# Patient Record
Sex: Female | Born: 1945 | Race: White | Hispanic: No | Smoking: Former smoker
Health system: Southern US, Community
[De-identification: ages and names within clinical notes are randomized; demographics above are authoritative.]

## PROBLEM LIST (undated history)

## (undated) DIAGNOSIS — G3184 Mild cognitive impairment, so stated: Secondary | ICD-10-CM

## (undated) DIAGNOSIS — C801 Malignant (primary) neoplasm, unspecified: Secondary | ICD-10-CM

## (undated) DIAGNOSIS — E785 Hyperlipidemia, unspecified: Secondary | ICD-10-CM

## (undated) DIAGNOSIS — I10 Essential (primary) hypertension: Secondary | ICD-10-CM

## (undated) DIAGNOSIS — F209 Schizophrenia, unspecified: Secondary | ICD-10-CM

## (undated) HISTORY — DX: Essential (primary) hypertension: I10

## (undated) HISTORY — DX: Hyperlipidemia, unspecified: E78.5

## (undated) HISTORY — DX: Mild cognitive impairment of uncertain or unknown etiology: G31.84

## (undated) HISTORY — PX: MASTECTOMY: SHX3

## (undated) HISTORY — DX: Malignant (primary) neoplasm, unspecified: C80.1

## (undated) HISTORY — DX: Schizophrenia, unspecified: F20.9

## (undated) HISTORY — PX: TOOTH EXTRACTION: SUR596

---

## 2009-04-20 HISTORY — PX: BREAST SURGERY: SHX581

## 2009-06-01 ENCOUNTER — Inpatient Hospital Stay (HOSPITAL_COMMUNITY): Admission: EM | Admit: 2009-06-01 | Discharge: 2009-06-11 | Payer: Self-pay | Admitting: Emergency Medicine

## 2009-06-01 ENCOUNTER — Encounter: Payer: Self-pay | Admitting: Cardiology

## 2009-06-01 ENCOUNTER — Ambulatory Visit: Payer: Self-pay | Admitting: Internal Medicine

## 2009-06-01 ENCOUNTER — Ambulatory Visit: Payer: Self-pay | Admitting: Emergency Medicine

## 2009-06-03 ENCOUNTER — Encounter: Payer: Self-pay | Admitting: Emergency Medicine

## 2009-06-18 ENCOUNTER — Ambulatory Visit: Payer: Self-pay | Admitting: Pulmonary Disease

## 2009-06-18 DIAGNOSIS — J9600 Acute respiratory failure, unspecified whether with hypoxia or hypercapnia: Secondary | ICD-10-CM | POA: Insufficient documentation

## 2009-06-18 DIAGNOSIS — F209 Schizophrenia, unspecified: Secondary | ICD-10-CM | POA: Insufficient documentation

## 2009-06-18 DIAGNOSIS — E119 Type 2 diabetes mellitus without complications: Secondary | ICD-10-CM | POA: Insufficient documentation

## 2009-06-18 DIAGNOSIS — R0989 Other specified symptoms and signs involving the circulatory and respiratory systems: Secondary | ICD-10-CM | POA: Insufficient documentation

## 2009-06-18 DIAGNOSIS — R0609 Other forms of dyspnea: Secondary | ICD-10-CM

## 2009-06-18 DIAGNOSIS — I219 Acute myocardial infarction, unspecified: Secondary | ICD-10-CM | POA: Insufficient documentation

## 2009-06-18 DIAGNOSIS — I10 Essential (primary) hypertension: Secondary | ICD-10-CM | POA: Insufficient documentation

## 2009-06-18 DIAGNOSIS — E785 Hyperlipidemia, unspecified: Secondary | ICD-10-CM | POA: Insufficient documentation

## 2009-06-20 ENCOUNTER — Telehealth (INDEPENDENT_AMBULATORY_CARE_PROVIDER_SITE_OTHER): Payer: Self-pay | Admitting: *Deleted

## 2009-06-28 ENCOUNTER — Encounter: Payer: Self-pay | Admitting: Cardiology

## 2009-07-01 ENCOUNTER — Encounter: Payer: Self-pay | Admitting: Emergency Medicine

## 2009-07-10 DIAGNOSIS — I5032 Chronic diastolic (congestive) heart failure: Secondary | ICD-10-CM | POA: Insufficient documentation

## 2009-07-11 ENCOUNTER — Ambulatory Visit: Payer: Self-pay | Admitting: Pulmonary Disease

## 2009-07-11 ENCOUNTER — Ambulatory Visit: Payer: Self-pay | Admitting: Cardiology

## 2009-08-26 ENCOUNTER — Telehealth (INDEPENDENT_AMBULATORY_CARE_PROVIDER_SITE_OTHER): Payer: Self-pay | Admitting: Radiology

## 2009-08-27 ENCOUNTER — Encounter (HOSPITAL_COMMUNITY): Admission: RE | Admit: 2009-08-27 | Discharge: 2009-10-18 | Payer: Self-pay | Admitting: Cardiology

## 2009-08-27 ENCOUNTER — Ambulatory Visit: Payer: Self-pay

## 2009-08-27 ENCOUNTER — Ambulatory Visit: Payer: Self-pay | Admitting: Cardiology

## 2009-09-09 ENCOUNTER — Encounter: Payer: Self-pay | Admitting: Emergency Medicine

## 2009-11-11 ENCOUNTER — Ambulatory Visit: Payer: Self-pay | Admitting: Oncology

## 2009-11-14 ENCOUNTER — Ambulatory Visit (HOSPITAL_COMMUNITY): Admission: RE | Admit: 2009-11-14 | Discharge: 2009-11-14 | Payer: Self-pay | Admitting: Radiology

## 2009-11-20 LAB — COMPREHENSIVE METABOLIC PANEL
ALT: 25 U/L (ref 0–35)
AST: 25 U/L (ref 0–37)
Albumin: 4.2 g/dL (ref 3.5–5.2)
Alkaline Phosphatase: 51 U/L (ref 39–117)
BUN: 17 mg/dL (ref 6–23)
CO2: 26 mEq/L (ref 19–32)
Calcium: 9.4 mg/dL (ref 8.4–10.5)
Chloride: 102 mEq/L (ref 96–112)
Creatinine, Ser: 0.76 mg/dL (ref 0.40–1.20)
Glucose, Bld: 100 mg/dL — ABNORMAL HIGH (ref 70–99)
Potassium: 5 mEq/L (ref 3.5–5.3)
Sodium: 139 mEq/L (ref 135–145)
Total Bilirubin: 0.3 mg/dL (ref 0.3–1.2)
Total Protein: 6.1 g/dL (ref 6.0–8.3)

## 2009-11-20 LAB — CBC WITH DIFFERENTIAL/PLATELET
BASO%: 0.3 % (ref 0.0–2.0)
Basophils Absolute: 0 10*3/uL (ref 0.0–0.1)
EOS%: 0.8 % (ref 0.0–7.0)
Eosinophils Absolute: 0 10*3/uL (ref 0.0–0.5)
HCT: 36.1 % (ref 34.8–46.6)
HGB: 12.3 g/dL (ref 11.6–15.9)
LYMPH%: 34.7 % (ref 14.0–49.7)
MCH: 30.6 pg (ref 25.1–34.0)
MCHC: 34.2 g/dL (ref 31.5–36.0)
MCV: 89.5 fL (ref 79.5–101.0)
MONO#: 0.3 10*3/uL (ref 0.1–0.9)
MONO%: 6.4 % (ref 0.0–14.0)
NEUT#: 2.8 10*3/uL (ref 1.5–6.5)
NEUT%: 57.8 % (ref 38.4–76.8)
Platelets: 190 10*3/uL (ref 145–400)
RBC: 4.03 10*6/uL (ref 3.70–5.45)
RDW: 13.2 % (ref 11.2–14.5)
WBC: 4.8 10*3/uL (ref 3.9–10.3)
lymph#: 1.7 10*3/uL (ref 0.9–3.3)

## 2009-11-20 LAB — CANCER ANTIGEN 27.29: CA 27.29: 32 U/mL (ref 0–39)

## 2009-12-06 ENCOUNTER — Telehealth (INDEPENDENT_AMBULATORY_CARE_PROVIDER_SITE_OTHER): Payer: Self-pay | Admitting: *Deleted

## 2009-12-09 ENCOUNTER — Encounter (INDEPENDENT_AMBULATORY_CARE_PROVIDER_SITE_OTHER): Payer: Self-pay | Admitting: General Surgery

## 2009-12-09 ENCOUNTER — Ambulatory Visit (HOSPITAL_COMMUNITY): Admission: RE | Admit: 2009-12-09 | Discharge: 2009-12-10 | Payer: Self-pay | Admitting: General Surgery

## 2009-12-19 ENCOUNTER — Ambulatory Visit: Payer: Self-pay | Admitting: Oncology

## 2010-03-14 ENCOUNTER — Ambulatory Visit: Payer: Self-pay | Admitting: Oncology

## 2010-03-17 LAB — CBC WITH DIFFERENTIAL/PLATELET
BASO%: 0.4 % (ref 0.0–2.0)
Basophils Absolute: 0 10*3/uL (ref 0.0–0.1)
EOS%: 1.5 % (ref 0.0–7.0)
Eosinophils Absolute: 0.1 10*3/uL (ref 0.0–0.5)
HCT: 38 % (ref 34.8–46.6)
HGB: 12.8 g/dL (ref 11.6–15.9)
LYMPH%: 43.9 % (ref 14.0–49.7)
MCH: 29.3 pg (ref 25.1–34.0)
MCHC: 33.6 g/dL (ref 31.5–36.0)
MCV: 87.1 fL (ref 79.5–101.0)
MONO#: 0.4 10*3/uL (ref 0.1–0.9)
MONO%: 6.4 % (ref 0.0–14.0)
NEUT#: 2.7 10*3/uL (ref 1.5–6.5)
NEUT%: 47.8 % (ref 38.4–76.8)
Platelets: 171 10*3/uL (ref 145–400)
RBC: 4.37 10*6/uL (ref 3.70–5.45)
RDW: 13.5 % (ref 11.2–14.5)
WBC: 5.6 10*3/uL (ref 3.9–10.3)
lymph#: 2.5 10*3/uL (ref 0.9–3.3)

## 2010-03-17 LAB — COMPREHENSIVE METABOLIC PANEL
ALT: 12 U/L (ref 0–35)
AST: 16 U/L (ref 0–37)
Albumin: 3.9 g/dL (ref 3.5–5.2)
Alkaline Phosphatase: 51 U/L (ref 39–117)
BUN: 23 mg/dL (ref 6–23)
CO2: 29 mEq/L (ref 19–32)
Calcium: 9.2 mg/dL (ref 8.4–10.5)
Chloride: 106 mEq/L (ref 96–112)
Creatinine, Ser: 0.81 mg/dL (ref 0.40–1.20)
Glucose, Bld: 113 mg/dL — ABNORMAL HIGH (ref 70–99)
Potassium: 4.6 mEq/L (ref 3.5–5.3)
Sodium: 143 mEq/L (ref 135–145)
Total Bilirubin: 0.3 mg/dL (ref 0.3–1.2)
Total Protein: 6.4 g/dL (ref 6.0–8.3)

## 2010-03-17 LAB — CANCER ANTIGEN 27.29: CA 27.29: 32 U/mL (ref 0–39)

## 2010-03-17 LAB — LACTATE DEHYDROGENASE: LDH: 156 U/L (ref 94–250)

## 2010-05-20 NOTE — Assessment & Plan Note (Signed)
Summary: per ed note from 06/17/09 EA:VWUJ HF/lg   Visit Type:  Initial Consult Referring Provider:  Richarda Overlie, MD (hospitalist) Primary Provider:  Fraser Din [facility])  CC:  NQWMI.  History of Present Illness: The patient is referred after a hospitalization for respiratory failure. She required intubation. I reviewed these hospital records. She did have an elevated BNP. An echocardiogram suggested an EF of 65% with mild LVH. Her BNP was elevated. Her troponin was elevated the CK-MB was not. She is referred now for any cardiac involvement that may have contributed to her respiratory failure.  She has schizophrenia and lives in a group home. She has not had any prior cardiac history. She is somewhat difficult to interview but reports that she does get some chest discomfort relieved by burping. She walks upstairs. She'll get slightly winded with this. She does not describe chest discomfort with this activity. She does not describe resting shortness of breath, PND or orthopnea. She has some mild dependent edema. She has a long smoking history but recently was able to quit!  Current Medications (verified): 1)  Actos 30 Mg Tabs (Pioglitazone Hcl) .... Once Daily 2)  Atenolol 50 Mg Tabs (Atenolol) .... Once Daily 3)  Glyburide 5 Mg Tabs (Glyburide) .... 2 Tablets Two Times A Day 4)  Losartan Potassium 100 Mg Tabs (Losartan Potassium) .... Once Daily 5)  Zyprexa 5 Mg Tabs (Olanzapine) .Marland Kitchen.. 1 Tablet in Morning, 3 Tablets At Bedtime 6)  Simvastatin 80 Mg Tabs (Simvastatin) .... At Bedtime 7)  Depakote 500 Mg Tbec (Divalproex Sodium) .... 3 Tablets At Bedtime 8)  Pantoprazole Sodium 40 Mg Tbec (Pantoprazole Sodium) .... Once Daily 9)  Furosemide 40 Mg Tabs (Furosemide) .... Once Daily 10)  Fexofenadine Hcl 180 Mg Tabs (Fexofenadine Hcl) .Marland Kitchen.. 1 Table Once Daily As Needed Allergies 11)  Proair Hfa 108 (90 Base) Mcg/act  Aers (Albuterol Sulfate) .... 2 Puffs Every 4-6 Hours As  Needed  Allergies (verified): No Known Drug Allergies  Past History:  Past Medical History: Reviewed history from 07/10/2009 and no changes required. 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  1ppd x 45yrs.  Quit Feb 2011 Lives in [facility]-residential care group home  Review of Systems       As stated in the HPI and negative for all other systems.   Vital Signs:  Patient profile:   65 year old female Height:      62 inches Weight:      158 pounds BMI:     29.00 Pulse rate:   70 / minute Resp:     16 per minute BP sitting:   148 / 58  (right arm)  Vitals Entered By: Marrion Coy, CNA (July 11, 2009 4:22 PM)  Physical Exam  General:  Well developed, well nourished, in no acute distress. Head:  normocephalic and atraumatic Eyes:  PERRLA/EOM intact; conjunctiva and lids normal. Mouth:  Dentures. Oral mucosa normal. Neck:  Neck supple, no JVD. No masses, thyromegaly or abnormal cervical nodes. Chest Wall:  no deformities or breast masses noted Lungs:  diffuse expiratory wheezing with decreased breath sounds bilaterally Abdomen:  Bowel sounds positive; abdomen soft and non-tender without  masses, organomegaly, or hernias noted. No hepatosplenomegaly. Msk:  Back normal, normal gait. Muscle strength and tone normal. Pulses:  pulses normal in all 4 extremities Extremities:  No clubbing or cyanosis. Neurologic:  Alert and oriented x 3. Skin:  Intact without lesions or rashes. Cervical Nodes:  no significant  adenopathy Axillary Nodes:  no significant adenopathy Inguinal Nodes:  no significant adenopathy Psych:  Normal affect.   Detailed Cardiovascular Exam  Neck    Carotids: Carotids full and equal bilaterally without bruits.      Neck Veins: Normal, no JVD.    Heart    Inspection: no deformities or lifts noted.      Palpation: normal PMI with no thrills palpable.      Auscultation: regular rate and rhythm, S1, S2 without murmurs, rubs, gallops, or clicks.    Vascular    Abdominal Aorta: no palpable masses, pulsations, or audible bruits.      Femoral Pulses: normal femoral pulses bilaterally.      Pedal Pulses: normal pedal pulses bilaterally.      Radial Pulses: normal radial pulses bilaterally.      Peripheral Circulation: no clubbing, cyanosis, or edema noted with normal capillary refill.     EKG  Procedure date:  06/01/2009  Findings:      sinus rhythm, rate 86, axis within normal limits, intervals within normal limits, poor anterior R-wave progression, no acute ST-T wave changes.  Impression & Recommendations:  Problem # 1:  DIASTOLIC HEART FAILURE, CHRONIC (ICD-428.32) I spent a long time with the patient, her sister and caregiver describing the physiology of diastolic dysfunction. I prescribed a 4 g sodium diet. No further testing is suggested.  Problem # 2:  MI (ICD-410.90) I suspect the patient's non-Q-wave myocardial infarction was related to the stress of her acute pulmonary disease. However, she has significant cardiovascular risk factors. I would like to try to get a pharmacologic perfusion study if she will allow. Orders: Nuclear Stress Test (Nuc Stress Test)  Problem # 3:  HYPERTENSION (ICD-401.9) Her blood pressure is slightly elevated. However, when checked recently outpatient it was normal. I have given instructions on how to keep a blood pressure diary. She needs good blood pressure control for management of her diastolic heart failure.  Patient  Instructions: 1)  Your physician recommends that you schedule a follow-up appointment as needed if stress test is normal. 2)  Your physician recommends that you continue on your current medications as directed. Please refer to the Current Medication list given to you today. 3)  Your physician has requested that you have an adenosine myoview.  For further information please visit [email or web address]  Please follow instruction sheet, as given.

## 2010-05-20 NOTE — Miscellaneous (Signed)
Summary: Plan of Care & Treatment/[facility]  Plan of Care & Treatment/[facility]   Imported By: Sherian Rein 09/12/2009 14:28:02  _____________________________________________________________________  External Attachment:    Type:   Image     Comment:   External Document

## 2010-05-20 NOTE — Letter (Signed)
Summary: CMN for [facility]  CMN for [facility]   Imported By: Sherian Rein 07/03/2009 13:05:57  _____________________________________________________________________  External Attachment:    Type:   Image     Comment:   External Document

## 2010-05-20 NOTE — Progress Notes (Signed)
Summary: PHYSICAL THERAPY- ORDER REQUEST  Phone Note From Other Clinic   Caller: suni- physical therapist w/ [facility] Call For: clance Summary of Call: requests verbal order for physical therapy: 1 x 1 week/ 2 x a week for 3 wks. call [phone] NOTE: i feel certain that caller said this was a pt of byrum's- but i only show kc.  Initial call taken by: Tivis Ringer, CNA,  June 20, 2009 3:54 PM  Follow-up for Phone Call        advised [facility] to contact pt's primary md for physical therapy orders Follow-up by: Philipp Deputy CMA,  June 20, 2009 4:12 PM

## 2010-05-20 NOTE — Assessment & Plan Note (Signed)
Summary: Cardiology Nuclear Study  Nuclear Med Background Indications for Stress Test: Evaluation for Ischemia, Post Hospital  Indications Comments: 2/12/11Admitted to [facility]- Resp. failure/ NSTEMI in setting of resp. failure.  History: Echo, Myocardial Infarction  History Comments: 06/01/09 NSTEMI in setting of respiratory failure; 06/03/09 Echo:EF=65%, mild LVH; h/o chronic DHF  Symptoms: Chest Tightness, DOE  Symptoms Comments: Chest tightness>(R) arm. Last episode of AO:ZHYQM in waiting room.   Nuclear Pre-Procedure Cardiac Risk Factors: Family History - CAD, History of Smoking, Hypertension, Lipids, NIDDM Caffeine/Decaff Intake: None NPO After: 8:00 PM Lungs: Decreased BS (B), no wheezes.  O2 Sat 98% on RA IV 0.9% NS with Angio Cath: 20g     IV Site: (R) FA IV Started by: Stanton Kidney EMT-P Chest Size (in) 36     Cup Size B     Height (in): 62 Weight (lb): 154 BMI: 28.27 Tech Comments: Atenolol & Diabetic Rx's held this am.  Nuclear Med Study 1 or 2 day study:  1 day     Stress Test Type:  Eugenie Birks Reading MD:  Olga Millers, MD     Referring MD:  Rollene Rotunda, MD Resting Radionuclide:  Technetium 25m Tetrofosmin     Resting Radionuclide Dose:  11.0 mCi  Stress Radionuclide:  Technetium 2m Tetrofosmin     Stress Radionuclide Dose:  33.0 mCi   Stress Protocol   Lexiscan: 0.4 mg   Stress Test Technologist:  Rea College CMA-N     Nuclear Technologist:  Domenic Polite CNMT  Rest Procedure  Myocardial perfusion imaging was performed at rest 45 minutes following the intravenous administration of Myoview Technetium 53m Tetrofosmin.  Stress Procedure  The patient received IV Lexiscan 0.4 mg over 15-seconds.  Myoview injected at 30-seconds.  There were no significant changes with lexiscan, rare PVC.  Quantitative spect images were obtained after a 45 minute delay.  QPS Raw Data Images:  Mild breast attenuation.  Normal left ventricular size. Stress Images:  There is  decreased uptake in the distal anterior wall. Rest Images:  There is decreased uptake in the distal anterior wall. Subtraction (SDS):  There is a fixed anterior defect that is most consistent with breast attenuation. Transient Ischemic Dilatation:  1.01  (Normal <1.22)  Lung/Heart Ratio:  .36  (Normal <0.45)  Quantitative Gated Spect Images QGS EDV:  59 ml QGS ESV:  11 ml QGS EF:  82 % QGS cine images:  Normal wall motion.   Overall Impression  Exercise Capacity: Lexiscan study with no exercise. BP Response: Hypertensive blood pressure response. Clinical Symptoms: No chest pain ECG Impression: No significant ST segment change suggestive of ischemia. Overall Impression: Normal lexiscan nuclear study with breast attenuation but no ischemia.  Appended Document: Cardiology Nuclear Study No evidence of ishcemia.  EF NL.  No futher cardiac work up.    Appended Document: Cardiology Nuclear Study pt aware of results

## 2010-05-20 NOTE — Progress Notes (Signed)
Summary: call Anesthesiology  Phone Note Other Incoming Call back at [phone] or [phone]   Caller: Revonda Standard - PA - Anesthesia Summary of Call: Schedulled for surgery Monday, want you to look at CXR before surgery, trying to decide if ptneeds ct before surgery.  Want to get your input. Initial call taken by: Eugene Gavia,  December 06, 2009 11:11 AM  Follow-up for Phone Call        let them know that I have looked at recent cxr, and it is suggestive of edema to me...she has had an issue with this in the recent past requiring hospitalization.  I would not do a ct chest unless you feel it is needed for preop clearance. Follow-up by: Barbaraann Share MD,  December 06, 2009 3:53 PM  Additional Follow-up for Phone Call Additional follow up Details #1::        Spoke with Mitchel Honour is aware that no CT chest is needed unless they feel the need for preop clearance.Revonda Standard verbalized she understood this.Reynaldo Minium CMA  December 06, 2009 4:02 PM

## 2010-05-20 NOTE — Progress Notes (Signed)
Summary: Nuc Pre-Procedure  Phone Note Outgoing Call Call back at [facility] Phone [phone]   Call placed by: Leonia Corona, RT-N,  Aug 26, 2009 4:12 PM Call placed to: Patient Reason for Call: Confirm/change Appt Summary of Call: Reviewed information on Myoview Information Sheet (see scanned document for further details).  Spoke with the Director, [name], of the Group Home where she lives.     Nuclear Med Background Indications for Stress Test: Evaluation for Ischemia, Post Hospital  Indications Comments: 06/01/09- Admitted to [facility]- Resp. failure/ NSTEMI in setting of resp. failure.  History: Echo, Myocardial Infarction  History Comments: Chronic DHF  Symptoms: Chest Pain, DOE    Nuclear Pre-Procedure Cardiac Risk Factors: Family History - CAD, History of Smoking, Hypertension, Lipids, NIDDM Height (in): 62

## 2010-05-20 NOTE — Cardiovascular Report (Signed)
Summary: Outpatient Coinsurance Notice   Outpatient Coinsurance Notice   Imported By: Roderic Ovens 09/02/2009 12:25:03  _____________________________________________________________________  External Attachment:    Type:   Image     Comment:   External Document

## 2010-05-20 NOTE — Assessment & Plan Note (Signed)
Summary: rov for dyspnea, followup pfts.   Copy to:  Richarda Overlie, MD (hospitalist) Primary Provider/Referring Provider:  Fraser Din [facility])  CC:  Followup with PFT's today.  Pt feels that her breathing has improved some since last seen.  Pt states that her cough has almost completely resolved.  Still has occ dry cough..  History of Present Illness: the pt comes in today for f/u after her pfts to evaluate whether she may have obstructive lung disease.  Unfortunately, she was not able to cooperate enough with technique to get any results.    Current Medications (verified): 1)  Actos 30 Mg Tabs (Pioglitazone Hcl) .... Once Daily 2)  Atenolol 50 Mg Tabs (Atenolol) .... Once Daily 3)  Glyburide 5 Mg Tabs (Glyburide) .... 2 Tablets Two Times A Day 4)  Losartan Potassium 100 Mg Tabs (Losartan Potassium) .... Once Daily 5)  Zyprexa 5 Mg Tabs (Olanzapine) .Marland Kitchen.. 1 Tablet in Morning, 3 Tablets At Bedtime 6)  Simvastatin 80 Mg Tabs (Simvastatin) .... At Bedtime 7)  Depakote 500 Mg Tbec (Divalproex Sodium) .... 3 Tablets At Bedtime 8)  Pantoprazole Sodium 40 Mg Tbec (Pantoprazole Sodium) .... Once Daily 9)  Furosemide 40 Mg Tabs (Furosemide) .... Once Daily 10)  Fexofenadine Hcl 180 Mg Tabs (Fexofenadine Hcl) .Marland Kitchen.. 1 Table Once Daily As Needed Allergies  Allergies (verified): No Known Drug Allergies  Vital Signs:  Patient profile:   65 year old female Weight:      158 pounds O2 Sat:      91 % on Room air Temp:     97.5 degrees F oral Pulse rate:   75 / minute BP sitting:   130 / 8  (left arm)  Vitals Entered By: Vernie Murders (July 11, 2009 1:51 PM)  O2 Flow:  Room air  Physical Exam  General:  ow female in nad Lungs:  clear to auscultation loud upper airway noise with transmission to lower lung fields. Heart:  rrr Extremities:  minimal edema Neurologic:  alert, moves all 4.   Impression & Recommendations:  Problem # 1:  DYSPNEA ON EXERTION (ICD-786.09) It  is unclear whether the pt has obstructive lung disease, but my suspicion is she does.  She was not able to do pfts.  I also think a lot of her sob is due to her weight, conditioning, and diastolic dysfunction.  At this point, I have stressed the need to stay away from cigarettes, and will try her on a short acting beta agonist to see how things go.  If she sees improvement, and is using consistently, would consider putting her on a long acting medication.  Again, I think the most important thing is smoking cessation.  I would be happy to see her back if having worsening pulmonary symptoms, or is requiring chronic use of bronchodilators.  Time spent with family and pt today discussing above was .  Medications Added to Medication List This Visit: 1)  Proair Hfa 108 (90 Base) Mcg/act Aers (Albuterol sulfate) .... 2 puffs every 4-6 hours as needed  Other Orders: Est. Patient Level III (62130)  Patient Instructions: 1)  use proair 2 puffs up to every 6hrs if needed for shortness of breath.  If requiring more than 2 times a day consistently, would consider putting on a long acting medication for maintenance. 2)  stay away from smoking 3)  work on conditioning.   Prescriptions: PROAIR HFA 108 (90 BASE) MCG/ACT  AERS (ALBUTEROL SULFATE) 2 puffs every 4-6 hours  as needed  #1 x 6   Entered and Authorized by:   Barbaraann Share MD   Signed by:   Barbaraann Share MD on 07/11/2009   Method used:   Print then Give to Patient   RxID:   [id number]

## 2010-05-20 NOTE — Assessment & Plan Note (Signed)
Summary: consult for possible copd   Copy to:  Richarda Overlie, MD (hospitalist) Primary Provider/Referring Provider:  Fraser Din [facility])  CC:  Pulmonary Consult for copd.Marland Kitchen  History of Present Illness: The pt is a 65y/o female who I have been asked to see for possible emphysema.  She was recently in the hospital where she had acute vent dependent respiratory failure with elevated BNP, elevated troponins, and a cxr most c/w pulmonary edema.  She was diuresed aggressively and also started on emperic abx, and had good improvement.  Echo showed normal lv function, but + diastolic dysfunction.  She did not have a w/u for coronary disease while in the hospital.  The pt still has doe with moderate or greater activity, and this was an issue even before the hospitalization.  She has a chronic cough with foamy white mucus, but smoked a ppd all of her life until her recent hospitalization.  She denies any anginal or pleuritic chest pain.    Preventive Screening-Counseling & Management  Alcohol-Tobacco     Smoking Status: quit  Current Medications (verified): 1)  Actos 30 Mg Tabs (Pioglitazone Hcl) .... Once Daily 2)  Atenolol 50 Mg Tabs (Atenolol) .... Once Daily 3)  Glyburide 5 Mg Tabs (Glyburide) .... 2 Tablets Two Times A Day 4)  Losartan Potassium 100 Mg Tabs (Losartan Potassium) .... Once Daily 5)  Zyprexa 5 Mg Tabs (Olanzapine) .Marland Kitchen.. 1 Tablet in Morning, 3 Tablets At Bedtime 6)  Simvastatin 80 Mg Tabs (Simvastatin) .... At Bedtime 7)  Depakote 500 Mg Tbec (Divalproex Sodium) .... 3 Tablets At Bedtime 8)  Pantoprazole Sodium 40 Mg Tbec (Pantoprazole Sodium) .... Once Daily 9)  Furosemide 40 Mg Tabs (Furosemide) .... Once Daily 10)  Fexofenadine Hcl 180 Mg Tabs (Fexofenadine Hcl) .Marland Kitchen.. 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 emphysema-mother heart disease-father  Social History: Reviewed history and no changes required. widowed no children Patient states former smoker.  1ppd x 63yrs.  Quit Feb 2011 Lives in [facility]-residential care group home  Smoking Status:  quit  Review of Systems       The patient complains of shortness of breath with activity and productive cough.  The patient denies shortness of breath at rest, non-productive cough, coughing up blood, chest pain, irregular heartbeats, acid heartburn, indigestion, loss of appetite, weight change, abdominal pain, difficulty swallowing, sore throat, tooth/dental problems, headaches, nasal congestion/difficulty breathing through nose, sneezing, itching, ear ache, anxiety, depression, hand/feet swelling, joint stiffness or pain, rash, change in color of mucus, and fever.    Vital Signs:  Patient profile:   65 year old female Height:      62 inches Weight:      161.38 pounds BMI:     29.62 O2 Sat:      92 % on Room air Temp:     98.0 degrees F oral Pulse rate:   74 / minute BP sitting:   108 / 64  (left arm) Cuff size:   regular  Vitals Entered By: Gweneth Dimitri RN (June 18, 2009 10:44 AM)  O2 Flow:  Room air CC: Pulmonary Consult for copd. Comments Medications reviewed with patient Daytime contact number verified with patient. Gweneth Dimitri RN  June 18, 2009 10:44 AM    Physical Exam  General:  obese female in nad Eyes:  PERRLA and EOMI.   Nose:  patent without discharge Mouth:  clear Neck:  no jvd, tmg, LN Lungs:  clear bs except upper airway noise with transmission to bases. no true wheezing Heart:  rrr, no mrg Abdomen:  soft and nontender, bs+ Extremities:  no significant edema, no cyanosis pulses intact distally Neurologic:   alert and oriented, moves all 4.   Impression & Recommendations:  Problem # 1:  DYSPNEA ON EXERTION (ICD-786.09) the pt is much improved from her recent hospitalization for acute respiratory failure related to pulmonary edema.  Her cxr on presentation clearly showed edema, and her troponins were elevated.  Her echo did not show wall motion abnl, but did show diastolic dysfunction.  She obviously needs cardiac w/u as outpt with her primary md.  The pt also clearly has issues with obesity and deconditioning which can also contribute to her dyspnea.  Finally, the question has been raised whether she may have underlying obstructive lung disease with her h/o smoking, hence the reason for her pulmonary consult today.  She has been off cigarettes since her hospitalization, and is improving.  Will need to do full pfts for assessment, and to make a decision whether she needs some type of maintenance bronchodilator therapy.  Medications Added to Medication List This Visit: 1)  Actos 30 Mg Tabs (Pioglitazone hcl) .... Once daily 2)  Atenolol 50 Mg Tabs (Atenolol) .... Once daily 3)  Glyburide 5 Mg Tabs (Glyburide) .... 2 tablets two times a day 4)  Losartan Potassium 100 Mg Tabs (Losartan potassium) .... Once daily 5)  Zyprexa 5 Mg Tabs (Olanzapine) .Marland Kitchen.. 1 tablet in morning, 3 tablets at bedtime 6)  Simvastatin 80 Mg Tabs (Simvastatin) .... At bedtime 7)  Depakote 500 Mg Tbec (Divalproex sodium) .... 3 tablets at bedtime 8)  Pantoprazole Sodium 40 Mg Tbec (Pantoprazole sodium) .... Once daily 9)  Furosemide 40 Mg Tabs (Furosemide) .... Once daily 10)  Fexofenadine Hcl 180 Mg Tabs (Fexofenadine hcl) .Marland Kitchen.. 1 table once daily as needed allergies  Other Orders: Consultation Level IV (81191) Pulmonary Referral (Pulmonary)  Patient Instructions: 1)  stay away from cigarettes 2)  will schedule for breathing tests in 3-4 weeks and see you back on same day 3)  I would recommend a cardiac workup, and will send  a note to your primary md. 4)  work on your weight loss and conditioning.   Immunization History:  Influenza Immunization History:    Influenza:  historical (12/19/2008)  Pneumovax Immunization History:    Pneumovax:  historical (01/18/2006)

## 2010-07-04 LAB — GLUCOSE, CAPILLARY
Glucose-Capillary: 165 mg/dL — ABNORMAL HIGH (ref 70–99)
Glucose-Capillary: 165 mg/dL — ABNORMAL HIGH (ref 70–99)
Glucose-Capillary: 165 mg/dL — ABNORMAL HIGH (ref 70–99)
Glucose-Capillary: 165 mg/dL — ABNORMAL HIGH (ref 70–99)
Glucose-Capillary: 165 mg/dL — ABNORMAL HIGH (ref 70–99)
Glucose-Capillary: 165 mg/dL — ABNORMAL HIGH (ref 70–99)
Glucose-Capillary: 165 mg/dL — ABNORMAL HIGH (ref 70–99)
Glucose-Capillary: 165 mg/dL — ABNORMAL HIGH (ref 70–99)
Glucose-Capillary: 165 mg/dL — ABNORMAL HIGH (ref 70–99)
Glucose-Capillary: 165 mg/dL — ABNORMAL HIGH (ref 70–99)
Glucose-Capillary: 165 mg/dL — ABNORMAL HIGH (ref 70–99)
Glucose-Capillary: 165 mg/dL — ABNORMAL HIGH (ref 70–99)
Glucose-Capillary: 165 mg/dL — ABNORMAL HIGH (ref 70–99)
Glucose-Capillary: 165 mg/dL — ABNORMAL HIGH (ref 70–99)
Glucose-Capillary: 165 mg/dL — ABNORMAL HIGH (ref 70–99)
Glucose-Capillary: 165 mg/dL — ABNORMAL HIGH (ref 70–99)
Glucose-Capillary: 165 mg/dL — ABNORMAL HIGH (ref 70–99)
Glucose-Capillary: 165 mg/dL — ABNORMAL HIGH (ref 70–99)
Glucose-Capillary: 165 mg/dL — ABNORMAL HIGH (ref 70–99)
Glucose-Capillary: 166 mg/dL — ABNORMAL HIGH (ref 70–99)
Glucose-Capillary: 170 mg/dL — ABNORMAL HIGH (ref 70–99)
Glucose-Capillary: 178 mg/dL — ABNORMAL HIGH (ref 70–99)
Glucose-Capillary: 252 mg/dL — ABNORMAL HIGH (ref 70–99)
Glucose-Capillary: 82 mg/dL (ref 70–99)

## 2010-07-04 LAB — CBC
HCT: 37.8 % (ref 36.0–46.0)
HCT: 39.3 % (ref 36.0–46.0)
Hemoglobin: 12.9 g/dL (ref 12.0–15.0)
Hemoglobin: 12.9 g/dL (ref 12.0–15.0)
MCH: 29.7 pg (ref 26.0–34.0)
MCH: 30.4 pg (ref 26.0–34.0)
MCHC: 32.8 g/dL (ref 30.0–36.0)
MCHC: 34.1 g/dL (ref 30.0–36.0)
MCV: 89.2 fL (ref 78.0–100.0)
MCV: 90.6 fL (ref 78.0–100.0)
Platelets: 159 10*3/uL (ref 150–400)
Platelets: 192 10*3/uL (ref 150–400)
RBC: 4.24 MIL/uL (ref 3.87–5.11)
RBC: 4.34 MIL/uL (ref 3.87–5.11)
RDW: 12.8 % (ref 11.5–15.5)
RDW: 12.9 % (ref 11.5–15.5)
WBC: 5.2 10*3/uL (ref 4.0–10.5)
WBC: 6.3 10*3/uL (ref 4.0–10.5)

## 2010-07-04 LAB — SURGICAL PCR SCREEN
MRSA, PCR: NEGATIVE
Staphylococcus aureus: NEGATIVE

## 2010-07-04 LAB — DIFFERENTIAL
Basophils Absolute: 0 10*3/uL (ref 0.0–0.1)
Basophils Relative: 0 % (ref 0–1)
Eosinophils Absolute: 0 10*3/uL (ref 0.0–0.7)
Eosinophils Relative: 0 % (ref 0–5)
Lymphocytes Relative: 28 % (ref 12–46)
Lymphs Abs: 1.8 10*3/uL (ref 0.7–4.0)
Monocytes Absolute: 0.5 10*3/uL (ref 0.1–1.0)
Monocytes Relative: 8 % (ref 3–12)
Neutro Abs: 4 10*3/uL (ref 1.7–7.7)
Neutrophils Relative %: 63 % (ref 43–77)

## 2010-07-04 LAB — COMPREHENSIVE METABOLIC PANEL
ALT: 23 U/L (ref 0–35)
AST: 29 U/L (ref 0–37)
Albumin: 4 g/dL (ref 3.5–5.2)
Alkaline Phosphatase: 60 U/L (ref 39–117)
BUN: 15 mg/dL (ref 6–23)
CO2: 29 mEq/L (ref 19–32)
Calcium: 9.6 mg/dL (ref 8.4–10.5)
Chloride: 101 mEq/L (ref 96–112)
Creatinine, Ser: 0.77 mg/dL (ref 0.4–1.2)
GFR calc Af Amer: >60 mL/min (ref >60)
GFR calc non Af Amer: >60 mL/min (ref >60)
Glucose, Bld: 185 mg/dL — ABNORMAL HIGH (ref 70–99)
Potassium: 4.6 mEq/L (ref 3.5–5.1)
Sodium: 137 mEq/L (ref 135–145)
Total Bilirubin: 0.5 mg/dL (ref 0.3–1.2)
Total Protein: 6.9 g/dL (ref 6.0–8.3)

## 2010-07-04 LAB — URINALYSIS, ROUTINE W REFLEX MICROSCOPIC
Bilirubin Urine: NEGATIVE
Glucose, UA: NEGATIVE mg/dL
Hgb urine dipstick: NEGATIVE
Ketones, ur: NEGATIVE mg/dL
Nitrite: NEGATIVE
Protein, ur: NEGATIVE mg/dL
Specific Gravity, Urine: 1.015 (ref 1.005–1.030)
Urobilinogen, UA: 0.2 mg/dL (ref 0.0–1.0)
pH: 6 (ref 5.0–8.0)

## 2010-07-04 LAB — HEMOGLOBIN AND HEMATOCRIT, BLOOD
HCT: 37.1 % (ref 36.0–46.0)
Hemoglobin: 12.3 g/dL (ref 12.0–15.0)

## 2010-07-04 LAB — URINE MICROSCOPIC-ADD ON

## 2010-07-10 LAB — GLUCOSE, CAPILLARY
Glucose-Capillary: 102 mg/dL — ABNORMAL HIGH (ref 70–99)
Glucose-Capillary: 118 mg/dL — ABNORMAL HIGH (ref 70–99)
Glucose-Capillary: 121 mg/dL — ABNORMAL HIGH (ref 70–99)
Glucose-Capillary: 124 mg/dL — ABNORMAL HIGH (ref 70–99)
Glucose-Capillary: 132 mg/dL — ABNORMAL HIGH (ref 70–99)
Glucose-Capillary: 133 mg/dL — ABNORMAL HIGH (ref 70–99)
Glucose-Capillary: 133 mg/dL — ABNORMAL HIGH (ref 70–99)
Glucose-Capillary: 143 mg/dL — ABNORMAL HIGH (ref 70–99)
Glucose-Capillary: 146 mg/dL — ABNORMAL HIGH (ref 70–99)
Glucose-Capillary: 147 mg/dL — ABNORMAL HIGH (ref 70–99)
Glucose-Capillary: 153 mg/dL — ABNORMAL HIGH (ref 70–99)
Glucose-Capillary: 160 mg/dL — ABNORMAL HIGH (ref 70–99)
Glucose-Capillary: 162 mg/dL — ABNORMAL HIGH (ref 70–99)
Glucose-Capillary: 165 mg/dL — ABNORMAL HIGH (ref 70–99)
Glucose-Capillary: 172 mg/dL — ABNORMAL HIGH (ref 70–99)
Glucose-Capillary: 178 mg/dL — ABNORMAL HIGH (ref 70–99)
Glucose-Capillary: 181 mg/dL — ABNORMAL HIGH (ref 70–99)
Glucose-Capillary: 186 mg/dL — ABNORMAL HIGH (ref 70–99)
Glucose-Capillary: 189 mg/dL — ABNORMAL HIGH (ref 70–99)
Glucose-Capillary: 189 mg/dL — ABNORMAL HIGH (ref 70–99)
Glucose-Capillary: 199 mg/dL — ABNORMAL HIGH (ref 70–99)
Glucose-Capillary: 201 mg/dL — ABNORMAL HIGH (ref 70–99)
Glucose-Capillary: 202 mg/dL — ABNORMAL HIGH (ref 70–99)
Glucose-Capillary: 205 mg/dL — ABNORMAL HIGH (ref 70–99)
Glucose-Capillary: 207 mg/dL — ABNORMAL HIGH (ref 70–99)
Glucose-Capillary: 214 mg/dL — ABNORMAL HIGH (ref 70–99)
Glucose-Capillary: 222 mg/dL — ABNORMAL HIGH (ref 70–99)
Glucose-Capillary: 225 mg/dL — ABNORMAL HIGH (ref 70–99)
Glucose-Capillary: 231 mg/dL — ABNORMAL HIGH (ref 70–99)
Glucose-Capillary: 233 mg/dL — ABNORMAL HIGH (ref 70–99)
Glucose-Capillary: 236 mg/dL — ABNORMAL HIGH (ref 70–99)
Glucose-Capillary: 240 mg/dL — ABNORMAL HIGH (ref 70–99)
Glucose-Capillary: 245 mg/dL — ABNORMAL HIGH (ref 70–99)
Glucose-Capillary: 251 mg/dL — ABNORMAL HIGH (ref 70–99)
Glucose-Capillary: 252 mg/dL — ABNORMAL HIGH (ref 70–99)
Glucose-Capillary: 253 mg/dL — ABNORMAL HIGH (ref 70–99)
Glucose-Capillary: 272 mg/dL — ABNORMAL HIGH (ref 70–99)
Glucose-Capillary: 282 mg/dL — ABNORMAL HIGH (ref 70–99)
Glucose-Capillary: 282 mg/dL — ABNORMAL HIGH (ref 70–99)
Glucose-Capillary: 285 mg/dL — ABNORMAL HIGH (ref 70–99)
Glucose-Capillary: 287 mg/dL — ABNORMAL HIGH (ref 70–99)
Glucose-Capillary: 290 mg/dL — ABNORMAL HIGH (ref 70–99)
Glucose-Capillary: 296 mg/dL — ABNORMAL HIGH (ref 70–99)
Glucose-Capillary: 384 mg/dL — ABNORMAL HIGH (ref 70–99)
Glucose-Capillary: 98 mg/dL (ref 70–99)

## 2010-07-10 LAB — CBC
HCT: 30.9 % — ABNORMAL LOW (ref 36.0–46.0)
HCT: 31.3 % — ABNORMAL LOW (ref 36.0–46.0)
HCT: 31.5 % — ABNORMAL LOW (ref 36.0–46.0)
HCT: 32.4 % — ABNORMAL LOW (ref 36.0–46.0)
HCT: 36.2 % (ref 36.0–46.0)
Hemoglobin: 10.8 g/dL — ABNORMAL LOW (ref 12.0–15.0)
Hemoglobin: 10.9 g/dL — ABNORMAL LOW (ref 12.0–15.0)
Hemoglobin: 11.1 g/dL — ABNORMAL LOW (ref 12.0–15.0)
Hemoglobin: 11.2 g/dL — ABNORMAL LOW (ref 12.0–15.0)
Hemoglobin: 12.2 g/dL (ref 12.0–15.0)
MCHC: 33.6 g/dL (ref 30.0–36.0)
MCHC: 34.3 g/dL (ref 30.0–36.0)
MCHC: 34.5 g/dL (ref 30.0–36.0)
MCHC: 35.1 g/dL (ref 30.0–36.0)
MCHC: 35.7 g/dL (ref 30.0–36.0)
MCV: 89.5 fL (ref 78.0–100.0)
MCV: 91.4 fL (ref 78.0–100.0)
MCV: 92.9 fL (ref 78.0–100.0)
MCV: 93.1 fL (ref 78.0–100.0)
MCV: 93.8 fL (ref 78.0–100.0)
Platelets: 193 10*3/uL (ref 150–400)
Platelets: 253 10*3/uL (ref 150–400)
Platelets: 266 10*3/uL (ref 150–400)
Platelets: 287 10*3/uL (ref 150–400)
Platelets: 323 10*3/uL (ref 150–400)
RBC: 3.32 MIL/uL — ABNORMAL LOW (ref 3.87–5.11)
RBC: 3.34 MIL/uL — ABNORMAL LOW (ref 3.87–5.11)
RBC: 3.39 MIL/uL — ABNORMAL LOW (ref 3.87–5.11)
RBC: 3.54 MIL/uL — ABNORMAL LOW (ref 3.87–5.11)
RBC: 4.05 MIL/uL (ref 3.87–5.11)
RDW: 13.5 % (ref 11.5–15.5)
RDW: 13.7 % (ref 11.5–15.5)
RDW: 13.8 % (ref 11.5–15.5)
RDW: 13.8 % (ref 11.5–15.5)
RDW: 13.9 % (ref 11.5–15.5)
WBC: 11.7 10*3/uL — ABNORMAL HIGH (ref 4.0–10.5)
WBC: 11.7 10*3/uL — ABNORMAL HIGH (ref 4.0–10.5)
WBC: 15.5 10*3/uL — ABNORMAL HIGH (ref 4.0–10.5)
WBC: 15.6 10*3/uL — ABNORMAL HIGH (ref 4.0–10.5)
WBC: 17.8 10*3/uL — ABNORMAL HIGH (ref 4.0–10.5)

## 2010-07-10 LAB — BASIC METABOLIC PANEL
BUN: 17 mg/dL (ref 6–23)
BUN: 18 mg/dL (ref 6–23)
BUN: 24 mg/dL — ABNORMAL HIGH (ref 6–23)
BUN: 30 mg/dL — ABNORMAL HIGH (ref 6–23)
BUN: 33 mg/dL — ABNORMAL HIGH (ref 6–23)
BUN: 47 mg/dL — ABNORMAL HIGH (ref 6–23)
BUN: 54 mg/dL — ABNORMAL HIGH (ref 6–23)
CO2: 31 mEq/L (ref 19–32)
CO2: 32 mEq/L (ref 19–32)
CO2: 33 mEq/L — ABNORMAL HIGH (ref 19–32)
CO2: 33 mEq/L — ABNORMAL HIGH (ref 19–32)
CO2: 35 mEq/L — ABNORMAL HIGH (ref 19–32)
CO2: 35 mEq/L — ABNORMAL HIGH (ref 19–32)
CO2: 41 mEq/L (ref 19–32)
Calcium: 8.3 mg/dL — ABNORMAL LOW (ref 8.4–10.5)
Calcium: 8.7 mg/dL (ref 8.4–10.5)
Calcium: 8.7 mg/dL (ref 8.4–10.5)
Calcium: 9 mg/dL (ref 8.4–10.5)
Calcium: 9.1 mg/dL (ref 8.4–10.5)
Calcium: 9.2 mg/dL (ref 8.4–10.5)
Calcium: 9.4 mg/dL (ref 8.4–10.5)
Chloride: 102 mEq/L (ref 96–112)
Chloride: 103 mEq/L (ref 96–112)
Chloride: 95 mEq/L — ABNORMAL LOW (ref 96–112)
Chloride: 96 mEq/L (ref 96–112)
Chloride: 98 mEq/L (ref 96–112)
Chloride: 99 mEq/L (ref 96–112)
Chloride: 99 mEq/L (ref 96–112)
Creatinine, Ser: 0.61 mg/dL (ref 0.4–1.2)
Creatinine, Ser: 0.63 mg/dL (ref 0.4–1.2)
Creatinine, Ser: 0.81 mg/dL (ref 0.4–1.2)
Creatinine, Ser: 0.83 mg/dL (ref 0.4–1.2)
Creatinine, Ser: 0.87 mg/dL (ref 0.4–1.2)
Creatinine, Ser: 0.93 mg/dL (ref 0.4–1.2)
Creatinine, Ser: 1 mg/dL (ref 0.4–1.2)
GFR calc Af Amer: >60 mL/min (ref >60)
GFR calc Af Amer: >60 mL/min (ref >60)
GFR calc Af Amer: >60 mL/min (ref >60)
GFR calc Af Amer: >60 mL/min (ref >60)
GFR calc Af Amer: >60 mL/min (ref >60)
GFR calc Af Amer: >60 mL/min (ref >60)
GFR calc Af Amer: >60 mL/min (ref >60)
GFR calc non Af Amer: 56 mL/min — ABNORMAL LOW (ref >60)
GFR calc non Af Amer: >60 mL/min (ref >60)
GFR calc non Af Amer: >60 mL/min (ref >60)
GFR calc non Af Amer: >60 mL/min (ref >60)
GFR calc non Af Amer: >60 mL/min (ref >60)
GFR calc non Af Amer: >60 mL/min (ref >60)
GFR calc non Af Amer: >60 mL/min (ref >60)
Glucose, Bld: 120 mg/dL — ABNORMAL HIGH (ref 70–99)
Glucose, Bld: 132 mg/dL — ABNORMAL HIGH (ref 70–99)
Glucose, Bld: 153 mg/dL — ABNORMAL HIGH (ref 70–99)
Glucose, Bld: 163 mg/dL — ABNORMAL HIGH (ref 70–99)
Glucose, Bld: 178 mg/dL — ABNORMAL HIGH (ref 70–99)
Glucose, Bld: 212 mg/dL — ABNORMAL HIGH (ref 70–99)
Glucose, Bld: 233 mg/dL — ABNORMAL HIGH (ref 70–99)
Potassium: 3.3 mEq/L — ABNORMAL LOW (ref 3.5–5.1)
Potassium: 3.5 mEq/L (ref 3.5–5.1)
Potassium: 3.8 mEq/L (ref 3.5–5.1)
Potassium: 3.8 mEq/L (ref 3.5–5.1)
Potassium: 3.8 mEq/L (ref 3.5–5.1)
Potassium: 3.8 mEq/L (ref 3.5–5.1)
Potassium: 4.9 mEq/L (ref 3.5–5.1)
Sodium: 138 mEq/L (ref 135–145)
Sodium: 139 mEq/L (ref 135–145)
Sodium: 139 mEq/L (ref 135–145)
Sodium: 140 mEq/L (ref 135–145)
Sodium: 141 mEq/L (ref 135–145)
Sodium: 144 mEq/L (ref 135–145)
Sodium: 145 mEq/L (ref 135–145)

## 2010-07-10 LAB — CK TOTAL AND CKMB (NOT AT ARMC)
CK, MB: 5.3 ng/mL — ABNORMAL HIGH (ref 0.3–4.0)
Relative Index: INVALID (ref 0.0–2.5)
Total CK: 65 U/L (ref 7–177)

## 2010-07-10 LAB — INFLUENZA A H1N1
Influenza A RNA: NOT DETECTED
Swine Influenza H1 Gene: NOT DETECTED

## 2010-07-10 LAB — COMPREHENSIVE METABOLIC PANEL
ALT: 32 U/L (ref 0–35)
AST: 31 U/L (ref 0–37)
Albumin: 2.6 g/dL — ABNORMAL LOW (ref 3.5–5.2)
Alkaline Phosphatase: 69 U/L (ref 39–117)
BUN: 26 mg/dL — ABNORMAL HIGH (ref 6–23)
CO2: 28 mEq/L (ref 19–32)
Calcium: 9.4 mg/dL (ref 8.4–10.5)
Chloride: 102 mEq/L (ref 96–112)
Creatinine, Ser: 0.85 mg/dL (ref 0.4–1.2)
GFR calc Af Amer: >60 mL/min (ref >60)
GFR calc non Af Amer: >60 mL/min (ref >60)
Glucose, Bld: 347 mg/dL — ABNORMAL HIGH (ref 70–99)
Potassium: 4.3 mEq/L (ref 3.5–5.1)
Sodium: 140 mEq/L (ref 135–145)
Total Bilirubin: 0.6 mg/dL (ref 0.3–1.2)
Total Protein: 6.6 g/dL (ref 6.0–8.3)

## 2010-07-10 LAB — BLOOD GAS, ARTERIAL
Acid-Base Excess: 10.2 mmol/L — ABNORMAL HIGH (ref 0.0–2.0)
Acid-Base Excess: 3.2 mmol/L — ABNORMAL HIGH (ref 0.0–2.0)
Acid-Base Excess: 3.7 mmol/L — ABNORMAL HIGH (ref 0.0–2.0)
Acid-Base Excess: 5.8 mmol/L — ABNORMAL HIGH (ref 0.0–2.0)
Acid-Base Excess: 8.3 mmol/L — ABNORMAL HIGH (ref 0.0–2.0)
Allens test (pass/fail): POSITIVE — AB
Bicarbonate: 31.1 mEq/L — ABNORMAL HIGH (ref 20.0–24.0)
Bicarbonate: 33 mEq/L — ABNORMAL HIGH (ref 20.0–24.0)
Bicarbonate: 33.4 mEq/L — ABNORMAL HIGH (ref 20.0–24.0)
Bicarbonate: 33.7 mEq/L — ABNORMAL HIGH (ref 20.0–24.0)
Bicarbonate: 34.2 mEq/L — ABNORMAL HIGH (ref 20.0–24.0)
Drawn by: 145321
Drawn by: 1453231
Drawn by: 213381
Drawn by: 326301
Drawn by: 326301
FIO2: 0.4 %
FIO2: 0.4 %
FIO2: 1 %
FIO2: 1 %
MECHVT: 0.5 mL
MECHVT: 0.5 mL
MECHVT: 500 mL
O2 Content: 8 L/min
O2 Saturation: 86.2 %
O2 Saturation: 93.9 %
O2 Saturation: 96.9 %
O2 Saturation: 97 %
O2 Saturation: 99.8 %
PEEP: 5 cmH2O
PEEP: 5 cmH2O
PEEP: 5 cmH2O
Patient temperature: 98.6
Patient temperature: 98.6
Patient temperature: 98.6
Patient temperature: 98.6
Patient temperature: 98.6
RATE: 14 resp/min
RATE: 16 resp/min
RATE: 16 resp/min
TCO2: 28.8 mmol/L (ref 0–100)
TCO2: 30.1 mmol/L (ref 0–100)
TCO2: 30.4 mmol/L (ref 0–100)
TCO2: 30.5 mmol/L (ref 0–100)
TCO2: 32.9 mmol/L (ref 0–100)
pCO2 arterial: 39.9 mmHg (ref 35.0–45.0)
pCO2 arterial: 50.5 mmHg — ABNORMAL HIGH (ref 35.0–45.0)
pCO2 arterial: 64 mmHg (ref 35.0–45.0)
pCO2 arterial: 65.1 mmHg (ref 35.0–45.0)
pCO2 arterial: 96.2 mmHg (ref 35.0–45.0)
pH, Arterial: 7.177 — CL (ref 7.350–7.400)
pH, Arterial: 7.307 — ABNORMAL LOW (ref 7.350–7.400)
pH, Arterial: 7.325 — ABNORMAL LOW (ref 7.350–7.400)
pH, Arterial: 7.436 — ABNORMAL HIGH (ref 7.350–7.400)
pH, Arterial: 7.536 — ABNORMAL HIGH (ref 7.350–7.400)
pO2, Arterial: 409 mmHg — ABNORMAL HIGH (ref 80.0–100.0)
pO2, Arterial: 63.9 mmHg — ABNORMAL LOW (ref 80.0–100.0)
pO2, Arterial: 75.7 mmHg — ABNORMAL LOW (ref 80.0–100.0)
pO2, Arterial: 77.3 mmHg — ABNORMAL LOW (ref 80.0–100.0)
pO2, Arterial: 83 mmHg (ref 80.0–100.0)

## 2010-07-10 LAB — CARDIAC PANEL(CRET KIN+CKTOT+MB+TROPI)
CK, MB: 2.3 ng/mL (ref 0.3–4.0)
CK, MB: 2.6 ng/mL (ref 0.3–4.0)
Relative Index: INVALID (ref 0.0–2.5)
Relative Index: INVALID (ref 0.0–2.5)
Total CK: 30 U/L (ref 7–177)
Total CK: 47 U/L (ref 7–177)
Troponin I: 0.25 ng/mL — ABNORMAL HIGH (ref 0.00–0.06)
Troponin I: 0.35 ng/mL — ABNORMAL HIGH (ref 0.00–0.06)

## 2010-07-10 LAB — URINALYSIS, ROUTINE W REFLEX MICROSCOPIC
Glucose, UA: NEGATIVE mg/dL
Nitrite: NEGATIVE
Protein, ur: 30 mg/dL — AB
Specific Gravity, Urine: 1.028 (ref 1.005–1.030)
Urobilinogen, UA: 1 mg/dL (ref 0.0–1.0)
pH: 6 (ref 5.0–8.0)

## 2010-07-10 LAB — TROPONIN I: Troponin I: 0.62 ng/mL (ref 0.00–0.06)

## 2010-07-10 LAB — URINE MICROSCOPIC-ADD ON

## 2010-07-10 LAB — CULTURE, BLOOD (ROUTINE X 2)
Culture: NO GROWTH
Culture: NO GROWTH

## 2010-07-10 LAB — DIFFERENTIAL
Basophils Absolute: 0.1 10*3/uL (ref 0.0–0.1)
Basophils Relative: 1 % (ref 0–1)
Eosinophils Absolute: 0 10*3/uL (ref 0.0–0.7)
Eosinophils Relative: 0 % (ref 0–5)
Lymphocytes Relative: 8 % — ABNORMAL LOW (ref 12–46)
Lymphs Abs: 0.9 10*3/uL (ref 0.7–4.0)
Monocytes Absolute: 1.2 10*3/uL — ABNORMAL HIGH (ref 0.1–1.0)
Monocytes Relative: 11 % (ref 3–12)
Neutro Abs: 9.5 10*3/uL — ABNORMAL HIGH (ref 1.7–7.7)
Neutrophils Relative %: 81 % — ABNORMAL HIGH (ref 43–77)

## 2010-07-10 LAB — STREP PNEUMONIAE URINARY ANTIGEN: Strep Pneumo Urinary Antigen: NEGATIVE

## 2010-07-10 LAB — SODIUM, URINE, RANDOM: Sodium, Ur: 9 mEq/L

## 2010-07-10 LAB — POCT I-STAT, CHEM 8
BUN: 26 mg/dL — ABNORMAL HIGH (ref 6–23)
Calcium, Ion: 1.28 mmol/L (ref 1.12–1.32)
Chloride: 105 mEq/L (ref 96–112)
Creatinine, Ser: 0.7 mg/dL (ref 0.4–1.2)
Glucose, Bld: 347 mg/dL — ABNORMAL HIGH (ref 70–99)
HCT: 36 % (ref 36.0–46.0)
Hemoglobin: 12.2 g/dL (ref 12.0–15.0)
Potassium: 4.3 mEq/L (ref 3.5–5.1)
Sodium: 141 mEq/L (ref 135–145)
TCO2: 31 mmol/L (ref 0–100)

## 2010-07-10 LAB — LEGIONELLA ANTIGEN, URINE: Legionella Antigen, Urine: NEGATIVE

## 2010-07-10 LAB — BRAIN NATRIURETIC PEPTIDE
Pro B Natriuretic peptide (BNP): 1150 pg/mL — ABNORMAL HIGH (ref 0.0–100.0)
Pro B Natriuretic peptide (BNP): 53 pg/mL (ref 0.0–100.0)
Pro B Natriuretic peptide (BNP): 977 pg/mL — ABNORMAL HIGH (ref 0.0–100.0)

## 2010-07-10 LAB — D-DIMER, QUANTITATIVE: D-Dimer, Quant: 2.13 ug/mL-FEU — ABNORMAL HIGH (ref 0.00–0.48)

## 2010-07-10 LAB — GRAM STAIN

## 2010-07-10 LAB — POCT CARDIAC MARKERS
CKMB, poc: 7.6 ng/mL (ref 1.0–8.0)
Myoglobin, poc: 116 ng/mL (ref 12–200)
Troponin i, poc: 0.3 ng/mL (ref 0.00–0.09)

## 2010-07-10 LAB — OSMOLALITY, URINE: Osmolality, Ur: 507 mOsm/kg (ref 390–1090)

## 2010-07-10 LAB — MRSA PCR SCREENING: MRSA by PCR: NEGATIVE

## 2010-07-10 LAB — LACTIC ACID, PLASMA: Lactic Acid, Venous: 2 mmol/L (ref 0.5–2.2)

## 2011-02-15 IMAGING — CR DG CHEST 1V PORT
1 series · 1 of 1 positions shown · non-contrast
Comparison: None.

CLINICAL DATA: Shortness of breath

PORTABLE CHEST - 1 VIEW

[view not recorded]
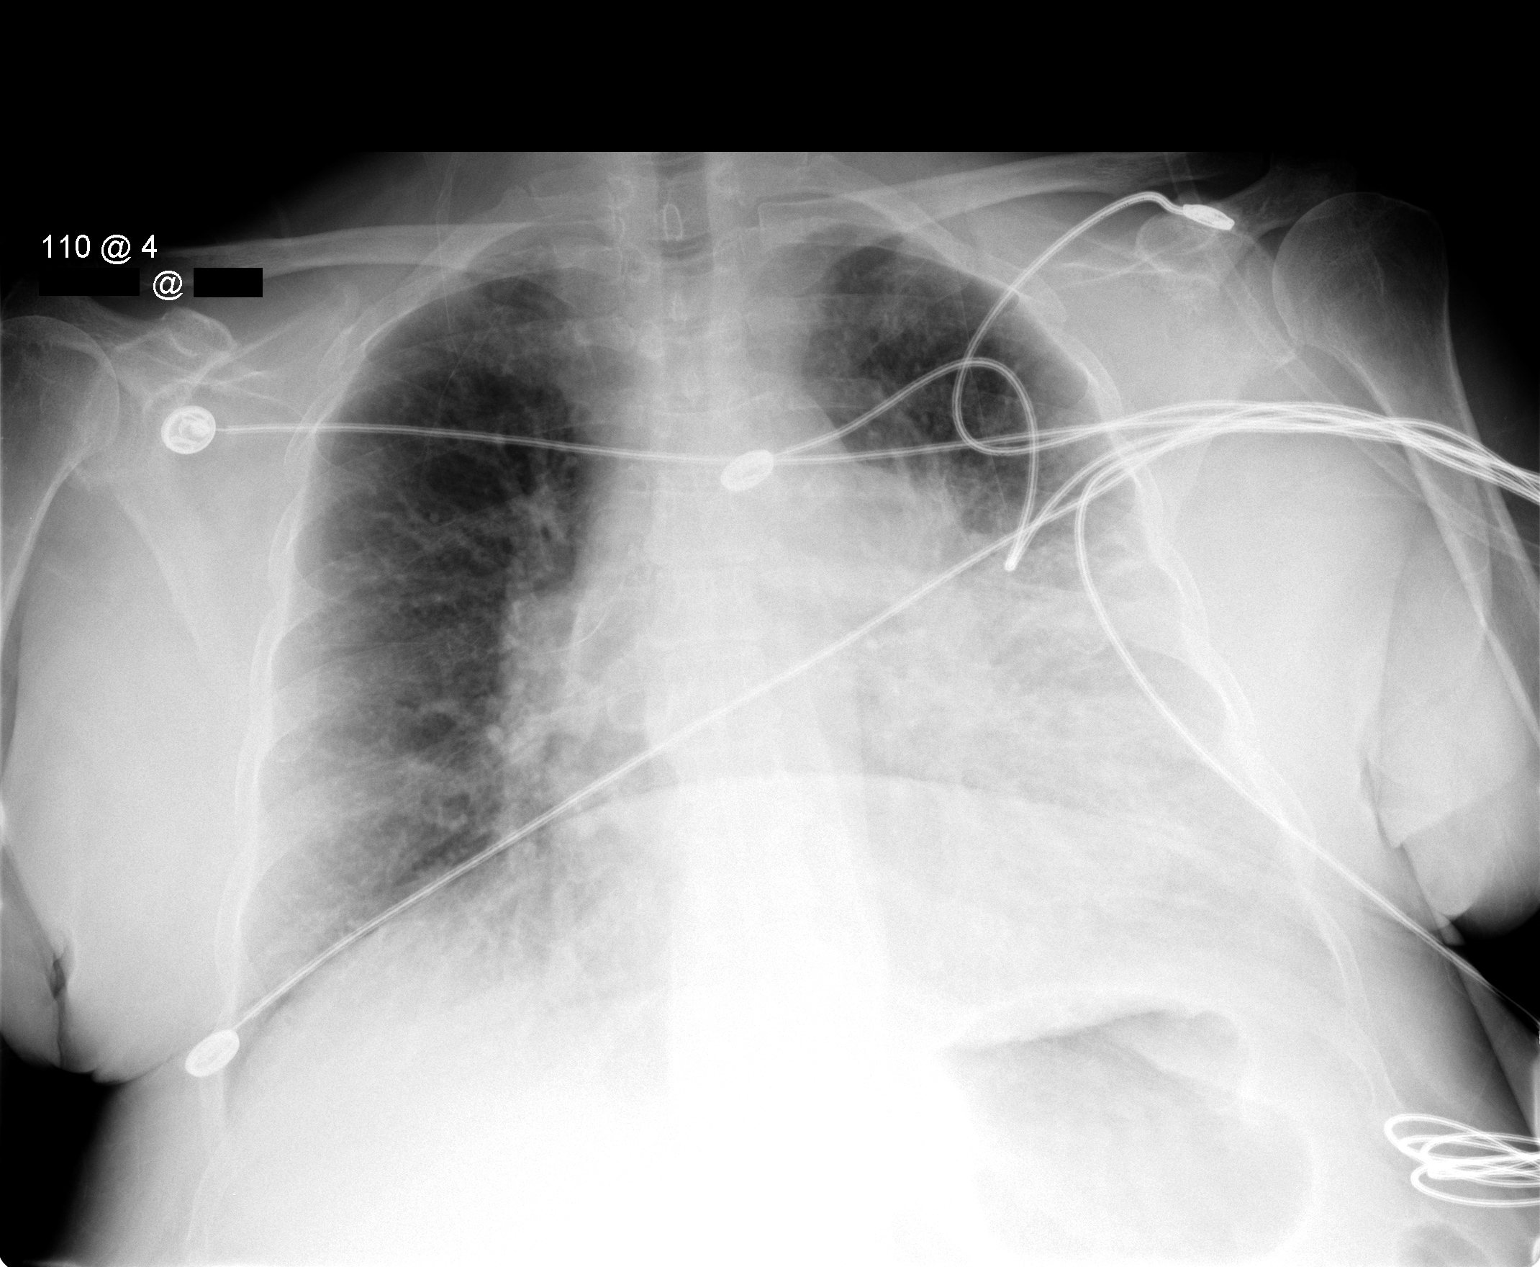

[1 of 1 positions shown; findings below may reference images not displayed]

FINDINGS: There is cardiomegaly present.  There are somewhat
prominent markings throughout the lungs and mild interstitial edema
is a consideration.  No focal infiltrate is seen.  No bony
abnormality is noted.
IMPRESSION: Cardiomegaly.  Question mild interstitial edema.

## 2011-02-15 IMAGING — CR DG CHEST 1V PORT
1 series · 1 of 1 positions shown · non-contrast
Comparison: 06/01/2009 at [DATE] p.m.

CLINICAL DATA: Intubation.  Congestive heart failure.  Respiratory
failure.

PORTABLE CHEST - 1 VIEW

[view not recorded]
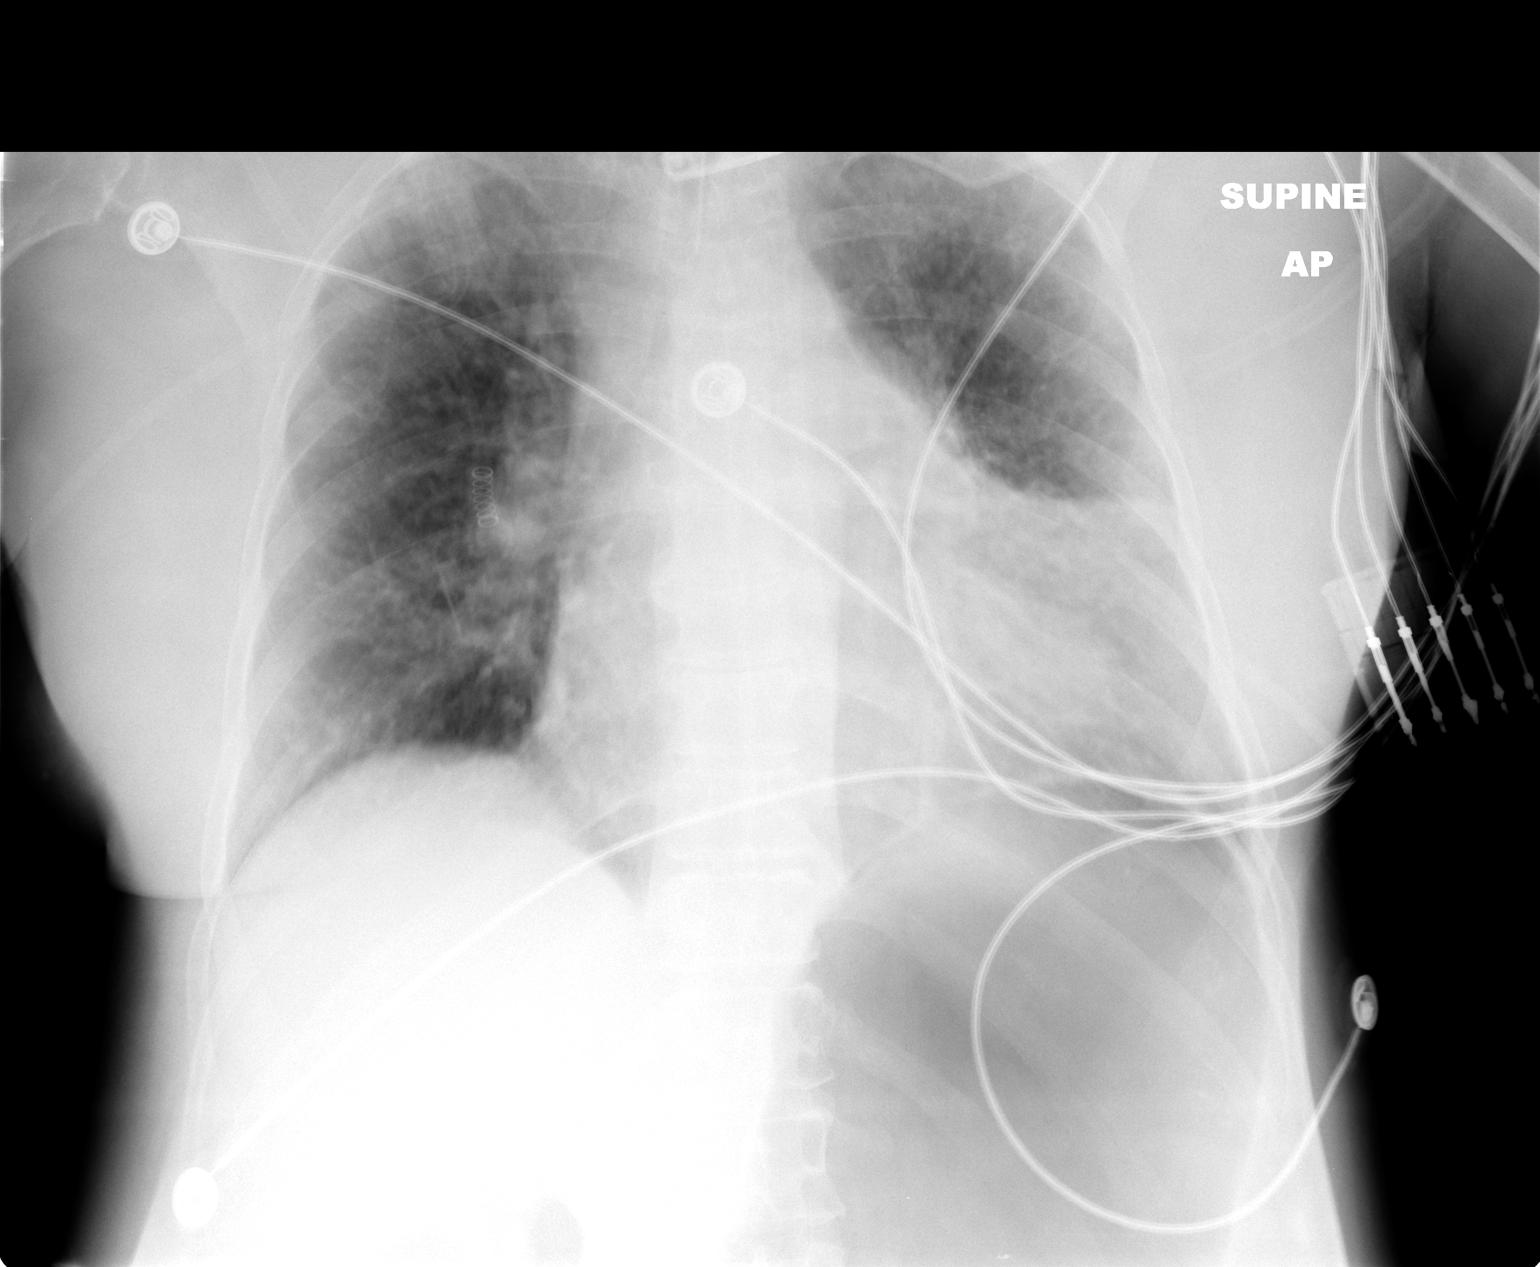

[1 of 1 positions shown; findings below may reference images not displayed]

FINDINGS: The endotracheal tube is satisfactorily positioned with
tip 4 cm above the carina.

There continues to be indistinctness of the left heart border
potentially representing airspace opacity in the lingula.

Mild interstitial prominence is present bilaterally.  The stomach
appears dilated, consider nasogastric tube placement.
IMPRESSION: 1.  Endotracheal tube is satisfactorily positioned with tip 4 cm
above the carina.
2.  Bilateral interstitial prominence with possible airspace
opacity in the lingula.
3.  Distended stomach - consider nasogastric tube placement.

## 2011-02-16 IMAGING — CR DG CHEST 1V PORT
1 series · 1 of 1 positions shown · non-contrast
Comparison: 06/01/2009

CLINICAL DATA: CHF, respiratory failure.

PORTABLE CHEST - 1 VIEW

[view not recorded]
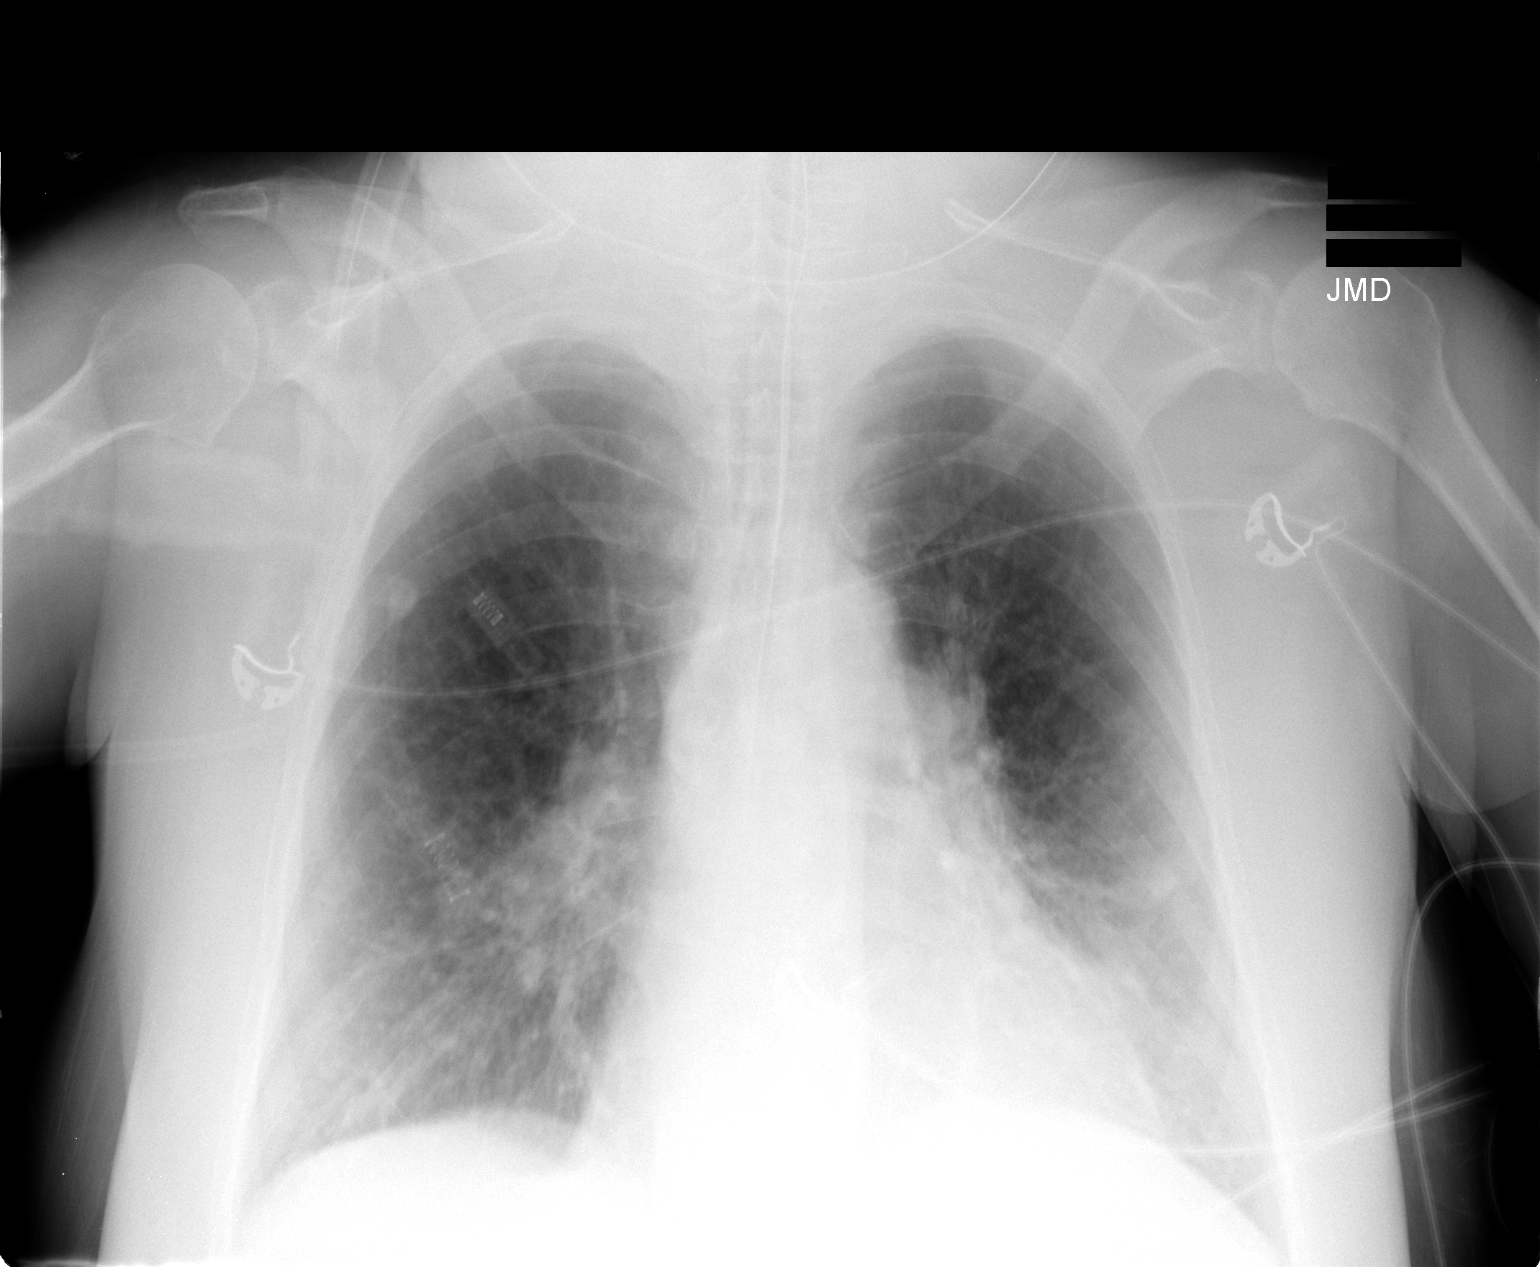

[1 of 1 positions shown; findings below may reference images not displayed]

FINDINGS: Interval placement of NG tube.  Endotracheal tube is
unchanged.

There is cardiomegaly with vascular congestion.  Stable slight
interstitial prominence.  No confluent opacities or effusions.
IMPRESSION: Stable exam.

## 2011-02-17 IMAGING — CR DG CHEST 1V PORT
1 series · 1 of 1 positions shown · non-contrast
Comparison: 06/02/2009.

CLINICAL DATA: Congestive heart failure.  Respiratory failure.

PORTABLE CHEST - 1 VIEW

[view not recorded]
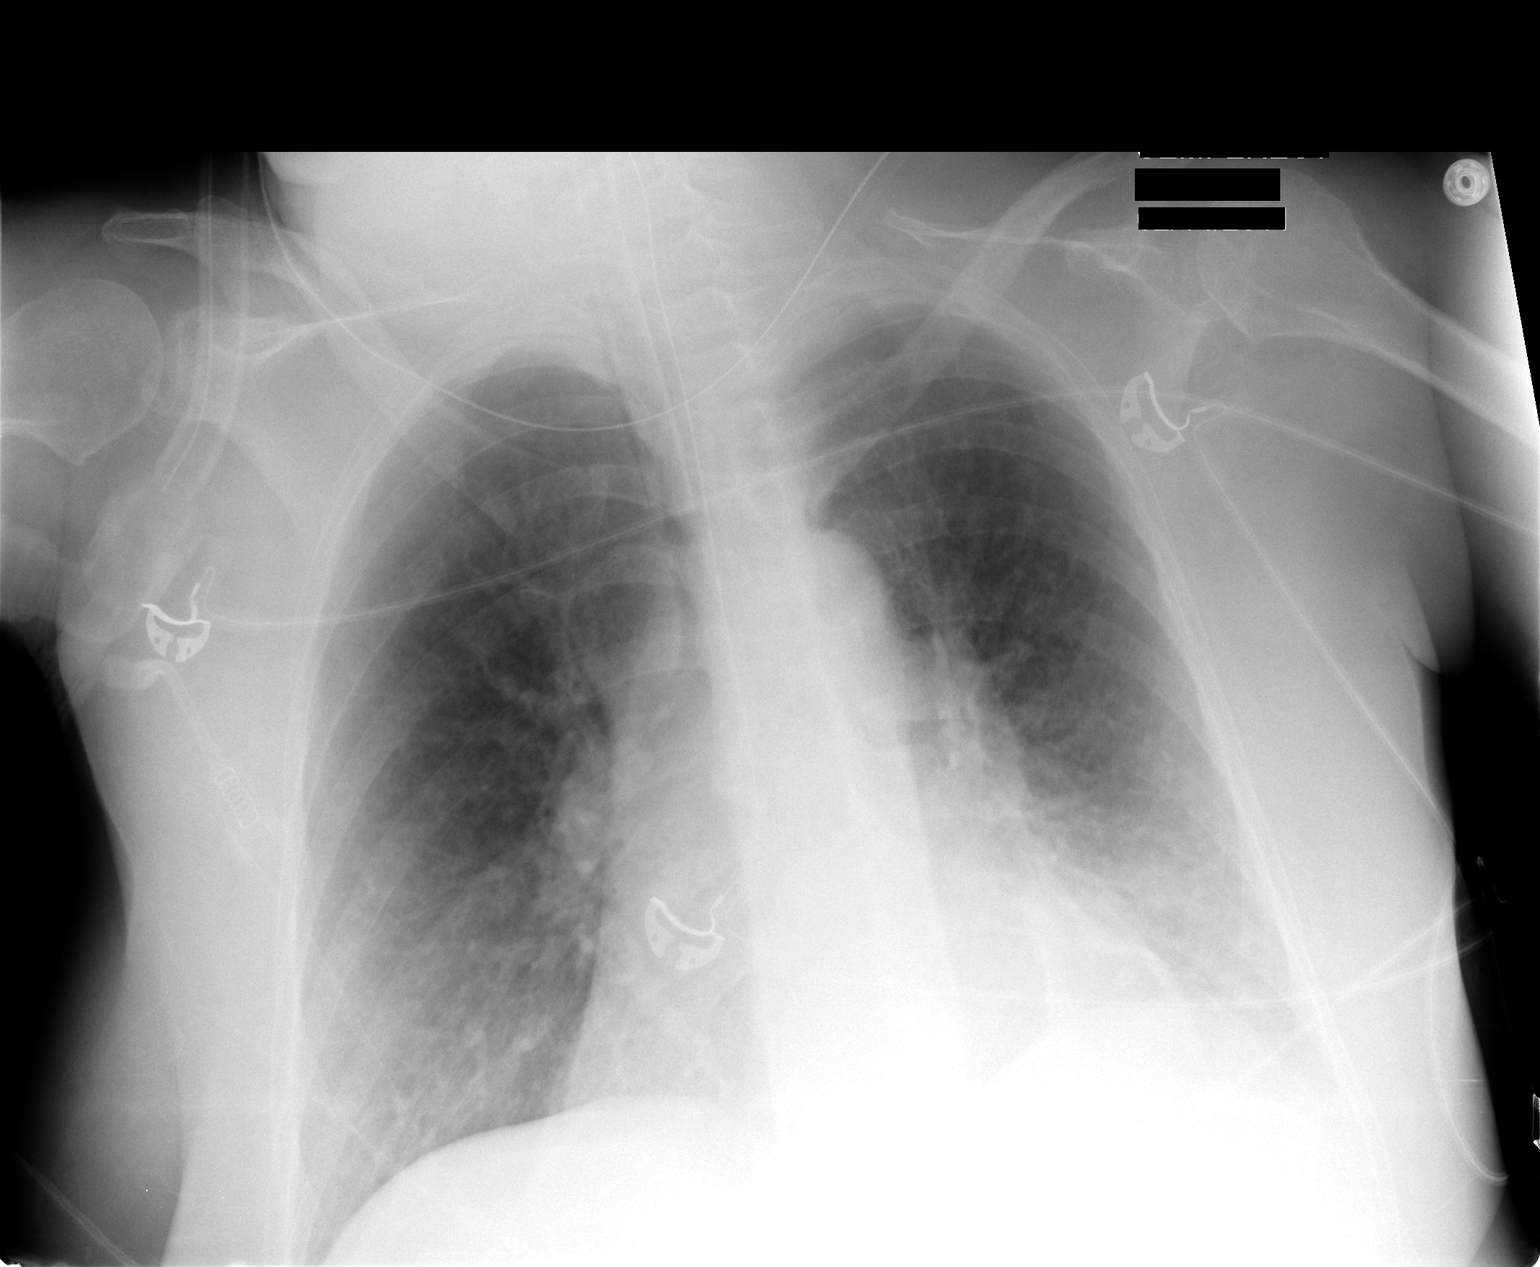

[1 of 1 positions shown; findings below may reference images not displayed]

FINDINGS: Endotracheal tube and nasogastric tube are present.  Tip
of the nasogastric tube is not seen.  Endotracheal tube tip is at
the level of the clavicles.  Left greater than right bibasilar
faint airspace opacity.  Cardiopericardial silhouette is mildly
enlarged.
IMPRESSION: 1.  Support apparatus in good position.
2.  Faint left greater than right basilar airspace opacity.  Based
on the time interval, favor asymmetric pulmonary edema over
infection.

## 2011-02-18 IMAGING — CR DG CHEST 1V PORT
1 series · 1 of 1 positions shown · non-contrast
Comparison: 06/03/2009.

CLINICAL DATA: History of congestive heart failure and respiratory
failure.  History of tube removal.

PORTABLE CHEST - 1 VIEW

[view not recorded]
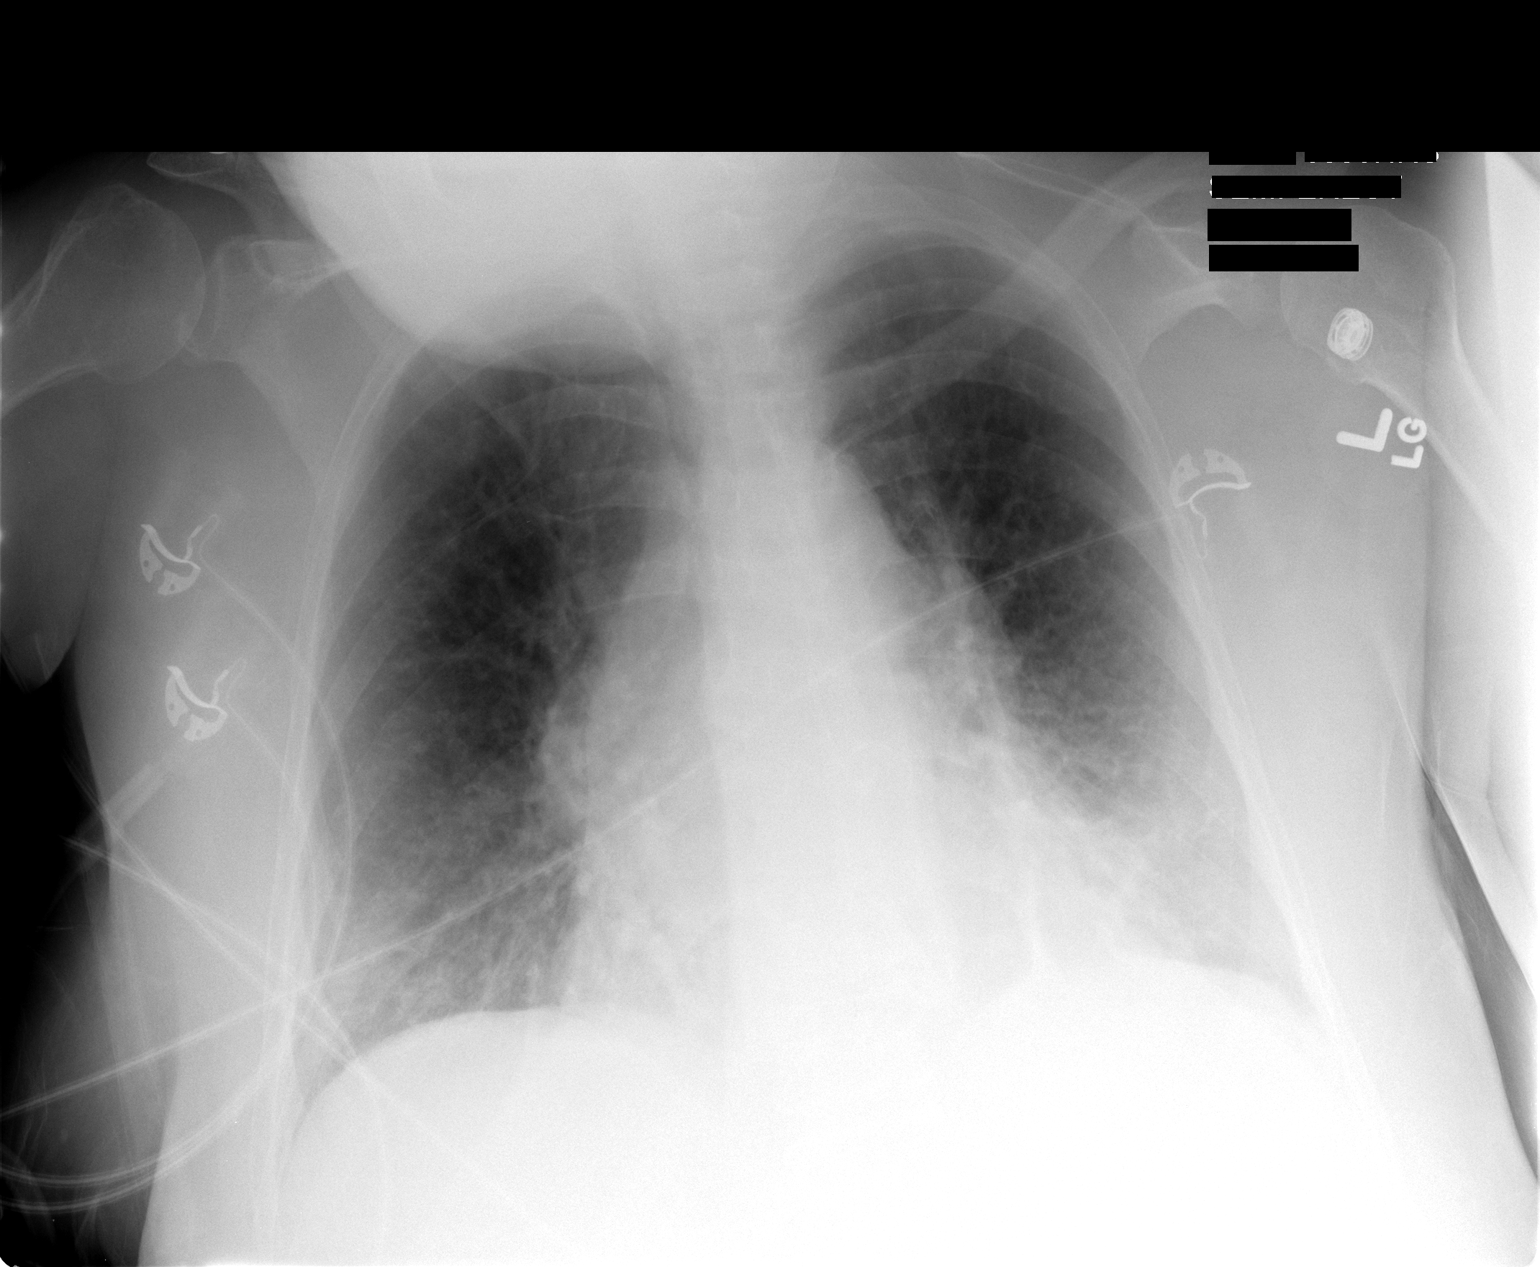

[1 of 1 positions shown; findings below may reference images not displayed]

FINDINGS: There has been interval removal of the endotracheal tube
and the enteric tube.  There is stable moderate enlargement of the
cardiac silhouette.  There is slight elevation of the left
hemidiaphragm with minimal atelectasis and infiltrative density in
the left base unchanged.  There is minimal atelectasis in the right
base.  No pleural effusion is evident. There is a mildly osteopenic
appearance of the bones.
IMPRESSION: There has been interval removal of endotracheal tube and enteric
tube. The cardiac silhouette is moderately enlarged. Minimal
basilar atelectasis left greater than right with hazy infiltrative
density is stable.  No consolidation or pleural effusion is seen.

## 2011-03-17 ENCOUNTER — Telehealth: Payer: Self-pay | Admitting: *Deleted

## 2011-03-17 NOTE — Telephone Encounter (Signed)
patient called in and requested to move the lab appointment to 04-02-2011 at 9:00

## 2011-04-01 ENCOUNTER — Other Ambulatory Visit: Payer: Self-pay | Admitting: Lab

## 2011-04-02 ENCOUNTER — Other Ambulatory Visit (HOSPITAL_BASED_OUTPATIENT_CLINIC_OR_DEPARTMENT_OTHER): Payer: Medicare Other | Admitting: Lab

## 2011-04-02 ENCOUNTER — Other Ambulatory Visit: Payer: Self-pay | Admitting: Oncology

## 2011-04-02 ENCOUNTER — Other Ambulatory Visit: Payer: Self-pay | Admitting: *Deleted

## 2011-04-02 DIAGNOSIS — E119 Type 2 diabetes mellitus without complications: Secondary | ICD-10-CM

## 2011-04-02 DIAGNOSIS — I10 Essential (primary) hypertension: Secondary | ICD-10-CM

## 2011-04-02 DIAGNOSIS — C50319 Malignant neoplasm of lower-inner quadrant of unspecified female breast: Secondary | ICD-10-CM

## 2011-04-02 DIAGNOSIS — E559 Vitamin D deficiency, unspecified: Secondary | ICD-10-CM | POA: Insufficient documentation

## 2011-04-02 LAB — COMPREHENSIVE METABOLIC PANEL
ALT: 12 U/L (ref 0–35)
AST: 20 U/L (ref 0–37)
Albumin: 3.9 g/dL (ref 3.5–5.2)
Alkaline Phosphatase: 50 U/L (ref 39–117)
BUN: 18 mg/dL (ref 6–23)
CO2: 28 mEq/L (ref 19–32)
Calcium: 9.8 mg/dL (ref 8.4–10.5)
Chloride: 105 mEq/L (ref 96–112)
Creatinine, Ser: 0.8 mg/dL (ref 0.50–1.10)
Glucose, Bld: 135 mg/dL — ABNORMAL HIGH (ref 70–99)
Potassium: 4.4 mEq/L (ref 3.5–5.3)
Sodium: 142 mEq/L (ref 135–145)
Total Bilirubin: 0.4 mg/dL (ref 0.3–1.2)
Total Protein: 5.9 g/dL — ABNORMAL LOW (ref 6.0–8.3)

## 2011-04-02 LAB — CBC WITH DIFFERENTIAL/PLATELET
BASO%: 0.4 % (ref 0.0–2.0)
Basophils Absolute: 0 10*3/uL (ref 0.0–0.1)
EOS%: 1.2 % (ref 0.0–7.0)
Eosinophils Absolute: 0.1 10*3/uL (ref 0.0–0.5)
HCT: 38.8 % (ref 34.8–46.6)
HGB: 13.3 g/dL (ref 11.6–15.9)
LYMPH%: 34.9 % (ref 14.0–49.7)
MCH: 30.5 pg (ref 25.1–34.0)
MCHC: 34.4 g/dL (ref 31.5–36.0)
MCV: 88.8 fL (ref 79.5–101.0)
MONO#: 0.3 10*3/uL (ref 0.1–0.9)
MONO%: 6.3 % (ref 0.0–14.0)
NEUT#: 2.8 10*3/uL (ref 1.5–6.5)
NEUT%: 57.2 % (ref 38.4–76.8)
Platelets: 168 10*3/uL (ref 145–400)
RBC: 4.37 10*6/uL (ref 3.70–5.45)
RDW: 12.5 % (ref 11.2–14.5)
WBC: 4.9 10*3/uL (ref 3.9–10.3)
lymph#: 1.7 10*3/uL (ref 0.9–3.3)

## 2011-04-02 LAB — CANCER ANTIGEN 27.29: CA 27.29: 34 U/mL (ref 0–39)

## 2011-04-02 LAB — VITAMIN D 25 HYDROXY (VIT D DEFICIENCY, FRACTURES): Vit D, 25-Hydroxy: 64 ng/mL (ref 30–89)

## 2011-04-07 ENCOUNTER — Encounter: Payer: Self-pay | Admitting: Oncology

## 2011-04-08 ENCOUNTER — Telehealth: Payer: Self-pay | Admitting: *Deleted

## 2011-04-08 ENCOUNTER — Ambulatory Visit (HOSPITAL_BASED_OUTPATIENT_CLINIC_OR_DEPARTMENT_OTHER): Payer: Medicare Other | Admitting: Oncology

## 2011-04-08 DIAGNOSIS — C50919 Malignant neoplasm of unspecified site of unspecified female breast: Secondary | ICD-10-CM

## 2011-04-08 DIAGNOSIS — E559 Vitamin D deficiency, unspecified: Secondary | ICD-10-CM

## 2011-04-08 NOTE — Telephone Encounter (Signed)
gave patient appointment for 09-2011 printed out calendar and gave to the patient 

## 2011-04-08 NOTE — Progress Notes (Signed)
Hematology and Oncology Follow Up Visit  Alexandra Lamb [id number] 1946/01/26 65 y.o. 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  Interim History:  There have been no intercurrent illness, hospitalizations or medication changes.  Medications: I have reviewed the patient's current medications.  Allergies: Not on File  Past Medical History, Surgical history, Social history, and Family History were reviewed and updated.  Review of Systems: Constitutional:  Negative for fever, chills, night sweats, anorexia, weight loss, pain. Cardiovascular: no chest pain or dyspnea on exertion Respiratory: no cough, shortness of breath, or wheezing Neurological: negative Dermatological: negative ENT: negative Skin Gastrointestinal: no abdominal pain, change in bowel habits, or black or bloody stools Genito-Urinary: no dysuria, trouble voiding, or hematuria Hematological and Lymphatic: negative Breast: negative for breast lumps Musculoskeletal: negative Remaining ROS negative.  Physical Exam: Blood pressure 165/76, pulse 72, temperature 97.5 F (36.4 C), height 5\' 2"  (1.575 m), weight 164 lb (74.39 kg). ECOG:  General appearance: alert, cooperative and appears stated age Head: Normocephalic, without obvious abnormality, atraumatic Neck: no adenopathy, no carotid bruit, no JVD, supple, symmetrical, trachea midline and thyroid not enlarged, symmetric, no tenderness/mass/nodules Lymph nodes: Cervical, supraclavicular, and axillary nodes normal. Cardiac : regular rate and rhythm, no murmurs or gallops Pulmonary:clear to auscultation bilaterally and normal percussion bilaterally Breasts: inspection negative, no nipple discharge or bleeding, no masses or nodularity palpable Abdomen:soft, non-tender; bowel sounds normal; no masses,  no organomegaly Extremities negative Neuro: alert, oriented, normal  speech, no focal findings or movement disorder noted  Lab Results: Lab Results  Component Value Date   WBC 4.9 04/02/2011   HGB 13.3 04/02/2011   HCT 38.8 04/02/2011   MCV 88.8 04/02/2011   PLT 168 04/02/2011     Chemistry      Component Value Date/Time   NA 142 04/02/2011 0920   NA 142 04/02/2011 0920   NA 142 04/02/2011 0920   K 4.4 04/02/2011 0920   K 4.4 04/02/2011 0920   K 4.4 04/02/2011 0920   CL 105 04/02/2011 0920   CL 105 04/02/2011 0920   CL 105 04/02/2011 0920   CO2 28 04/02/2011 0920   CO2 28 04/02/2011 0920   CO2 28 04/02/2011 0920   BUN 18 04/02/2011 0920   BUN 18 04/02/2011 0920   BUN 18 04/02/2011 0920   CREATININE 0.80 04/02/2011 0920   CREATININE 0.80 04/02/2011 0920   CREATININE 0.80 04/02/2011 0920      Component Value Date/Time   CALCIUM 9.8 04/02/2011 0920   CALCIUM 9.8 04/02/2011 0920   CALCIUM 9.8 04/02/2011 0920   ALKPHOS 50 04/02/2011 0920   ALKPHOS 50 04/02/2011 0920   ALKPHOS 50 04/02/2011 0920   AST 20 04/02/2011 0920   AST 20 04/02/2011 0920   AST 20 04/02/2011 0920   ALT 12 04/02/2011 0920   ALT 12 04/02/2011 0920   ALT 12 04/02/2011 0920   BILITOT 0.4 04/02/2011 0920   BILITOT 0.4 04/02/2011 0920   BILITOT 0.4 04/02/2011 0920      .pathology. Radiological Studies: chest X-ray n/a Mammogram n/a Bone density n/a  Impression and Plan: Pt is doing well, no clinical evidence for recurrence, f/u 6 months.  More than 50% of the visit was spent in patient-related counselling   Pierce Crane, MD 12/19/201212:59 PM

## 2011-04-23 DIAGNOSIS — J069 Acute upper respiratory infection, unspecified: Secondary | ICD-10-CM | POA: Diagnosis not present

## 2011-04-23 DIAGNOSIS — F200 Paranoid schizophrenia: Secondary | ICD-10-CM | POA: Diagnosis not present

## 2011-05-07 DIAGNOSIS — E785 Hyperlipidemia, unspecified: Secondary | ICD-10-CM | POA: Diagnosis not present

## 2011-05-07 DIAGNOSIS — I10 Essential (primary) hypertension: Secondary | ICD-10-CM | POA: Diagnosis not present

## 2011-05-07 DIAGNOSIS — N182 Chronic kidney disease, stage 2 (mild): Secondary | ICD-10-CM | POA: Diagnosis not present

## 2011-05-07 DIAGNOSIS — E1129 Type 2 diabetes mellitus with other diabetic kidney complication: Secondary | ICD-10-CM | POA: Diagnosis not present

## 2011-05-11 ENCOUNTER — Other Ambulatory Visit: Payer: Self-pay

## 2011-05-11 DIAGNOSIS — C50919 Malignant neoplasm of unspecified site of unspecified female breast: Secondary | ICD-10-CM

## 2011-05-11 DIAGNOSIS — E559 Vitamin D deficiency, unspecified: Secondary | ICD-10-CM

## 2011-05-11 MED ORDER — TAMOXIFEN CITRATE 20 MG PO TABS: 20.0000 mg | ORAL_TABLET | Freq: Every day | ORAL | Status: DC

## 2011-05-14 ENCOUNTER — Ambulatory Visit (INDEPENDENT_AMBULATORY_CARE_PROVIDER_SITE_OTHER): Payer: Medicare Other | Admitting: General Surgery

## 2011-05-14 ENCOUNTER — Encounter (INDEPENDENT_AMBULATORY_CARE_PROVIDER_SITE_OTHER): Payer: Self-pay | Admitting: General Surgery

## 2011-05-14 VITALS — BP 132/88 | HR 72 | Temp 97.6°F | Resp 24 | Ht 62.0 in | Wt 162.8 lb

## 2011-05-14 DIAGNOSIS — C50919 Malignant neoplasm of unspecified site of unspecified female breast: Secondary | ICD-10-CM

## 2011-05-14 DIAGNOSIS — C50412 Malignant neoplasm of upper-outer quadrant of left female breast: Secondary | ICD-10-CM | POA: Insufficient documentation

## 2011-05-14 DIAGNOSIS — C50912 Malignant neoplasm of unspecified site of left female breast: Secondary | ICD-10-CM

## 2011-05-14 NOTE — Progress Notes (Signed)
Chief complaint: Followup breast cancer  History: Patient returns for followup for left breast cancer T2 N0 status post left total mastectomy and sentinel lymph node biopsy August of 2011. She is on adjuvant tamoxifen. She reports no problems. Specifically no breast lump skin change or arm swelling. She had a mammogram at [facility] in July. We will obtain this. She states was negative.  Exam: Gen.: Alert no distress Skin: Warm and dry no rash or infection Nose: No palpable cervical, supraclavicular or axillary nodes Breasts: Well-healed mastectomy on the left with no chest wall or skin changes. Normal edema. Right breast is without masses skin changes.  Assessment plan: Doing well following treatment of left breast cancer with no evidence of recurrence or new cancer. Return in 6 months.

## 2011-05-18 ENCOUNTER — Other Ambulatory Visit: Payer: Self-pay

## 2011-05-18 DIAGNOSIS — C50919 Malignant neoplasm of unspecified site of unspecified female breast: Secondary | ICD-10-CM

## 2011-05-18 MED ORDER — CALCIUM CARB-CHOLECALCIFEROL 600-400 MG-UNIT PO TABS: 1.0000 | ORAL_TABLET | Freq: Two times a day (BID) | ORAL | Status: DC

## 2011-05-20 DIAGNOSIS — F200 Paranoid schizophrenia: Secondary | ICD-10-CM | POA: Diagnosis not present

## 2011-07-01 ENCOUNTER — Encounter (INDEPENDENT_AMBULATORY_CARE_PROVIDER_SITE_OTHER): Payer: Self-pay

## 2011-07-02 DIAGNOSIS — F200 Paranoid schizophrenia: Secondary | ICD-10-CM | POA: Diagnosis not present

## 2011-07-06 DIAGNOSIS — F200 Paranoid schizophrenia: Secondary | ICD-10-CM | POA: Diagnosis not present

## 2011-07-13 DIAGNOSIS — N3000 Acute cystitis without hematuria: Secondary | ICD-10-CM | POA: Diagnosis not present

## 2011-07-13 DIAGNOSIS — B373 Candidiasis of vulva and vagina: Secondary | ICD-10-CM | POA: Diagnosis not present

## 2011-07-13 DIAGNOSIS — Z124 Encounter for screening for malignant neoplasm of cervix: Secondary | ICD-10-CM | POA: Diagnosis not present

## 2011-07-30 DIAGNOSIS — F200 Paranoid schizophrenia: Secondary | ICD-10-CM | POA: Diagnosis not present

## 2011-08-27 DIAGNOSIS — F200 Paranoid schizophrenia: Secondary | ICD-10-CM | POA: Diagnosis not present

## 2011-09-28 ENCOUNTER — Other Ambulatory Visit: Payer: Self-pay | Admitting: *Deleted

## 2011-09-29 ENCOUNTER — Other Ambulatory Visit: Payer: Self-pay | Admitting: *Deleted

## 2011-09-29 DIAGNOSIS — C50319 Malignant neoplasm of lower-inner quadrant of unspecified female breast: Secondary | ICD-10-CM

## 2011-09-29 MED ORDER — VITAMIN D3 50 MCG (2000 UT) PO CAPS: 2000.0000 [IU] | ORAL_CAPSULE | Freq: Every day | ORAL | Status: DC

## 2011-10-06 ENCOUNTER — Telehealth: Payer: Self-pay | Admitting: *Deleted

## 2011-10-06 ENCOUNTER — Other Ambulatory Visit (HOSPITAL_BASED_OUTPATIENT_CLINIC_OR_DEPARTMENT_OTHER): Payer: Medicare Other | Admitting: Lab

## 2011-10-06 ENCOUNTER — Ambulatory Visit (HOSPITAL_BASED_OUTPATIENT_CLINIC_OR_DEPARTMENT_OTHER): Payer: Medicare Other | Admitting: Oncology

## 2011-10-06 VITALS — BP 159/74 | HR 68 | Temp 97.5°F | Ht 62.0 in | Wt 164.0 lb

## 2011-10-06 DIAGNOSIS — E559 Vitamin D deficiency, unspecified: Secondary | ICD-10-CM

## 2011-10-06 DIAGNOSIS — C50912 Malignant neoplasm of unspecified site of left female breast: Secondary | ICD-10-CM

## 2011-10-06 DIAGNOSIS — C50919 Malignant neoplasm of unspecified site of unspecified female breast: Secondary | ICD-10-CM

## 2011-10-06 LAB — CBC WITH DIFFERENTIAL/PLATELET
BASO%: 0.4 % (ref 0.0–2.0)
Basophils Absolute: 0 10*3/uL (ref 0.0–0.1)
EOS%: 1.2 % (ref 0.0–7.0)
Eosinophils Absolute: 0.1 10*3/uL (ref 0.0–0.5)
HCT: 37.2 % (ref 34.8–46.6)
HGB: 12.6 g/dL (ref 11.6–15.9)
LYMPH%: 33.2 % (ref 14.0–49.7)
MCH: 30.4 pg (ref 25.1–34.0)
MCHC: 33.9 g/dL (ref 31.5–36.0)
MCV: 89.6 fL (ref 79.5–101.0)
MONO#: 0.4 10*3/uL (ref 0.1–0.9)
MONO%: 6.4 % (ref 0.0–14.0)
NEUT#: 3.3 10*3/uL (ref 1.5–6.5)
NEUT%: 58.8 % (ref 38.4–76.8)
Platelets: 159 10*3/uL (ref 145–400)
RBC: 4.15 10*6/uL (ref 3.70–5.45)
RDW: 13.2 % (ref 11.2–14.5)
WBC: 5.6 10*3/uL (ref 3.9–10.3)
lymph#: 1.9 10*3/uL (ref 0.9–3.3)

## 2011-10-06 NOTE — Telephone Encounter (Signed)
Made patient appointment for 03-31-2012 starting at 8:30am printed out calendar and gave to the patient

## 2011-10-06 NOTE — Progress Notes (Signed)
Hematology and Oncology Follow Up Visit  Alexandra Lamb [id number] January 03, 1946 66 y.o. 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  Interim History:  There have been no intercurrent illness, hospitalizations or medication changes. She is here today with her age. She is doing well. There have been no intercurrent illnesses or other problems. I reviewed her medications ALl  which are unchanged. She is due for followup mammogram this week.  Medications: I have reviewed the patient's current medications.  Allergies: No Known Allergies  Past Medical History, Surgical history, Social history, and Family History were reviewed and updated.  Review of Systems: Constitutional:  Negative for fever, chills, night sweats, anorexia, weight loss, pain. Cardiovascular: no chest pain or dyspnea on exertion Respiratory: no cough, shortness of breath, or wheezing Neurological: negative Dermatological: negative ENT: negative Skin Gastrointestinal: no abdominal pain, change in bowel habits, or black or bloody stools Genito-Urinary: no dysuria, trouble voiding, or hematuria Hematological and Lymphatic: negative Breast: negative for breast lumps Musculoskeletal: negative Remaining ROS negative.  Physical Exam: Blood pressure 159/74, pulse 68, temperature 97.5 F (36.4 C), height 5\' 2"  (1.575 m), weight 164 lb (74.39 kg). ECOG:  General appearance: alert, cooperative and appears stated age Head: Normocephalic, without obvious abnormality, atraumatic Neck: no adenopathy, no carotid bruit, no JVD, supple, symmetrical, trachea midline and thyroid not enlarged, symmetric, no tenderness/mass/nodules Lymph nodes: Cervical, supraclavicular, and axillary nodes normal. Cardiac : regular rate and rhythm, no murmurs or gallops Pulmonary:clear to auscultation bilaterally and normal percussion  bilaterally Breasts: inspection negative, no nipple discharge or bleeding, no masses or nodularity palpable, chest wall exam is unremarkable. Both axilla negative. Abdomen:soft, non-tender; bowel sounds normal; no masses,  no organomegaly Extremities negative Neuro: alert, oriented, normal speech, no focal findings or movement disorder noted  Lab Results: Lab Results  Component Value Date   WBC 5.6 10/06/2011   HGB 12.6 10/06/2011   HCT 37.2 10/06/2011   MCV 89.6 10/06/2011   PLT 159 10/06/2011     Chemistry      Component Value Date/Time   NA 142 04/02/2011 0920   NA 142 04/02/2011 0920   NA 142 04/02/2011 0920   K 4.4 04/02/2011 0920   K 4.4 04/02/2011 0920   K 4.4 04/02/2011 0920   CL 105 04/02/2011 0920   CL 105 04/02/2011 0920   CL 105 04/02/2011 0920   CO2 28 04/02/2011 0920   CO2 28 04/02/2011 0920   CO2 28 04/02/2011 0920   BUN 18 04/02/2011 0920   BUN 18 04/02/2011 0920   BUN 18 04/02/2011 0920   CREATININE 0.80 04/02/2011 0920   CREATININE 0.80 04/02/2011 0920   CREATININE 0.80 04/02/2011 0920      Component Value Date/Time   CALCIUM 9.8 04/02/2011 0920   CALCIUM 9.8 04/02/2011 0920   CALCIUM 9.8 04/02/2011 0920   ALKPHOS 50 04/02/2011 0920   ALKPHOS 50 04/02/2011 0920   ALKPHOS 50 04/02/2011 0920   AST 20 04/02/2011 0920   AST 20 04/02/2011 0920   AST 20 04/02/2011 0920   ALT 12 04/02/2011 0920   ALT 12 04/02/2011 0920   ALT 12 04/02/2011 0920   BILITOT 0.4 04/02/2011 0920   BILITOT 0.4 04/02/2011 0920   BILITOT 0.4 04/02/2011 0920      .pathology. Radiological Studies: chest X-ray n/a Mammogram Due this week Bone density n/a  Impression and Plan: Pt  is doing well, no clinical evidence for recurrence, f/u 6 months.  More than 50% of the visit was spent in patient-related counselling   Pierce Crane, MD 6/18/201312:25 PM

## 2011-10-07 LAB — COMPREHENSIVE METABOLIC PANEL
ALT: 13 U/L (ref 0–35)
AST: 18 U/L (ref 0–37)
Albumin: 4 g/dL (ref 3.5–5.2)
Alkaline Phosphatase: 46 U/L (ref 39–117)
BUN: 20 mg/dL (ref 6–23)
CO2: 27 mEq/L (ref 19–32)
Calcium: 9.5 mg/dL (ref 8.4–10.5)
Chloride: 104 mEq/L (ref 96–112)
Creatinine, Ser: 0.85 mg/dL (ref 0.50–1.10)
Glucose, Bld: 170 mg/dL — ABNORMAL HIGH (ref 70–99)
Potassium: 5 mEq/L (ref 3.5–5.3)
Sodium: 141 mEq/L (ref 135–145)
Total Bilirubin: 0.3 mg/dL (ref 0.3–1.2)
Total Protein: 6.2 g/dL (ref 6.0–8.3)

## 2011-10-07 LAB — VITAMIN D 25 HYDROXY (VIT D DEFICIENCY, FRACTURES): Vit D, 25-Hydroxy: 62 ng/mL (ref 30–89)

## 2011-10-08 DIAGNOSIS — F200 Paranoid schizophrenia: Secondary | ICD-10-CM | POA: Diagnosis not present

## 2011-10-13 ENCOUNTER — Other Ambulatory Visit: Payer: Self-pay | Admitting: *Deleted

## 2011-10-13 DIAGNOSIS — C50919 Malignant neoplasm of unspecified site of unspecified female breast: Secondary | ICD-10-CM

## 2011-10-13 MED ORDER — TAMOXIFEN CITRATE 20 MG PO TABS: 20.0000 mg | ORAL_TABLET | Freq: Every day | ORAL | Status: DC

## 2011-10-20 ENCOUNTER — Ambulatory Visit (INDEPENDENT_AMBULATORY_CARE_PROVIDER_SITE_OTHER): Payer: Medicare Other | Admitting: General Surgery

## 2011-10-20 ENCOUNTER — Encounter (INDEPENDENT_AMBULATORY_CARE_PROVIDER_SITE_OTHER): Payer: Self-pay | Admitting: General Surgery

## 2011-10-20 VITALS — BP 132/84 | HR 74 | Temp 97.2°F | Resp 18 | Ht 62.0 in | Wt 165.2 lb

## 2011-10-20 DIAGNOSIS — C50919 Malignant neoplasm of unspecified site of unspecified female breast: Secondary | ICD-10-CM

## 2011-10-20 DIAGNOSIS — C50912 Malignant neoplasm of unspecified site of left female breast: Secondary | ICD-10-CM

## 2011-10-20 NOTE — Progress Notes (Signed)
Chief complaint: Followup breast cancer  History: Patient returns for routine long-term followup for T2N0 cancer of the left breast status post left mastectomy with negative sentinel lymph node biopsy and subsequent tamoxifen. She is approaching 2 years following diagnosis. She reports no chest wall masses or pain or arm swelling or other concerns.  Exam: Gen.: An anxious but otherwise well-appearing female Skin: No rash or infection Lungs: Clear equal breath sounds bilaterally Lymph nodes: No cervical, supraclavicular, or axillary nodes palpable Breasts: Status post left mastectomy with chest wall negative for skin changes or palpable masses. Axilla is negative. Right breast without masses or skin changes.  Imaging: Last mammogram within one year at [facility] was negative  Assessment and plan: Doing well with no evidence of recurrent or new breast cancer. She will return in 9 months.

## 2011-10-29 DIAGNOSIS — Z1231 Encounter for screening mammogram for malignant neoplasm of breast: Secondary | ICD-10-CM | POA: Diagnosis not present

## 2011-11-03 DIAGNOSIS — F200 Paranoid schizophrenia: Secondary | ICD-10-CM | POA: Diagnosis not present

## 2011-11-24 DIAGNOSIS — E785 Hyperlipidemia, unspecified: Secondary | ICD-10-CM | POA: Diagnosis not present

## 2011-11-24 DIAGNOSIS — N182 Chronic kidney disease, stage 2 (mild): Secondary | ICD-10-CM | POA: Diagnosis not present

## 2011-11-24 DIAGNOSIS — E1129 Type 2 diabetes mellitus with other diabetic kidney complication: Secondary | ICD-10-CM | POA: Diagnosis not present

## 2011-11-24 DIAGNOSIS — I129 Hypertensive chronic kidney disease with stage 1 through stage 4 chronic kidney disease, or unspecified chronic kidney disease: Secondary | ICD-10-CM | POA: Diagnosis not present

## 2011-12-01 DIAGNOSIS — F200 Paranoid schizophrenia: Secondary | ICD-10-CM | POA: Diagnosis not present

## 2011-12-22 DIAGNOSIS — Z111 Encounter for screening for respiratory tuberculosis: Secondary | ICD-10-CM | POA: Diagnosis not present

## 2011-12-22 DIAGNOSIS — Z0000 Encounter for general adult medical examination without abnormal findings: Secondary | ICD-10-CM | POA: Diagnosis not present

## 2011-12-22 DIAGNOSIS — Z1211 Encounter for screening for malignant neoplasm of colon: Secondary | ICD-10-CM | POA: Diagnosis not present

## 2011-12-29 DIAGNOSIS — F200 Paranoid schizophrenia: Secondary | ICD-10-CM | POA: Diagnosis not present

## 2012-01-05 DIAGNOSIS — F200 Paranoid schizophrenia: Secondary | ICD-10-CM | POA: Diagnosis not present

## 2012-01-05 DIAGNOSIS — Z23 Encounter for immunization: Secondary | ICD-10-CM | POA: Diagnosis not present

## 2012-01-06 DIAGNOSIS — J209 Acute bronchitis, unspecified: Secondary | ICD-10-CM | POA: Diagnosis not present

## 2012-02-04 DIAGNOSIS — F200 Paranoid schizophrenia: Secondary | ICD-10-CM | POA: Diagnosis not present

## 2012-02-26 DIAGNOSIS — E119 Type 2 diabetes mellitus without complications: Secondary | ICD-10-CM | POA: Diagnosis not present

## 2012-02-26 DIAGNOSIS — H2510 Age-related nuclear cataract, unspecified eye: Secondary | ICD-10-CM | POA: Diagnosis not present

## 2012-02-26 DIAGNOSIS — H0230 Blepharochalasis unspecified eye, unspecified eyelid: Secondary | ICD-10-CM | POA: Diagnosis not present

## 2012-03-03 DIAGNOSIS — F200 Paranoid schizophrenia: Secondary | ICD-10-CM | POA: Diagnosis not present

## 2012-03-10 ENCOUNTER — Telehealth: Payer: Self-pay | Admitting: *Deleted

## 2012-03-10 NOTE — Telephone Encounter (Signed)
Patient confirmed over the patient the new date and time on 04-18-2012 starting at 8:00am

## 2012-03-15 ENCOUNTER — Telehealth: Payer: Self-pay | Admitting: *Deleted

## 2012-03-15 NOTE — Telephone Encounter (Signed)
Patient called in and requested to change the appointment to 05-18-2012 at 8:30am

## 2012-03-31 ENCOUNTER — Other Ambulatory Visit: Payer: Medicare Other | Admitting: Lab

## 2012-03-31 ENCOUNTER — Ambulatory Visit: Payer: Medicare Other | Admitting: Oncology

## 2012-04-12 ENCOUNTER — Other Ambulatory Visit: Payer: Self-pay | Admitting: *Deleted

## 2012-04-12 DIAGNOSIS — C50919 Malignant neoplasm of unspecified site of unspecified female breast: Secondary | ICD-10-CM

## 2012-04-12 MED ORDER — TAMOXIFEN CITRATE 20 MG PO TABS: 20.0000 mg | ORAL_TABLET | Freq: Every day | ORAL | Status: DC

## 2012-04-18 ENCOUNTER — Ambulatory Visit: Payer: Self-pay | Admitting: Oncology

## 2012-04-18 ENCOUNTER — Other Ambulatory Visit: Payer: Self-pay | Admitting: Lab

## 2012-04-19 DIAGNOSIS — F200 Paranoid schizophrenia: Secondary | ICD-10-CM | POA: Diagnosis not present

## 2012-05-13 ENCOUNTER — Encounter: Payer: Self-pay | Admitting: *Deleted

## 2012-05-13 NOTE — Progress Notes (Signed)
Called and spoke with patient's caregiver to give a follow up appt. Confirmed appt. With [name],NP at 2pm.  Patient then requests Dr. Welton Flakes.

## 2012-05-18 ENCOUNTER — Telehealth: Payer: Self-pay | Admitting: Internal Medicine

## 2012-05-18 ENCOUNTER — Ambulatory Visit: Payer: Medicare Other | Admitting: Oncology

## 2012-05-18 ENCOUNTER — Encounter: Payer: Self-pay | Admitting: Gynecologic Oncology

## 2012-05-18 ENCOUNTER — Other Ambulatory Visit: Payer: Medicare Other | Admitting: Lab

## 2012-05-18 ENCOUNTER — Ambulatory Visit (HOSPITAL_BASED_OUTPATIENT_CLINIC_OR_DEPARTMENT_OTHER): Payer: Medicare Other | Admitting: Gynecologic Oncology

## 2012-05-18 VITALS — BP 149/84 | HR 75 | Temp 97.3°F | Resp 20 | Ht 62.0 in | Wt 161.4 lb

## 2012-05-18 DIAGNOSIS — Z170 Estrogen receptor positive status [ER+]: Secondary | ICD-10-CM | POA: Diagnosis not present

## 2012-05-18 DIAGNOSIS — C50919 Malignant neoplasm of unspecified site of unspecified female breast: Secondary | ICD-10-CM

## 2012-05-18 DIAGNOSIS — C50319 Malignant neoplasm of lower-inner quadrant of unspecified female breast: Secondary | ICD-10-CM

## 2012-05-18 MED ORDER — VITAMIN D3 50 MCG (2000 UT) PO CAPS: 2000.0000 [IU] | ORAL_CAPSULE | Freq: Every day | ORAL | Status: AC

## 2012-05-18 MED ORDER — CALCIUM CARB-CHOLECALCIFEROL 600-400 MG-UNIT PO TABS: 1.0000 | ORAL_TABLET | Freq: Two times a day (BID) | ORAL | Status: DC

## 2012-05-18 MED ORDER — TAMOXIFEN CITRATE 20 MG PO TABS: 20.0000 mg | ORAL_TABLET | Freq: Every day | ORAL | Status: DC

## 2012-05-18 NOTE — Patient Instructions (Addendum)
Doing great.  Follow up in 6 months.

## 2012-05-18 NOTE — Progress Notes (Signed)
OFFICE PROGRESS NOTE  [name], MD [address] [place] [address]  DIAGNOSIS:  Alexandra Lamb is a 67 year old female who had a mammogram and a diagnostic ultrasound on October 22, 2009 at [facility] resulting two masses in the anterior aspect of the left breast, large mass medially and inferiorly, which is ill-defined and measuring 1.9 x 1.4 cm.  The smaller lesion noted was also ill defined with measurements of 1.6 x 1.2 cm.  Ultrasound evaluation revealed two hypoechoic areas within the inferior medial aspect of the breast at 7 o'clock 2 cm from the nipple, which measured 2 x 1.3 cm.  The adjacent smaller mass measured 1.4 x 1.1 cm and biopsy was recommended.  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  HER-2 was not amplified.  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  A follow up MRI scan of both breasts on November 14, 2009 was limited due to the patient being unable to cooperate with the exam.  She underwent a left total mastectomy and sentinel lymph node biopsy on December 09, 2009 by Dr. Johna Sheriff.  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  She was evaluated as not a good candidate for radiation therapy.   PRIOR THERAPY: #1 T2 N0 ER/PR + left breast cancer status post left total mastectomy and sentinel lymph node biopsy in August of 2011.    #2 She has been on Tamoxifen since 12/2009.  Tamoxifen was chosen due to findings on a baseline bone density performed at that time that revealed a T-score of -2.2 in the hip and normal bone density in the spine.  CURRENT THERAPY: Tamoxifen 20 mg daily since 12/2009.  INTERVAL HISTORY: Alexandra Lamb 67 y.o. female returns for continued follow up.  She has a history of schizophrenia and she is accompanied by a  staff member that works at the group home where she lives.  She voices no new complaints since her last visit.  She is very nervous about her appointment today because "doctors make her nervous."  She has tolerated Tamoxifen well.  Denies hot flashes or vaginal discharge.  She reported having one episode of trace vaginal bleeding five months ago that was not reported or evaluated.  She last saw Dr. Johna Sheriff on 10/20/2011.  Her last mammogram was in July of 2013 with no abnormal findings reported. She has some difficulty lying flat and also being examined.     MEDICAL HISTORY: Past Medical History  Diagnosis Date  . Hypertension   . Diabetes mellitus   . Hyperlipidemia   . Cancer     left breast  . Schizophrenia     ALLERGIES:   has no known allergies.  MEDICATIONS:  Current Outpatient Prescriptions  Medication Sig Dispense Refill  . acetaminophen (TYLENOL) 500 MG tablet Take 500 mg by mouth every 6 (six) hours as needed.        Marland Kitchen amLODipine (NORVASC) 5 MG tablet Take 5 mg by mouth daily.        Marland Kitchen atenolol (TENORMIN) 50 MG tablet Take 50 mg by mouth daily.        . Calcium Carb-Cholecalciferol (RA CALCIUM 600/VITAMIN D-3) 600-400 MG-UNIT TABS Take 1 tablet by mouth 2 (two) times daily.  90  tablet  12  . calcium carbonate (TUMS - DOSED IN MG ELEMENTAL CALCIUM) 500 MG chewable tablet Chew 1 tablet by mouth daily.        . Cholecalciferol (VITAMIN D3) 2000 UNITS capsule Take 1 capsule (2,000 Units total) by mouth daily.  90 capsule  12  . divalproex (DEPAKOTE) 500 MG DR tablet Take 1,500 mg by mouth at bedtime.       . fexofenadine (ALLEGRA) 180 MG tablet Take 180 mg by mouth as needed.       Marland Kitchen losartan (COZAAR) 100 MG tablet Take 100 mg by mouth daily.        . Multiple Vitamins-Minerals (MULTIVITAMIN WITH MINERALS) tablet Take 1 tablet by mouth daily.        Marland Kitchen OLANZapine (ZYPREXA) 5 MG tablet Take 5 mg by mouth at bedtime. One in the am and three at bedtime.      . pioglitazone (ACTOS) 30  MG tablet Take 30 mg by mouth daily.        . simvastatin (ZOCOR) 40 MG tablet Take 20 mg by mouth at bedtime.       . tamoxifen (NOLVADEX) 20 MG tablet Take 1 tablet (20 mg total) by mouth daily.  90 tablet  12    SURGICAL HISTORY:  Past Surgical History  Procedure Date  . Mastectomy     left  . Tooth extraction   . Breast surgery 2011    left breast mast    REVIEW OF SYSTEMS:  Constitutional: Feels nervous.  Cardiovascular: No chest pain, shortness of breath, or edema.  Pulmonary: No cough or wheeze.  Gastrointestinal: No nausea, vomiting, or diarrhea. No bright red blood per rectum or change in bowel movement.  Genitourinary: No frequency, urgency, or dysuria. No vaginal bleeding or discharge.  Reported one episode of trace vaginal bleeding 5 months ago but none reported since.  Musculoskeletal: No myalgia or joint pain. Neurologic: No weakness, numbness, or change in gait.  Psychology: No depression, anxiety, or insomnia.  HEALTH MAINTENANCE: Mammogram: July of 2013 Colonoscopy: Sister, who is her legal guardian, did not want the patient to have. Bone  Scan: In fall of 2011 Pap Smear: 2013 Eye Exam: October of 2013 Vitamin D: 09/2011 Lipid Panel: Manage by primary physician: Laurann Montana  PHYSICAL EXAMINATION: Blood pressure 149/84, pulse 75, temperature 97.3 F (36.3 C), temperature source Oral, resp. rate 20, height 5\' 2"  (1.575 m), weight 161 lb 6.4 oz (73.211 kg). Body mass index is 29.52 kg/(m^2). ECOG PERFORMANCE STATUS: 0 - Asymptomatic  General: Well developed, well nourished female in no acute distress. Alert and oriented x 3.  Appears anxious.  Shaking due to anxiety during examination.  Head/ Neck: Oropharynx clear without lesions.  Sclerae anicteric.  Supple without any enlargements.  Lymph node survey: No cervical or supraclavicular adenopathy  Cardiovascular: Regular rate and rhythm. S1 and S2 normal.  Lungs: Clear to auscultation bilaterally. No  wheezes/crackles/rhonchi noted.  Skin: No rashes or lesions present. Back: No CVA tenderness  Abdomen: Abdomen soft, non-tender and obese. Active bowel sounds in all quadrants. No evidence of a fluid wave or abdominal masses.  Breast:  No abnormal findings on inspection.  Left mastectomy scar noted with chest wall unremarkable.  No masses, nodularity, discharge, or erythema noted bilaterally. Extremities: No bilateral cyanosis, edema, or clubbing.    LABORATORY DATA: Lab Results  Component Value Date   WBC 5.6 10/06/2011   HGB 12.6 10/06/2011   HCT 37.2 10/06/2011   MCV  89.6 10/06/2011   PLT 159 10/06/2011      Chemistry      Component Value Date/Time   NA 141 10/06/2011 1127   K 5.0 10/06/2011 1127   CL 104 10/06/2011 1127   CO2 27 10/06/2011 1127   BUN 20 10/06/2011 1127   CREATININE 0.85 10/06/2011 1127      Component Value Date/Time   CALCIUM 9.5 10/06/2011 1127   ALKPHOS 46 10/06/2011 1127   AST 18 10/06/2011 1127   ALT 13 10/06/2011 1127   BILITOT 0.3 10/06/2011 1127       RADIOGRAPHIC STUDIES:  No results found.  ASSESSMENT:  67 year old female:   #1 with a history of T2 N0 ER/PR + left breast cancer status post left total mastectomy and sentinel lymph node biopsy in August of 2011.  She has taken Tamoxifen since 12/2009.     PLAN:  It is recommended that the patient continue taking Tamoxifen and follow up with Dr. Welton Flakes in six months.  Vitamin D3, Calcium 600 mg with Vitamin D 400 mg, and Tamoxifen refilled per caregiver request.  The patient along with her caregiver are advised to please call the office for any questions or concerns.  The importance of reporting vaginal bleeding is reinforced with the patient.  All questions were answered. The patient knows to call the clinic with any problems, questions or concerns. We can certainly see the patient much sooner if necessary.  I spent 30 minutes counseling the patient face to face with 15 minutes was spent with Dr. Welton Flakes  meeting with the patient about future recommendations. The total time spent in the appointment was one hour.   Warner Mccreedy, NP Medical/Oncology [facility] [phone] (Office)  05/18/2012, 3:02 PM

## 2012-05-18 NOTE — Telephone Encounter (Signed)
gv and printed appt schedule to pt for July

## 2012-06-07 DIAGNOSIS — F200 Paranoid schizophrenia: Secondary | ICD-10-CM | POA: Diagnosis not present

## 2012-06-14 DIAGNOSIS — J069 Acute upper respiratory infection, unspecified: Secondary | ICD-10-CM | POA: Diagnosis not present

## 2012-06-23 DIAGNOSIS — I129 Hypertensive chronic kidney disease with stage 1 through stage 4 chronic kidney disease, or unspecified chronic kidney disease: Secondary | ICD-10-CM | POA: Diagnosis not present

## 2012-06-23 DIAGNOSIS — E785 Hyperlipidemia, unspecified: Secondary | ICD-10-CM | POA: Diagnosis not present

## 2012-06-23 DIAGNOSIS — N182 Chronic kidney disease, stage 2 (mild): Secondary | ICD-10-CM | POA: Diagnosis not present

## 2012-06-23 DIAGNOSIS — E1129 Type 2 diabetes mellitus with other diabetic kidney complication: Secondary | ICD-10-CM | POA: Diagnosis not present

## 2012-06-30 DIAGNOSIS — F200 Paranoid schizophrenia: Secondary | ICD-10-CM | POA: Diagnosis not present

## 2012-07-05 DIAGNOSIS — F200 Paranoid schizophrenia: Secondary | ICD-10-CM | POA: Diagnosis not present

## 2012-07-28 DIAGNOSIS — F200 Paranoid schizophrenia: Secondary | ICD-10-CM | POA: Diagnosis not present

## 2012-08-10 ENCOUNTER — Telehealth (INDEPENDENT_AMBULATORY_CARE_PROVIDER_SITE_OTHER): Payer: Self-pay

## 2012-08-10 NOTE — Telephone Encounter (Signed)
Called [facility] to request recent MGM that was completed on July 2013.  Report to be faxed to our office for patients appointment on 08/11/12 @ 9:45 am w/Dr. Johna Sheriff.

## 2012-08-11 ENCOUNTER — Encounter (INDEPENDENT_AMBULATORY_CARE_PROVIDER_SITE_OTHER): Payer: Self-pay | Admitting: General Surgery

## 2012-08-11 ENCOUNTER — Ambulatory Visit (INDEPENDENT_AMBULATORY_CARE_PROVIDER_SITE_OTHER): Payer: Medicare Other | Admitting: General Surgery

## 2012-08-11 VITALS — BP 132/86 | HR 76 | Temp 97.7°F | Resp 16 | Ht 62.0 in | Wt 161.4 lb

## 2012-08-11 DIAGNOSIS — C50919 Malignant neoplasm of unspecified site of unspecified female breast: Secondary | ICD-10-CM | POA: Diagnosis not present

## 2012-08-11 DIAGNOSIS — C50912 Malignant neoplasm of unspecified site of left female breast: Secondary | ICD-10-CM

## 2012-08-11 NOTE — Progress Notes (Signed)
Chief complaint: Followup breast cancer   History: Patient returns for routine long-term followup for T2N0 cancer of the left breast status post left mastectomy with negative sentinel lymph node biopsy and subsequent tamoxifen. She is approaching 3 years following diagnosis. She reports no chest wall masses or pain or arm swelling or other concerns.   Exam:  Gen.: An anxious but otherwise well-appearing female  Skin: No rash or infection  Lungs: Clear equal breath sounds bilaterally  Lymph nodes: No cervical, supraclavicular, or axillary nodes palpable  Breasts: Status post left mastectomy with chest wall negative for skin changes or palpable masses. Axilla is negative. Right breast without masses or skin changes.   Imaging: Mammogram 10/29/2011 at [facility] was negative   Assessment and plan: Doing well with no evidence of recurrent or new breast cancer. She will return in 9 months.

## 2012-08-30 DIAGNOSIS — F200 Paranoid schizophrenia: Secondary | ICD-10-CM | POA: Diagnosis not present

## 2012-09-05 ENCOUNTER — Encounter (INDEPENDENT_AMBULATORY_CARE_PROVIDER_SITE_OTHER): Payer: Self-pay

## 2012-09-22 ENCOUNTER — Telehealth: Payer: Self-pay | Admitting: Medical Oncology

## 2012-09-22 NOTE — Telephone Encounter (Signed)
Lequita Halt, representative at [facility], called requesting dispensing orders for mastectomy supplies for patient to be faxed to [phone].  Per MD, order faxed to number given.  sched appt 07/01 lab, 07/31 lab/MD

## 2012-09-27 DIAGNOSIS — F200 Paranoid schizophrenia: Secondary | ICD-10-CM | POA: Diagnosis not present

## 2012-10-04 ENCOUNTER — Telehealth: Payer: Self-pay | Admitting: Oncology

## 2012-10-04 NOTE — Telephone Encounter (Signed)
Faxed pt medical records to [employer]

## 2012-10-18 ENCOUNTER — Other Ambulatory Visit (HOSPITAL_BASED_OUTPATIENT_CLINIC_OR_DEPARTMENT_OTHER): Payer: Medicare Other

## 2012-10-18 DIAGNOSIS — C50919 Malignant neoplasm of unspecified site of unspecified female breast: Secondary | ICD-10-CM | POA: Diagnosis not present

## 2012-10-18 LAB — CBC WITH DIFFERENTIAL/PLATELET
BASO%: 0.4 % (ref 0.0–2.0)
Basophils Absolute: 0 10*3/uL (ref 0.0–0.1)
EOS%: 1.4 % (ref 0.0–7.0)
Eosinophils Absolute: 0.1 10*3/uL (ref 0.0–0.5)
HCT: 37.9 % (ref 34.8–46.6)
HGB: 13.1 g/dL (ref 11.6–15.9)
LYMPH%: 34.2 % (ref 14.0–49.7)
MCH: 30.2 pg (ref 25.1–34.0)
MCHC: 34.5 g/dL (ref 31.5–36.0)
MCV: 87.4 fL (ref 79.5–101.0)
MONO#: 0.5 10*3/uL (ref 0.1–0.9)
MONO%: 9.1 % (ref 0.0–14.0)
NEUT#: 3 10*3/uL (ref 1.5–6.5)
NEUT%: 54.9 % (ref 38.4–76.8)
Platelets: 155 10*3/uL (ref 145–400)
RBC: 4.33 10*6/uL (ref 3.70–5.45)
RDW: 13.6 % (ref 11.2–14.5)
WBC: 5.4 10*3/uL (ref 3.9–10.3)
lymph#: 1.9 10*3/uL (ref 0.9–3.3)

## 2012-11-01 DIAGNOSIS — Z1231 Encounter for screening mammogram for malignant neoplasm of breast: Secondary | ICD-10-CM | POA: Diagnosis not present

## 2012-11-17 ENCOUNTER — Encounter: Payer: Self-pay | Admitting: Oncology

## 2012-11-17 ENCOUNTER — Ambulatory Visit (HOSPITAL_BASED_OUTPATIENT_CLINIC_OR_DEPARTMENT_OTHER): Payer: Medicare Other | Admitting: Oncology

## 2012-11-17 ENCOUNTER — Telehealth: Payer: Self-pay | Admitting: Oncology

## 2012-11-17 ENCOUNTER — Other Ambulatory Visit (HOSPITAL_BASED_OUTPATIENT_CLINIC_OR_DEPARTMENT_OTHER): Payer: Medicare Other | Admitting: Lab

## 2012-11-17 VITALS — BP 157/67 | HR 66 | Temp 97.4°F | Resp 20 | Ht 62.0 in | Wt 160.2 lb

## 2012-11-17 DIAGNOSIS — C50912 Malignant neoplasm of unspecified site of left female breast: Secondary | ICD-10-CM

## 2012-11-17 DIAGNOSIS — E559 Vitamin D deficiency, unspecified: Secondary | ICD-10-CM | POA: Diagnosis not present

## 2012-11-17 DIAGNOSIS — C50919 Malignant neoplasm of unspecified site of unspecified female breast: Secondary | ICD-10-CM

## 2012-11-17 LAB — COMPREHENSIVE METABOLIC PANEL (CC13)
ALT: 17 U/L (ref 0–55)
AST: 20 U/L (ref 5–34)
Albumin: 3.4 g/dL — ABNORMAL LOW (ref 3.5–5.0)
Alkaline Phosphatase: 55 U/L (ref 40–150)
BUN: 21.3 mg/dL (ref 7.0–26.0)
CO2: 28 mEq/L (ref 22–29)
Calcium: 9.9 mg/dL (ref 8.4–10.4)
Chloride: 106 mEq/L (ref 98–109)
Creatinine: 0.9 mg/dL (ref 0.6–1.1)
Glucose: 122 mg/dl (ref 70–140)
Potassium: 4.6 mEq/L (ref 3.5–5.1)
Sodium: 142 mEq/L (ref 136–145)
Total Bilirubin: 0.31 mg/dL (ref 0.20–1.20)
Total Protein: 6.6 g/dL (ref 6.4–8.3)

## 2012-11-17 NOTE — Progress Notes (Signed)
OFFICE PROGRESS NOTE  CC  Alexandra Bradford, MD [address] [place] [address] Dr. 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  PRIOR THERAPY:  #1 patient had a mammogram performed that showed abnormality she went on to have a diagnostic ultrasound on 10/22/2009 that showed 2 masses in the anterior aspect of the left breast. Large mass medially and inferiorly measuring 1.9 x 1.4 cm. The smaller lesion measured 1.6 x 1.2 cm. Ultrasound showed 2 hypoechoic areas within the inferior medial aspect of the breast at 7:00 62 cm from the nipple measuring 2 x 1.3 cm. The adjacent smaller mass measured 1.4 x 1.1 cm.  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 She had MRI of the breasts performed on 11/14/2009 it was a limited study due to patient now being able to cooperate with the examination.  #3 on 12/09/2009 patient underwent a left total mastectomy with sentinel lymph node biopsy. 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  #4 postoperatively patient was felt not to be a candidate for chemotherapy or radiation and therefore she was begun on tamoxifen beginning September 2011. She has remained on this since overall tolerating it well.  CURRENT THERAPY: Tamoxifen 20 mg daily  INTERVAL HISTORY: Alexandra Lamb 67 y.o. female returns for followup visit today. Overall she's doing well without any significant problems. She denies any fevers chills night sweats headaches shortness of breath chest pains palpitations she does have some hot flashes off-and-on. She has no myalgias and arthralgias no vaginal discharge. Remainder of the 10 point review of  systems is negative.  MEDICAL HISTORY: Past Medical History  Diagnosis Date  . Hypertension   . Diabetes mellitus   . Hyperlipidemia   . Cancer     left breast  . Schizophrenia     ALLERGIES:  has No Known Allergies.  MEDICATIONS:  Current Outpatient Prescriptions  Medication Sig Dispense Refill  . acetaminophen (TYLENOL) 500 MG tablet Take 500 mg by mouth every 6 (six) hours as needed.        Marland Kitchen amLODipine (NORVASC) 5 MG tablet Take 5 mg by mouth daily.        Marland Kitchen atenolol (TENORMIN) 50 MG tablet Take 50 mg by mouth daily.        . Calcium Carb-Cholecalciferol (RA CALCIUM 600/VITAMIN D-3) 600-400 MG-UNIT TABS Take 1 tablet by mouth 2 (two) times daily.  90 tablet  12  . calcium carbonate (TUMS - DOSED IN MG ELEMENTAL CALCIUM) 500 MG chewable tablet Chew 1 tablet by mouth daily.        . Cholecalciferol (VITAMIN D3) 2000 UNITS capsule Take 1 capsule (2,000 Units total) by mouth daily.  90 capsule  12  . divalproex (DEPAKOTE) 500 MG DR tablet Take 1,500 mg by mouth at bedtime.       . fexofenadine (ALLEGRA) 180 MG tablet Take 180 mg by mouth as needed.       Marland Kitchen losartan (COZAAR) 100 MG tablet Take 100 mg by mouth daily.        . Multiple Vitamins-Minerals (MULTIVITAMIN WITH MINERALS) tablet Take 1 tablet by mouth daily.        Marland Kitchen  OLANZapine (ZYPREXA) 5 MG tablet Take 5 mg by mouth at bedtime. One in the am and three at bedtime.      . pioglitazone (ACTOS) 30 MG tablet Take 30 mg by mouth daily.        . simvastatin (ZOCOR) 40 MG tablet Take 10 mg by mouth at bedtime.       . tamoxifen (NOLVADEX) 20 MG tablet Take 1 tablet (20 mg total) by mouth daily.  90 tablet  12   No current facility-administered medications for this visit.    SURGICAL HISTORY:  Past Surgical History  Procedure Laterality Date  . Mastectomy      left  . Tooth extraction    . Breast surgery  2011    left breast mast    REVIEW OF SYSTEMS:  Pertinent items are noted in HPI.   HEALTH  MAINTENANCE:    PHYSICAL EXAMINATION: Blood pressure 157/67, pulse 66, temperature 97.4 F (36.3 C), temperature source Oral, resp. rate 20, height 5\' 2"  (1.575 m), weight 160 lb 3.2 oz (72.666 kg). Body mass index is 29.29 kg/(m^2). ECOG PERFORMANCE STATUS: 0 - Asymptomatic   General appearance: alert, cooperative and appears stated age Resp: clear to auscultation bilaterally Cardio: regular rate and rhythm GI: soft, non-tender; bowel sounds normal; no masses,  no organomegaly Extremities: extremities normal, atraumatic, no cyanosis or edema Neurologic: Grossly normal Right breast no masses nipple discharge left mastectomy scar is well healed no evidence of local recurrence  LABORATORY DATA: Lab Results  Component Value Date   WBC 5.4 10/18/2012   HGB 13.1 10/18/2012   HCT 37.9 10/18/2012   MCV 87.4 10/18/2012   PLT 155 10/18/2012      Chemistry      Component Value Date/Time   NA 142 11/17/2012 1014   NA 141 10/06/2011 1127   K 4.6 11/17/2012 1014   K 5.0 10/06/2011 1127   CL 104 10/06/2011 1127   CO2 28 11/17/2012 1014   CO2 27 10/06/2011 1127   BUN 21.3 11/17/2012 1014   BUN 20 10/06/2011 1127   CREATININE 0.9 11/17/2012 1014   CREATININE 0.85 10/06/2011 1127      Component Value Date/Time   CALCIUM 9.9 11/17/2012 1014   CALCIUM 9.5 10/06/2011 1127   ALKPHOS 55 11/17/2012 1014   ALKPHOS 46 10/06/2011 1127   AST 20 11/17/2012 1014   AST 18 10/06/2011 1127   ALT 17 11/17/2012 1014   ALT 13 10/06/2011 1127   BILITOT 0.31 11/17/2012 1014   BILITOT 0.3 10/06/2011 1127       RADIOGRAPHIC STUDIES:  No results found.  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 Patient is without any evidence of local recurrence.  #2 patient has been on adjuvant antiestrogen therapy with tamoxifen 20 mg daily starting September 2011. Overall she is tolerating it well without  any significant problems. She will continue this for 10 years.   PLAN:   #1 continue tamoxifen daily as prescribed.  #2 continue to get yearly gynecologic examinations ophthalmologic examinations and mammograms.  #3 I will see the patient back in 6 months time for followup.   All questions were answered. The patient knows to call the clinic with any problems, questions or concerns. We can certainly see the patient much sooner if necessary.  I spent 25 minutes counseling the patient face to face. The total time  spent in the appointment was 30 minutes.    Drue Second, MD Medical/Oncology [facility] [phone] (beeper) [phone] (Office)  11/17/2012, 11:18 AM

## 2012-11-17 NOTE — Patient Instructions (Addendum)
Doing well  Continue tamoxifen 20 mg daily  I will see you back in Feb 2015

## 2012-11-22 DIAGNOSIS — F200 Paranoid schizophrenia: Secondary | ICD-10-CM | POA: Diagnosis not present

## 2012-12-19 DIAGNOSIS — Z23 Encounter for immunization: Secondary | ICD-10-CM | POA: Diagnosis not present

## 2012-12-27 DIAGNOSIS — Z111 Encounter for screening for respiratory tuberculosis: Secondary | ICD-10-CM | POA: Diagnosis not present

## 2012-12-27 DIAGNOSIS — Z1331 Encounter for screening for depression: Secondary | ICD-10-CM | POA: Diagnosis not present

## 2012-12-27 DIAGNOSIS — N182 Chronic kidney disease, stage 2 (mild): Secondary | ICD-10-CM | POA: Diagnosis not present

## 2012-12-27 DIAGNOSIS — I129 Hypertensive chronic kidney disease with stage 1 through stage 4 chronic kidney disease, or unspecified chronic kidney disease: Secondary | ICD-10-CM | POA: Diagnosis not present

## 2012-12-27 DIAGNOSIS — E1129 Type 2 diabetes mellitus with other diabetic kidney complication: Secondary | ICD-10-CM | POA: Diagnosis not present

## 2012-12-27 DIAGNOSIS — Z0000 Encounter for general adult medical examination without abnormal findings: Secondary | ICD-10-CM | POA: Diagnosis not present

## 2012-12-27 DIAGNOSIS — Z1211 Encounter for screening for malignant neoplasm of colon: Secondary | ICD-10-CM | POA: Diagnosis not present

## 2012-12-27 DIAGNOSIS — E785 Hyperlipidemia, unspecified: Secondary | ICD-10-CM | POA: Diagnosis not present

## 2013-01-03 DIAGNOSIS — F200 Paranoid schizophrenia: Secondary | ICD-10-CM | POA: Diagnosis not present

## 2013-01-10 DIAGNOSIS — E785 Hyperlipidemia, unspecified: Secondary | ICD-10-CM | POA: Diagnosis not present

## 2013-01-10 DIAGNOSIS — R946 Abnormal results of thyroid function studies: Secondary | ICD-10-CM | POA: Diagnosis not present

## 2013-01-10 DIAGNOSIS — N182 Chronic kidney disease, stage 2 (mild): Secondary | ICD-10-CM | POA: Diagnosis not present

## 2013-01-17 ENCOUNTER — Encounter (INDEPENDENT_AMBULATORY_CARE_PROVIDER_SITE_OTHER): Payer: Self-pay

## 2013-01-17 DIAGNOSIS — Z79899 Other long term (current) drug therapy: Secondary | ICD-10-CM | POA: Diagnosis not present

## 2013-01-17 DIAGNOSIS — N949 Unspecified condition associated with female genital organs and menstrual cycle: Secondary | ICD-10-CM | POA: Diagnosis not present

## 2013-01-31 DIAGNOSIS — F200 Paranoid schizophrenia: Secondary | ICD-10-CM | POA: Diagnosis not present

## 2013-02-07 DIAGNOSIS — Z780 Asymptomatic menopausal state: Secondary | ICD-10-CM | POA: Diagnosis not present

## 2013-02-14 DIAGNOSIS — F200 Paranoid schizophrenia: Secondary | ICD-10-CM | POA: Diagnosis not present

## 2013-02-28 DIAGNOSIS — H0230 Blepharochalasis unspecified eye, unspecified eyelid: Secondary | ICD-10-CM | POA: Diagnosis not present

## 2013-02-28 DIAGNOSIS — H2510 Age-related nuclear cataract, unspecified eye: Secondary | ICD-10-CM | POA: Diagnosis not present

## 2013-02-28 DIAGNOSIS — E119 Type 2 diabetes mellitus without complications: Secondary | ICD-10-CM | POA: Diagnosis not present

## 2013-03-02 DIAGNOSIS — J209 Acute bronchitis, unspecified: Secondary | ICD-10-CM | POA: Diagnosis not present

## 2013-03-14 DIAGNOSIS — F200 Paranoid schizophrenia: Secondary | ICD-10-CM | POA: Diagnosis not present

## 2013-03-23 ENCOUNTER — Encounter (INDEPENDENT_AMBULATORY_CARE_PROVIDER_SITE_OTHER): Payer: Self-pay

## 2013-03-23 ENCOUNTER — Ambulatory Visit (INDEPENDENT_AMBULATORY_CARE_PROVIDER_SITE_OTHER): Payer: Medicare Other | Admitting: General Surgery

## 2013-03-23 ENCOUNTER — Encounter (INDEPENDENT_AMBULATORY_CARE_PROVIDER_SITE_OTHER): Payer: Self-pay | Admitting: General Surgery

## 2013-03-23 VITALS — BP 124/64 | HR 60 | Temp 98.0°F | Resp 18 | Ht 64.0 in | Wt 157.0 lb

## 2013-03-23 DIAGNOSIS — C50919 Malignant neoplasm of unspecified site of unspecified female breast: Secondary | ICD-10-CM

## 2013-03-23 DIAGNOSIS — C50912 Malignant neoplasm of unspecified site of left female breast: Secondary | ICD-10-CM

## 2013-03-23 NOTE — Progress Notes (Signed)
Chief complaint: Followup breast cancer   History: Patient returns for routine long-term followup for T2N0 cancer of the left breast status post left mastectomy with negative sentinel lymph node biopsy and subsequent tamoxifen. She is approaching 3 years following diagnosis. She reports no chest wall masses or pain or arm swelling or other concerns.   Exam:  Gen.: An anxious but otherwise well-appearing female  Skin: No rash or infection  Lungs: Clear equal breath sounds bilaterally  Lymph nodes: No cervical, supraclavicular, or axillary nodes palpable  Breasts: Status post left mastectomy with chest wall negative for skin changes or palpable masses. Axilla is negative. Right breast without masses or skin changes.   Imaging: Mammogram 10/2012 at [facility] was negative   Assessment and plan: Doing well with no evidence of recurrent or new breast cancer. She will return in 6 months.

## 2013-04-04 DIAGNOSIS — R946 Abnormal results of thyroid function studies: Secondary | ICD-10-CM | POA: Diagnosis not present

## 2013-04-25 DIAGNOSIS — F200 Paranoid schizophrenia: Secondary | ICD-10-CM | POA: Diagnosis not present

## 2013-06-06 ENCOUNTER — Other Ambulatory Visit: Payer: Self-pay | Admitting: *Deleted

## 2013-06-06 DIAGNOSIS — C50919 Malignant neoplasm of unspecified site of unspecified female breast: Secondary | ICD-10-CM

## 2013-06-06 MED ORDER — CALCIUM CARB-CHOLECALCIFEROL 600-400 MG-UNIT PO TABS: 1.0000 | ORAL_TABLET | Freq: Two times a day (BID) | ORAL | Status: DC

## 2013-06-13 ENCOUNTER — Encounter (INDEPENDENT_AMBULATORY_CARE_PROVIDER_SITE_OTHER): Payer: Self-pay

## 2013-06-13 ENCOUNTER — Other Ambulatory Visit: Payer: Self-pay | Admitting: Emergency Medicine

## 2013-06-13 ENCOUNTER — Other Ambulatory Visit (HOSPITAL_BASED_OUTPATIENT_CLINIC_OR_DEPARTMENT_OTHER): Payer: Medicare Other

## 2013-06-13 ENCOUNTER — Encounter: Payer: Self-pay | Admitting: Oncology

## 2013-06-13 ENCOUNTER — Telehealth: Payer: Self-pay | Admitting: Oncology

## 2013-06-13 ENCOUNTER — Ambulatory Visit (HOSPITAL_BASED_OUTPATIENT_CLINIC_OR_DEPARTMENT_OTHER): Payer: Medicare Other | Admitting: Oncology

## 2013-06-13 VITALS — BP 164/70 | HR 60 | Temp 97.5°F | Resp 18 | Ht 64.0 in | Wt 161.0 lb

## 2013-06-13 DIAGNOSIS — C50912 Malignant neoplasm of unspecified site of left female breast: Secondary | ICD-10-CM

## 2013-06-13 DIAGNOSIS — Z170 Estrogen receptor positive status [ER+]: Secondary | ICD-10-CM | POA: Diagnosis not present

## 2013-06-13 DIAGNOSIS — C50319 Malignant neoplasm of lower-inner quadrant of unspecified female breast: Secondary | ICD-10-CM

## 2013-06-13 DIAGNOSIS — C50919 Malignant neoplasm of unspecified site of unspecified female breast: Secondary | ICD-10-CM

## 2013-06-13 LAB — CBC WITH DIFFERENTIAL/PLATELET
BASO%: 0.8 % (ref 0.0–2.0)
Basophils Absolute: 0 10*3/uL (ref 0.0–0.1)
EOS%: 2.2 % (ref 0.0–7.0)
Eosinophils Absolute: 0.1 10*3/uL (ref 0.0–0.5)
HCT: 38 % (ref 34.8–46.6)
HGB: 12.6 g/dL (ref 11.6–15.9)
LYMPH%: 29.8 % (ref 14.0–49.7)
MCH: 29.2 pg (ref 25.1–34.0)
MCHC: 33.3 g/dL (ref 31.5–36.0)
MCV: 87.7 fL (ref 79.5–101.0)
MONO#: 0.5 10*3/uL (ref 0.1–0.9)
MONO%: 10 % (ref 0.0–14.0)
NEUT#: 2.6 10*3/uL (ref 1.5–6.5)
NEUT%: 57.2 % (ref 38.4–76.8)
Platelets: 190 10*3/uL (ref 145–400)
RBC: 4.33 10*6/uL (ref 3.70–5.45)
RDW: 13.3 % (ref 11.2–14.5)
WBC: 4.6 10*3/uL (ref 3.9–10.3)
lymph#: 1.4 10*3/uL (ref 0.9–3.3)

## 2013-06-13 LAB — COMPREHENSIVE METABOLIC PANEL (CC13)
ALT: 14 U/L (ref 0–55)
AST: 18 U/L (ref 5–34)
Albumin: 3.3 g/dL — ABNORMAL LOW (ref 3.5–5.0)
Alkaline Phosphatase: 45 U/L (ref 40–150)
Anion Gap: 9 mEq/L (ref 3–11)
BUN: 21.7 mg/dL (ref 7.0–26.0)
CO2: 25 mEq/L (ref 22–29)
Calcium: 9.6 mg/dL (ref 8.4–10.4)
Chloride: 105 mEq/L (ref 98–109)
Creatinine: 0.8 mg/dL (ref 0.6–1.1)
Glucose: 188 mg/dl — ABNORMAL HIGH (ref 70–140)
Potassium: 4.8 mEq/L (ref 3.5–5.1)
Sodium: 139 mEq/L (ref 136–145)
Total Bilirubin: 0.24 mg/dL (ref 0.20–1.20)
Total Protein: 6.1 g/dL — ABNORMAL LOW (ref 6.4–8.3)

## 2013-06-13 MED ORDER — CALCIUM CARBONATE-VITAMIN D 600-400 MG-UNIT PO TABS: 1.0000 | ORAL_TABLET | Freq: Every day | ORAL | Status: DC

## 2013-06-13 MED ORDER — TAMOXIFEN CITRATE 20 MG PO TABS: 20.0000 mg | ORAL_TABLET | Freq: Every day | ORAL | Status: DC

## 2013-06-13 MED ORDER — VITAMIN D3 50 MCG (2000 UT) PO CAPS: 2000.0000 [IU] | ORAL_CAPSULE | Freq: Every day | ORAL | Status: DC

## 2013-06-13 NOTE — Progress Notes (Signed)
OFFICE PROGRESS NOTE  CC  Vidal Schwalbe, MD [address], [address] [place] [address] Dr. Excell Seltzer  DIAGNOSIS: 68 year old female with history of stage II left breast cancer diagnosed in August 2011.  Cancer of left breast T2N0 S/P total mastectomy, SLN Bx 11/2009, tmoxifen   Primary site: Breast (Left)   Staging method: AJCC 7th Edition   Clinical: (T2, N0, cM0)   Pathologic: Stage IIA (T2, N0, cM0) signed by Deatra Robinson, MD on 06/13/2013 10:27 AM   Summary: Stage IIA (T2, N0, cM0)  PRIOR THERAPY:  #1 patient had a mammogram performed that showed abnormality she went on to have a diagnostic ultrasound on 10/22/2009 that showed 2 masses in the anterior aspect of the left breast. Large mass medially and inferiorly measuring 1.9 x 1.4 cm. The smaller lesion measured 1.6 x 1.2 cm. Ultrasound showed 2 hypoechoic areas within the inferior medial aspect of the breast at 7:00 62 cm from the nipple measuring 2 x 1.3 cm. The adjacent smaller mass measured 1.4 x 1.1 cm.  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 She had MRI of the breasts performed on 11/14/2009 it was a limited study due to patient now being able to cooperate with the examination.  #3 On 12/09/2009 patient underwent a left total mastectomy with sentinel lymph node biopsy. 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  #4 postoperatively patient was felt not to be a candidate for chemotherapy or radiation and therefore she was begun on tamoxifen beginning September 2011. She has remained on this since overall tolerating it well.  CURRENT THERAPY: Tamoxifen 20 mg daily  INTERVAL HISTORY: Alexandra Lamb 68 y.o. female returns for followup  visit today. She is accompanied by her caregiver. Overall she's doing well without any significant problems. She denies any fevers chills night sweats headaches shortness of breath chest pains palpitations she does have some hot flashes off-and-on. She has no myalgias and arthralgias no vaginal discharge. Remainder of the 10 point review of systems is negative.  MEDICAL HISTORY: Past Medical History  Diagnosis Date  . Hypertension   . Diabetes mellitus   . Hyperlipidemia   . Cancer     left breast  . Schizophrenia     ALLERGIES:  has No Known Allergies.  MEDICATIONS:  Current Outpatient Prescriptions  Medication Sig Dispense Refill  . acetaminophen (TYLENOL) 500 MG tablet Take 500 mg by mouth every 6 (six) hours as needed.        Marland Kitchen amLODipine (NORVASC) 5 MG tablet Take 5 mg by mouth daily.        Marland Kitchen atenolol (TENORMIN) 50 MG tablet Take 50 mg by mouth daily.        . Calcium Carb-Cholecalciferol (RA CALCIUM 600/VITAMIN D-3) 600-400 MG-UNIT TABS Take 1 tablet by mouth 2 (two) times daily.  90 tablet  0  . calcium carbonate (TUMS - DOSED IN MG ELEMENTAL CALCIUM) 500 MG chewable tablet Chew 1 tablet by mouth daily.        . divalproex (DEPAKOTE) 500 MG DR tablet Take 1,500 mg by mouth at bedtime.       . fexofenadine (ALLEGRA) 180 MG tablet Take 180 mg by mouth  as needed.       Marland Kitchen losartan (COZAAR) 100 MG tablet Take 100 mg by mouth daily.        . Multiple Vitamins-Minerals (MULTIVITAMIN WITH MINERALS) tablet Take 1 tablet by mouth daily.        Marland Kitchen OLANZapine (ZYPREXA) 5 MG tablet Take 5 mg by mouth at bedtime. One in the am and three at bedtime.      . pioglitazone (ACTOS) 30 MG tablet Take 30 mg by mouth daily.        . simvastatin (ZOCOR) 10 MG tablet Take 10 mg by mouth daily.      . Calcium Carbonate-Vitamin D (CALCIUM 600/VITAMIN D) 600-400 MG-UNIT per tablet Take 1 tablet by mouth daily.  30 tablet  12  . Cholecalciferol (VITAMIN D3) 2000 UNITS capsule Take 1 capsule (2,000 Units  total) by mouth daily.  30 capsule  12  . tamoxifen (NOLVADEX) 20 MG tablet Take 1 tablet (20 mg total) by mouth daily.  90 tablet  4   No current facility-administered medications for this visit.    SURGICAL HISTORY:  Past Surgical History  Procedure Laterality Date  . Mastectomy      left  . Tooth extraction    . Breast surgery  2011    left breast mast    REVIEW OF SYSTEMS:  Pertinent items are noted in HPI.   HEALTH MAINTENANCE:    PHYSICAL EXAMINATION: Blood pressure 164/70, pulse 60, temperature 97.5 F (36.4 C), temperature source Oral, resp. rate 18, height 5' 4"  (1.626 m), weight 161 lb (73.029 kg). Body mass index is 27.62 kg/(m^2). ECOG PERFORMANCE STATUS: 0 - Asymptomatic   General appearance: alert, cooperative and appears stated age Resp: clear to auscultation bilaterally Cardio: regular rate and rhythm GI: soft, non-tender; bowel sounds normal; no masses,  no organomegaly Extremities: extremities normal, atraumatic, no cyanosis or edema Neurologic: Grossly normal Right breast no masses nipple discharge left mastectomy scar is well healed no evidence of local recurrence  LABORATORY DATA: Lab Results  Component Value Date   WBC 4.6 06/13/2013   HGB 12.6 06/13/2013   HCT 38.0 06/13/2013   MCV 87.7 06/13/2013   PLT 190 06/13/2013      Chemistry      Component Value Date/Time   NA 139 06/13/2013 0908   NA 141 10/06/2011 1127   K 4.8 06/13/2013 0908   K 5.0 10/06/2011 1127   CL 104 10/06/2011 1127   CO2 25 06/13/2013 0908   CO2 27 10/06/2011 1127   BUN 21.7 06/13/2013 0908   BUN 20 10/06/2011 1127   CREATININE 0.8 06/13/2013 0908   CREATININE 0.85 10/06/2011 1127      Component Value Date/Time   CALCIUM 9.6 06/13/2013 0908   CALCIUM 9.5 10/06/2011 1127   ALKPHOS 45 06/13/2013 0908   ALKPHOS 46 10/06/2011 1127   AST 18 06/13/2013 0908   AST 18 10/06/2011 1127   ALT 14 06/13/2013 0908   ALT 13 10/06/2011 1127   BILITOT 0.24 06/13/2013 0908   BILITOT 0.3 10/06/2011  1127       RADIOGRAPHIC STUDIES:  No results found.  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 Patient is without any evidence of local recurrence.  #2 patient has been on adjuvant antiestrogen therapy with tamoxifen 20 mg daily starting September 2011. Overall she is tolerating it well without any  significant problems. She will continue this for 10 years.   PLAN:   #1 continue tamoxifen daily as prescribed. New prescription for tamoxifen, calcium, and vitamin D were given to her.  #2 continue to get yearly gynecologic examinations ophthalmologic examinations and mammograms.  #3 I will see the patient back in 6 months time for followup.   All questions were answered. The patient knows to call the clinic with any problems, questions or concerns. We can certainly see the patient much sooner if necessary.  I spent 10 minutes counseling the patient face to face. The total time spent in the appointment was 20 minutes.    Marcy Panning, MD Medical/Oncology [facility] [phone] (beeper) [phone] (Office)  06/13/2013, 10:25 AM

## 2013-06-13 NOTE — Telephone Encounter (Signed)
, °

## 2013-06-13 NOTE — Patient Instructions (Signed)
Doing well, no evidence of recurrent cancer  Continue taking tamoxifen 20 mg daily  We will see you back in 6 months  Please call with any problems, questions or concerns.

## 2013-06-27 DIAGNOSIS — E785 Hyperlipidemia, unspecified: Secondary | ICD-10-CM | POA: Diagnosis not present

## 2013-06-27 DIAGNOSIS — Z23 Encounter for immunization: Secondary | ICD-10-CM | POA: Diagnosis not present

## 2013-06-27 DIAGNOSIS — N182 Chronic kidney disease, stage 2 (mild): Secondary | ICD-10-CM | POA: Diagnosis not present

## 2013-06-27 DIAGNOSIS — E119 Type 2 diabetes mellitus without complications: Secondary | ICD-10-CM | POA: Diagnosis not present

## 2013-06-27 DIAGNOSIS — I10 Essential (primary) hypertension: Secondary | ICD-10-CM | POA: Diagnosis not present

## 2013-06-27 DIAGNOSIS — R946 Abnormal results of thyroid function studies: Secondary | ICD-10-CM | POA: Diagnosis not present

## 2013-07-04 ENCOUNTER — Telehealth: Payer: Self-pay | Admitting: *Deleted

## 2013-07-04 DIAGNOSIS — F200 Paranoid schizophrenia: Secondary | ICD-10-CM | POA: Diagnosis not present

## 2013-07-04 NOTE — Telephone Encounter (Signed)
Received refill request from [facility] for Tamoxifen 20mg  tablet.  Per Dr. Laurelyn Sickle notes from 06/13/13 - pt to continue Tamoxifen for total 30yrs ( started  September 2011 ).  Prescription was given to pt and caregiver at office visit.  Spoke with [facility] and informed her of above info.  Benjamine Mola stated she would follow up with the rehab center to obtain original copy of prescription. [place]  Phone    [phone].

## 2013-07-25 ENCOUNTER — Telehealth: Payer: Self-pay

## 2013-07-25 NOTE — Telephone Encounter (Signed)
Returned call to [employer].  Calcium prescription on 2/24 changed Calcium/Vit D to daily.  Was formerly BID.  She just wants to confirm the change.  Let her know KK out of ofc until next week and I would get back to her.  She voiced understanding.  Routed to [place].

## 2013-07-27 ENCOUNTER — Other Ambulatory Visit: Payer: Self-pay

## 2013-07-27 NOTE — Telephone Encounter (Signed)
ENCOUNTER OPENED IN ERROR

## 2013-08-01 DIAGNOSIS — F200 Paranoid schizophrenia: Secondary | ICD-10-CM | POA: Diagnosis not present

## 2013-08-01 NOTE — Telephone Encounter (Signed)
The calcium/vitamin D should be daily

## 2013-08-02 ENCOUNTER — Telehealth: Payer: Self-pay

## 2013-08-02 NOTE — Telephone Encounter (Signed)
LMOVM for Alexandra Lamb did intend for pt to reduce calcium from BID to daily.  Also advised that refill request from [place] would not be approved and to call clinic if she has any questions.

## 2013-09-05 DIAGNOSIS — F200 Paranoid schizophrenia: Secondary | ICD-10-CM | POA: Diagnosis not present

## 2013-10-03 DIAGNOSIS — R946 Abnormal results of thyroid function studies: Secondary | ICD-10-CM | POA: Diagnosis not present

## 2013-10-10 ENCOUNTER — Ambulatory Visit (INDEPENDENT_AMBULATORY_CARE_PROVIDER_SITE_OTHER): Payer: Medicare Other | Admitting: General Surgery

## 2013-10-10 ENCOUNTER — Encounter (INDEPENDENT_AMBULATORY_CARE_PROVIDER_SITE_OTHER): Payer: Self-pay | Admitting: General Surgery

## 2013-10-10 VITALS — BP 128/78 | HR 71 | Temp 97.6°F | Resp 16 | Wt 158.8 lb

## 2013-10-10 DIAGNOSIS — C50912 Malignant neoplasm of unspecified site of left female breast: Secondary | ICD-10-CM

## 2013-10-10 DIAGNOSIS — C50919 Malignant neoplasm of unspecified site of unspecified female breast: Secondary | ICD-10-CM

## 2013-10-10 NOTE — Progress Notes (Signed)
Chief complaint: Followup breast cancer   History: Patient returns for routine long-term followup for T2N0 cancer of the left breast status post left mastectomy with negative sentinel lymph node biopsy and subsequent tamoxifen. She is approaching 3 1/2 years following diagnosis. She reports no chest wall masses or pain or arm swelling or other concerns.   Exam:  Gen.: An anxious but otherwise well-appearing female  Skin: No rash or infection  Lungs: Clear equal breath sounds bilaterally  Lymph nodes: No cervical, supraclavicular, or axillary nodes palpable  Breasts: Status post left mastectomy with chest wall negative for skin changes or palpable masses. Axilla is negative. Right breast without masses or skin changes.   Imaging: Mammogram due next month  Assessment and plan: Doing well with no evidence of recurrent or new breast cancer. She will return in 6 months.

## 2013-10-31 DIAGNOSIS — F200 Paranoid schizophrenia: Secondary | ICD-10-CM | POA: Diagnosis not present

## 2013-11-07 DIAGNOSIS — Z853 Personal history of malignant neoplasm of breast: Secondary | ICD-10-CM | POA: Diagnosis not present

## 2013-11-07 DIAGNOSIS — Z1231 Encounter for screening mammogram for malignant neoplasm of breast: Secondary | ICD-10-CM | POA: Diagnosis not present

## 2013-12-02 ENCOUNTER — Telehealth: Payer: Self-pay | Admitting: Oncology

## 2013-12-02 NOTE — Telephone Encounter (Signed)
, °

## 2013-12-12 ENCOUNTER — Other Ambulatory Visit: Payer: Self-pay | Admitting: *Deleted

## 2013-12-12 ENCOUNTER — Ambulatory Visit: Payer: Medicare Other | Admitting: Oncology

## 2013-12-12 ENCOUNTER — Other Ambulatory Visit: Payer: Medicare Other

## 2013-12-12 DIAGNOSIS — C50912 Malignant neoplasm of unspecified site of left female breast: Secondary | ICD-10-CM

## 2013-12-13 ENCOUNTER — Other Ambulatory Visit (HOSPITAL_BASED_OUTPATIENT_CLINIC_OR_DEPARTMENT_OTHER): Payer: Medicare Other

## 2013-12-13 ENCOUNTER — Encounter: Payer: Self-pay | Admitting: Hematology and Oncology

## 2013-12-13 ENCOUNTER — Telehealth: Payer: Self-pay | Admitting: *Deleted

## 2013-12-13 ENCOUNTER — Ambulatory Visit (HOSPITAL_BASED_OUTPATIENT_CLINIC_OR_DEPARTMENT_OTHER): Payer: Medicare Other | Admitting: Hematology and Oncology

## 2013-12-13 VITALS — BP 156/54 | HR 75 | Resp 18 | Ht 64.0 in | Wt 158.7 lb

## 2013-12-13 DIAGNOSIS — C50319 Malignant neoplasm of lower-inner quadrant of unspecified female breast: Secondary | ICD-10-CM

## 2013-12-13 DIAGNOSIS — Z170 Estrogen receptor positive status [ER+]: Secondary | ICD-10-CM

## 2013-12-13 DIAGNOSIS — C50912 Malignant neoplasm of unspecified site of left female breast: Secondary | ICD-10-CM

## 2013-12-13 LAB — COMPREHENSIVE METABOLIC PANEL (CC13)
ALT: 16 U/L (ref 0–55)
AST: 22 U/L (ref 5–34)
Albumin: 3.5 g/dL (ref 3.5–5.0)
Alkaline Phosphatase: 55 U/L (ref 40–150)
Anion Gap: 7 mEq/L (ref 3–11)
BUN: 22.5 mg/dL (ref 7.0–26.0)
CO2: 30 mEq/L — ABNORMAL HIGH (ref 22–29)
Calcium: 9.5 mg/dL (ref 8.4–10.4)
Chloride: 103 mEq/L (ref 98–109)
Creatinine: 1 mg/dL (ref 0.6–1.1)
Glucose: 182 mg/dl — ABNORMAL HIGH (ref 70–140)
Potassium: 4.9 mEq/L (ref 3.5–5.1)
Sodium: 139 mEq/L (ref 136–145)
Total Bilirubin: 0.35 mg/dL (ref 0.20–1.20)
Total Protein: 6.4 g/dL (ref 6.4–8.3)

## 2013-12-13 LAB — CBC WITH DIFFERENTIAL/PLATELET
BASO%: 0.2 % (ref 0.0–2.0)
Basophils Absolute: 0 10*3/uL (ref 0.0–0.1)
EOS%: 1 % (ref 0.0–7.0)
Eosinophils Absolute: 0.1 10*3/uL (ref 0.0–0.5)
HCT: 38.3 % (ref 34.8–46.6)
HGB: 12.5 g/dL (ref 11.6–15.9)
LYMPH%: 39.4 % (ref 14.0–49.7)
MCH: 29.3 pg (ref 25.1–34.0)
MCHC: 32.6 g/dL (ref 31.5–36.0)
MCV: 89.7 fL (ref 79.5–101.0)
MONO#: 0.4 10*3/uL (ref 0.1–0.9)
MONO%: 6.9 % (ref 0.0–14.0)
NEUT#: 3 10*3/uL (ref 1.5–6.5)
NEUT%: 52.5 % (ref 38.4–76.8)
Platelets: 146 10*3/uL (ref 145–400)
RBC: 4.27 10*6/uL (ref 3.70–5.45)
RDW: 13.6 % (ref 11.2–14.5)
WBC: 5.8 10*3/uL (ref 3.9–10.3)
lymph#: 2.3 10*3/uL (ref 0.9–3.3)

## 2013-12-13 NOTE — Assessment & Plan Note (Addendum)
Left breast cancer T2, N0, M0 stage II A. ER/PR positive HER-2 negative status post left mastectomy and currently on antiestrogen therapy with tamoxifen for the past 4 years. She is tolerating this treatment fairly well without any major problems. She denies any hot flashes or muscle aches or pains. She lives in a group home and stays fairly active. I reviewed her mammogram reports which was normal on the right breast. Her breast exam was also normal no evidence of any lumps or nodules. I recommended a six-month followup for physical exam.  I reviewed the blood work there were no abnormalities of CBC and CMP. Except for a slightly increased blood sugar 182. I did not recommend any aggressive dietary modifications, because this is postprandial.  Patient was given the following survivorship care plan  Annual mammograms for breast cancer surveillance with annual physical exams. Counseled the patient on diet and exercise. Encouraged eating more fruits and vegetables and less red meat. Encouraged patient to exercise daily for 30 minutes. Patient does yoga and participates in the book clubs The following health screenings were recommended Annual Pap smears Regular cancer screening for other cancers as recommended by PCP

## 2013-12-13 NOTE — Progress Notes (Signed)
Patient Care Team: Vidal Schwalbe, MD as PCP - General (Family Medicine)  DIAGNOSIS: Cancer of left breast   Primary site: Breast (Left)   Staging method: AJCC 7th Edition   Clinical: (T2, N0, cM0)   Pathologic: Stage IIA (T2, N0, cM0) signed by Deatra Robinson, MD on 06/13/2013 10:27 AM   Summary: Stage IIA (T2, N0, cM0)   SUMMARY OF ONCOLOGIC HISTORY:   Cancer of left breast   10/22/2009 Mammogram  2 masses in the anterior aspect of the left breast.  1.9 x 1.4 cm. and 1.6 x 1.2 cm. 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 She has been on tamoxifen for the past 4 years. Initially she had a lot of hot flashes achiness but they have subsided and she feels much better currently. She denies any new complaints or concerns. She is accompanied by her sister. She lives in a group home and stays relatively active with participating in yoga and book clubs and [employer].   REVIEW OF SYSTEMS:   Constitutional: Denies fevers, chills or abnormal weight loss Eyes: Denies blurriness of vision Ears, nose, mouth, throat, and face: Denies mucositis or sore throat Respiratory: Denies  cough, dyspnea or wheezes Cardiovascular: Denies palpitation, chest discomfort or lower extremity swelling Gastrointestinal:  Denies nausea, heartburn or change in bowel habits Skin: Denies abnormal skin rashes Lymphatics: Denies new lymphadenopathy or easy bruising Neurological:Denies numbness, tingling or new weaknesses Behavioral/Psych: Mood is stable, no new changes  Breast: denies any pain or lumps or nodules in either breasts All other systems were reviewed with the patient and are negative.  I have reviewed the past medical history, past surgical history, social history and family history with the patient and they are unchanged from previous note.  ALLERGIES:  has No Known Allergies.  MEDICATIONS:  Current Outpatient Prescriptions  Medication Sig Dispense Refill  . acetaminophen (TYLENOL) 500 MG tablet Take 500 mg by mouth every 6 (six) hours as needed.        Marland Kitchen amLODipine (NORVASC) 5 MG tablet Take 5 mg by mouth daily.        Marland Kitchen atenolol (TENORMIN) 50 MG tablet Take 50 mg by mouth daily.        . Calcium Carb-Cholecalciferol (RA CALCIUM 600/VITAMIN D-3) 600-400 MG-UNIT TABS Take 1 tablet by mouth 2 (two) times daily.  90 tablet  0  . calcium carbonate (TUMS - DOSED IN MG ELEMENTAL CALCIUM) 500 MG chewable tablet Chew 1 tablet by mouth daily.        . Calcium Carbonate-Vitamin D (CALCIUM 600/VITAMIN D) 600-400 MG-UNIT per tablet Take 1 tablet by mouth daily.  30 tablet  12  .  Cholecalciferol (VITAMIN D3) 2000 UNITS capsule Take 1 capsule (2,000 Units total) by mouth daily.  30 capsule  12  . divalproex (DEPAKOTE) 500 MG DR tablet Take 1,500 mg by mouth at bedtime.       . fexofenadine (ALLEGRA) 180 MG tablet Take 180 mg by mouth as needed.       Marland Kitchen losartan (COZAAR) 100 MG tablet Take 100 mg by mouth daily.        . Multiple Vitamins-Minerals (MULTIVITAMIN WITH MINERALS) tablet Take 1 tablet by mouth daily.        Marland Kitchen OLANZapine (ZYPREXA) 5 MG tablet Take 5 mg by mouth at bedtime. One  in the am and three at bedtime.      . pioglitazone (ACTOS) 30 MG tablet Take 30 mg by mouth daily.        . simvastatin (ZOCOR) 10 MG tablet Take 10 mg by mouth daily.      . tamoxifen (NOLVADEX) 20 MG tablet Take 1 tablet (20 mg total) by mouth daily.  90 tablet  4   No current facility-administered medications for this visit.    PHYSICAL EXAMINATION: ECOG PERFORMANCE STATUS: 1 - Symptomatic but completely ambulatory  Filed Vitals:   12/13/13 1437  BP: 156/54  Pulse: 75  Resp: 18   Filed Weights   12/13/13 1437  Weight: 158 lb 11.2 oz (71.986 kg)    GENERAL:alert, no distress and comfortable SKIN: skin color, texture, turgor are normal, no rashes or significant lesions EYES: normal, Conjunctiva are pink and non-injected, sclera clear OROPHARYNX:no exudate, no erythema and lips, buccal mucosa, and tongue normal  NECK: supple, thyroid normal size, non-tender, without nodularity LYMPH:  no palpable lymphadenopathy in the cervical, axillary or inguinal LUNGS: clear to auscultation and percussion with normal breathing effort HEART: regular rate & rhythm and no murmurs and no lower extremity edema ABDOMEN:abdomen soft, non-tender and normal bowel sounds Musculoskeletal:no cyanosis of digits and no clubbing  NEURO: alert & oriented x 3 with fluent speech, no focal motor/sensory deficits BREAST: No palpable masses lungs or nodules in either right or left breasts. Left breast scar is palpated and mastectomy scar does not have any nodules. No palpable axillary supraclavicular or infraclavicular adenopathy no breast tenderness or nipple discharge.   LABORATORY DATA:  I have reviewed the data as listed Appointment on 12/13/2013  Component Date Value Ref Range Status  . Sodium 12/13/2013 139  136 - 145 mEq/L Final  . Potassium 12/13/2013 4.9  3.5 - 5.1 mEq/L Final  . Chloride 12/13/2013 103  98 - 109 mEq/L Final  . CO2 12/13/2013 30* 22 - 29 mEq/L Final  . Glucose 12/13/2013 182* 70  - 140 mg/dl Final  . BUN 12/13/2013 22.5  7.0 - 26.0 mg/dL Final  . Creatinine 12/13/2013 1.0  0.6 - 1.1 mg/dL Final  . Total Bilirubin 12/13/2013 0.35  0.20 - 1.20 mg/dL Final  . Alkaline Phosphatase 12/13/2013 55  40 - 150 U/L Final  . AST 12/13/2013 22  5 - 34 U/L Final  . ALT 12/13/2013 16  0 - 55 U/L Final  . Total Protein 12/13/2013 6.4  6.4 - 8.3 g/dL Final  . Albumin 12/13/2013 3.5  3.5 - 5.0 g/dL Final  . Calcium 12/13/2013 9.5  8.4 - 10.4 mg/dL Final  . Anion Gap 12/13/2013 7  3 - 11 mEq/L Final  . WBC 12/13/2013 5.8  3.9 - 10.3 10e3/uL Final  . NEUT# 12/13/2013 3.0  1.5 - 6.5 10e3/uL Final  .  HGB 12/13/2013 12.5  11.6 - 15.9 g/dL Final  . HCT 12/13/2013 38.3  34.8 - 46.6 % Final  . Platelets 12/13/2013 146  145 - 400 10e3/uL Final  . MCV 12/13/2013 89.7  79.5 - 101.0 fL Final  . MCH 12/13/2013 29.3  25.1 - 34.0 pg Final  . MCHC 12/13/2013 32.6  31.5 - 36.0 g/dL Final  . RBC 12/13/2013 4.27  3.70 - 5.45 10e6/uL Final  . RDW 12/13/2013 13.6  11.2 - 14.5 % Final  . lymph# 12/13/2013 2.3  0.9 - 3.3 10e3/uL Final  . MONO# 12/13/2013 0.4  0.1 - 0.9 10e3/uL Final  . Eosinophils Absolute 12/13/2013 0.1  0.0 - 0.5 10e3/uL Final  . Basophils Absolute 12/13/2013 0.0  0.0 - 0.1 10e3/uL Final  . NEUT% 12/13/2013 52.5  38.4 - 76.8 % Final  . LYMPH% 12/13/2013 39.4  14.0 - 49.7 % Final  . MONO% 12/13/2013 6.9  0.0 - 14.0 % Final  . EOS% 12/13/2013 1.0  0.0 - 7.0 % Final  . BASO% 12/13/2013 0.2  0.0 - 2.0 % Final    RADIOGRAPHIC STUDIES: Mammogram done [facility] on 12-06-13 was normal No results found.   ASSESSMENT & PLAN:  Cancer of left breast Left breast cancer T2, N0, M0 stage II A. ER/PR positive HER-2 negative status post left mastectomy and currently on antiestrogen therapy with tamoxifen for the past 4 years. She is tolerating this treatment fairly well without any major problems. She denies any hot flashes or muscle aches or pains. She lives in a group home and stays fairly  active. I reviewed her mammogram reports which was normal on the right breast. Her breast exam was also normal no evidence of any lumps or nodules. I recommended a six-month followup for physical exam.  I reviewed the blood work there were no abnormalities of CBC and CMP. Except for a slightly increased blood sugar 182. I did not recommend any aggressive dietary modifications, because this is postprandial.  Patient was given the following survivorship care plan  Annual mammograms for breast cancer surveillance with annual physical exams. Counseled the patient on diet and exercise. Encouraged eating more fruits and vegetables and less red meat. Encouraged patient to exercise daily for 30 minutes. Patient does yoga and participates in the book clubs The following health screenings were recommended Annual Pap smears Regular cancer screening for other cancers as recommended by PCP    No orders of the defined types were placed in this encounter.   The patient has a good understanding of the overall plan. she agrees with it. She will call with any problems that may develop before her next visit here.  I spent 25 minutes counseling the patient face to face. The total time spent in the appointment was 30 minutes and more than 50% was on counseling and review of test results    Rulon Eisenmenger, MD 12/13/2013 3:10 PM

## 2013-12-13 NOTE — Telephone Encounter (Signed)
Received mammogram from [facility], sent to scan

## 2013-12-14 ENCOUNTER — Telehealth: Payer: Self-pay | Admitting: Hematology and Oncology

## 2013-12-14 NOTE — Telephone Encounter (Signed)
, °

## 2013-12-15 ENCOUNTER — Other Ambulatory Visit: Payer: Self-pay | Admitting: *Deleted

## 2013-12-15 DIAGNOSIS — C50912 Malignant neoplasm of unspecified site of left female breast: Secondary | ICD-10-CM

## 2013-12-15 DIAGNOSIS — E559 Vitamin D deficiency, unspecified: Secondary | ICD-10-CM

## 2013-12-15 DIAGNOSIS — Z79899 Other long term (current) drug therapy: Secondary | ICD-10-CM

## 2013-12-19 ENCOUNTER — Other Ambulatory Visit: Payer: Self-pay

## 2013-12-22 ENCOUNTER — Telehealth: Payer: Self-pay

## 2013-12-22 NOTE — Telephone Encounter (Signed)
Bone density report received from [facility] 02/07/13.  Copy to [place].  Original to scan.

## 2013-12-26 NOTE — Telephone Encounter (Signed)
chartin error

## 2014-01-02 DIAGNOSIS — E785 Hyperlipidemia, unspecified: Secondary | ICD-10-CM | POA: Diagnosis not present

## 2014-01-02 DIAGNOSIS — F209 Schizophrenia, unspecified: Secondary | ICD-10-CM | POA: Diagnosis not present

## 2014-01-02 DIAGNOSIS — I10 Essential (primary) hypertension: Secondary | ICD-10-CM | POA: Diagnosis not present

## 2014-01-02 DIAGNOSIS — Z111 Encounter for screening for respiratory tuberculosis: Secondary | ICD-10-CM | POA: Diagnosis not present

## 2014-01-02 DIAGNOSIS — Z853 Personal history of malignant neoplasm of breast: Secondary | ICD-10-CM | POA: Diagnosis not present

## 2014-01-02 DIAGNOSIS — F200 Paranoid schizophrenia: Secondary | ICD-10-CM | POA: Diagnosis not present

## 2014-01-02 DIAGNOSIS — M899 Disorder of bone, unspecified: Secondary | ICD-10-CM | POA: Diagnosis not present

## 2014-01-02 DIAGNOSIS — E119 Type 2 diabetes mellitus without complications: Secondary | ICD-10-CM | POA: Diagnosis not present

## 2014-01-02 DIAGNOSIS — Z23 Encounter for immunization: Secondary | ICD-10-CM | POA: Diagnosis not present

## 2014-01-02 DIAGNOSIS — Z0000 Encounter for general adult medical examination without abnormal findings: Secondary | ICD-10-CM | POA: Diagnosis not present

## 2014-01-12 DIAGNOSIS — Z1211 Encounter for screening for malignant neoplasm of colon: Secondary | ICD-10-CM | POA: Diagnosis not present

## 2014-01-23 DIAGNOSIS — F200 Paranoid schizophrenia: Secondary | ICD-10-CM | POA: Diagnosis not present

## 2014-02-27 DIAGNOSIS — F200 Paranoid schizophrenia: Secondary | ICD-10-CM | POA: Diagnosis not present

## 2014-03-02 ENCOUNTER — Ambulatory Visit
Admission: RE | Admit: 2014-03-02 | Discharge: 2014-03-02 | Disposition: A | Discharge status: Home / Self Care | Payer: Medicare Other | Source: Ambulatory Visit | Attending: Family Medicine | Admitting: Family Medicine

## 2014-03-02 ENCOUNTER — Other Ambulatory Visit: Payer: Self-pay | Admitting: Family Medicine

## 2014-03-02 DIAGNOSIS — M25532 Pain in left wrist: Secondary | ICD-10-CM

## 2014-03-02 DIAGNOSIS — S60212A Contusion of left wrist, initial encounter: Secondary | ICD-10-CM | POA: Diagnosis not present

## 2014-03-13 DIAGNOSIS — H02834 Dermatochalasis of left upper eyelid: Secondary | ICD-10-CM | POA: Diagnosis not present

## 2014-03-13 DIAGNOSIS — E119 Type 2 diabetes mellitus without complications: Secondary | ICD-10-CM | POA: Diagnosis not present

## 2014-03-13 DIAGNOSIS — H02831 Dermatochalasis of right upper eyelid: Secondary | ICD-10-CM | POA: Diagnosis not present

## 2014-04-24 DIAGNOSIS — F200 Paranoid schizophrenia: Secondary | ICD-10-CM | POA: Diagnosis not present

## 2014-04-30 DIAGNOSIS — J069 Acute upper respiratory infection, unspecified: Secondary | ICD-10-CM | POA: Diagnosis not present

## 2014-05-03 ENCOUNTER — Other Ambulatory Visit (INDEPENDENT_AMBULATORY_CARE_PROVIDER_SITE_OTHER): Payer: Self-pay | Admitting: General Surgery

## 2014-05-03 DIAGNOSIS — C50912 Malignant neoplasm of unspecified site of left female breast: Secondary | ICD-10-CM | POA: Diagnosis not present

## 2014-06-15 ENCOUNTER — Other Ambulatory Visit: Payer: Self-pay | Admitting: *Deleted

## 2014-06-15 DIAGNOSIS — C50912 Malignant neoplasm of unspecified site of left female breast: Secondary | ICD-10-CM

## 2014-06-19 ENCOUNTER — Telehealth: Payer: Self-pay | Admitting: Hematology and Oncology

## 2014-06-19 ENCOUNTER — Other Ambulatory Visit (HOSPITAL_BASED_OUTPATIENT_CLINIC_OR_DEPARTMENT_OTHER): Payer: Medicare Other

## 2014-06-19 ENCOUNTER — Ambulatory Visit (HOSPITAL_BASED_OUTPATIENT_CLINIC_OR_DEPARTMENT_OTHER): Payer: Medicare Other | Admitting: Hematology and Oncology

## 2014-06-19 ENCOUNTER — Other Ambulatory Visit: Payer: Self-pay | Admitting: *Deleted

## 2014-06-19 VITALS — BP 149/46 | HR 72 | Temp 97.9°F | Resp 18 | Ht 64.0 in | Wt 159.4 lb

## 2014-06-19 DIAGNOSIS — Z853 Personal history of malignant neoplasm of breast: Secondary | ICD-10-CM

## 2014-06-19 DIAGNOSIS — F200 Paranoid schizophrenia: Secondary | ICD-10-CM | POA: Diagnosis not present

## 2014-06-19 DIAGNOSIS — Z79818 Long term (current) use of other agents affecting estrogen receptors and estrogen levels: Secondary | ICD-10-CM

## 2014-06-19 DIAGNOSIS — C50912 Malignant neoplasm of unspecified site of left female breast: Secondary | ICD-10-CM

## 2014-06-19 DIAGNOSIS — E559 Vitamin D deficiency, unspecified: Secondary | ICD-10-CM

## 2014-06-19 LAB — COMPREHENSIVE METABOLIC PANEL (CC13)
ALT: 12 U/L (ref 0–55)
AST: 24 U/L (ref 5–34)
Albumin: 3.5 g/dL (ref 3.5–5.0)
Alkaline Phosphatase: 56 U/L (ref 40–150)
Anion Gap: 11 mEq/L (ref 3–11)
BUN: 23.5 mg/dL (ref 7.0–26.0)
CO2: 25 mEq/L (ref 22–29)
Calcium: 9.6 mg/dL (ref 8.4–10.4)
Chloride: 103 mEq/L (ref 98–109)
Creatinine: 0.9 mg/dL (ref 0.6–1.1)
EGFR: 68 mL/min/{1.73_m2} — ABNORMAL LOW (ref >90)
Glucose: 183 mg/dl — ABNORMAL HIGH (ref 70–140)
Potassium: 4.6 mEq/L (ref 3.5–5.1)
Sodium: 139 mEq/L (ref 136–145)
Total Bilirubin: 0.39 mg/dL (ref 0.20–1.20)
Total Protein: 6.1 g/dL — ABNORMAL LOW (ref 6.4–8.3)

## 2014-06-19 LAB — CBC WITH DIFFERENTIAL/PLATELET
BASO%: 0.4 % (ref 0.0–2.0)
Basophils Absolute: 0 10*3/uL (ref 0.0–0.1)
EOS%: 0.6 % (ref 0.0–7.0)
Eosinophils Absolute: 0 10*3/uL (ref 0.0–0.5)
HCT: 37.3 % (ref 34.8–46.6)
HGB: 12.5 g/dL (ref 11.6–15.9)
LYMPH%: 21.7 % (ref 14.0–49.7)
MCH: 30 pg (ref 25.1–34.0)
MCHC: 33.5 g/dL (ref 31.5–36.0)
MCV: 89.7 fL (ref 79.5–101.0)
MONO#: 0.4 10*3/uL (ref 0.1–0.9)
MONO%: 8.5 % (ref 0.0–14.0)
NEUT#: 3.6 10*3/uL (ref 1.5–6.5)
NEUT%: 68.8 % (ref 38.4–76.8)
Platelets: 143 10*3/uL — ABNORMAL LOW (ref 145–400)
RBC: 4.16 10*6/uL (ref 3.70–5.45)
RDW: 13.3 % (ref 11.2–14.5)
WBC: 5.2 10*3/uL (ref 3.9–10.3)
lymph#: 1.1 10*3/uL (ref 0.9–3.3)

## 2014-06-19 MED ORDER — VITAMIN D3 50 MCG (2000 UT) PO CAPS: 2000.0000 [IU] | ORAL_CAPSULE | Freq: Every day | ORAL | Status: DC

## 2014-06-19 NOTE — Assessment & Plan Note (Signed)
Left breast cancer T2, N0, M0 stage II A. ER/PR positive HER-2 negative status post left mastectomy and currently on antiestrogen therapy with tamoxifen since 01/12/2010  Tamoxifen toxicities: 1. Initially a lot of musculoskeletal aches and pains subsided 2. Patient stays very active by participating in yoga and book clubs in [employer]  Breast cancer surveillance: 1. Mammogram 11/07/2013 is normal 2. Breast exam 06/19/2014 is normal 3. Bone density October 2014 showed a T score of -2.3 in the femur  Osteopenia: I recommended that the patient should take calcium and vitamin D along with bisphosphonate therapy.  

## 2014-06-19 NOTE — Telephone Encounter (Signed)
appts made and avs printed for pt  Alexandra Lamb °

## 2014-06-19 NOTE — Progress Notes (Signed)
Patient Care Team: Vidal Schwalbe, MD as PCP - General (Family Medicine)  DIAGNOSIS: Cancer of left breast   Staging form: Breast, AJCC 7th Edition     Clinical: T2, N0, cM0 - Unsigned     Pathologic: Stage IIA (T2, N0, cM0) - Signed by Deatra Robinson, MD on 06/13/2013   SUMMARY OF ONCOLOGIC HISTORY:   Cancer of left breast   10/22/2009 Mammogram  2 masses in the anterior aspect of the left breast.  1.9 x 1.4 cm. and 1.6 x 1.2 cm. 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 Post-Op not felt to be a candidate for chemo or XRT    01/12/2010 -  Anti-estrogen oral therapy Tamoxifen 20 mg daily    CHIEF COMPLIANT: Follow-up on tamoxifen therapy  INTERVAL HISTORY: Alexandra Lamb is a 69 year old lady with above-mentioned history of left-sided breast cancer treated with mastectomy and is currently on oral antiestrogen therapy with tamoxifen. She is tolerating it fairly well without any major problems or concerns. She initially had a lot of muscle aches and pains which improved and she is tolerating it very well.  REVIEW OF SYSTEMS:   Constitutional: Denies fevers, chills or abnormal weight loss Eyes: Denies blurriness of vision Ears, nose, mouth, throat, and face: Denies mucositis or sore throat Respiratory: Denies cough, dyspnea or wheezes Cardiovascular: Denies palpitation, chest discomfort or lower extremity swelling Gastrointestinal:  Denies nausea, heartburn or change in bowel habits Skin: Denies abnormal skin rashes Lymphatics: Denies new lymphadenopathy or easy bruising Neurological:Denies  numbness, tingling or new weaknesses Behavioral/Psych: Mood is stable, no new changes  Breast:  denies any pain or lumps or nodules in either breasts All other systems were reviewed with the patient and are negative.  I have reviewed the past medical history, past surgical history, social history and family history with the patient and they are unchanged from previous note.  ALLERGIES:  has No Known Allergies.  MEDICATIONS:  Current Outpatient Prescriptions  Medication Sig Dispense Refill  . acetaminophen (TYLENOL) 500 MG tablet Take 500 mg by mouth every 6 (six) hours as needed.      Marland Kitchen amLODipine (NORVASC) 5 MG tablet Take 5 mg by mouth daily.      Marland Kitchen atenolol (TENORMIN) 50 MG tablet Take 50 mg by mouth daily.      . calcium carbonate (TUMS - DOSED IN MG ELEMENTAL CALCIUM) 500 MG chewable tablet Chew 1 tablet by mouth daily.      . Cholecalciferol (VITAMIN D3) 2000 UNITS capsule Take 1 capsule (2,000 Units total) by mouth daily. 90 capsule 3  . divalproex (DEPAKOTE) 500 MG DR tablet Take 1,500 mg by mouth at bedtime.     . fexofenadine (ALLEGRA) 180 MG tablet Take 180 mg by mouth as needed.     Marland Kitchen losartan (COZAAR) 100 MG tablet Take 100 mg by mouth daily.      . Multiple Vitamins-Minerals (MULTIVITAMIN WITH MINERALS) tablet Take 1 tablet by mouth daily.      Marland Kitchen OLANZapine (ZYPREXA) 5 MG tablet Take 5 mg by mouth at bedtime.  One in the am and three at bedtime.    . pioglitazone (ACTOS) 30 MG tablet Take 30 mg by mouth daily.      . simvastatin (ZOCOR) 10 MG tablet Take 10 mg by mouth daily.    . tamoxifen (NOLVADEX) 20 MG tablet Take 1 tablet (20 mg total) by mouth daily. 90 tablet 4   No current facility-administered medications for this visit.    PHYSICAL EXAMINATION: ECOG PERFORMANCE STATUS: 0 - Asymptomatic  Filed Vitals:   06/19/14 1053  BP: 149/46  Pulse: 72  Temp: 97.9 F (36.6 C)  Resp: 18   Filed Weights   06/19/14 1053  Weight: 159 lb 6.4 oz (72.303 kg)     GENERAL:alert, no distress and comfortable SKIN: skin color, texture, turgor are normal, no rashes or significant lesions EYES: normal, Conjunctiva are pink and non-injected, sclera clear OROPHARYNX:no exudate, no erythema and lips, buccal mucosa, and tongue normal  NECK: supple, thyroid normal size, non-tender, without nodularity LYMPH:  no palpable lymphadenopathy in the cervical, axillary or inguinal LUNGS: clear to auscultation and percussion with normal breathing effort HEART: regular rate & rhythm and no murmurs and no lower extremity edema ABDOMEN:abdomen soft, non-tender and normal bowel sounds Musculoskeletal:no cyanosis of digits and no clubbing  NEURO: alert & oriented x 3 with fluent speech, no focal motor/sensory deficits BREAST: No palpable masses or nodules in either right or left breasts. No palpable axillary supraclavicular or infraclavicular adenopathy no breast tenderness or nipple discharge. (exam performed in the presence of a chaperone)  LABORATORY DATA:  I have reviewed the data as listed   Chemistry      Component Value Date/Time   NA 139 06/19/2014 1041   NA 141 10/06/2011 1127   K 4.6 06/19/2014 1041   K 5.0 10/06/2011 1127   CL 104 10/06/2011 1127   CO2 25 06/19/2014 1041   CO2 27 10/06/2011 1127   BUN 23.5 06/19/2014 1041   BUN 20 10/06/2011 1127   CREATININE 0.9 06/19/2014 1041   CREATININE 0.85 10/06/2011 1127      Component Value Date/Time   CALCIUM 9.6 06/19/2014 1041   CALCIUM 9.5 10/06/2011 1127   ALKPHOS 56 06/19/2014 1041   ALKPHOS 46 10/06/2011 1127   AST 24 06/19/2014 1041   AST 18 10/06/2011 1127   ALT 12 06/19/2014 1041   ALT 13 10/06/2011 1127   BILITOT 0.39 06/19/2014 1041   BILITOT 0.3 10/06/2011 1127       Lab Results  Component Value Date   WBC 5.2 06/19/2014   HGB 12.5 06/19/2014   HCT 37.3 06/19/2014   MCV 89.7 06/19/2014   PLT 143* 06/19/2014   NEUTROABS 3.6 06/19/2014     RADIOGRAPHIC STUDIES: I have  personally reviewed the radiology reports and agreed with their findings. Mammogram 11/07/2013 is normal  ASSESSMENT & PLAN:  Cancer of left breast Left breast cancer T2, N0, M0 stage II A. ER/PR positive HER-2 negative status post left mastectomy and currently on antiestrogen therapy with tamoxifen since 01/12/2010  Tamoxifen toxicities: 1. Initially a lot of musculoskeletal aches and pains subsided 2. Patient stays very active by participating in yoga and book clubs in [facility]  Breast cancer surveillance: 1. Mammogram 11/07/2013 is normal 2. Breast exam 06/19/2014 is normal 3. Bone density October 2014 showed a T score of -2.3 in the femur  Osteopenia: I recommended that the patient should take calcium and vitamin D along with bisphosphonate therapy.    No orders of the  defined types were placed in this encounter.   The patient has a good understanding of the overall plan. she agrees with it. She will call with any problems that may develop before her next visit here.   Rulon Eisenmenger, MD

## 2014-06-22 DIAGNOSIS — C50919 Malignant neoplasm of unspecified site of unspecified female breast: Secondary | ICD-10-CM | POA: Diagnosis not present

## 2014-06-22 DIAGNOSIS — E785 Hyperlipidemia, unspecified: Secondary | ICD-10-CM | POA: Diagnosis not present

## 2014-06-22 DIAGNOSIS — J069 Acute upper respiratory infection, unspecified: Secondary | ICD-10-CM | POA: Diagnosis not present

## 2014-06-22 DIAGNOSIS — E119 Type 2 diabetes mellitus without complications: Secondary | ICD-10-CM | POA: Diagnosis not present

## 2014-06-22 DIAGNOSIS — I10 Essential (primary) hypertension: Secondary | ICD-10-CM | POA: Diagnosis not present

## 2014-06-22 DIAGNOSIS — F209 Schizophrenia, unspecified: Secondary | ICD-10-CM | POA: Diagnosis not present

## 2014-06-26 ENCOUNTER — Other Ambulatory Visit: Payer: Self-pay

## 2014-06-26 DIAGNOSIS — C50912 Malignant neoplasm of unspecified site of left female breast: Secondary | ICD-10-CM

## 2014-06-26 MED ORDER — TAMOXIFEN CITRATE 20 MG PO TABS: 20.0000 mg | ORAL_TABLET | Freq: Every day | ORAL | Status: DC

## 2014-06-26 NOTE — Addendum Note (Signed)
Addended by: Prentiss Bells on: 06/26/2014 10:43 AM   Modules accepted: Orders

## 2014-07-02 ENCOUNTER — Other Ambulatory Visit: Payer: Self-pay | Admitting: *Deleted

## 2014-07-02 MED ORDER — CALCIUM CARBONATE-VITAMIN D 500-200 MG-UNIT PO TABS: 1.0000 | ORAL_TABLET | Freq: Every day | ORAL | Status: DC

## 2014-07-02 NOTE — Telephone Encounter (Signed)
Received a refill request for Calcium 600 + Vitamin D.  This patient is in a group home, [facility].  Even though this is an OTC medication, they must have a prescription on hand in order to give it to her.  Rx sent electronically to [facility], copy faxed to [facility].

## 2014-08-16 DIAGNOSIS — J309 Allergic rhinitis, unspecified: Secondary | ICD-10-CM | POA: Diagnosis not present

## 2014-08-22 DIAGNOSIS — F200 Paranoid schizophrenia: Secondary | ICD-10-CM | POA: Diagnosis not present

## 2014-08-27 DIAGNOSIS — F200 Paranoid schizophrenia: Secondary | ICD-10-CM | POA: Diagnosis not present

## 2014-09-24 DIAGNOSIS — F200 Paranoid schizophrenia: Secondary | ICD-10-CM | POA: Diagnosis not present

## 2014-11-01 DIAGNOSIS — C50912 Malignant neoplasm of unspecified site of left female breast: Secondary | ICD-10-CM | POA: Diagnosis not present

## 2014-11-22 DIAGNOSIS — F200 Paranoid schizophrenia: Secondary | ICD-10-CM | POA: Diagnosis not present

## 2014-11-27 DIAGNOSIS — Z853 Personal history of malignant neoplasm of breast: Secondary | ICD-10-CM | POA: Diagnosis not present

## 2014-11-27 DIAGNOSIS — Z1231 Encounter for screening mammogram for malignant neoplasm of breast: Secondary | ICD-10-CM | POA: Diagnosis not present

## 2014-11-27 DIAGNOSIS — M8580 Other specified disorders of bone density and structure, unspecified site: Secondary | ICD-10-CM | POA: Diagnosis not present

## 2014-12-04 DIAGNOSIS — Z23 Encounter for immunization: Secondary | ICD-10-CM | POA: Diagnosis not present

## 2014-12-25 ENCOUNTER — Telehealth: Payer: Self-pay | Admitting: Hematology and Oncology

## 2014-12-25 ENCOUNTER — Ambulatory Visit (HOSPITAL_BASED_OUTPATIENT_CLINIC_OR_DEPARTMENT_OTHER): Payer: Medicare Other | Admitting: Hematology and Oncology

## 2014-12-25 ENCOUNTER — Encounter: Payer: Self-pay | Admitting: Hematology and Oncology

## 2014-12-25 VITALS — HR 78 | Temp 98.2°F | Resp 17 | Ht 64.0 in | Wt 154.0 lb

## 2014-12-25 DIAGNOSIS — Z170 Estrogen receptor positive status [ER+]: Secondary | ICD-10-CM | POA: Diagnosis not present

## 2014-12-25 DIAGNOSIS — C50912 Malignant neoplasm of unspecified site of left female breast: Secondary | ICD-10-CM | POA: Diagnosis not present

## 2014-12-25 DIAGNOSIS — Z79810 Long term (current) use of selective estrogen receptor modulators (SERMs): Secondary | ICD-10-CM

## 2014-12-25 DIAGNOSIS — M8580 Other specified disorders of bone density and structure, unspecified site: Secondary | ICD-10-CM

## 2014-12-25 DIAGNOSIS — E559 Vitamin D deficiency, unspecified: Secondary | ICD-10-CM

## 2014-12-25 MED ORDER — VITAMIN D3 50 MCG (2000 UT) PO CAPS: 2000.0000 [IU] | ORAL_CAPSULE | Freq: Every day | ORAL | Status: DC

## 2014-12-25 MED ORDER — CALCIUM CARBONATE-VITAMIN D 500-200 MG-UNIT PO TABS: 1.0000 | ORAL_TABLET | Freq: Every day | ORAL | Status: DC

## 2014-12-25 MED ORDER — TAMOXIFEN CITRATE 20 MG PO TABS: 20.0000 mg | ORAL_TABLET | Freq: Every day | ORAL | Status: DC

## 2014-12-25 NOTE — Progress Notes (Signed)
Bone density and mammogram results rcvd from [facility].  Reviewed by Dr. Lindi Adie.  Sent to scan.

## 2014-12-25 NOTE — Assessment & Plan Note (Signed)
Left breast cancer T2, N0, M0 stage II A. ER/PR positive HER-2 negative status post left mastectomy and currently on antiestrogen therapy with tamoxifen since 01/12/2010  Tamoxifen toxicities: 1. Initially a lot of musculoskeletal aches and pains subsided 2. Patient stays very active by participating in yoga and book clubs in [facility]  Breast cancer surveillance: 1. Mammogram 11/09/2014 is normal 2. Breast exam 12/25/2014 is normal 3. Bone density October 2014 showed a T score of -2.3 in the femur  Osteopenia: takes bisphosphonates along with calcium and vitamin D We discussed the role of extended adjuvant therapy. In order to figure out if she would need to stay on antiestrogen therapy beyond 5 years, I would like to obtain breast cancer index.  Return to clinic in 1 year for follow-up.

## 2014-12-25 NOTE — Telephone Encounter (Signed)
Gave avs & calendar for September 2017 °

## 2014-12-25 NOTE — Progress Notes (Signed)
Patient Care Team: Harlan Stains, MD as PCP - General (Family Medicine)  DIAGNOSIS: Cancer of left breast   Staging form: Breast, AJCC 7th Edition     Clinical: T2, N0, cM0 - Unsigned     Pathologic: Stage IIA (T2, N0, cM0) - Signed by Deatra Robinson, MD on 06/13/2013   SUMMARY OF ONCOLOGIC HISTORY:   Cancer of left breast   10/22/2009 Mammogram  2 masses in the anterior aspect of the left breast.  1.9 x 1.4 cm. and 1.6 x 1.2 cm. 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 Post-Op not felt to be a candidate for chemo or XRT    01/12/2010 -  Anti-estrogen oral therapy Tamoxifen 20 mg daily    CHIEF COMPLIANT: follow-up on tamoxifen  INTERVAL HISTORY: Alexandra PHILIPPS is a 69 year old lady with above-mentioned history of left breast cancer currently on tamoxifen therapy since 2011. She completed 5 years of treatment. She reports no major problems or concerns with tamoxifen. She does not want to come off tamoxifen. Denies any significant hot flashes or myalgias.denies any lumps or nodules noted breast.  REVIEW OF SYSTEMS:   Constitutional: Denies fevers, chills or abnormal weight loss Eyes: Denies blurriness of vision Ears, nose, mouth, throat, and face: Denies mucositis or sore throat Respiratory: Denies cough, dyspnea or wheezes Cardiovascular: Denies palpitation, chest discomfort or lower extremity swelling Gastrointestinal:  Denies nausea, heartburn or change in bowel habits Skin: Denies abnormal skin rashes Lymphatics: Denies new lymphadenopathy or easy bruising Neurological:Denies numbness,  tingling or new weaknesses Behavioral/Psych: Mood is stable, no new changes  Breast:  denies any pain or lumps or nodules in either breasts All other systems were reviewed with the patient and are negative.  I have reviewed the past medical history, past surgical history, social history and family history with the patient and they are unchanged from previous note.  ALLERGIES:  has No Known Allergies.  MEDICATIONS:  Current Outpatient Prescriptions  Medication Sig Dispense Refill  . acetaminophen (TYLENOL) 500 MG tablet Take 500 mg by mouth every 6 (six) hours as needed.      Marland Kitchen amLODipine (NORVASC) 5 MG tablet Take 5 mg by mouth daily.      Marland Kitchen atenolol (TENORMIN) 50 MG tablet Take 50 mg by mouth daily.      . calcium carbonate (TUMS - DOSED IN MG ELEMENTAL CALCIUM) 500 MG chewable tablet Chew 1 tablet by mouth daily.      . calcium-vitamin D (OSCAL WITH D) 500-200 MG-UNIT per tablet Take 1 tablet by mouth daily. 90 tablet 3  . Cholecalciferol (VITAMIN D3) 2000 UNITS capsule Take 1 capsule (2,000 Units total) by mouth daily. 90 capsule 3  . divalproex (DEPAKOTE) 500 MG DR tablet Take 1,500 mg by mouth at bedtime.     . fexofenadine (ALLEGRA) 180 MG tablet Take 180 mg by mouth as needed.     Marland Kitchen losartan (COZAAR) 100 MG tablet Take 100 mg by mouth daily.      . Multiple Vitamins-Minerals (MULTIVITAMIN WITH MINERALS) tablet Take 1 tablet by mouth daily.      Marland Kitchen  OLANZapine (ZYPREXA) 5 MG tablet Take 5 mg by mouth at bedtime. One in the am and three at bedtime.    . pioglitazone (ACTOS) 30 MG tablet Take 30 mg by mouth daily.      Marland Kitchen RA COL-RITE 100 MG capsule   0  . simvastatin (ZOCOR) 10 MG tablet Take 10 mg by mouth daily.    . tamoxifen (NOLVADEX) 20 MG tablet Take 1 tablet (20 mg total) by mouth daily. 90 tablet 4   No current facility-administered medications for this visit.    PHYSICAL EXAMINATION: ECOG PERFORMANCE STATUS: 0 - Asymptomatic  Filed Vitals:   12/25/14 1051  Pulse: 78   Temp: 98.2 F (36.8 C)  Resp: 17   Filed Weights   12/25/14 1051  Weight: 154 lb (69.854 kg)    GENERAL:alert, no distress and comfortable SKIN: skin color, texture, turgor are normal, no rashes or significant lesions EYES: normal, Conjunctiva are pink and non-injected, sclera clear OROPHARYNX:no exudate, no erythema and lips, buccal mucosa, and tongue normal  NECK: supple, thyroid normal size, non-tender, without nodularity LYMPH:  no palpable lymphadenopathy in the cervical, axillary or inguinal LUNGS: clear to auscultation and percussion with normal breathing effort HEART: regular rate & rhythm and no murmurs and no lower extremity edema ABDOMEN:abdomen soft, non-tender and normal bowel sounds Musculoskeletal:no cyanosis of digits and no clubbing  NEURO: alert & oriented x 3 with fluent speech, no focal motor/sensory deficits BREAST: No palpable masses or nodules in either right or left breasts. No palpable axillary supraclavicular or infraclavicular adenopathy no breast tenderness or nipple discharge. (exam performed in the presence of a chaperone)  LABORATORY DATA:  I have reviewed the data as listed   Chemistry      Component Value Date/Time   NA 139 06/19/2014 1041   NA 141 10/06/2011 1127   K 4.6 06/19/2014 1041   K 5.0 10/06/2011 1127   CL 104 10/06/2011 1127   CO2 25 06/19/2014 1041   CO2 27 10/06/2011 1127   BUN 23.5 06/19/2014 1041   BUN 20 10/06/2011 1127   CREATININE 0.9 06/19/2014 1041   CREATININE 0.85 10/06/2011 1127      Component Value Date/Time   CALCIUM 9.6 06/19/2014 1041   CALCIUM 9.5 10/06/2011 1127   ALKPHOS 56 06/19/2014 1041   ALKPHOS 46 10/06/2011 1127   AST 24 06/19/2014 1041   AST 18 10/06/2011 1127   ALT 12 06/19/2014 1041   ALT 13 10/06/2011 1127   BILITOT 0.39 06/19/2014 1041   BILITOT 0.3 10/06/2011 1127       Lab Results  Component Value Date   WBC 5.2 06/19/2014   HGB 12.5 06/19/2014   HCT 37.3 06/19/2014   MCV 89.7  06/19/2014   PLT 143* 06/19/2014   NEUTROABS 3.6 06/19/2014   ASSESSMENT & PLAN:  Cancer of left breast Left breast cancer T2, N0, M0 stage II A. ER/PR positive HER-2 negative status post left mastectomy and currently on antiestrogen therapy with tamoxifen since 01/12/2010  Tamoxifen toxicities: 1. Initially a lot of musculoskeletal aches and pains subsided 2. Patient stays very active by participating in yoga and book clubs in [facility]  Breast cancer surveillance: 1. Mammogram 11/09/2014 is normal 2. Breast exam 12/25/2014 is normal 3. Bone density October 2014 showed a T score of -2.3 in the femur  Osteopenia: takes bisphosphonates along with calcium and vitamin D We discussed the role of extended adjuvant therapy. Patient completed 5 years of therapy and has requested that  she remain on tamoxifen for the next 5 years as well. Patient understands that the benefits are not entirely clear but it would carry substantial risks to her. She understands this but is willing to take tamoxifen for the next 5 years.  Return to clinic in 1 year for follow-up.   No orders of the defined types were placed in this encounter.   The patient has a good understanding of the overall plan. she agrees with it. she will call with any problems that may develop before the next visit here.   Rulon Eisenmenger, MD

## 2014-12-28 DIAGNOSIS — T148 Other injury of unspecified body region: Secondary | ICD-10-CM | POA: Diagnosis not present

## 2015-01-17 DIAGNOSIS — F200 Paranoid schizophrenia: Secondary | ICD-10-CM | POA: Diagnosis not present

## 2015-02-18 DIAGNOSIS — F200 Paranoid schizophrenia: Secondary | ICD-10-CM | POA: Diagnosis not present

## 2015-02-20 DIAGNOSIS — J309 Allergic rhinitis, unspecified: Secondary | ICD-10-CM | POA: Diagnosis not present

## 2015-02-20 DIAGNOSIS — Z1211 Encounter for screening for malignant neoplasm of colon: Secondary | ICD-10-CM | POA: Diagnosis not present

## 2015-02-20 DIAGNOSIS — F209 Schizophrenia, unspecified: Secondary | ICD-10-CM | POA: Diagnosis not present

## 2015-02-20 DIAGNOSIS — E785 Hyperlipidemia, unspecified: Secondary | ICD-10-CM | POA: Diagnosis not present

## 2015-02-20 DIAGNOSIS — K5900 Constipation, unspecified: Secondary | ICD-10-CM | POA: Diagnosis not present

## 2015-02-20 DIAGNOSIS — I10 Essential (primary) hypertension: Secondary | ICD-10-CM | POA: Diagnosis not present

## 2015-02-20 DIAGNOSIS — Z0000 Encounter for general adult medical examination without abnormal findings: Secondary | ICD-10-CM | POA: Diagnosis not present

## 2015-02-20 DIAGNOSIS — E119 Type 2 diabetes mellitus without complications: Secondary | ICD-10-CM | POA: Diagnosis not present

## 2015-02-20 DIAGNOSIS — Z111 Encounter for screening for respiratory tuberculosis: Secondary | ICD-10-CM | POA: Diagnosis not present

## 2015-02-21 ENCOUNTER — Telehealth: Payer: Self-pay | Admitting: *Deleted

## 2015-02-21 NOTE — Telephone Encounter (Signed)
[name] with [facility] [phone] called reporting "this patient lives in a facility,  saw her PCP yesterday complaining of constipation so he ordered to discontinue the Calcium plus D ordered by Dr. Lindi Adie because she can continue MVI which has 300 mg calcium and 800 IU vitamin D3.  Last week started one docusate sodium 100 mg daily.  Does he want her to stop?  We need an order from Dr. Lindi Adie before we can discontinue this medicine.  Her docusate can be increased if needed.  She eats and drinks well.  Goes a few days without BM and when she goes it is uncomfortable for her."

## 2015-02-25 NOTE — Telephone Encounter (Signed)
Called and informed that it was ok to stop the Calcium plus D that was ordered by Dr. Lindi Adie.

## 2015-03-19 DIAGNOSIS — E119 Type 2 diabetes mellitus without complications: Secondary | ICD-10-CM | POA: Diagnosis not present

## 2015-03-19 DIAGNOSIS — H02831 Dermatochalasis of right upper eyelid: Secondary | ICD-10-CM | POA: Diagnosis not present

## 2015-03-19 DIAGNOSIS — H02834 Dermatochalasis of left upper eyelid: Secondary | ICD-10-CM | POA: Diagnosis not present

## 2015-03-19 DIAGNOSIS — H2513 Age-related nuclear cataract, bilateral: Secondary | ICD-10-CM | POA: Diagnosis not present

## 2015-03-19 DIAGNOSIS — H40013 Open angle with borderline findings, low risk, bilateral: Secondary | ICD-10-CM | POA: Diagnosis not present

## 2015-03-26 DIAGNOSIS — F200 Paranoid schizophrenia: Secondary | ICD-10-CM | POA: Diagnosis not present

## 2015-03-28 DIAGNOSIS — Z1211 Encounter for screening for malignant neoplasm of colon: Secondary | ICD-10-CM | POA: Diagnosis not present

## 2015-04-12 DIAGNOSIS — H2513 Age-related nuclear cataract, bilateral: Secondary | ICD-10-CM | POA: Diagnosis not present

## 2015-04-12 DIAGNOSIS — H401133 Primary open-angle glaucoma, bilateral, severe stage: Secondary | ICD-10-CM | POA: Diagnosis not present

## 2015-05-17 DIAGNOSIS — H2513 Age-related nuclear cataract, bilateral: Secondary | ICD-10-CM | POA: Diagnosis not present

## 2015-07-04 DIAGNOSIS — H401123 Primary open-angle glaucoma, left eye, severe stage: Secondary | ICD-10-CM | POA: Diagnosis not present

## 2015-07-04 DIAGNOSIS — H401113 Primary open-angle glaucoma, right eye, severe stage: Secondary | ICD-10-CM | POA: Diagnosis not present

## 2015-08-01 DIAGNOSIS — H401123 Primary open-angle glaucoma, left eye, severe stage: Secondary | ICD-10-CM | POA: Diagnosis not present

## 2015-08-01 DIAGNOSIS — H401113 Primary open-angle glaucoma, right eye, severe stage: Secondary | ICD-10-CM | POA: Diagnosis not present

## 2015-08-07 DIAGNOSIS — C50912 Malignant neoplasm of unspecified site of left female breast: Secondary | ICD-10-CM | POA: Diagnosis not present

## 2015-08-13 ENCOUNTER — Other Ambulatory Visit: Payer: Self-pay | Admitting: General Surgery

## 2015-08-13 DIAGNOSIS — Z1231 Encounter for screening mammogram for malignant neoplasm of breast: Secondary | ICD-10-CM

## 2015-08-13 DIAGNOSIS — F200 Paranoid schizophrenia: Secondary | ICD-10-CM | POA: Diagnosis not present

## 2015-08-20 DIAGNOSIS — E119 Type 2 diabetes mellitus without complications: Secondary | ICD-10-CM | POA: Diagnosis not present

## 2015-08-20 DIAGNOSIS — E785 Hyperlipidemia, unspecified: Secondary | ICD-10-CM | POA: Diagnosis not present

## 2015-08-20 DIAGNOSIS — I10 Essential (primary) hypertension: Secondary | ICD-10-CM | POA: Diagnosis not present

## 2015-10-01 ENCOUNTER — Other Ambulatory Visit: Payer: Self-pay | Admitting: *Deleted

## 2015-10-01 DIAGNOSIS — C50912 Malignant neoplasm of unspecified site of left female breast: Secondary | ICD-10-CM

## 2015-10-01 MED ORDER — TAMOXIFEN CITRATE 20 MG PO TABS: 20.0000 mg | ORAL_TABLET | Freq: Every day | ORAL | Status: DC

## 2015-12-03 DIAGNOSIS — Z1231 Encounter for screening mammogram for malignant neoplasm of breast: Secondary | ICD-10-CM | POA: Diagnosis not present

## 2015-12-03 DIAGNOSIS — Z853 Personal history of malignant neoplasm of breast: Secondary | ICD-10-CM | POA: Diagnosis not present

## 2015-12-24 ENCOUNTER — Encounter: Payer: Self-pay | Admitting: Hematology and Oncology

## 2015-12-24 ENCOUNTER — Telehealth: Payer: Self-pay | Admitting: Hematology and Oncology

## 2015-12-24 ENCOUNTER — Ambulatory Visit (HOSPITAL_BASED_OUTPATIENT_CLINIC_OR_DEPARTMENT_OTHER): Payer: Medicare Other | Admitting: Hematology and Oncology

## 2015-12-24 DIAGNOSIS — C50412 Malignant neoplasm of upper-outer quadrant of left female breast: Secondary | ICD-10-CM | POA: Diagnosis not present

## 2015-12-24 DIAGNOSIS — Z170 Estrogen receptor positive status [ER+]: Secondary | ICD-10-CM

## 2015-12-24 DIAGNOSIS — Z79810 Long term (current) use of selective estrogen receptor modulators (SERMs): Secondary | ICD-10-CM

## 2015-12-24 DIAGNOSIS — M8580 Other specified disorders of bone density and structure, unspecified site: Secondary | ICD-10-CM

## 2015-12-24 NOTE — Assessment & Plan Note (Signed)
Left breast cancer T2, N0, M0 stage II A. ER/PR positive HER-2 negative status post left mastectomy and currently on antiestrogen therapy with tamoxifen since 01/12/2010  Tamoxifen toxicities: 1. Initially a lot of musculoskeletal aches and pains subsided 2. Patient stays very active by participating in yoga and book clubs in [facility]  Breast cancer surveillance: 1. Mammogram  is normal 2. Breast exam 12/24/2015 is normal 3. Bone density October 2014 showed a T score of -2.3 in the femur: Patient needs another bone density  Osteopenia: takes bisphosphonates along with calcium and vitamin D We discussed the role of extended adjuvant therapy. Patient completed 5 years of therapy and has requested that she remain on tamoxifen for the next 5 years as well.   Return to clinic in 1 year for follow-up.

## 2015-12-24 NOTE — Progress Notes (Signed)
Patient Care Team: Harlan Stains, MD as PCP - General (Family Medicine)  DIAGNOSIS: Breast cancer of upper-outer quadrant of left female breast [facility])   Staging form: Breast, AJCC 7th Edition   - Clinical: T2, N0, cM0 - Unsigned   - Pathologic: Stage IIA (T2, N0, cM0) - Signed by Deatra Robinson, MD on 06/13/2013  SUMMARY OF ONCOLOGIC HISTORY:   Breast cancer of upper-outer quadrant of left female breast ([place])   10/22/2009 Mammogram     2 masses in the anterior aspect of the left breast.  1.9 x 1.4 cm. and 1.6 x 1.2 cm. 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 Post-Op not felt to be a candidate for chemo or XRT       01/12/2010 -  Anti-estrogen oral therapy    Tamoxifen 20 mg daily       CHIEF COMPLIANT: Follow-up on tamoxifen therapy  INTERVAL HISTORY: Alexandra Lamb is a 70 year old with above-mentioned history of left breast cancer currently on adjuvant tamoxifen. She is tolerating it fairly well. She does not have any hot flashes or myalgias. She is at the group home and appears to be having a good time. She is working at the senior center as well. She does have some hearing impairment and slow processing of higher intellectual skills.  REVIEW OF SYSTEMS:   Constitutional: Denies fevers, chills or abnormal weight loss Eyes: Denies blurriness of vision Ears, nose, mouth, throat, and face: Denies mucositis or sore throat Respiratory: Denies cough, dyspnea or wheezes Cardiovascular: Denies palpitation, chest discomfort Gastrointestinal:  Denies nausea, heartburn or  change in bowel habits Skin: Denies abnormal skin rashes Lymphatics: Denies new lymphadenopathy or easy bruising Neurological:Denies numbness, tingling or new weaknesses Behavioral/Psych: Mood is stable, no new changes  Extremities: No lower extremity edema Breast:  denies any pain or lumps or nodules in either breasts All other systems were reviewed with the patient and are negative.  I have reviewed the past medical history, past surgical history, social history and family history with the patient and they are unchanged from previous note.  ALLERGIES:  has No Known Allergies.  MEDICATIONS:  Current Outpatient Prescriptions  Medication Sig Dispense Refill  . acetaminophen (TYLENOL) 500 MG tablet Take 500 mg by mouth every 6 (six) hours as needed.      Marland Kitchen amLODipine (NORVASC) 5 MG tablet Take 5 mg by mouth daily.      Marland Kitchen atenolol (TENORMIN) 50 MG tablet Take 50 mg by mouth daily.      . calcium carbonate (TUMS - DOSED IN MG ELEMENTAL CALCIUM) 500 MG chewable tablet Chew 1 tablet by mouth daily.      . calcium-vitamin D (OSCAL WITH D) 500-200 MG-UNIT per tablet Take 1 tablet by mouth daily. 90 tablet 3  . Cholecalciferol (VITAMIN D3) 2000 UNITS capsule Take 1 capsule (2,000 Units total) by mouth daily. 90 capsule 3  . divalproex (DEPAKOTE) 500 MG DR tablet Take 1,500 mg by mouth at bedtime.     Marland Kitchen  fexofenadine (ALLEGRA) 180 MG tablet Take 180 mg by mouth as needed.     Marland Kitchen losartan (COZAAR) 100 MG tablet Take 100 mg by mouth daily.      . Multiple Vitamins-Minerals (MULTIVITAMIN WITH MINERALS) tablet Take 1 tablet by mouth daily.      Marland Kitchen OLANZapine (ZYPREXA) 5 MG tablet Take 5 mg by mouth at bedtime. One in the am and three at bedtime.    . pioglitazone (ACTOS) 30 MG tablet Take 30 mg by mouth daily.      Marland Kitchen RA COL-RITE 100 MG capsule   0  . simvastatin (ZOCOR) 10 MG tablet Take 10 mg by mouth daily.    . tamoxifen (NOLVADEX) 20 MG tablet Take 1 tablet (20 mg total) by mouth daily. 90 tablet 3     No current facility-administered medications for this visit.     PHYSICAL EXAMINATION: ECOG PERFORMANCE STATUS: 1 - Symptomatic but completely ambulatory  Vitals:   12/24/15 1105  BP: (!) 172/69  Pulse: 64  Resp: 18  Temp: 97.5 F (36.4 C)   Filed Weights   12/24/15 1105  Weight: 153 lb 3.2 oz (69.5 kg)    GENERAL:alert, no distress and comfortable SKIN: skin color, texture, turgor are normal, no rashes or significant lesions EYES: normal, Conjunctiva are pink and non-injected, sclera clear OROPHARYNX:no exudate, no erythema and lips, buccal mucosa, and tongue normal  NECK: supple, thyroid normal size, non-tender, without nodularity LYMPH:  no palpable lymphadenopathy in the cervical, axillary or inguinal LUNGS: clear to auscultation and percussion with normal breathing effort HEART: regular rate & rhythm and no murmurs and no lower extremity edema ABDOMEN:abdomen soft, non-tender and normal bowel sounds MUSCULOSKELETAL:no cyanosis of digits and no clubbing  NEURO: alert & oriented x 3 with fluent speech, no focal motor/sensory deficits EXTREMITIES: No lower extremity edema BREAST: No palpable masses or nodules in either right or left breasts. No palpable axillary supraclavicular or infraclavicular adenopathy no breast tenderness or nipple discharge. (exam performed in the presence of a chaperone)  LABORATORY DATA:  I have reviewed the data as listed   Chemistry      Component Value Date/Time   NA 139 06/19/2014 1041   K 4.6 06/19/2014 1041   CL 104 10/06/2011 1127   CO2 25 06/19/2014 1041   BUN 23.5 06/19/2014 1041   CREATININE 0.9 06/19/2014 1041      Component Value Date/Time   CALCIUM 9.6 06/19/2014 1041   ALKPHOS 56 06/19/2014 1041   AST 24 06/19/2014 1041   ALT 12 06/19/2014 1041   BILITOT 0.39 06/19/2014 1041       Lab Results  Component Value Date   WBC 5.2 06/19/2014   HGB 12.5 06/19/2014   HCT 37.3 06/19/2014   MCV 89.7 06/19/2014   PLT 143  (L) 06/19/2014   NEUTROABS 3.6 06/19/2014     ASSESSMENT & PLAN:  Breast cancer of upper-outer quadrant of left female breast ([facility]) Left breast cancer T2, N0, M0 stage II A. ER/PR positive HER-2 negative status post left mastectomy and currently on antiestrogen therapy with tamoxifen since 01/12/2010  Tamoxifen toxicities: 1. Initially a lot of musculoskeletal aches and pains subsided 2. Patient stays very active by participating in yoga and book clubs in [facility]  Breast cancer surveillance: 1. Mammogram 12/03/2015: Benign, Breast density category C 2. Breast exam 12/24/2015 is normal 3. Bone density October 2014 showed a T score of -2.3 in the femur: Patient needs another bone density  Osteopenia: takes bisphosphonates  along with calcium and vitamin D We discussed the role of extended adjuvant therapy. Patient completed 5 years of therapy and has requested that she remain on tamoxifen for the next 5 years as well.   Return to clinic in 1 year for follow-up.   No orders of the defined types were placed in this encounter.  The patient has a good understanding of the overall plan. she agrees with it. she will call with any problems that may develop before the next visit here.   ,  K, MD 12/24/15    

## 2015-12-24 NOTE — Telephone Encounter (Signed)
appt made and avs printed °

## 2016-01-02 DIAGNOSIS — Z23 Encounter for immunization: Secondary | ICD-10-CM | POA: Diagnosis not present

## 2016-02-11 DIAGNOSIS — H401132 Primary open-angle glaucoma, bilateral, moderate stage: Secondary | ICD-10-CM | POA: Diagnosis not present

## 2016-03-06 ENCOUNTER — Other Ambulatory Visit: Payer: Self-pay | Admitting: Hematology and Oncology

## 2016-03-06 DIAGNOSIS — C50912 Malignant neoplasm of unspecified site of left female breast: Secondary | ICD-10-CM

## 2016-03-06 DIAGNOSIS — E559 Vitamin D deficiency, unspecified: Secondary | ICD-10-CM

## 2016-03-16 DIAGNOSIS — F200 Paranoid schizophrenia: Secondary | ICD-10-CM | POA: Diagnosis not present

## 2016-03-17 DIAGNOSIS — I10 Essential (primary) hypertension: Secondary | ICD-10-CM | POA: Diagnosis not present

## 2016-03-17 DIAGNOSIS — C50919 Malignant neoplasm of unspecified site of unspecified female breast: Secondary | ICD-10-CM | POA: Diagnosis not present

## 2016-03-17 DIAGNOSIS — Z1211 Encounter for screening for malignant neoplasm of colon: Secondary | ICD-10-CM | POA: Diagnosis not present

## 2016-03-17 DIAGNOSIS — E785 Hyperlipidemia, unspecified: Secondary | ICD-10-CM | POA: Diagnosis not present

## 2016-03-17 DIAGNOSIS — Z0000 Encounter for general adult medical examination without abnormal findings: Secondary | ICD-10-CM | POA: Diagnosis not present

## 2016-03-17 DIAGNOSIS — F209 Schizophrenia, unspecified: Secondary | ICD-10-CM | POA: Diagnosis not present

## 2016-03-17 DIAGNOSIS — Z111 Encounter for screening for respiratory tuberculosis: Secondary | ICD-10-CM | POA: Diagnosis not present

## 2016-03-17 DIAGNOSIS — E119 Type 2 diabetes mellitus without complications: Secondary | ICD-10-CM | POA: Diagnosis not present

## 2016-03-20 DIAGNOSIS — R946 Abnormal results of thyroid function studies: Secondary | ICD-10-CM | POA: Diagnosis not present

## 2016-03-20 DIAGNOSIS — E785 Hyperlipidemia, unspecified: Secondary | ICD-10-CM | POA: Diagnosis not present

## 2016-03-20 DIAGNOSIS — E119 Type 2 diabetes mellitus without complications: Secondary | ICD-10-CM | POA: Diagnosis not present

## 2016-03-31 ENCOUNTER — Other Ambulatory Visit: Payer: Self-pay

## 2016-03-31 DIAGNOSIS — E559 Vitamin D deficiency, unspecified: Secondary | ICD-10-CM

## 2016-03-31 MED ORDER — CHOLECALCIFEROL 50 MCG (2000 UT) PO CAPS: 1.0000 | ORAL_CAPSULE | Freq: Every day | ORAL | 3 refills | Status: DC

## 2016-03-31 MED ORDER — TAMOXIFEN CITRATE 20 MG PO TABS: 20.0000 mg | ORAL_TABLET | Freq: Every day | ORAL | 3 refills | Status: DC

## 2016-06-11 DIAGNOSIS — H2513 Age-related nuclear cataract, bilateral: Secondary | ICD-10-CM | POA: Diagnosis not present

## 2016-06-11 DIAGNOSIS — H401132 Primary open-angle glaucoma, bilateral, moderate stage: Secondary | ICD-10-CM | POA: Diagnosis not present

## 2016-07-03 DIAGNOSIS — Z1211 Encounter for screening for malignant neoplasm of colon: Secondary | ICD-10-CM | POA: Diagnosis not present

## 2016-09-08 ENCOUNTER — Other Ambulatory Visit: Payer: Self-pay | Admitting: Hematology and Oncology

## 2016-09-08 DIAGNOSIS — F200 Paranoid schizophrenia: Secondary | ICD-10-CM | POA: Diagnosis not present

## 2016-09-21 DIAGNOSIS — R3915 Urgency of urination: Secondary | ICD-10-CM | POA: Diagnosis not present

## 2016-09-21 DIAGNOSIS — N3090 Cystitis, unspecified without hematuria: Secondary | ICD-10-CM | POA: Diagnosis not present

## 2016-09-21 DIAGNOSIS — E119 Type 2 diabetes mellitus without complications: Secondary | ICD-10-CM | POA: Diagnosis not present

## 2016-09-21 DIAGNOSIS — R946 Abnormal results of thyroid function studies: Secondary | ICD-10-CM | POA: Diagnosis not present

## 2016-09-21 DIAGNOSIS — E785 Hyperlipidemia, unspecified: Secondary | ICD-10-CM | POA: Diagnosis not present

## 2016-09-21 DIAGNOSIS — I10 Essential (primary) hypertension: Secondary | ICD-10-CM | POA: Diagnosis not present

## 2016-10-12 DIAGNOSIS — H401132 Primary open-angle glaucoma, bilateral, moderate stage: Secondary | ICD-10-CM | POA: Diagnosis not present

## 2016-12-08 DIAGNOSIS — Z853 Personal history of malignant neoplasm of breast: Secondary | ICD-10-CM | POA: Diagnosis not present

## 2016-12-08 DIAGNOSIS — Z1231 Encounter for screening mammogram for malignant neoplasm of breast: Secondary | ICD-10-CM | POA: Diagnosis not present

## 2016-12-22 ENCOUNTER — Encounter (INDEPENDENT_AMBULATORY_CARE_PROVIDER_SITE_OTHER): Payer: Self-pay

## 2016-12-22 ENCOUNTER — Ambulatory Visit (HOSPITAL_BASED_OUTPATIENT_CLINIC_OR_DEPARTMENT_OTHER): Payer: Medicare Other | Admitting: Hematology and Oncology

## 2016-12-22 ENCOUNTER — Encounter: Payer: Self-pay | Admitting: Hematology and Oncology

## 2016-12-22 VITALS — BP 174/67 | HR 60 | Temp 97.7°F | Resp 18 | Ht 64.0 in | Wt 155.9 lb

## 2016-12-22 DIAGNOSIS — M8580 Other specified disorders of bone density and structure, unspecified site: Secondary | ICD-10-CM | POA: Diagnosis not present

## 2016-12-22 DIAGNOSIS — C50012 Malignant neoplasm of nipple and areola, left female breast: Secondary | ICD-10-CM

## 2016-12-22 DIAGNOSIS — Z79810 Long term (current) use of selective estrogen receptor modulators (SERMs): Secondary | ICD-10-CM | POA: Diagnosis not present

## 2016-12-22 DIAGNOSIS — E559 Vitamin D deficiency, unspecified: Secondary | ICD-10-CM

## 2016-12-22 DIAGNOSIS — C50412 Malignant neoplasm of upper-outer quadrant of left female breast: Secondary | ICD-10-CM

## 2016-12-22 DIAGNOSIS — M810 Age-related osteoporosis without current pathological fracture: Secondary | ICD-10-CM

## 2016-12-22 DIAGNOSIS — Z170 Estrogen receptor positive status [ER+]: Secondary | ICD-10-CM

## 2016-12-22 MED ORDER — CHOLECALCIFEROL 50 MCG (2000 UT) PO CAPS: 1.0000 | ORAL_CAPSULE | Freq: Every day | ORAL | 3 refills | Status: DC

## 2016-12-22 MED ORDER — CALCIUM CARBONATE-VITAMIN D 500-200 MG-UNIT PO TABS: 1.0000 | ORAL_TABLET | Freq: Every day | ORAL | 3 refills | Status: DC

## 2016-12-22 MED ORDER — TAMOXIFEN CITRATE 20 MG PO TABS: 20.0000 mg | ORAL_TABLET | Freq: Every day | ORAL | 3 refills | Status: DC

## 2016-12-22 NOTE — Assessment & Plan Note (Signed)
Left breast cancer T2, N0, M0 stage II A. ER/PR positive HER-2 negative status post left mastectomy and currently on antiestrogen therapy with tamoxifen since 01/12/2010  Tamoxifen toxicities: 1. Initially a lot of musculoskeletal aches and pains subsided 2. Patient stays very active by participating in yoga and book clubs in [facility]  Breast cancer surveillance: 1. Mammogram August 2018: Benign, Breast density category C 2. Breast exam 12/22/2016  is normal 3. Bone density October 2014 showed a T score of -2.3 in the femur: Patient needs another bone density  Osteopenia: takes bisphosphonates along with calcium and vitamin D We discussed the role of extended adjuvant therapy. Patient had previously completed 5 years of therapy and requested that she remain on tamoxifen for the next 5 years as well.   Return to clinic in 1 year for follow-up.

## 2016-12-22 NOTE — Progress Notes (Signed)
Patient Care Team: Harlan Stains, MD as PCP - General (Family Medicine)  DIAGNOSIS:  Encounter Diagnoses  Name Primary?  . Malignant neoplasm of upper-outer quadrant of left breast in female, estrogen receptor positive ([place])   . Age-related osteoporosis without current pathological fracture Yes  . 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 Vitamin D deficiency     SUMMARY OF ONCOLOGIC HISTORY:   Breast cancer of upper-outer quadrant of left female breast ([place])   10/22/2009 Mammogram     2 masses in the anterior aspect of the left breast.  1.9 x 1.4 cm. and 1.6 x 1.2 cm. 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 Post-Op not felt to be a candidate for chemo or XRT       01/12/2010 -  Anti-estrogen oral therapy    Tamoxifen 20 mg daily       CHIEF COMPLIANT: follow-up on tamoxifen therapy  INTERVAL HISTORY: Alexandra Lamb is a 71 year old with above-mentioned history left breast cancer treated with mastectomy and is now on tamoxifen. She is tolerating tamoxifen extremely well. The plan is to keep her on tamoxifen for 10 years. Denies any hot flashes or myalgias.  REVIEW OF SYSTEMS:   Constitutional: Denies fevers, chills or abnormal weight loss Eyes: Denies blurriness of vision Ears, nose, mouth, throat, and face: Denies mucositis or sore throat Respiratory: Denies cough, dyspnea or wheezes Cardiovascular: Denies palpitation, chest discomfort Gastrointestinal:  Denies nausea, heartburn or change in bowel  habits Skin: Denies abnormal skin rashes Lymphatics: Denies new lymphadenopathy or easy bruising Neurological:Denies numbness, tingling or new weaknesses Behavioral/Psych: Mood is stable, no new changes  Extremities: No lower extremity edema Breast:  denies any pain or lumps or nodules in either breasts All other systems were reviewed with the patient and are negative.  I have reviewed the past medical history, past surgical history, social history and family history with the patient and they are unchanged from previous note.  ALLERGIES:  has No Known Allergies.  MEDICATIONS:  Current Outpatient Prescriptions  Medication Sig Dispense Refill  . acetaminophen (TYLENOL) 500 MG tablet Take 500 mg by mouth every 6 (six) hours as needed.      Marland Kitchen amLODipine (NORVASC) 5 MG tablet Take 5 mg by mouth daily.      Marland Kitchen atenolol (TENORMIN) 50 MG tablet Take 50 mg by mouth daily.      . calcium carbonate (TUMS - DOSED IN MG ELEMENTAL CALCIUM) 500 MG chewable tablet Chew 1 tablet by mouth daily.      . calcium-vitamin D (OSCAL WITH D) 500-200 MG-UNIT tablet Take 1 tablet by mouth daily. 90 tablet 3  . Cholecalciferol (RA VITAMIN D-3) 2000 units CAPS Take 1 capsule (2,000 Units total) by mouth daily. 90 capsule 3  . divalproex (DEPAKOTE) 500 MG DR tablet Take 1,500 mg by mouth at bedtime.     Marland Kitchen losartan (COZAAR) 100 MG tablet Take 100 mg by mouth daily.      Marland Kitchen  Multiple Vitamins-Minerals (MULTIVITAMIN WITH MINERALS) tablet Take 1 tablet by mouth daily.      Marland Kitchen OLANZapine (ZYPREXA) 5 MG tablet Take 5 mg by mouth at bedtime. One in the am and three at bedtime.    . pioglitazone (ACTOS) 30 MG tablet Take 30 mg by mouth daily.      Marland Kitchen RA COL-RITE 100 MG capsule   0  . simvastatin (ZOCOR) 10 MG tablet Take 10 mg by mouth daily.    . tamoxifen (NOLVADEX) 20 MG tablet Take 1 tablet (20 mg total) by mouth daily. 90 tablet 3   No current facility-administered medications for this visit.     PHYSICAL  EXAMINATION: ECOG PERFORMANCE STATUS: 1 - Symptomatic but completely ambulatory  Vitals:   12/22/16 1117  BP: (!) 174/67  Pulse: 60  Resp: 18  Temp: 97.7 F (36.5 C)  SpO2: 100%   Filed Weights   12/22/16 1117  Weight: 155 lb 14.4 oz (70.7 kg)    GENERAL:alert, no distress and comfortable SKIN: skin color, texture, turgor are normal, no rashes or significant lesions EYES: normal, Conjunctiva are pink and non-injected, sclera clear OROPHARYNX:no exudate, no erythema and lips, buccal mucosa, and tongue normal  NECK: supple, thyroid normal size, non-tender, without nodularity LYMPH:  no palpable lymphadenopathy in the cervical, axillary or inguinal LUNGS: clear to auscultation and percussion with normal breathing effort HEART: regular rate & rhythm and no murmurs and no lower extremity edema ABDOMEN:abdomen soft, non-tender and normal bowel sounds MUSCULOSKELETAL:no cyanosis of digits and no clubbing  NEURO: alert & oriented x 3 with fluent speech, no focal motor/sensory deficits EXTREMITIES: No lower extremity edema  LABORATORY DATA:  I have reviewed the data as listed   Chemistry      Component Value Date/Time   NA 139 06/19/2014 1041   K 4.6 06/19/2014 1041   CL 104 10/06/2011 1127   CO2 25 06/19/2014 1041   BUN 23.5 06/19/2014 1041   CREATININE 0.9 06/19/2014 1041      Component Value Date/Time   CALCIUM 9.6 06/19/2014 1041   ALKPHOS 56 06/19/2014 1041   AST 24 06/19/2014 1041   ALT 12 06/19/2014 1041   BILITOT 0.39 06/19/2014 1041       Lab Results  Component Value Date   WBC 5.2 06/19/2014   HGB 12.5 06/19/2014   HCT 37.3 06/19/2014   MCV 89.7 06/19/2014   PLT 143 (L) 06/19/2014   NEUTROABS 3.6 06/19/2014    ASSESSMENT & PLAN:  Breast cancer of upper-outer quadrant of left female breast (HCC) Left breast cancer T2, N0, M0 stage II A. ER/PR positive HER-2 negative status post left mastectomy and currently on antiestrogen therapy with tamoxifen since  01/12/2010  Tamoxifen toxicities: 1. Initially a lot of musculoskeletal aches and pains subsided 2. Patient stays very active by participating in yoga at [facility] and at the senior center she participates in craft making  Breast cancer surveillance: 1. Mammogram August 2018: Benign, Breast density category C 2. Breast exam 12/22/2016  is normal 3. Bone density October 2014 showed a T score of -2.3 in the femur: Patient had another bone density at [facility]. Next BD in 11/2017  Osteopenia: takes bisphosphonates along with calcium and vitamin D We discussed the role of extended adjuvant therapy. Patient had previously completed 5 years of therapy and requested that she remain on tamoxifen for the next 5 years as well.   Return to clinic in 1 year for follow-up.   I spent 25  minutes talking to the patient of which more than half was spent in counseling and coordination of care.  Orders Placed This Encounter  Procedures  . DG Bone Density    Standing Status:   Future    Standing Expiration Date:   12/22/2017    Order Specific Question:   Reason for Exam (SYMPTOM  OR DIAGNOSIS REQUIRED)    Answer:   osteoporosis evaluation    Order Specific Question:   Preferred imaging location?    Answer:   External    Comments:   [facility]   The patient has a good understanding of the overall plan. she agrees with it. she will call with any problems that may develop before the next visit here.   Rulon Eisenmenger, MD 12/22/16

## 2017-01-08 DIAGNOSIS — R600 Localized edema: Secondary | ICD-10-CM | POA: Diagnosis not present

## 2017-01-08 DIAGNOSIS — Z23 Encounter for immunization: Secondary | ICD-10-CM | POA: Diagnosis not present

## 2017-02-09 DIAGNOSIS — H401131 Primary open-angle glaucoma, bilateral, mild stage: Secondary | ICD-10-CM | POA: Diagnosis not present

## 2017-02-09 DIAGNOSIS — H524 Presbyopia: Secondary | ICD-10-CM | POA: Diagnosis not present

## 2017-03-04 DIAGNOSIS — N3000 Acute cystitis without hematuria: Secondary | ICD-10-CM | POA: Diagnosis not present

## 2017-03-04 DIAGNOSIS — R350 Frequency of micturition: Secondary | ICD-10-CM | POA: Diagnosis not present

## 2017-03-09 DIAGNOSIS — F200 Paranoid schizophrenia: Secondary | ICD-10-CM | POA: Diagnosis not present

## 2017-05-25 DIAGNOSIS — C50912 Malignant neoplasm of unspecified site of left female breast: Secondary | ICD-10-CM | POA: Diagnosis not present

## 2017-05-25 DIAGNOSIS — Z136 Encounter for screening for cardiovascular disorders: Secondary | ICD-10-CM | POA: Diagnosis not present

## 2017-05-25 DIAGNOSIS — F209 Schizophrenia, unspecified: Secondary | ICD-10-CM | POA: Diagnosis not present

## 2017-05-25 DIAGNOSIS — E119 Type 2 diabetes mellitus without complications: Secondary | ICD-10-CM | POA: Diagnosis not present

## 2017-05-25 DIAGNOSIS — Z111 Encounter for screening for respiratory tuberculosis: Secondary | ICD-10-CM | POA: Diagnosis not present

## 2017-05-25 DIAGNOSIS — D1721 Benign lipomatous neoplasm of skin and subcutaneous tissue of right arm: Secondary | ICD-10-CM | POA: Diagnosis not present

## 2017-05-25 DIAGNOSIS — E785 Hyperlipidemia, unspecified: Secondary | ICD-10-CM | POA: Diagnosis not present

## 2017-05-25 DIAGNOSIS — Z170 Estrogen receptor positive status [ER+]: Secondary | ICD-10-CM | POA: Diagnosis not present

## 2017-05-25 DIAGNOSIS — Z1211 Encounter for screening for malignant neoplasm of colon: Secondary | ICD-10-CM | POA: Diagnosis not present

## 2017-05-25 DIAGNOSIS — Z0000 Encounter for general adult medical examination without abnormal findings: Secondary | ICD-10-CM | POA: Diagnosis not present

## 2017-05-25 DIAGNOSIS — I10 Essential (primary) hypertension: Secondary | ICD-10-CM | POA: Diagnosis not present

## 2017-05-27 DIAGNOSIS — H401132 Primary open-angle glaucoma, bilateral, moderate stage: Secondary | ICD-10-CM | POA: Diagnosis not present

## 2017-09-24 DIAGNOSIS — H401131 Primary open-angle glaucoma, bilateral, mild stage: Secondary | ICD-10-CM | POA: Diagnosis not present

## 2017-10-13 DIAGNOSIS — F200 Paranoid schizophrenia: Secondary | ICD-10-CM | POA: Diagnosis not present

## 2017-11-26 DIAGNOSIS — E785 Hyperlipidemia, unspecified: Secondary | ICD-10-CM | POA: Diagnosis not present

## 2017-11-26 DIAGNOSIS — I10 Essential (primary) hypertension: Secondary | ICD-10-CM | POA: Diagnosis not present

## 2017-11-26 DIAGNOSIS — E1169 Type 2 diabetes mellitus with other specified complication: Secondary | ICD-10-CM | POA: Diagnosis not present

## 2017-12-10 DIAGNOSIS — Z23 Encounter for immunization: Secondary | ICD-10-CM | POA: Diagnosis not present

## 2017-12-21 ENCOUNTER — Inpatient Hospital Stay: Payer: Medicare Other | Attending: Hematology and Oncology | Admitting: Hematology and Oncology

## 2017-12-21 ENCOUNTER — Telehealth: Payer: Self-pay | Admitting: Hematology and Oncology

## 2017-12-21 DIAGNOSIS — Z79810 Long term (current) use of selective estrogen receptor modulators (SERMs): Secondary | ICD-10-CM

## 2017-12-21 DIAGNOSIS — M8580 Other specified disorders of bone density and structure, unspecified site: Secondary | ICD-10-CM

## 2017-12-21 DIAGNOSIS — Z170 Estrogen receptor positive status [ER+]: Secondary | ICD-10-CM | POA: Diagnosis not present

## 2017-12-21 DIAGNOSIS — C50412 Malignant neoplasm of upper-outer quadrant of left female breast: Secondary | ICD-10-CM | POA: Diagnosis not present

## 2017-12-21 DIAGNOSIS — E559 Vitamin D deficiency, unspecified: Secondary | ICD-10-CM | POA: Diagnosis not present

## 2017-12-21 MED ORDER — CHOLECALCIFEROL 50 MCG (2000 UT) PO CAPS: 1.0000 | ORAL_CAPSULE | Freq: Every day | ORAL | 3 refills | Status: AC

## 2017-12-21 MED ORDER — TAMOXIFEN CITRATE 20 MG PO TABS: 20.0000 mg | ORAL_TABLET | Freq: Every day | ORAL | 3 refills | Status: DC

## 2017-12-21 NOTE — Telephone Encounter (Signed)
Gave pt avs and calendar  °

## 2017-12-21 NOTE — Assessment & Plan Note (Signed)
Left breast cancer T2, N0, M0 stage II A. ER/PR positive HER-2 negative status post left mastectomy and currently on antiestrogen therapy with tamoxifen since 01/12/2010 (plan is to treat for 10 years)  Tamoxifen toxicities: 1. Initially a lot of musculoskeletal aches and pains subsided 2. Patient stays very active by participating in yoga at [facility] and at the senior center she participates in craft making  Breast cancer surveillance: 1. Mammogram August 2018: Benign, Breast density category C 2. Breast exam  12/21/2017 is normal 3. Bone density October 2014 showed a T score of -2.3 in the femur: Patient had another bone density at [facility]. Next BD in 11/2017  Osteopenia: takes bisphosphonates along with calcium and vitamin D  Return to clinic 1 year for follow-up

## 2017-12-21 NOTE — Progress Notes (Signed)
Patient Care Team: Harlan Stains, MD as PCP - General (Family Medicine)  DIAGNOSIS:  Encounter Diagnoses  Name Primary?  . Malignant neoplasm of upper-outer quadrant of left breast in female, estrogen receptor positive ([place])   . Vitamin D deficiency     SUMMARY OF ONCOLOGIC HISTORY:   Breast cancer of upper-outer quadrant of left female breast ([place])   10/22/2009 Mammogram     2 masses in the anterior aspect of the left breast.  1.9 x 1.4 cm. and 1.6 x 1.2 cm. 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 There was also noted to be a separate area of DCIS    12/09/2009 Surgery    left total mastectomy with sentinel lymph node biopsy. 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 Post-Op not felt to be a candidate for chemo or XRT     01/12/2010 -  Anti-estrogen oral therapy    Tamoxifen 20 mg daily     CHIEF COMPLIANT: Follow-up on tamoxifen therapy  INTERVAL HISTORY: Alexandra Lamb is a 72 year old with above-mentioned history of left breast cancer underwent mastectomy and is been on tamoxifen since 2011.  She appears to be tolerating it fairly well.  She does not have any side effects or concerns.  Denies any hot flashes or arthralgias.  REVIEW OF SYSTEMS:   Constitutional: Denies fevers, chills or abnormal weight loss Eyes: Denies blurriness of vision Ears, nose, mouth, throat, and face: Denies mucositis or sore throat Respiratory: Denies cough, dyspnea or wheezes Cardiovascular: Denies palpitation, chest discomfort Gastrointestinal:  Denies nausea, heartburn or change in bowel habits Skin: Denies abnormal skin rashes Lymphatics: Denies new lymphadenopathy or easy bruising Neurological:Denies numbness, tingling or new  weaknesses Behavioral/Psych: Mood is stable, no new changes  Extremities: No lower extremity edema Breast:  denies any pain or lumps or nodules in either breasts All other systems were reviewed with the patient and are negative.  I have reviewed the past medical history, past surgical history, social history and family history with the patient and they are unchanged from previous note.  ALLERGIES:  has No Known Allergies.  MEDICATIONS:  Current Outpatient Medications  Medication Sig Dispense Refill  . acetaminophen (TYLENOL) 500 MG tablet Take 500 mg by mouth every 6 (six) hours as needed.      Marland Kitchen amLODipine (NORVASC) 5 MG tablet Take 5 mg by mouth daily.      Marland Kitchen atenolol (TENORMIN) 50 MG tablet Take 50 mg by mouth daily.      . calcium carbonate (TUMS - DOSED IN MG ELEMENTAL CALCIUM) 500 MG chewable tablet Chew 1 tablet by mouth daily.      . calcium-vitamin D (OSCAL WITH D) 500-200 MG-UNIT tablet Take 1 tablet by mouth daily. 90 tablet 3  . Cholecalciferol (RA VITAMIN D-3) 2000 units CAPS Take 1 capsule (2,000 Units total) by mouth daily. 90 capsule 3  . divalproex (DEPAKOTE) 500 MG DR tablet Take 1,500 mg by mouth at bedtime.     Marland Kitchen losartan (COZAAR) 100 MG tablet Take 100 mg by mouth daily.      . Multiple Vitamins-Minerals (MULTIVITAMIN WITH MINERALS) tablet Take 1 tablet by mouth daily.      Marland Kitchen OLANZapine (ZYPREXA) 5 MG tablet Take  5 mg by mouth at bedtime. One in the am and three at bedtime.    . pioglitazone (ACTOS) 30 MG tablet Take 30 mg by mouth daily.      Marland Kitchen RA COL-RITE 100 MG capsule   0  . simvastatin (ZOCOR) 10 MG tablet Take 10 mg by mouth daily.    . tamoxifen (NOLVADEX) 20 MG tablet Take 1 tablet (20 mg total) by mouth daily. 90 tablet 3   No current facility-administered medications for this visit.     PHYSICAL EXAMINATION: ECOG PERFORMANCE STATUS: 1 - Symptomatic but completely ambulatory  Vitals:   12/21/17 1035  BP: (!) 160/65  Pulse: (!) 54  Resp: 18  Temp:  (!) 97.5 F (36.4 C)  SpO2: 98%   Filed Weights   12/21/17 1035  Weight: 151 lb 8 oz (68.7 kg)    GENERAL:alert, no distress and comfortable SKIN: skin color, texture, turgor are normal, no rashes or significant lesions EYES: normal, Conjunctiva are pink and non-injected, sclera clear OROPHARYNX:no exudate, no erythema and lips, buccal mucosa, and tongue normal  NECK: supple, thyroid normal size, non-tender, without nodularity LYMPH:  no palpable lymphadenopathy in the cervical, axillary or inguinal LUNGS: clear to auscultation and percussion with normal breathing effort HEART: regular rate & rhythm and no murmurs and no lower extremity edema ABDOMEN:abdomen soft, non-tender and normal bowel sounds MUSCULOSKELETAL:no cyanosis of digits and no clubbing  NEURO: alert & oriented x 3 with fluent speech, no focal motor/sensory deficits EXTREMITIES: No lower extremity edema BREAST: No palpable masses or nodules in either right or left breasts. No palpable axillary supraclavicular or infraclavicular adenopathy no breast tenderness or nipple discharge. (exam performed in the presence of a chaperone)  LABORATORY DATA:  I have reviewed the data as listed CMP Latest Ref Rng & Units 06/19/2014 12/13/2013 06/13/2013  Glucose 70 - 140 mg/dl 183(H) 182(H) 188(H)  BUN 7.0 - 26.0 mg/dL 23.5 22.5 21.7  Creatinine 0.6 - 1.1 mg/dL 0.9 1.0 0.8  Sodium 136 - 145 mEq/L 139 139 139  Potassium 3.5 - 5.1 mEq/L 4.6 4.9 4.8  Chloride 96 - 112 mEq/L - - -  CO2 22 - 29 mEq/L 25 30(H) 25  Calcium 8.4 - 10.4 mg/dL 9.6 9.5 9.6  Total Protein 6.4 - 8.3 g/dL 6.1(L) 6.4 6.1(L)  Total Bilirubin 0.20 - 1.20 mg/dL 0.39 0.35 0.24  Alkaline Phos 40 - 150 U/L 56 55 45  AST 5 - 34 U/L 24 22 18   ALT 0 - 55 U/L 12 16 14     Lab Results  Component Value Date   WBC 5.2 06/19/2014   HGB 12.5 06/19/2014   HCT 37.3 06/19/2014   MCV 89.7 06/19/2014   PLT 143 (L) 06/19/2014   NEUTROABS 3.6 06/19/2014    ASSESSMENT & PLAN:   Breast cancer of upper-outer quadrant of left female breast (HCC) Left breast cancer T2, N0, M0 stage II A. ER/PR positive HER-2 negative status post left mastectomy and currently on antiestrogen therapy with tamoxifen since 01/12/2010 (plan is to treat for 10 years)  Tamoxifen toxicities: 1. Initially a lot of musculoskeletal aches and pains subsided 2. Patient stays very active by participating in yoga at [facility] and at the senior center she participates in craft making  Breast cancer surveillance: 1. Mammogram August 2018: Benign, Breast density category C 2. Breast exam  12/21/2017 is normal 3. Bone density October 2014 showed a T score of -2.3 in the femur: Patient had another bone density at [facility].  Next BD in 11/2017  Osteopenia: takes bisphosphonates along with calcium and vitamin D  Return to clinic 1 year for follow-up    No orders of the defined types were placed in this encounter.  The patient has a good understanding of the overall plan. she agrees with it. she will call with any problems that may develop before the next visit here.   Harriette Ohara, MD 12/21/17

## 2017-12-28 DIAGNOSIS — Z853 Personal history of malignant neoplasm of breast: Secondary | ICD-10-CM | POA: Diagnosis not present

## 2017-12-28 DIAGNOSIS — Z1231 Encounter for screening mammogram for malignant neoplasm of breast: Secondary | ICD-10-CM | POA: Diagnosis not present

## 2018-02-07 DIAGNOSIS — H401131 Primary open-angle glaucoma, bilateral, mild stage: Secondary | ICD-10-CM | POA: Diagnosis not present

## 2018-02-11 DIAGNOSIS — J069 Acute upper respiratory infection, unspecified: Secondary | ICD-10-CM | POA: Diagnosis not present

## 2018-06-07 DIAGNOSIS — H524 Presbyopia: Secondary | ICD-10-CM | POA: Diagnosis not present

## 2018-06-07 DIAGNOSIS — H401131 Primary open-angle glaucoma, bilateral, mild stage: Secondary | ICD-10-CM | POA: Diagnosis not present

## 2018-07-20 DIAGNOSIS — F209 Schizophrenia, unspecified: Secondary | ICD-10-CM | POA: Diagnosis not present

## 2018-07-20 DIAGNOSIS — Z0000 Encounter for general adult medical examination without abnormal findings: Secondary | ICD-10-CM | POA: Diagnosis not present

## 2018-07-20 DIAGNOSIS — I10 Essential (primary) hypertension: Secondary | ICD-10-CM | POA: Diagnosis not present

## 2018-07-20 DIAGNOSIS — E1169 Type 2 diabetes mellitus with other specified complication: Secondary | ICD-10-CM | POA: Diagnosis not present

## 2018-07-20 DIAGNOSIS — E785 Hyperlipidemia, unspecified: Secondary | ICD-10-CM | POA: Diagnosis not present

## 2018-07-20 DIAGNOSIS — C50912 Malignant neoplasm of unspecified site of left female breast: Secondary | ICD-10-CM | POA: Diagnosis not present

## 2018-07-20 DIAGNOSIS — Z170 Estrogen receptor positive status [ER+]: Secondary | ICD-10-CM | POA: Diagnosis not present

## 2018-07-20 DIAGNOSIS — F200 Paranoid schizophrenia: Secondary | ICD-10-CM | POA: Diagnosis not present

## 2018-12-05 DIAGNOSIS — R946 Abnormal results of thyroid function studies: Secondary | ICD-10-CM | POA: Diagnosis not present

## 2018-12-05 DIAGNOSIS — E1169 Type 2 diabetes mellitus with other specified complication: Secondary | ICD-10-CM | POA: Diagnosis not present

## 2018-12-05 DIAGNOSIS — E785 Hyperlipidemia, unspecified: Secondary | ICD-10-CM | POA: Diagnosis not present

## 2018-12-20 ENCOUNTER — Ambulatory Visit: Payer: Self-pay | Admitting: Hematology and Oncology

## 2019-01-02 DIAGNOSIS — H401132 Primary open-angle glaucoma, bilateral, moderate stage: Secondary | ICD-10-CM | POA: Diagnosis not present

## 2019-01-23 DIAGNOSIS — E1169 Type 2 diabetes mellitus with other specified complication: Secondary | ICD-10-CM | POA: Diagnosis not present

## 2019-01-23 DIAGNOSIS — K5900 Constipation, unspecified: Secondary | ICD-10-CM | POA: Diagnosis not present

## 2019-01-23 DIAGNOSIS — R5381 Other malaise: Secondary | ICD-10-CM | POA: Diagnosis not present

## 2019-01-23 DIAGNOSIS — Z0000 Encounter for general adult medical examination without abnormal findings: Secondary | ICD-10-CM | POA: Diagnosis not present

## 2019-01-23 DIAGNOSIS — E785 Hyperlipidemia, unspecified: Secondary | ICD-10-CM | POA: Diagnosis not present

## 2019-01-23 DIAGNOSIS — R946 Abnormal results of thyroid function studies: Secondary | ICD-10-CM | POA: Diagnosis not present

## 2019-01-23 DIAGNOSIS — I10 Essential (primary) hypertension: Secondary | ICD-10-CM | POA: Diagnosis not present

## 2019-01-24 DIAGNOSIS — Z853 Personal history of malignant neoplasm of breast: Secondary | ICD-10-CM | POA: Diagnosis not present

## 2019-01-24 DIAGNOSIS — Z1231 Encounter for screening mammogram for malignant neoplasm of breast: Secondary | ICD-10-CM | POA: Diagnosis not present

## 2019-01-30 NOTE — Progress Notes (Signed)
Patient Care Team: Harlan Stains, MD as PCP - General (Family Medicine)  DIAGNOSIS:    ICD-10-CM   1. Malignant neoplasm of upper-outer quadrant of left breast in female, estrogen receptor positive ([place])  C50.412    Z17.0     SUMMARY OF ONCOLOGIC HISTORY: Oncology History  Breast cancer of upper-outer quadrant of left female breast ([place])  10/22/2009 Mammogram    2 masses in the anterior aspect of the left breast.  1.9 x 1.4 cm. and 1.6 x 1.2 cm. 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 Post-Op not felt to be a candidate for chemo or XRT    01/12/2010 -  Anti-estrogen oral therapy   Tamoxifen 20 mg daily     CHIEF COMPLIANT: Follow-up of left breast cancer on tamoxifen therapy  INTERVAL HISTORY: Alexandra Lamb is a 73 y.o. with above-mentioned history of left breast cancer who underwent a mastectomy and is currently on anti-estrogen therapy with tamoxifen. I last saw her a year ago. She presents to the clinic today for annual follow-up.  She reports no problems taking tamoxifen.  Denies any lumps or nodules in the breast.  Recent mammograms were normal.  This was done at [facility].  She has moderate hearing impairment and requires assistance with ADLs but she is able to walk by herself.  REVIEW OF SYSTEMS:   Constitutional: Denies fevers, chills or abnormal weight loss Eyes: Denies blurriness of vision Ears, nose, mouth, throat, and face: Denies mucositis or sore throat Respiratory: Denies cough, dyspnea or wheezes Cardiovascular: Denies palpitation, chest discomfort  Gastrointestinal: Denies nausea, heartburn or change in bowel habits Skin: Denies abnormal skin rashes Lymphatics: Denies new lymphadenopathy or easy bruising Neurological: Denies numbness, tingling or new weaknesses Behavioral/Psych: Mood is stable, no new changes  Extremities: No lower extremity edema Breast: denies any pain or lumps or nodules in either breasts All other systems were reviewed with the patient and are negative.  I have reviewed the past medical history, past surgical history, social history and family history with the patient and they are unchanged from previous note.  ALLERGIES:  has No Known Allergies.  MEDICATIONS:  Current Outpatient Medications  Medication Sig Dispense Refill  . acetaminophen (TYLENOL) 500 MG tablet Take 500 mg by mouth every 6 (six) hours as needed.      Marland Kitchen amLODipine (NORVASC) 5 MG tablet Take 5 mg by mouth daily.      Marland Kitchen atenolol (TENORMIN) 50 MG tablet Take 25 mg by mouth daily.     . calcium carbonate (TUMS - DOSED IN MG ELEMENTAL CALCIUM) 500 MG chewable tablet Chew 1 tablet by mouth as needed.     . calcium-vitamin D (OSCAL WITH D) 500-200 MG-UNIT tablet Take 1 tablet by mouth daily. 90 tablet 3  . Cholecalciferol (RA VITAMIN D-3) 2000 units CAPS Take 1 capsule (2,000 Units total) by mouth daily. 90 capsule 3  . divalproex (DEPAKOTE) 500 MG DR tablet Take 1,500 mg by mouth at bedtime.     Marland Kitchen latanoprost (XALATAN) 0.005 % ophthalmic solution Place  1 drop into both eyes at bedtime.    Marland Kitchen losartan (COZAAR) 100 MG tablet Take 100 mg by mouth daily.      . Multiple Vitamins-Minerals (MULTIVITAMIN WITH MINERALS) tablet Take 1 tablet by mouth daily.      Marland Kitchen OLANZapine (ZYPREXA) 5 MG tablet Take 5 mg by mouth at bedtime. One in the am and three at bedtime.    . pioglitazone (ACTOS) 30 MG tablet Take 15 mg by mouth daily.     Marland Kitchen RA COL-RITE 100 MG capsule Take 100 mg by mouth daily.   0  . simvastatin (ZOCOR) 10 MG tablet Take 10 mg by mouth daily.    .  tamoxifen (NOLVADEX) 20 MG tablet Take 1 tablet (20 mg total) by mouth daily. 90 tablet 3   No current facility-administered medications for this visit.     PHYSICAL EXAMINATION: ECOG PERFORMANCE STATUS: 1 - Symptomatic but completely ambulatory  Vitals:   01/31/19 0901  BP: (!) 159/64  Pulse: 68  Resp: 17  Temp: 97.8 F (36.6 C)  SpO2: 98%   Filed Weights   01/31/19 0901  Weight: 151 lb 9.6 oz (68.8 kg)    GENERAL: alert, no distress and comfortable SKIN: skin color, texture, turgor are normal, no rashes or significant lesions EYES: normal, Conjunctiva are pink and non-injected, sclera clear OROPHARYNX: no exudate, no erythema and lips, buccal mucosa, and tongue normal  NECK: supple, thyroid normal size, non-tender, without nodularity LYMPH: no palpable lymphadenopathy in the cervical, axillary or inguinal LUNGS: clear to auscultation and percussion with normal breathing effort HEART: regular rate & rhythm and no murmurs and no lower extremity edema ABDOMEN: abdomen soft, non-tender and normal bowel sounds MUSCULOSKELETAL: no cyanosis of digits and no clubbing  NEURO: alert & oriented x 3 with fluent speech, no focal motor/sensory deficits EXTREMITIES: No lower extremity edema BREAST: No palpable masses or nodules in either right or left breasts. No palpable axillary supraclavicular or infraclavicular adenopathy no breast tenderness or nipple discharge. (exam performed in the presence of a chaperone)  LABORATORY DATA:  I have reviewed the data as listed CMP Latest Ref Rng & Units 06/19/2014 12/13/2013 06/13/2013  Glucose 70 - 140 mg/dl 183(H) 182(H) 188(H)  BUN 7.0 - 26.0 mg/dL 23.5 22.5 21.7  Creatinine 0.6 - 1.1 mg/dL 0.9 1.0 0.8  Sodium 136 - 145 mEq/L 139 139 139  Potassium 3.5 - 5.1 mEq/L 4.6 4.9 4.8  Chloride 96 - 112 mEq/L - - -  CO2 22 - 29 mEq/L 25 30(H) 25  Calcium 8.4 - 10.4 mg/dL 9.6 9.5 9.6  Total Protein 6.4 - 8.3 g/dL 6.1(L) 6.4 6.1(L)  Total Bilirubin 0.20  - 1.20 mg/dL 0.39 0.35 0.24  Alkaline Phos 40 - 150 U/L 56 55 45  AST 5 - 34 U/L _0 ALT 0 - 55 U/L _1 Lab Results  Component Value Date   WBC 5.2 06/19/2014   HGB 12.5 06/19/2014   HCT 37.3 06/19/2014   MCV 89.7 06/19/2014   PLT 143 (L) 06/19/2014   NEUTROABS 3.6 06/19/2014    ASSESSMENT & PLAN:  Breast cancer of upper-outer quadrant of left female breast (HCC) Left breast cancer T2, N0, M0 stage II A. ER/PR positive HER-2 negative status post left mastectomy and currently on antiestrogen therapy with tamoxifen since 01/12/2010 (plan is to treat for 10 years)  Tamoxifen toxicities: 1. Initially a lot of musculoskeletal aches and pains subsided 2. Patient stays very  [facility] and at [facility] center she participates in craft making  Breast cancer surveillance: 1. Mammogram10/09/2018: Benign, Breast density category C 2. Breast exam10/13/2020is normal 3. Bone density 8/9/2016showed a T score of -2.2  Osteopenia: takes bisphosphonates along with calcium and vitamin D  Return to clinic 1 year for follow-up and she will finish 10 years of tamoxifen therapy at that time.    No orders of the defined types were placed in this encounter.  The patient has a good understanding of the overall plan. she agrees with it. she will call with any problems that may develop before the next visit here.  Nicholas Lose, MD 01/31/2019  Julious Oka Dorshimer am acting as scribe for Dr. Nicholas Lose.  I have reviewed the above documentation for accuracy and completeness, and I agree with the above.

## 2019-01-31 ENCOUNTER — Inpatient Hospital Stay: Payer: Medicare Other | Attending: Hematology and Oncology | Admitting: Hematology and Oncology

## 2019-01-31 ENCOUNTER — Other Ambulatory Visit: Payer: Self-pay

## 2019-01-31 DIAGNOSIS — C50412 Malignant neoplasm of upper-outer quadrant of left female breast: Secondary | ICD-10-CM | POA: Diagnosis not present

## 2019-01-31 DIAGNOSIS — M8580 Other specified disorders of bone density and structure, unspecified site: Secondary | ICD-10-CM | POA: Insufficient documentation

## 2019-01-31 DIAGNOSIS — Z79810 Long term (current) use of selective estrogen receptor modulators (SERMs): Secondary | ICD-10-CM | POA: Insufficient documentation

## 2019-01-31 DIAGNOSIS — Z170 Estrogen receptor positive status [ER+]: Secondary | ICD-10-CM | POA: Diagnosis not present

## 2019-01-31 MED ORDER — TAMOXIFEN CITRATE 20 MG PO TABS: 20.0000 mg | ORAL_TABLET | Freq: Every day | ORAL | 3 refills | Status: DC

## 2019-01-31 NOTE — Assessment & Plan Note (Signed)
Left breast cancer T2, N0, M0 stage II A. ER/PR positive HER-2 negative status post left mastectomy and currently on antiestrogen therapy with tamoxifen since 01/12/2010 (plan is to treat for 10 years)  Tamoxifen toxicities: 1. Initially a lot of musculoskeletal aches and pains subsided 2. Patient stays very active by [facility] in [facility] and at the senior center she participates in craft making  Breast cancer surveillance: 1. MammogramAugust 2018: Benign, Breast density category C 2. Breast exam10/13/2020is normal 3. Bone density October 2014showed a T score of -2.3 in the femur: Patient hadanother bone densityat [facility]. Next BD in 11/2017  Osteopenia: takes bisphosphonates along with calcium and vitamin D  Return to clinic 1 year for follow-up

## 2019-02-01 ENCOUNTER — Telehealth: Payer: Self-pay | Admitting: Hematology and Oncology

## 2019-02-01 NOTE — Telephone Encounter (Signed)
No los per 10/13 °

## 2019-02-07 DIAGNOSIS — F200 Paranoid schizophrenia: Secondary | ICD-10-CM | POA: Diagnosis not present

## 2019-02-25 DIAGNOSIS — Z23 Encounter for immunization: Secondary | ICD-10-CM | POA: Diagnosis not present

## 2019-03-08 DIAGNOSIS — F200 Paranoid schizophrenia: Secondary | ICD-10-CM | POA: Diagnosis not present

## 2019-03-14 ENCOUNTER — Other Ambulatory Visit: Payer: Self-pay

## 2019-03-14 ENCOUNTER — Ambulatory Visit: Payer: Medicare Other | Attending: Family Medicine | Admitting: Physical Therapy

## 2019-03-14 DIAGNOSIS — R296 Repeated falls: Secondary | ICD-10-CM | POA: Diagnosis not present

## 2019-03-14 DIAGNOSIS — R2689 Other abnormalities of gait and mobility: Secondary | ICD-10-CM

## 2019-03-14 NOTE — Therapy (Signed)
[place], [place], [address] Phone: [phone]   Fax:  [phone]  Physical Therapy Evaluation  Patient Details  Name: Alexandra Lamb MRN: [id number] Date of Birth: 03-16-1946 Referring Provider (PT): Dr Harlan Stains    Encounter Date: 03/14/2019  PT End of Session - 03/14/19 1114    Visit Number  1    Number of Visits  12    Date for PT Re-Evaluation  04/25/19    Authorization Type  MCR / BCBS    PT Start Time  1100    PT Stop Time  1144    PT Time Calculation (min)  44 min    Activity Tolerance  Patient tolerated treatment well    Behavior During Therapy  [facility] for tasks assessed/performed       Past Medical History:  Diagnosis Date  . Cancer ([place])    left breast  . Diabetes mellitus   . Hyperlipidemia   . Hypertension   . Schizophrenia [facility])     Past Surgical History:  Procedure Laterality Date  . BREAST SURGERY  2011   left breast mast  . MASTECTOMY     left  . TOOTH EXTRACTION      There were no vitals filed for this visit.   Subjective Assessment - 03/14/19 1107    Subjective  Patient reports a history of falls. She cathesher foot on her landing at times. She has also fallen over a chair. She feels like at times her legs get very weak and painful. She has pain in her bilateral shin areas at times.    Patient is accompained by:  --   Caregiver   Pertinent History  scizophrenia, Diabetes, Breast cancer 10+ years ago    Limitations  Standing    Currently in Pain?  No/denies         [facility] PT Assessment - 03/14/19 0001      Assessment   Medical Diagnosis  Decreased balance/ Malaise    Referring Provider (PT)  Dr Harlan Stains     Hand Dominance  Right      Precautions   Precautions  Fall      Balance Screen   Has the patient fallen in the past 6 months  Yes    How many times?  --   Has had more falls since March    Has the patient had a decrease in activity level  because of a fear of falling?   Yes    Is the patient reluctant to leave their home because of a fear of falling?   Yes      Home Environment   Living Environment  Group home    Additional Comments  1 flight of steps inside then a landing and another flight of steps.       Prior Function   Level of Independence  Independent      Cognition   Overall Cognitive Status  History of cognitive impairments - at baseline    Attention  Focused    Problem Solving  Impaired    Problem Solving Impairment  Functional basic    Executive Function  Initiating    Initiating  Impaired    Initiating Impairment  Functional basic      ROM / Strength   AROM / PROM / Strength  Strength      Strength   Strength Assessment Site  Hip;Knee    Right/Left Hip  Right;Left  Right Hip Flexion  4/5    Right Hip ABduction  4/5    Right Hip ADduction  4/5    Left Hip Flexion  4/5    Left Hip ABduction  4/5    Left Hip ADduction  4/5    Right/Left Knee  Right;Left    Right Knee Flexion  4/5    Right Knee Extension  4/5    Left Knee Flexion  4/5    Left Knee Extension  4/5      Palpation   Palpation comment  no tednerness to palpation       Ambulation/Gait   Gait Comments  side bends to the left with gait; lateral movement > to the left; decreased bilateral hip flexion; deceased hip flexion with toe taps on a step.       Standardized Balance Assessment   Standardized Balance Assessment  Berg Balance Test;Timed Up and Go Test      Berg Balance Test   Sit to Stand  Able to stand  independently using hands    Standing Unsupported  Able to stand 2 minutes with supervision    Sitting with Back Unsupported but Feet Supported on Floor or Stool  Able to sit safely and securely 2 minutes    Stand to Sit  Uses backs of legs against chair to control descent    Transfers  Able to transfer with verbal cueing and /or supervision    Standing Unsupported with Eyes Closed  Able to stand 10 seconds with supervision     Standing Unsupported with Feet Together  Able to place feet together independently but unable to hold for 30 seconds    From Standing, Reach Forward with Outstretched Arm  Can reach forward >12 cm safely (5")    From Standing Position, Pick up Object from Floor  Able to pick up shoe, needs supervision    From Standing Position, Turn to Look Behind Over each Shoulder  Looks behind one side only/other side shows less weight shift    Turn 360 Degrees  Able to turn 360 degrees safely but slowly    Standing Unsupported, Alternately Place Feet on Step/Stool  Able to complete >2 steps/needs minimal assist    Standing Unsupported, One Foot in [employer] balance while stepping or standing    Standing on One Leg  Unable to try or needs assist to prevent fall    Total Score  31      Timed Up and Go Test   Normal TUG (seconds)  18    TUG Comments  some comprehension issues 19, 18, 18                 Objective measurements completed on examination: See above findings.      [facility] Adult PT Treatment/Exercise - 03/14/19 0001      Knee/Hip Exercises: Seated   Long Arc Quad Limitations  x10 yellow band    reviewed there-ex with care giver    Heel Slides Limitations  hamstring curl x10 yellow band     Clamshell with TheraBand  Green   x10             PT Education - 03/14/19 1258    Education Details  HEP and symptom management    Person(s) Educated  Patient    Methods  Explanation;Demonstration;Tactile cues;Verbal cues    Comprehension  Verbalized understanding;Returned demonstration;Verbal cues required;Tactile cues required       PT Short Term Goals - 03/14/19  [place] #1   Title  Patient will reduce TUG time by 5 seconds    Time  4    Period  Weeks    Status  New    Target Date  04/11/19      PT SHORT TERM GOAL #2   Title  Patient will improve BERG balance score by 5 points    Time  4    Period  Weeks    Status  New    Target Date  04/11/19       PT SHORT TERM GOAL #3   Title  Patient will trial use of a cane and progress to a cane if safe    Time  4    Period  Weeks    Status  New    Target Date  04/11/19        PT Long Term Goals - 03/14/19 1315      PT LONG TERM GOAL #1   Title  Patient will not report falls for 4 weeks    Time  8    Period  Weeks    Status  New    Target Date  05/09/19      PT LONG TERM GOAL #2   Title  Patient will score a 46 on the BERG to put herself at a moderate risk    Time  8    Period  Weeks    Status  New    Target Date  05/09/19      PT LONG TERM GOAL #3   Title  Patient will reduce TUG score to 12 seconds to reduce fall risk    Time  8    Period  Weeks    Status  New    Target Date  05/09/19             Plan - 03/14/19 1254    Clinical Impression Statement  Patient is a 73 year old female who is a high fall risk and presents with deconditioning. She cored a 33 on the BerG balance scale and 19 seconds on the TUG bothih indicating a high risk of falls. She may benefit from use of a [facility] but she may require training 2nd to baseline cognative defcits. She loves at a group home and has to go up and down stairs. She was fatigued with BERG testing. She oauld benefit from skilled therapy to improve balance and endurance.    Personal Factors and Comorbidities  Behavior Pattern;Comorbidity 1;Comorbidity 2    Comorbidities  schizophrenia, frequnet falls,    Examination-Activity Limitations  Squat;Bend;Sit;Transfers;Stand    Examination-Participation Restrictions  Community Activity    Stability/Clinical Decision Making  Evolving/Moderate complexity    Clinical Decision Making  High    Rehab Potential  Good    PT Frequency  2x / week    PT Duration  8 weeks    PT Treatment/Interventions  ADLs/Self Care Home Management;Cryotherapy;Electrical Stimulation;Iontophoresis 4mg /ml Dexamethasone;Moist Heat;Ultrasound;DME Instruction;Gait training;Functional mobility training;Therapeutic  activities;Therapeutic exercise;Neuromuscular re-education;Patient/family education;Manual techniques;Passive range of motion;Taping    PT Next Visit Plan  begin balance training, consider using a cane but may need extensive training 2nd to cognative issues. Patient is very pleasent but needs frequnet cuing; ny type of LE strengthening woul dbe beneficial; progress balance exercises as tolerated but start with basic rhomberg exercies. Patient needs stair training a well.    PT Home Exercise Plan  laq band; hip abduction band,  hamstring curl band    Consulted and Agree with Plan of Care  Patient       Patient will benefit from skilled therapeutic intervention in order to improve the following deficits and impairments:  Decreased endurance, Difficulty walking, Increased muscle spasms, Decreased activity tolerance, Decreased safety awareness, Decreased strength, Decreased range of motion  Visit Diagnosis: Other abnormalities of gait and mobility  Repeated falls     Problem List Patient Active Problem List   Diagnosis Date Noted  . Breast cancer of upper-outer quadrant of left female breast ([place]) 05/14/2011  . Vitamin D deficiency 04/02/2011  . DIASTOLIC HEART FAILURE, CHRONIC 07/10/2009  . DIABETES MELLITUS, TYPE II 06/18/2009  . HYPERLIPIDEMIA 06/18/2009  . SCHIZOPHRENIA 06/18/2009  . HYPERTENSION 06/18/2009  . MI 06/18/2009  . RESPIRATORY FAILURE, ACUTE 06/18/2009  . DYSPNEA ON EXERTION 06/18/2009    Carney Living  PT DPT  03/14/2019, 3:36 PM  [facility] [address] [place], [place], [address] Phone: [phone]   Fax:  [phone]  Name: Alexandra Lamb MRN: [id number] Date of Birth: May 19, 1945

## 2019-03-23 ENCOUNTER — Encounter: Payer: Medicare Other | Admitting: Physical Therapy

## 2019-03-24 ENCOUNTER — Encounter

## 2019-03-28 ENCOUNTER — Encounter: Payer: Self-pay | Admitting: Physical Therapy

## 2019-03-28 ENCOUNTER — Ambulatory Visit: Payer: Medicare Other | Attending: Family Medicine | Admitting: Physical Therapy

## 2019-03-28 ENCOUNTER — Other Ambulatory Visit: Payer: Self-pay

## 2019-03-28 DIAGNOSIS — R2689 Other abnormalities of gait and mobility: Secondary | ICD-10-CM | POA: Diagnosis not present

## 2019-03-28 DIAGNOSIS — R296 Repeated falls: Secondary | ICD-10-CM

## 2019-03-28 NOTE — Therapy (Signed)
[place], [place], [address] Phone: [phone]   Fax:  [phone]  Physical Therapy Treatment  Patient Details  Name: Alexandra Lamb MRN: [id number] Date of Birth: 1945/07/05 Referring Provider (PT): Dr Harlan Stains    Encounter Date: 03/28/2019  PT End of Session - 03/28/19 1022    Visit Number  2    Number of Visits  12    Date for PT Re-Evaluation  04/25/19    Authorization Type  MCR / BCBS    PT Start Time  1015    PT Stop Time  1058    PT Time Calculation (min)  43 min    Activity Tolerance  Patient tolerated treatment well    Behavior During Therapy  [facility] for tasks assessed/performed       Past Medical History:  Diagnosis Date  . Cancer ([place])    left breast  . Diabetes mellitus   . Hyperlipidemia   . Hypertension   . Schizophrenia [facility])     Past Surgical History:  Procedure Laterality Date  . BREAST SURGERY  2011   left breast mast  . MASTECTOMY     left  . TOOTH EXTRACTION      There were no vitals filed for this visit.  Subjective Assessment - 03/28/19 1021    Subjective  Patient reached for the trashcan and fell. She had to crawl on her knees and crawl over to her bed to satand back up. She is not having any pain today.    Pertinent History  scizophrenia, Diabetes, Breast cancer 10+ years ago    Limitations  Standing    Currently in Pain?  No/denies                       [facility] Adult PT Treatment/Exercise - 03/28/19 0001      High Level Balance   High Level Balance Comments  narrow base eyes open 2x20 sec hold/ eyes closed 3x20 sec holds with min a.  narrow base with reaching 2x5 with each arm. Mod cuing for which hand toreach with. More difficulty reaching with the left hand. Stepping onto the air-ex. Mod a for balance. Mod cuing for technique Patient able to do the left leg but had difficulty ; standing on an air-ex 2x30 sec hold       Knee/Hip Exercises:  Seated   Long Arc Quad Limitations  2x10 yellow band     Heel Slides Limitations  2x10 yellow band     Clamshell with TheraBand  Yellow   2x10    Abd/Adduction Limitations  ball squeeze x20                PT Short Term Goals - 03/28/19 1516      PT SHORT TERM GOAL #1   Title  Patient will reduce TUG time by 5 seconds    Time  4    Period  Weeks    Status  On-going      PT SHORT TERM GOAL #2   Title  Patient will improve BERG balance score by 5 points    Time  4    Period  Weeks    Status  On-going      PT SHORT TERM GOAL #3   Title  Patient will trial use of a cane and progress to a cane if safe    Time  4    Period  Weeks  Status  On-going    Target Date  04/11/19        PT Long Term Goals - 03/14/19 1315      PT LONG TERM GOAL #1   Title  Patient will not report falls for 4 weeks    Time  8    Period  Weeks    Status  New    Target Date  05/09/19      PT LONG TERM GOAL #2   Title  Patient will score a 46 on the BERG to put herself at a moderate risk    Time  8    Period  Weeks    Status  New    Target Date  05/09/19      PT LONG TERM GOAL #3   Title  Patient will reduce TUG score to 12 seconds to reduce fall risk    Time  8    Period  Weeks    Status  New    Target Date  05/09/19            Plan - 03/28/19 1037    Clinical Impression Statement  Patient tolerated execises well. She did better with eyes closed balance exercises then expected. She had some toruble with reaching out with her left hand for a cone but never lost her balance. She tolerated ther0-ex well. Therapy will continue to work with patient on balance exercises.    Comorbidities  schizophrenia, frequnet falls,    Examination-Activity Limitations  Squat;Bend;Sit;Transfers;Stand    Examination-Participation Restrictions  Community Activity    Stability/Clinical Decision Making  Evolving/Moderate complexity    Clinical Decision Making  High    Rehab Potential  Good    PT  Frequency  2x / week    PT Duration  8 weeks    PT Treatment/Interventions  ADLs/Self Care Home Management;Cryotherapy;Electrical Stimulation;Iontophoresis 4mg /ml Dexamethasone;Moist Heat;Ultrasound;DME Instruction;Gait training;Functional mobility training;Therapeutic activities;Therapeutic exercise;Neuromuscular re-education;Patient/family education;Manual techniques;Passive range of motion;Taping    PT Next Visit Plan  continue with balance training    PT Home Exercise Plan  laq band; hip abduction band, hamstring curl band    Consulted and Agree with Plan of Care  Patient       Patient will benefit from skilled therapeutic intervention in order to improve the following deficits and impairments:  Decreased endurance, Difficulty walking, Increased muscle spasms, Decreased activity tolerance, Decreased safety awareness, Decreased strength, Decreased range of motion  Visit Diagnosis: Other abnormalities of gait and mobility  Repeated falls     Problem List Patient Active Problem List   Diagnosis Date Noted  . Breast cancer of upper-outer quadrant of left female breast ([place]) 05/14/2011  . Vitamin D deficiency 04/02/2011  . DIASTOLIC HEART FAILURE, CHRONIC 07/10/2009  . DIABETES MELLITUS, TYPE II 06/18/2009  . HYPERLIPIDEMIA 06/18/2009  . SCHIZOPHRENIA 06/18/2009  . HYPERTENSION 06/18/2009  . MI 06/18/2009  . RESPIRATORY FAILURE, ACUTE 06/18/2009  . DYSPNEA ON EXERTION 06/18/2009    Carney Living PT DPT  03/28/2019, 3:20 PM  [facility] [address] [place], [place], [address] Phone: [phone]   Fax:  [phone]  Name: Alexandra Lamb MRN: [id number] Date of Birth: [birth year]

## 2019-03-30 ENCOUNTER — Ambulatory Visit: Payer: Medicare Other | Admitting: Physical Therapy

## 2019-03-30 ENCOUNTER — Encounter: Payer: Self-pay | Admitting: Physical Therapy

## 2019-03-30 ENCOUNTER — Other Ambulatory Visit: Payer: Self-pay

## 2019-03-30 DIAGNOSIS — R296 Repeated falls: Secondary | ICD-10-CM

## 2019-03-30 DIAGNOSIS — R2689 Other abnormalities of gait and mobility: Secondary | ICD-10-CM | POA: Diagnosis not present

## 2019-03-31 ENCOUNTER — Encounter: Payer: Self-pay | Admitting: Physical Therapy

## 2019-03-31 NOTE — Therapy (Signed)
[place], [place], [address] Phone: [phone]   Fax:  [phone]  Physical Therapy Treatment  Patient Details  Name: Alexandra Lamb MRN: [id number] Date of Birth: 05-09-1945 Referring Provider (PT): Dr Harlan Stains    Encounter Date: 03/30/2019  PT End of Session - 03/30/19 1647    Visit Number  3    Number of Visits  12    Date for PT Re-Evaluation  04/25/19    Authorization Type  MCR / BCBS    PT Start Time  [id number]    PT Stop Time  1458    PT Time Calculation (min)  38 min    Activity Tolerance  Patient tolerated treatment well    Behavior During Therapy  [facility] for tasks assessed/performed       Past Medical History:  Diagnosis Date  . Cancer ([place])    left breast  . Diabetes mellitus   . Hyperlipidemia   . Hypertension   . Schizophrenia [facility])     Past Surgical History:  Procedure Laterality Date  . BREAST SURGERY  2011   left breast mast  . MASTECTOMY     left  . TOOTH EXTRACTION      There were no vitals filed for this visit.  Subjective Assessment - 03/30/19 1429    Subjective  Patient has had no falls since the last time she was here. She is having no paintoday. She feels like she is transfering sit to stand better then she was the other day.    Pertinent History  scizophrenia, Diabetes, Breast cancer 10+ years ago    Currently in Pain?  No/denies                       [facility] Adult PT Treatment/Exercise - 03/31/19 0001      High Level Balance   High Level Balance Comments  narrow base eyes closed normal 2x30 sec hold SBA, Tandem stance 2x30 sec hold each leg forward min afor balance; Air-ex narrow base 2x30 sec hold; Air-ex eyes closed mod a for balance; air-ex reaching x10 each arm mod cuing for task and mod a for balance      Knee/Hip Exercises: Seated   Long Arc Quad Limitations  2x10 yellow band     Heel Slides Limitations  2x10 red band     Clamshell with  TheraBand  Red   2x10   Abd/Adduction Limitations  ball squeeze x20              PT Education - 03/30/19 1452    Education Details  reviewed the benefits of balance exercises    Person(s) Educated  Patient;Caregiver(s)    Methods  Explanation;Demonstration;Verbal cues;Tactile cues    Comprehension  Verbalized understanding;Returned demonstration;Verbal cues required;Tactile cues required       PT Short Term Goals - 03/31/19 0943      PT SHORT TERM GOAL #1   Title  Patient will reduce TUG time by 5 seconds    Time  4    Period  Weeks    Status  On-going    Target Date  04/11/19      PT SHORT TERM GOAL #2   Title  Patient will improve BERG balance score by 5 points    Time  4    Period  Weeks    Status  On-going    Target Date  04/11/19      PT  SHORT TERM GOAL #3   Title  Patient will trial use of a cane and progress to a cane if safe    Time  4    Period  Weeks    Status  On-going    Target Date  04/11/19        PT Long Term Goals - 03/14/19 1315      PT LONG TERM GOAL #1   Title  Patient will not report falls for 4 weeks    Time  8    Period  Weeks    Status  New    Target Date  05/09/19      PT LONG TERM GOAL #2   Title  Patient will score a 46 on the BERG to put herself at a moderate risk    Time  8    Period  Weeks    Status  New    Target Date  05/09/19      PT LONG TERM GOAL #3   Title  Patient will reduce TUG score to 12 seconds to reduce fall risk    Time  8    Period  Weeks    Status  New    Target Date  05/09/19            Plan - 03/30/19 1636    Clinical Impression Statement  Patient is doing well with treatment. She reuqires frequent cuing for balance activity but this is likley going to be baseline. she came in today looking steadier with her gait. Therapy continues to work on reaching. This is an area where she freqeuntly has problems. Her caregiver was advised to continue with current exercise plan. We will continue to work on  balance exercises here.    Personal Factors and Comorbidities  Behavior Pattern;Comorbidity 1;Comorbidity 2    Comorbidities  schizophrenia, frequnet falls,    Examination-Activity Limitations  Squat;Bend;Sit;Transfers;Stand    Stability/Clinical Decision Making  Evolving/Moderate complexity    Clinical Decision Making  High    Rehab Potential  Good    PT Frequency  2x / week    PT Duration  8 weeks    PT Treatment/Interventions  ADLs/Self Care Home Management;Cryotherapy;Electrical Stimulation;Iontophoresis 4mg /ml Dexamethasone;Moist Heat;Ultrasound;DME Instruction;Gait training;Functional mobility training;Therapeutic activities;Therapeutic exercise;Neuromuscular re-education;Patient/family education;Manual techniques;Passive range of motion;Taping    PT Next Visit Plan  continue with balance training    PT Home Exercise Plan  laq band; hip abduction band, hamstring curl band    Consulted and Agree with Plan of Care  Patient       Patient will benefit from skilled therapeutic intervention in order to improve the following deficits and impairments:  Decreased endurance, Difficulty walking, Increased muscle spasms, Decreased activity tolerance, Decreased safety awareness, Decreased strength, Decreased range of motion  Visit Diagnosis: Other abnormalities of gait and mobility  Repeated falls     Problem List Patient Active Problem List   Diagnosis Date Noted  . Breast cancer of upper-outer quadrant of left female breast ([place]) 05/14/2011  . Vitamin D deficiency 04/02/2011  . DIASTOLIC HEART FAILURE, CHRONIC 07/10/2009  . DIABETES MELLITUS, TYPE II 06/18/2009  . HYPERLIPIDEMIA 06/18/2009  . SCHIZOPHRENIA 06/18/2009  . HYPERTENSION 06/18/2009  . MI 06/18/2009  . RESPIRATORY FAILURE, ACUTE 06/18/2009  . DYSPNEA ON EXERTION 06/18/2009    Carney Living PT DPT  03/31/2019, 9:45 AM  [facility] [address] [place], [place],  [address] Phone: [phone]   Fax:  [phone]  Name:  ZOYA ZOGG MRN: [id number] Date of Birth: 1945-11-23

## 2019-04-03 ENCOUNTER — Ambulatory Visit: Payer: Medicare Other | Admitting: Physical Therapy

## 2019-04-05 ENCOUNTER — Ambulatory Visit: Payer: Medicare Other | Admitting: Physical Therapy

## 2019-04-10 ENCOUNTER — Ambulatory Visit: Payer: Medicare Other | Admitting: Physical Therapy

## 2019-04-10 ENCOUNTER — Other Ambulatory Visit: Payer: Self-pay

## 2019-04-10 ENCOUNTER — Encounter: Payer: Self-pay | Admitting: Physical Therapy

## 2019-04-10 DIAGNOSIS — R2689 Other abnormalities of gait and mobility: Secondary | ICD-10-CM | POA: Diagnosis not present

## 2019-04-10 DIAGNOSIS — R296 Repeated falls: Secondary | ICD-10-CM

## 2019-04-10 NOTE — Therapy (Signed)
[place], [place], [address] Phone: [phone]   Fax:  [phone]  Physical Therapy Treatment  Patient Details  Name: Alexandra Lamb MRN: [id number] Date of Birth: [birth year] Referring Provider (PT): Dr Harlan Stains    Encounter Date: 04/10/2019  PT End of Session - 04/10/19 1619    Visit Number  4    Number of Visits  12    Date for PT Re-Evaluation  04/25/19    Authorization Type  MCR / BCBS    PT Start Time  1334    PT Stop Time  1413    PT Time Calculation (min)  39 min    Activity Tolerance  Patient tolerated treatment well    Behavior During Therapy  [facility] for tasks assessed/performed       Past Medical History:  Diagnosis Date  . Cancer ([place])    left breast  . Diabetes mellitus   . Hyperlipidemia   . Hypertension   . Schizophrenia [facility])     Past Surgical History:  Procedure Laterality Date  . BREAST SURGERY  2011   left breast mast  . MASTECTOMY     left  . TOOTH EXTRACTION      There were no vitals filed for this visit.  Subjective Assessment - 04/10/19 1340    Subjective  Patient has not had any falls. She reports she did havie to get out of an awkward chair.    Pertinent History  scizophrenia, Diabetes, Breast cancer 10+ years ago    Limitations  Standing    Currently in Pain?  No/denies                       [facility] Adult PT Treatment/Exercise - 04/10/19 0001      High Level Balance   High Level Balance Comments  narrow base eyes closed normal 2x30 sec hold SBA, Tandem stance 2x30 sec hold each leg forward min afor balance; Air-ex narrow base 2x30 sec hold; Air-ex eyes closed mod a for balance; air-ex reaching x10 each arm mod cuing for task and mod a for balance      Knee/Hip Exercises: Standing   Heel Raises Limitations  x20     Knee Flexion Limitations  standing march x10 each leg     Abduction Limitations  x20       Knee/Hip Exercises: Seated   Long Arc Quad Limitations  2x10 yellow band     Heel Slides Limitations  2x10 red band     Clamshell with TheraBand  Red   2x10   Abd/Adduction Limitations  ball squeeze x20              PT Education - 04/10/19 1340    Education Details  reviewed balance exercises    Person(s) Educated  Patient    Methods  Explanation;Demonstration;Tactile cues;Verbal cues    Comprehension  Verbalized understanding;Returned demonstration;Verbal cues required;Tactile cues required       PT Short Term Goals - 04/10/19 1624      PT SHORT TERM GOAL #1   Title  Patient will reduce TUG time by 5 seconds    Time  4    Period  Weeks    Status  On-going    Target Date  04/11/19      PT SHORT TERM GOAL #2   Title  Patient will improve BERG balance score by 5 points    Time  4  Period  Weeks    Status  On-going    Target Date  04/11/19      PT SHORT TERM GOAL #3   Title  Patient will trial use of a cane and progress to a cane if safe    Time  4    Period  Weeks    Status  On-going    Target Date  04/11/19        PT Long Term Goals - 03/14/19 1315      PT LONG TERM GOAL #1   Title  Patient will not report falls for 4 weeks    Time  8    Period  Weeks    Status  New    Target Date  05/09/19      PT LONG TERM GOAL #2   Title  Patient will score a 46 on the BERG to put herself at a moderate risk    Time  8    Period  Weeks    Status  New    Target Date  05/09/19      PT LONG TERM GOAL #3   Title  Patient will reduce TUG score to 12 seconds to reduce fall risk    Time  8    Period  Weeks    Status  New    Target Date  05/09/19            Plan - 04/10/19 1620    Clinical Impression Statement  Patient perfromed standing exercises today. per patient and caregver she does standing exercises in the afternoon. She continues to tolerate balance exercises well. therapy reviewed balance exercises with her caregiver. She is doing a lot of the activity that she is doing at  therapy already. Therapy will review ther-ex for home program. He will likley be able to D/C to HEP next visit.    Personal Factors and Comorbidities  Behavior Pattern;Comorbidity 1;Comorbidity 2    Comorbidities  schizophrenia, frequnet falls,    Examination-Activity Limitations  Squat;Bend;Sit;Transfers;Stand    Stability/Clinical Decision Making  Evolving/Moderate complexity    Clinical Decision Making  High    Rehab Potential  Good    PT Frequency  2x / week    PT Duration  8 weeks    PT Treatment/Interventions  ADLs/Self Care Home Management;Cryotherapy;Electrical Stimulation;Iontophoresis 4mg /ml Dexamethasone;Moist Heat;Ultrasound;DME Instruction;Gait training;Functional mobility training;Therapeutic activities;Therapeutic exercise;Neuromuscular re-education;Patient/family education;Manual techniques;Passive range of motion;Taping    PT Next Visit Plan  continue with balance training    PT Home Exercise Plan  laq band; hip abduction band, hamstring curl band    Consulted and Agree with Plan of Care  Patient       Patient will benefit from skilled therapeutic intervention in order to improve the following deficits and impairments:  Decreased endurance, Difficulty walking, Increased muscle spasms, Decreased activity tolerance, Decreased safety awareness, Decreased strength, Decreased range of motion  Visit Diagnosis: Repeated falls     Problem List Patient Active Problem List   Diagnosis Date Noted  . Breast cancer of upper-outer quadrant of left female breast ([place]) 05/14/2011  . Vitamin D deficiency 04/02/2011  . DIASTOLIC HEART FAILURE, CHRONIC 07/10/2009  . DIABETES MELLITUS, TYPE II 06/18/2009  . HYPERLIPIDEMIA 06/18/2009  . SCHIZOPHRENIA 06/18/2009  . HYPERTENSION 06/18/2009  . MI 06/18/2009  . RESPIRATORY FAILURE, ACUTE 06/18/2009  . DYSPNEA ON EXERTION 06/18/2009    Carney Living PT DPT  04/10/2019, 4:26 PM  [facility]  [place], [place], [address] Phone: [phone]   Fax:  [phone]  Name: Alexandra Lamb MRN: [id number] Date of Birth: Mar 27, 1946

## 2019-04-12 ENCOUNTER — Ambulatory Visit: Payer: Medicare Other | Admitting: Physical Therapy

## 2019-04-12 ENCOUNTER — Encounter: Payer: Self-pay | Admitting: Physical Therapy

## 2019-04-12 ENCOUNTER — Other Ambulatory Visit: Payer: Self-pay

## 2019-04-12 DIAGNOSIS — R2689 Other abnormalities of gait and mobility: Secondary | ICD-10-CM | POA: Diagnosis not present

## 2019-04-12 DIAGNOSIS — R296 Repeated falls: Secondary | ICD-10-CM

## 2019-04-13 ENCOUNTER — Encounter: Payer: Self-pay | Admitting: Physical Therapy

## 2019-04-13 NOTE — Therapy (Addendum)
[place], [place], [address] Phone: [phone]   Fax:  [phone]  Physical Therapy Treatment/Discharge   Patient Details  Name: Alexandra Lamb MRN: [id number] Date of Birth: July 04, 1945 Referring Provider (PT): Dr Harlan Stains    Encounter Date: 04/12/2019  PT End of Session - 04/12/19 1636    Visit Number  5    Number of Visits  12    Date for PT Re-Evaluation  04/25/19    Authorization Type  MCR / BCBS    PT Start Time  1540    PT Stop Time  1626    PT Time Calculation (min)  46 min    Activity Tolerance  Patient tolerated treatment well    Behavior During Therapy  [facility] for tasks assessed/performed       Past Medical History:  Diagnosis Date  . Cancer ([place])    left breast  . Diabetes mellitus   . Hyperlipidemia   . Hypertension   . Schizophrenia [facility])     Past Surgical History:  Procedure Laterality Date  . BREAST SURGERY  2011   left breast mast  . MASTECTOMY     left  . TOOTH EXTRACTION      There were no vitals filed for this visit.  Subjective Assessment - 04/12/19 1557    Subjective  Patient has no complaints today. She has had no falls. She has been working on her seated pedals at home. She is a little more tried today comprared to the other day.    Pertinent History  scizophrenia, Diabetes, Breast cancer 10+ years ago    Limitations  Standing    Currently in Pain?  No/denies                       [facility] Adult PT Treatment/Exercise - 04/13/19 0001      High Level Balance   High Level Balance Comments  narrow base eo/ec and with reaching 2x30 sec eahc; tandem stance eo and ec 2x20 sec each; rocking back on heels 2x10 patient was nervosu about going back on her heels,; narrow base on balance mat required min-> mod a to keep balance posteriorly       Knee/Hip Exercises: Standing   Heel Raises Limitations  x20     Knee Flexion Limitations  standing march x10  each leg     Abduction Limitations  x20       Knee/Hip Exercises: Seated   Long Arc Quad Limitations  2x10 yellow band     Heel Slides Limitations  2x10 green band     Clamshell with TheraBand  Green   2x10   Abd/Adduction Limitations  ball squeeze x20        Self care: reviewed balance exercises with caregiver       PT Education - 04/12/19 1636    Education Details  reviewed final HEP with her and her care giver    Person(s) Educated  Patient    Methods  Explanation;Demonstration;Tactile cues;Verbal cues    Comprehension  Verbalized understanding;Returned demonstration;Verbal cues required;Tactile cues required       PT Short Term Goals - 04/10/19 1624      PT SHORT TERM GOAL #1   Title  Patient will reduce TUG time by 5 seconds    Time  4    Period  Weeks    Status  On-going    Target Date  04/11/19  PT SHORT TERM GOAL #2   Title  Patient will improve BERG balance score by 5 points    Time  4    Period  Weeks    Status  On-going    Target Date  04/11/19      PT SHORT TERM GOAL #3   Title  Patient will trial use of a cane and progress to a cane if safe    Time  4    Period  Weeks    Status  On-going    Target Date  04/11/19        PT Long Term Goals - 03/14/19 1315      PT LONG TERM GOAL #1   Title  Patient will not report falls for 4 weeks    Time  8    Period  Weeks    Status  New    Target Date  05/09/19      PT LONG TERM GOAL #2   Title  Patient will score a 46 on the BERG to put herself at a moderate risk    Time  8    Period  Weeks    Status  New    Target Date  05/09/19      PT LONG TERM GOAL #3   Title  Patient will reduce TUG score to 12 seconds to reduce fall risk    Time  8    Period  Weeks    Status  New    Target Date  05/09/19            Plan - 04/12/19 1637    Clinical Impression Statement  Patient has progressed well. She had good balance with narrow base and tandem stance exercises. She required some assistance  with balance mat exercises. She has not fallen in a few weeks now. Her caregivers are doing her exercises with her daily. They feel comfortable integrating her balance exercises into her program. At this time given the increased risk of COVID-19 there is nothing that we are doing here that they dont feel comfortable doing. The patient will be discharged to HEP.    Comorbidities  schizophrenia, frequnet falls,    Examination-Activity Limitations  Squat;Bend;Sit;Transfers;Stand    Examination-Participation Restrictions  Community Activity    Stability/Clinical Decision Making  Evolving/Moderate complexity    Clinical Decision Making  High    Rehab Potential  Good    PT Frequency  2x / week    PT Duration  8 weeks    PT Treatment/Interventions  ADLs/Self Care Home Management;Cryotherapy;Electrical Stimulation;Iontophoresis 40m/ml Dexamethasone;Moist Heat;Ultrasound;DME Instruction;Gait training;Functional mobility training;Therapeutic activities;Therapeutic exercise;Neuromuscular re-education;Patient/family education;Manual techniques;Passive range of motion;Taping    PT Next Visit Plan  continue with balance training    PT Home Exercise Plan  laq band; hip abduction band, hamstring curl band    Consulted and Agree with Plan of Care  Patient       Patient will benefit from skilled therapeutic intervention in order to improve the following deficits and impairments:  Decreased endurance, Difficulty walking, Increased muscle spasms, Decreased activity tolerance, Decreased safety awareness, Decreased strength, Decreased range of motion  Visit Diagnosis: Repeated falls  Other abnormalities of gait and mobility  PHYSICAL THERAPY DISCHARGE SUMMARY  Visits from Start of Care: 5  Current functional level related to goals / functional outcomes: Improved balance    Remaining deficits: becomes fatigued at times    Education / Equipment: HEP Plan: Patient agrees to discharge.  Patient goals were  not met. Patient is being discharged due to meeting the stated rehab goals.  ?????       Problem List Patient Active Problem List   Diagnosis Date Noted  . Breast cancer of upper-outer quadrant of left female breast ([place]) 05/14/2011  . Vitamin D deficiency 04/02/2011  . DIASTOLIC HEART FAILURE, CHRONIC 07/10/2009  . DIABETES MELLITUS, TYPE II 06/18/2009  . HYPERLIPIDEMIA 06/18/2009  . SCHIZOPHRENIA 06/18/2009  . HYPERTENSION 06/18/2009  . MI 06/18/2009  . RESPIRATORY FAILURE, ACUTE 06/18/2009  . DYSPNEA ON EXERTION 06/18/2009     Carney Living PT DPT  04/13/2019, 9:12 AM  [facility] [address] [place], [place], [address] Phone: [phone]   Fax:  [phone]  Name: TIFFAY PINETTE MRN: [id number] Date of Birth: 03-06-1946

## 2019-09-20 DIAGNOSIS — F200 Paranoid schizophrenia: Secondary | ICD-10-CM | POA: Diagnosis not present

## 2019-09-20 DIAGNOSIS — G3184 Mild cognitive impairment, so stated: Secondary | ICD-10-CM | POA: Diagnosis not present

## 2019-11-08 DIAGNOSIS — F200 Paranoid schizophrenia: Secondary | ICD-10-CM | POA: Diagnosis not present

## 2019-11-21 DIAGNOSIS — F209 Schizophrenia, unspecified: Secondary | ICD-10-CM | POA: Diagnosis not present

## 2019-11-21 DIAGNOSIS — Z7189 Other specified counseling: Secondary | ICD-10-CM | POA: Diagnosis not present

## 2019-11-21 DIAGNOSIS — R5381 Other malaise: Secondary | ICD-10-CM | POA: Diagnosis not present

## 2019-12-13 DIAGNOSIS — R946 Abnormal results of thyroid function studies: Secondary | ICD-10-CM | POA: Diagnosis not present

## 2019-12-13 DIAGNOSIS — C50912 Malignant neoplasm of unspecified site of left female breast: Secondary | ICD-10-CM | POA: Diagnosis not present

## 2019-12-13 DIAGNOSIS — Z1159 Encounter for screening for other viral diseases: Secondary | ICD-10-CM | POA: Diagnosis not present

## 2019-12-13 DIAGNOSIS — Z0000 Encounter for general adult medical examination without abnormal findings: Secondary | ICD-10-CM | POA: Diagnosis not present

## 2019-12-13 DIAGNOSIS — E785 Hyperlipidemia, unspecified: Secondary | ICD-10-CM | POA: Diagnosis not present

## 2019-12-13 DIAGNOSIS — R32 Unspecified urinary incontinence: Secondary | ICD-10-CM | POA: Diagnosis not present

## 2019-12-13 DIAGNOSIS — R296 Repeated falls: Secondary | ICD-10-CM | POA: Diagnosis not present

## 2019-12-13 DIAGNOSIS — M8588 Other specified disorders of bone density and structure, other site: Secondary | ICD-10-CM | POA: Diagnosis not present

## 2019-12-13 DIAGNOSIS — H6121 Impacted cerumen, right ear: Secondary | ICD-10-CM | POA: Diagnosis not present

## 2019-12-13 DIAGNOSIS — I10 Essential (primary) hypertension: Secondary | ICD-10-CM | POA: Diagnosis not present

## 2019-12-13 DIAGNOSIS — E1169 Type 2 diabetes mellitus with other specified complication: Secondary | ICD-10-CM | POA: Diagnosis not present

## 2019-12-13 DIAGNOSIS — H9190 Unspecified hearing loss, unspecified ear: Secondary | ICD-10-CM | POA: Diagnosis not present

## 2019-12-18 DIAGNOSIS — I10 Essential (primary) hypertension: Secondary | ICD-10-CM | POA: Diagnosis not present

## 2019-12-18 DIAGNOSIS — Z87891 Personal history of nicotine dependence: Secondary | ICD-10-CM | POA: Diagnosis not present

## 2019-12-18 DIAGNOSIS — Z8701 Personal history of pneumonia (recurrent): Secondary | ICD-10-CM | POA: Diagnosis not present

## 2019-12-18 DIAGNOSIS — H409 Unspecified glaucoma: Secondary | ICD-10-CM | POA: Diagnosis not present

## 2019-12-18 DIAGNOSIS — M8580 Other specified disorders of bone density and structure, unspecified site: Secondary | ICD-10-CM | POA: Diagnosis not present

## 2019-12-18 DIAGNOSIS — H6121 Impacted cerumen, right ear: Secondary | ICD-10-CM | POA: Diagnosis not present

## 2019-12-18 DIAGNOSIS — E1169 Type 2 diabetes mellitus with other specified complication: Secondary | ICD-10-CM | POA: Diagnosis not present

## 2019-12-18 DIAGNOSIS — H9190 Unspecified hearing loss, unspecified ear: Secondary | ICD-10-CM | POA: Diagnosis not present

## 2019-12-18 DIAGNOSIS — E785 Hyperlipidemia, unspecified: Secondary | ICD-10-CM | POA: Diagnosis not present

## 2019-12-18 DIAGNOSIS — F209 Schizophrenia, unspecified: Secondary | ICD-10-CM | POA: Diagnosis not present

## 2019-12-18 DIAGNOSIS — Z9181 History of falling: Secondary | ICD-10-CM | POA: Diagnosis not present

## 2019-12-18 DIAGNOSIS — C50912 Malignant neoplasm of unspecified site of left female breast: Secondary | ICD-10-CM | POA: Diagnosis not present

## 2019-12-18 DIAGNOSIS — E7800 Pure hypercholesterolemia, unspecified: Secondary | ICD-10-CM | POA: Diagnosis not present

## 2019-12-19 DIAGNOSIS — F200 Paranoid schizophrenia: Secondary | ICD-10-CM | POA: Diagnosis not present

## 2019-12-20 DIAGNOSIS — R300 Dysuria: Secondary | ICD-10-CM | POA: Diagnosis not present

## 2019-12-21 DIAGNOSIS — H2511 Age-related nuclear cataract, right eye: Secondary | ICD-10-CM | POA: Diagnosis not present

## 2019-12-21 DIAGNOSIS — H401132 Primary open-angle glaucoma, bilateral, moderate stage: Secondary | ICD-10-CM | POA: Diagnosis not present

## 2019-12-26 DIAGNOSIS — C50912 Malignant neoplasm of unspecified site of left female breast: Secondary | ICD-10-CM | POA: Diagnosis not present

## 2019-12-26 DIAGNOSIS — F209 Schizophrenia, unspecified: Secondary | ICD-10-CM | POA: Diagnosis not present

## 2019-12-26 DIAGNOSIS — E785 Hyperlipidemia, unspecified: Secondary | ICD-10-CM | POA: Diagnosis not present

## 2019-12-26 DIAGNOSIS — E1169 Type 2 diabetes mellitus with other specified complication: Secondary | ICD-10-CM | POA: Diagnosis not present

## 2019-12-26 DIAGNOSIS — H409 Unspecified glaucoma: Secondary | ICD-10-CM | POA: Diagnosis not present

## 2019-12-26 DIAGNOSIS — I10 Essential (primary) hypertension: Secondary | ICD-10-CM | POA: Diagnosis not present

## 2019-12-27 DIAGNOSIS — F209 Schizophrenia, unspecified: Secondary | ICD-10-CM | POA: Diagnosis not present

## 2019-12-27 DIAGNOSIS — C50912 Malignant neoplasm of unspecified site of left female breast: Secondary | ICD-10-CM | POA: Diagnosis not present

## 2019-12-27 DIAGNOSIS — I10 Essential (primary) hypertension: Secondary | ICD-10-CM | POA: Diagnosis not present

## 2019-12-27 DIAGNOSIS — E1169 Type 2 diabetes mellitus with other specified complication: Secondary | ICD-10-CM | POA: Diagnosis not present

## 2019-12-27 DIAGNOSIS — E785 Hyperlipidemia, unspecified: Secondary | ICD-10-CM | POA: Diagnosis not present

## 2019-12-27 DIAGNOSIS — H409 Unspecified glaucoma: Secondary | ICD-10-CM | POA: Diagnosis not present

## 2020-01-04 DIAGNOSIS — F209 Schizophrenia, unspecified: Secondary | ICD-10-CM | POA: Diagnosis not present

## 2020-01-04 DIAGNOSIS — E1169 Type 2 diabetes mellitus with other specified complication: Secondary | ICD-10-CM | POA: Diagnosis not present

## 2020-01-04 DIAGNOSIS — H409 Unspecified glaucoma: Secondary | ICD-10-CM | POA: Diagnosis not present

## 2020-01-04 DIAGNOSIS — C50912 Malignant neoplasm of unspecified site of left female breast: Secondary | ICD-10-CM | POA: Diagnosis not present

## 2020-01-04 DIAGNOSIS — E785 Hyperlipidemia, unspecified: Secondary | ICD-10-CM | POA: Diagnosis not present

## 2020-01-04 DIAGNOSIS — I10 Essential (primary) hypertension: Secondary | ICD-10-CM | POA: Diagnosis not present

## 2020-01-08 DIAGNOSIS — E785 Hyperlipidemia, unspecified: Secondary | ICD-10-CM | POA: Diagnosis not present

## 2020-01-08 DIAGNOSIS — F209 Schizophrenia, unspecified: Secondary | ICD-10-CM | POA: Diagnosis not present

## 2020-01-08 DIAGNOSIS — H409 Unspecified glaucoma: Secondary | ICD-10-CM | POA: Diagnosis not present

## 2020-01-08 DIAGNOSIS — I10 Essential (primary) hypertension: Secondary | ICD-10-CM | POA: Diagnosis not present

## 2020-01-08 DIAGNOSIS — E1169 Type 2 diabetes mellitus with other specified complication: Secondary | ICD-10-CM | POA: Diagnosis not present

## 2020-01-08 DIAGNOSIS — C50912 Malignant neoplasm of unspecified site of left female breast: Secondary | ICD-10-CM | POA: Diagnosis not present

## 2020-01-17 DIAGNOSIS — I10 Essential (primary) hypertension: Secondary | ICD-10-CM | POA: Diagnosis not present

## 2020-01-18 DIAGNOSIS — Z23 Encounter for immunization: Secondary | ICD-10-CM | POA: Diagnosis not present

## 2020-01-29 DIAGNOSIS — E119 Type 2 diabetes mellitus without complications: Secondary | ICD-10-CM | POA: Diagnosis not present

## 2020-01-29 DIAGNOSIS — H903 Sensorineural hearing loss, bilateral: Secondary | ICD-10-CM | POA: Diagnosis not present

## 2020-01-29 DIAGNOSIS — Z23 Encounter for immunization: Secondary | ICD-10-CM | POA: Diagnosis not present

## 2020-01-29 DIAGNOSIS — F209 Schizophrenia, unspecified: Secondary | ICD-10-CM | POA: Diagnosis not present

## 2020-01-30 DIAGNOSIS — M85851 Other specified disorders of bone density and structure, right thigh: Secondary | ICD-10-CM | POA: Diagnosis not present

## 2020-01-30 DIAGNOSIS — M85852 Other specified disorders of bone density and structure, left thigh: Secondary | ICD-10-CM | POA: Diagnosis not present

## 2020-01-30 DIAGNOSIS — Z1231 Encounter for screening mammogram for malignant neoplasm of breast: Secondary | ICD-10-CM | POA: Diagnosis not present

## 2020-02-02 DIAGNOSIS — G3184 Mild cognitive impairment, so stated: Secondary | ICD-10-CM | POA: Diagnosis not present

## 2020-02-02 DIAGNOSIS — F200 Paranoid schizophrenia: Secondary | ICD-10-CM | POA: Diagnosis not present

## 2020-02-14 ENCOUNTER — Other Ambulatory Visit: Payer: Self-pay | Admitting: *Deleted

## 2020-02-14 ENCOUNTER — Encounter: Payer: Self-pay | Admitting: *Deleted

## 2020-02-16 ENCOUNTER — Encounter: Payer: Self-pay | Admitting: Neurology

## 2020-02-16 ENCOUNTER — Ambulatory Visit (INDEPENDENT_AMBULATORY_CARE_PROVIDER_SITE_OTHER): Payer: Medicare Other | Admitting: Neurology

## 2020-02-16 VITALS — BP 184/81 | HR 73 | Ht 63.0 in | Wt 158.0 lb

## 2020-02-16 DIAGNOSIS — Z79899 Other long term (current) drug therapy: Secondary | ICD-10-CM | POA: Diagnosis not present

## 2020-02-16 DIAGNOSIS — R410 Disorientation, unspecified: Secondary | ICD-10-CM | POA: Diagnosis not present

## 2020-02-16 DIAGNOSIS — R4189 Other symptoms and signs involving cognitive functions and awareness: Secondary | ICD-10-CM

## 2020-02-16 NOTE — Progress Notes (Signed)
[facility]    Provider:  Dr Jaynee Eagles Requesting Provider: Norma Fredrickson, MD Primary Care Provider:  Harlan Stains, MD  CC:  Evaluation of cognition  HPI:  Alexandra Lamb is a 74 y.o. female here as requested by Norma Fredrickson, MD for cognitive decline. This is an urgent referral from Noemi Chapel at [facility].  Patient has a past medical history of paranoid schizophrenia, diabetes recent HgbA1c approx 8, COPD, HTN, HLD, obesity,     Patient is here with sister. Sister provides much information. On June 2nd Noemi Chapel diagnosed her with some mild neurocognitive deficits. Lattie Haw started [employer]. Sister called Lattie Haw and the group home [facility], Clinical biochemist stated this medication was for dementia and sister was concerned. Patient states she remembers her childhood, she remembers her good times, patient very pleasant, the pandemic and the isolation has been very hard on her, prior to the pandemic she was going to senior center, suddenly it was all gone, not as much stimulation. More mood and "down in the dumps" rather than memory loss. Also there was a death at the facility and that was a big blow to everyone. Sister and patient not feel that over the last year there have been no significant changes in her ability to perfor, ADLs, she has obviously needed help with IADLs but that is not new. Patient feels her gait is improving, she has some bathroom hygiene problems and needs some mild help with ADLs but no changes. She can rrange her room, she tried to do her own housekeeping and she has improved with that.  She loves going to thrift shops. She says she sleeps well.  Patient's sister states sometime she is rambling, these are not new, needs help with hygiene and this is not new, she is sedentary so she has a PT evaluation, sometimes patient is confused but again no progression. She has limitations but no significant decline except there is less willingness to  assist patient and less help than with prior administration in the facility. No family   Reviewed notes, labs and imaging from outside physicians, which showed: see above   Reviewed notes from Noemi Chapel: however it is well controlled with medications, a change in facility management has created some turmoil in the group home would like to say she has Alzheimer's and that she cannot be there any longer, Noemi Chapel is cared for patient for years and this does not seem to be the case, diagnosed with paranoid schizophrenia and mild cognitive impairment however no history of dementia and very competent provider Noemi Chapel who has provided comprehensive and thorough care does not believe she has a diagnosis of dementia.  I discussed case with Noemi Chapel, patient sister is her guardian Charlean Merl and sister from [place] and [place] also involved in care.  They discussed mild cognitive impairment, they also discussed there is no diagnosis of Alzheimer's, currently the family has been told by administration at [facility] that she has a diagnosis of Alzheimer's it is not the case, and if they had read the note they would have seen the diagnosis with mild cognitive impairment.  Because of this an accurate diagnosis of Alzheimer's, they want to move her, this is not in the best interest of patient especially since there is no Alzheimer's diagnosis, patient's sisters are concerned that the administration is assuming too much about the disorder specially since she does not have it.  May wake up during the night  but does not leave her room.  Referred here for evaluation.    Review of Systems: Patient complains of symptoms per HPI as well as the following symptoms: no abdominal pain, nausea, vomiting, chestpain. Pertinent negatives and positives per HPI. All others negative.   Social History   Socioeconomic History  . Marital status: Widowed    Spouse name: Not on file  . Number of children: Not  on file  . Years of education: Not on file  . Highest education level: Not on file  Occupational History  . Not on file  Tobacco Use  . Smoking status: Former Smoker    Quit date: 04/21/2007    Years since quitting: 12.8  . Smokeless tobacco: Never Used  Substance and Sexual Activity  . Alcohol use: No  . Drug use: No  . Sexual activity: Not Currently  Other Topics Concern  . Not on file  Social History Narrative   Resides at [facility] Strain:   . Difficulty of Paying Living Expenses: Not on file  Food Insecurity:   . Worried About Charity fundraiser in the Last Year: Not on file  . Ran Out of Food in the Last Year: Not on file  Transportation Needs:   . Lack of Transportation (Medical): Not on file  . Lack of Transportation (Non-Medical): Not on file  Physical Activity:   . Days of Exercise per Week: Not on file  . Minutes of Exercise per Session: Not on file  Stress:   . Feeling of Stress : Not on file  Social Connections:   . Frequency of Communication with Friends and Family: Not on file  . Frequency of Social Gatherings with Friends and Family: Not on file  . Attends Religious Services: Not on file  . Active Member of Clubs or Organizations: Not on file  . Attends Archivist Meetings: Not on file  . Marital Status: Not on file  Intimate Partner Violence:   . Fear of Current or Ex-Partner: Not on file  . Emotionally Abused: Not on file  . Physically Abused: Not on file  . Sexually Abused: Not on file    Family History  Problem Relation Age of Onset  . Cancer Maternal Aunt        brain tumor  . Dementia Neg Hx     Past Medical History:  Diagnosis Date  . Cancer ([place])    left breast  . Diabetes mellitus   . Hyperlipidemia   . Hypertension   . MCI (mild cognitive impairment)   . Schizophrenia [facility])    paranoid    Patient Active Problem List   Diagnosis Date Noted  . Cognitive  impairment 02/19/2020  . Breast cancer of upper-outer quadrant of left female breast ([place]) 05/14/2011  . Vitamin D deficiency 04/02/2011  . DIASTOLIC HEART FAILURE, CHRONIC 07/10/2009  . DIABETES MELLITUS, TYPE II 06/18/2009  . HYPERLIPIDEMIA 06/18/2009  . SCHIZOPHRENIA 06/18/2009  . HYPERTENSION 06/18/2009  . MI 06/18/2009  . RESPIRATORY FAILURE, ACUTE 06/18/2009  . DYSPNEA ON EXERTION 06/18/2009    Past Surgical History:  Procedure Laterality Date  . BREAST SURGERY  2011   left breast mast  . MASTECTOMY     left  . TOOTH EXTRACTION      Current Outpatient Medications  Medication Sig Dispense Refill  . acetaminophen (TYLENOL) 500 MG tablet Take 500 mg by mouth every 6 (six) hours as needed.      Marland Kitchen  amLODipine (NORVASC) 5 MG tablet Take 5 mg by mouth daily.      Marland Kitchen atenolol (TENORMIN) 50 MG tablet Take 25 mg by mouth daily.     . calcium carbonate (TUMS - DOSED IN MG ELEMENTAL CALCIUM) 500 MG chewable tablet Chew 1 tablet by mouth as needed.     . calcium-vitamin D (OSCAL WITH D) 500-200 MG-UNIT tablet Take 1 tablet by mouth daily. 90 tablet 3  . Cholecalciferol (RA VITAMIN D-3) 2000 units CAPS Take 1 capsule (2,000 Units total) by mouth daily. 90 capsule 3  . divalproex (DEPAKOTE ER) 500 MG 24 hr tablet     . latanoprost (XALATAN) 0.005 % ophthalmic solution Place 1 drop into both eyes at bedtime.    Marland Kitchen losartan (COZAAR) 100 MG tablet Take 100 mg by mouth daily.      . memantine (NAMENDA) 5 MG tablet Take 5 mg by mouth 2 (two) times daily.    . Multiple Vitamins-Minerals (MULTIVITAMIN WITH MINERALS) tablet Take 1 tablet by mouth daily.      Marland Kitchen OLANZapine (ZYPREXA) 5 MG tablet Take 5 mg by mouth at bedtime. One in the am and three at bedtime.    . pioglitazone (ACTOS) 30 MG tablet Take 15 mg by mouth daily.     Marland Kitchen RA COL-RITE 100 MG capsule Take 100 mg by mouth daily.   0  . simvastatin (ZOCOR) 10 MG tablet Take 10 mg by mouth daily.    . tamoxifen (NOLVADEX) 20 MG tablet Take 1  tablet (20 mg total) by mouth daily. 90 tablet 3   No current facility-administered medications for this visit.    Allergies as of 02/16/2020  . (No Known Allergies)    Vitals: BP (!) 184/81   Pulse 73   Ht 5\' 3"  (1.6 m)   Wt 158 lb (71.7 kg)   BMI 27.99 kg/m  Last Weight:  Wt Readings from Last 1 Encounters:  02/16/20 158 lb (71.7 kg)   Last Height:   Ht Readings from Last 1 Encounters:  02/16/20 5\' 3"  (1.6 m)    Physical exam: Exam: Gen: NAD, conversant, tangential, well groomed                     CV: RRR, no MRG. No Carotid Bruits. No peripheral edema, warm, nontender Eyes: Conjunctivae clear without exudates or hemorrhage  Neuro: Detailed Neurologic Exam  Speech: dysarthria    Cognition:  MMSE - Mini Mental State Exam 02/16/2020  Orientation to time 5  Orientation to Place 4  Registration 3  Attention/ Calculation 0  Recall 0  Language- name 2 objects 2  Language- repeat 1  Language- follow 3 step command 0  Language- read & follow direction 1  Write a sentence 1  Copy design 1  Total score 18    Cranial Nerves:    The pupils are equal, round, and reactive to light.Pupils too small to visualize fundi. Visual fields are full to finger confrontation. Extraocular movements are intact. Trigeminal sensation is intact and the muscles of mastication are normal. The face is symmetric. The palate elevates in the midline. Hearing impaired. Voice is normal. Shoulder shrug is normal. The tongue has normal motion without fasciculations.   Coordination:    No ataxia or dysmetria on FTN   Gait:    Can get up indeoendently, walk on heels and toes, can get up on the examination table independently. Shuffling and wide based.  Motor Observation:  . Mild postural  and action tremor Tone:    Normal muscle tone.    Posture:    Posture is normal. normal erect    Strength:    Strength is V/V in the upper and lower limbs.      Sensation: intact to LT     Reflex  Exam:  DTR's:    Deep tendon reflexes in the upper and lower extremities are symmetrical bilaterally.   Toes:    The toes are equivocal bilaterally.   Clonus:    Clonus is absent.    Assessment/Plan:  74 y.o.lovely female here as requested by Noemi Chapel for cognitive evaluation. This is an urgent referral from Noemi Chapel at [facility].  Patient has a past medical history of paranoid schizophrenia, diabetes recent HgbA1c approx 8, COPD, HTN, HLD, obesity,   Group home appears to think she has Alzheimer's disease which has not been diagnosed. Patient has stable cognitive deficits secondary to psychiatric illness. Her deficits have not significantly worsened and are not progressive at this time. Patient's sister states sometimes she is rambling in speech and thoughts,  needs help with hygiene for example but these issues are not new. Sometimes patient is confused and needs slight redirection but again this is her stable baseline and not new, not progressive. She has limitations and needs help with some ADLS and most IADLS but no significant decline in her abilities. It sounds as though the problem is that there is less willingness to assist patient and less help with new facility administration rather than patient needing more support.  Alzheimer's is a progressive neurodegenerative disorder of a specific area of the brain. I see no evidence for Alzheimer's dementia in this patient or a neurodegenerative progressive brain disorder at this time. She may be on Namenda(which is approved for Alzheimers) but this medication can be used off label and not just for alzheimer's patient. We even use Namenda in headache patients; medications can be used off label for causes other than what they are FDA approved for.  We will order some blood work, get a CT of the head and continue to follow patient. 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   Bloodwork, CT of the head.  She has a PT and OT referral and working with Dr. Dema Severin for management of medical conditions She is also hearing impaired, this can be mistaken as worsening cognition but patient is getting hearing aids  Also TSH >7, may be hypothyroid, needs follow up with primary care  Orders Placed This Encounter  Procedures  . CT HEAD WO CONTRAST  . Vitamin B1  . B12 and Folate Panel  . Methylmalonic acid, serum  . Homocysteine  . TSH   No orders of the defined types were placed in this encounter.   Cc: Greig Castilla, MD   I spent over 120 minutes of face-to-face and non-face-to-face time with patient on the  1. Cognitive impairment   2. Long-term use of high-risk medication   3. Confusion    diagnosis.  This included previsit chart review, lab review, study review, order entry, electronic health record documentation, patient education on the different diagnostic and therapeutic options, counseling and coordination of care, risks and benefits of management, compliance, or risk factor reduction    Sarina Ill, MD  [facility] [address] [place], [place] [address]  Phone [phone] Fax [phone]

## 2020-02-16 NOTE — Patient Instructions (Addendum)
Blood work CT of the head    Mild Neurocognitive Disorder Mild neurocognitive disorder, formerly known as mild cognitive impairment, is a disorder in which memory does not work as well as it should. This disorder may also cause problems with other mental functions, including thought, communication, behavior, and completion of tasks. These problems can be noticed and measured, but they usually do not interfere with daily activities or the ability to live independently. Mild neurocognitive disorder typically develops after 75 years of age, but it can also develop at younger ages. It is not as serious as major neurocognitive disorder, formerly known as dementia, but it may be the first sign of it. Generally, symptoms of this condition get worse over time. In rare cases, symptoms can get better. What are the causes? This condition may be caused by:  Brain disorders like Alzheimer's disease, Parkinson's disease, and other conditions that gradually damage nerve cells (neurodegenerative conditions).  Diseases that affect blood vessels in the brain and result in small strokes.  Certain infections, such as HIV.  Traumatic brain injury.  Other medical conditions, such as brain tumors, underactive thyroid (hypothyroidism), and vitamin B12 deficiency.  Use of certain drugs or prescription medicines. What increases the risk? The following factors may make you more likely to develop this condition:  Being older than 48.  Being female.  Low education level.  Diabetes, high blood pressure, high cholesterol, and other conditions that increase the risk for blood vessel diseases.  Untreated or undertreated sleep apnea.  Having a certain type of gene that can be passed from parent to child (inherited).  Chronic health problems such as heart disease, lung disease, liver disease, kidney disease, or depression. What are the signs or symptoms? Symptoms of this condition include:  Difficulty  remembering. You may: ? Forget names, phone numbers, or details of recent events. ? Forget social events and appointments. ? Repeatedly forget where you put your car keys or other items.  Difficulty thinking and solving problems. You may have trouble with complex tasks, such as: ? Paying bills. ? Driving in unfamiliar places.  Difficulty communicating. You may have trouble: ? Finding the right word or naming an object. ? Forming a sentence that makes sense, or understanding what you read or hear.  Changes in your behavior or personality. When this happens, you may: ? Lose interest in the things that you used to enjoy. ? Withdraw from social situations. ? Get angry more easily than usual. ? Act before thinking. How is this diagnosed? This condition is diagnosed based on:  Your symptoms. Your health care provider may ask you and the people you spend time with, such as family and friends, about your symptoms.  Evaluation of mental functions (neuropsychological testing). Your health care provider may refer you to a neurologist or mental health specialist to evaluate your mental functions in detail. To identify the cause of your condition, your health care provider may:  Get a detailed medical history.  Ask about use of alcohol, drugs, and prescription medicines.  Do a physical exam.  Order blood tests and brain imaging exams. How is this treated? Mild neurocognitive disorder that is caused by medicine use, drug use, infection, or another medical condition may improve when the cause is treated, or when medicines or drugs are stopped. If this disorder has another cause, it generally does not improve and may get worse. In these cases, the goal of treatment is to help you manage the loss of mental function. Treatments in these  cases include:  Medicine. Medicine mainly helps memory and behavior symptoms.  Talk therapy. Talk therapy provides education, emotional support, memory aids, and  other ways of making up for problems with mental function.  Lifestyle changes, including: ? Getting regular exercise. ? Eating a healthy diet that includes omega-3 fatty acids. ? Challenging your thinking and memory skills. ? Having more social interaction. Follow these instructions at home: Eating and drinking   Drink enough fluid to keep your urine pale yellow.  Eat a healthy diet that includes omega-3 fatty acids. These can be found in: ? Fish. ? Nuts. ? Leafy vegetables. ? Vegetable oils.  If you drink alcohol: ? Limit how much you use to:  0-1 drink a day for women.  0-2 drinks a day for men. ? Be aware of how much alcohol is in your drink. In the [place] one drink equals one 12 oz bottle of beer (355 mL), one 5 oz glass of wine (148 mL), or one 1 oz glass of hard liquor (44 mL). Lifestyle   Get regular exercise as told by your health care provider.  Do not use any products that contain nicotine or tobacco, such as cigarettes, e-cigarettes, and chewing tobacco. If you need help quitting, ask your health care provider.  Practice ways to manage stress. If you need help managing stress, ask your health care provider.  Continue to have social interaction.  Keep your mind active with stimulating activities you enjoy, such as reading or playing games.  Make sure to get quality sleep. Follow these tips: ? Avoid napping during the day. ? Keep your sleeping area dark and cool. ? Avoid exercising during the few hours before you go to bed. ? Avoid caffeine products in the evening. General instructions  Take over-the-counter and prescription medicines only as told by your health care provider. Your health care provider may recommend that you avoid taking medicines that can affect thinking, such as pain medicines or sleep medicines.  Work with your health care provider to find out what you need help with and what your safety needs are.  Keep all follow-up visits as told by  your health care provider. This is important. Where to find more information  [employer] on Aging: http://kim-miller.com/ Contact a health care provider if:  You have any new symptoms. Get help right away if:  You develop new confusion or your confusion gets worse.  You act in ways that place you or your family in danger. Summary  Mild neurocognitive disorder is a disorder in which memory does not work as well as it should.  Mild neurocognitive disorder can have many causes. It may be the first stage of dementia.  To manage your condition, get regular exercise, keep your mind active, get quality sleep, and eat a healthy diet. This information is not intended to replace advice given to you by your health care provider. Make sure you discuss any questions you have with your health care provider. Document Revised: 11/07/2018 Document Reviewed: 11/07/2018 Elsevier Patient Education  [place].   Dementia Dementia is a condition that affects the way the brain functions. It often affects memory and thinking. Usually, dementia gets worse with time and cannot be reversed (progressive dementia). There are many types of dementia, including:  Alzheimer's disease. This type is the most common.  Vascular dementia. This type may happen as the result of a stroke.  Lewy body dementia. This type may happen to people who have Parkinson's disease.  Frontotemporal  dementia. This type is caused by damage to nerve cells (neurons) in certain parts of the brain. Some people may be affected by more than one type of dementia. This is called mixed dementia. What are the causes? Dementia is caused by damage to cells in the brain. The area of the brain and the types of cells damaged determine the type of dementia. Usually, this damage is irreversible or cannot be undone. Some examples of irreversible causes include:  Conditions that affect the blood vessels of the brain, such as diabetes, heart  disease, or blood vessel disease.  Genetic mutations. In some cases, changes in the brain may be caused by another condition and can be reversed or slowed. Some examples of reversible causes include:  Injury to the brain.  Certain medicines.  Infection, such as meningitis.  Metabolic problems, such as vitamin B12 deficiency or thyroid disease.  Pressure on the brain, such as from a tumor or blood clot. What are the signs or symptoms? Symptoms of dementia depend on the type of dementia. Common signs of dementia include problems with remembering, thinking, problem solving, decision making, and communicating. These signs develop slowly or get worse with time. This may include:  Problems remembering things.  Having trouble taking a bath or putting clothes on.  Forgetting appointments.  Forgetting to pay bills.  Difficulty planning and preparing meals.  Having trouble speaking.  Getting lost easily. How is this diagnosed? This condition is diagnosed by a specialist (neurologist). It is diagnosed based on the history of your symptoms, your medical history, a physical exam, and tests. Tests may include:  Tests to evaluate brain function, such as memory tests, cognitive tests, and other tests.  Lab tests, such as blood or urine tests.  Imaging tests, such as a CT scan, a PET scan, or an MRI.  Genetic testing. This may be done if other family members have a diagnosis of certain types of dementia. Your health care provider will talk with you and your family, friends, or caregivers about your history and symptoms. How is this treated?  Treatment for this condition depends on the cause of the dementia. Progressive dementias, such as Alzheimer's disease, cannot be cured, but there may be treatments that help to manage symptoms. Treatment might involve taking medicines that may help to:  Control the dementia.  Slow down the progression of the dementia.  Manage symptoms. In some  cases, treating the cause of your dementia can improve symptoms, reverse symptoms, or slow down how quickly your dementia becomes worse. Your health care provider can direct you to support groups, organizations, and other health care providers who can help with decisions about your care. Follow these instructions at home: Medicines  Take over-the-counter and prescription medicines only as told by your health care provider.  Use a pill organizer or pill reminder to help you manage your medicines.  Avoid taking medicines that can affect thinking, such as pain medicines or sleeping medicines. Lifestyle  Make healthy lifestyle choices. ? Be physically active as told by your health care provider. ? Do not use any products that contain nicotine or tobacco, such as cigarettes, e-cigarettes, and chewing tobacco. If you need help quitting, ask your health care provider. ? Do not drink alcohol. ? Practice stress-management techniques when you get stressed. ? Spend time with other people.  Make sure to get quality sleep. These tips can help you get a good night's rest: ? Avoid napping during the day. ? Keep your sleeping area dark  and cool. ? Avoid exercising during the few hours before you go to bed. ? Avoid caffeine products in the evening. Eating and drinking  Drink enough fluid to keep your urine pale yellow.  Eat a healthy diet. General instructions   Work with your health care provider to determine what you need help with and what your safety needs are.  Talk with your health care provider about whether it is safe for you to drive.  If you were given a bracelet that identifies you as a person with memory loss or tracks your location, make sure to wear it at all times.  Work with your family to make important decisions, such as advance directives, medical power of attorney, or a living will.  Keep all follow-up visits as told by your health care provider. This is important. Where  to find more information  Alzheimer's Association: CapitalMile.co.nz  National Institute on Aging: DVDEnthusiasts.nl  World Health Organization: RoleLink.com.br Contact a health care provider if:  You have any new or worsening symptoms.  You have problems with choking or swallowing. Get help right away if:  You feel depressed or sad, or feel that you want to harm yourself.  Your family members become concerned for your safety. If you ever feel like you may hurt yourself or others, or have thoughts about taking your own life, get help right away. You can go to your nearest emergency department or call:  Your local emergency services (911 in the [place]  A suicide crisis helpline, such as the [place] at 870-209-0873. This is open 24 hours a day. Summary  Dementia is a condition that affects the way the brain functions. Dementia often affects memory and thinking.  Usually, dementia gets worse with time and cannot be reversed (progressive dementia).  Treatment for this condition depends on the cause of the dementia.  Work with your health care provider to determine what you need help with and what your safety needs are.  Your health care provider can direct you to support groups, organizations, and other health care providers who can help with decisions about your care. This information is not intended to replace advice given to you by your health care provider. Make sure you discuss any questions you have with your health care provider. Document Revised: 06/21/2018 Document Reviewed: 06/21/2018 Elsevier Patient Education  [place].  Memantine Tablets What is this medicine? MEMANTINE (MEM an teen) is used to treat dementia caused by Alzheimer's disease. This medicine may be used for other purposes; ask your health care provider or pharmacist if you have questions. COMMON BRAND NAME(S): Namenda What should I tell my health care provider  before I take this medicine? They need to know if you have any of these conditions:  difficulty passing urine  kidney disease  liver disease  seizures  an unusual or allergic reaction to memantine, other medicines, foods, dyes, or preservatives  pregnant or trying to get pregnant  breast-feeding How should I use this medicine? Take this medicine by mouth with a glass of water. Follow the directions on the prescription label. You may take this medicine with or without food. Take your doses at regular intervals. Do not take your medicine more often than directed. Continue to take your medicine even if you feel better. Do not stop taking except on the advice of your doctor or health care professional. Talk to your pediatrician regarding the use of this medicine in children. Special care may be needed. Overdosage:  If you think you have taken too much of this medicine contact a poison control center or emergency room at once. NOTE: This medicine is only for you. Do not share this medicine with others. What if I miss a dose? If you miss a dose, take it as soon as you can. If it is almost time for your next dose, take only that dose. Do not take double or extra doses. If you do not take your medicine for several days, contact your health care provider. Your dose may need to be changed. What may interact with this medicine?  acetazolamide  amantadine  cimetidine  dextromethorphan  dofetilide  hydrochlorothiazide  ketamine  metformin  methazolamide  quinidine  ranitidine  sodium bicarbonate  triamterene This list may not describe all possible interactions. Give your health care provider a list of all the medicines, herbs, non-prescription drugs, or dietary supplements you use. Also tell them if you smoke, drink alcohol, or use illegal drugs. Some items may interact with your medicine. What should I watch for while using this medicine? Visit your doctor or health care  professional for regular checks on your progress. Check with your doctor or health care professional if there is no improvement in your symptoms or if they get worse. You may get drowsy or dizzy. Do not drive, use machinery, or do anything that needs mental alertness until you know how this drug affects you. Do not stand or sit up quickly, especially if you are an older patient. This reduces the risk of dizzy or fainting spells. Alcohol can make you more drowsy and dizzy. Avoid alcoholic drinks. What side effects may I notice from receiving this medicine? Side effects that you should report to your doctor or health care professional as soon as possible:  allergic reactions like skin rash, itching or hives, swelling of the face, lips, or tongue  agitation or a feeling of restlessness  depressed mood  dizziness  hallucinations  redness, blistering, peeling or loosening of the skin, including inside the mouth  seizures  vomiting Side effects that usually do not require medical attention (report to your doctor or health care professional if they continue or are bothersome):  constipation  diarrhea  headache  nausea  trouble sleeping This list may not describe all possible side effects. Call your doctor for medical advice about side effects. You may report side effects to FDA at 1-800-FDA-1088. Where should I keep my medicine? Keep out of the reach of children. Store at room temperature between 15 degrees and 30 degrees C (59 degrees and 86 degrees F). Throw away any unused medicine after the expiration date. NOTE: This sheet is a summary. It may not cover all possible information. If you have questions about this medicine, talk to your doctor, pharmacist, or health care provider.  2020 Elsevier/Gold Standard (2013-01-23 14:10:42)

## 2020-02-19 ENCOUNTER — Encounter: Payer: Self-pay | Admitting: Neurology

## 2020-02-19 ENCOUNTER — Telehealth: Payer: Self-pay | Admitting: *Deleted

## 2020-02-19 DIAGNOSIS — R4189 Other symptoms and signs involving cognitive functions and awareness: Secondary | ICD-10-CM | POA: Insufficient documentation

## 2020-02-19 NOTE — Telephone Encounter (Signed)
-----   Message from Melvenia Beam, MD sent at 02/18/2020  7:57 PM EDT ----- Romelle Starcher, Patient's TSH is abnormal (TSH 7). I recommend she follow up with her primary care doctor for evaluation of hypothyroidism. Please let her family know, and I will send an email to Dr. Dema Severin, I'm not sure she is still patient's pcp)

## 2020-02-19 NOTE — Telephone Encounter (Signed)
I tried to reach the pt's sister Arbie Cookey (on [place]). The phone rang multiple times  and never went to voicemail option. I will try again later.

## 2020-02-20 ENCOUNTER — Telehealth: Payer: Self-pay | Admitting: Neurology

## 2020-02-20 NOTE — Telephone Encounter (Signed)
Medicare/bcbs supp order sent to [facility]. No auth they will reach out to the patient to schedule.  

## 2020-02-21 NOTE — Telephone Encounter (Signed)
[employer] and obtained fax number to send lab results to [phone]) attn: [facility]. Also tried to call pt's sister again, Arbie Cookey (on [place]) but phone rang multiple times and never went to voicemail. Will try again tomorrow.

## 2020-02-22 LAB — METHYLMALONIC ACID, SERUM: Methylmalonic Acid: 192 nmol/L (ref 0–378)

## 2020-02-22 LAB — B12 AND FOLATE PANEL
Folate: >20 ng/mL (ref >3.0)
Vitamin B-12: 1285 pg/mL — ABNORMAL HIGH (ref 232–1245)

## 2020-02-22 LAB — HOMOCYSTEINE: Homocysteine: 10.9 umol/L (ref 0.0–19.2)

## 2020-02-22 LAB — TSH: TSH: 7.06 u[IU]/mL — ABNORMAL HIGH (ref 0.450–4.500)

## 2020-02-22 LAB — VITAMIN B1: Thiamine: 238.3 nmol/L — ABNORMAL HIGH (ref 66.5–200.0)

## 2020-02-22 NOTE — Telephone Encounter (Addendum)
I tried to reach the sister again but the phone rang multiple times well over 1 minute and then there was a busy signal. I faxed the results to Dr Dema Severin. Received a receipt of confirmation.

## 2020-02-22 NOTE — Telephone Encounter (Signed)
I tried to fax the lab results to [facility] again but it will not go through. I am getting a message stating the voicemail box is not connected and the number is not accepting messages. I called [employer] at [phone] and spoke with Jeannine. She will let [facility] [employer] know and see if she can call us and provide an alternative fax number we can use.   When she calls back, please take a message with the alternative fax number from [place] so we can send her the lab results and also let her know we have been trying to reach the sister with the results but have not heard back yet. The patient will need to follow up with primary care due to the TSH being high.

## 2020-02-26 NOTE — Telephone Encounter (Signed)
The fax number for [facility] still isn't accepting faxes. I have called their office and LVM asking for call back with alternative fax number. I was able to reach the pt's sister Arbie Cookey and we discussed the lab results showing TSH 7, needs evaluation for hypothyroidism by PCP. Arbie Cookey did confirm that the pt still sees Dr Dema Severin and I let her know I had already faxed the results to her office. Carol verbalized appreciation and stated she would get in touch with them so [facility] and Dr Orest Dikes office could coordinate an appointment time.

## 2020-02-27 NOTE — Telephone Encounter (Signed)
I called [employer] and spoke with Jeanine who provided me an alternative fax number of [phone]. I faxed the labs to this number. Received a receipt of confirmation.

## 2020-02-29 ENCOUNTER — Other Ambulatory Visit: Payer: Self-pay

## 2020-02-29 ENCOUNTER — Ambulatory Visit: Payer: Medicare Other | Attending: Family Medicine | Admitting: Physical Therapy

## 2020-02-29 ENCOUNTER — Encounter: Payer: Self-pay | Admitting: Physical Therapy

## 2020-02-29 VITALS — BP 160/75 | HR 60

## 2020-02-29 DIAGNOSIS — R296 Repeated falls: Secondary | ICD-10-CM

## 2020-02-29 DIAGNOSIS — M6281 Muscle weakness (generalized): Secondary | ICD-10-CM

## 2020-02-29 DIAGNOSIS — R2681 Unsteadiness on feet: Secondary | ICD-10-CM

## 2020-02-29 DIAGNOSIS — R2689 Other abnormalities of gait and mobility: Secondary | ICD-10-CM

## 2020-02-29 NOTE — Therapy (Signed)
[facility] [address] [place], [place], [address] Phone: [phone]   Fax:  [phone]  Physical Therapy Evaluation  Patient Details  Name: Alexandra Lamb MRN: [id number] Date of Birth: [birth year] Referring Provider (PT): Dr. Harlan Stains, MD   Encounter Date: 02/29/2020     02/29/20 1412  PT Visits / Re-Eval  Visit Number 1  Number of Visits 17  Date for PT Re-Evaluation 04/29/20  Authorization  Authorization Type Medicare and BCBS Supplement; 10th visit PN  PT Time Calculation  PT Start Time 1024  PT Stop Time 1107  PT Time Calculation (min) 43 min  PT - End of Session  Equipment Utilized During Treatment Gait belt  Activity Tolerance Patient tolerated treatment well  Behavior During Therapy Impulsive    Past Medical History:  Diagnosis Date  . Cancer ([place])    left breast  . Diabetes mellitus   . Hyperlipidemia   . Hypertension   . MCI (mild cognitive impairment)   . Schizophrenia ([place])    paranoid    Past Surgical History:  Procedure Laterality Date  . BREAST SURGERY  2011   left breast mast  . MASTECTOMY     left  . TOOTH EXTRACTION      Vitals:   02/29/20 1354  BP: (!) 160/75  Pulse: 60  SpO2: 97%        02/29/20 1028  Symptoms/Limitations  Subjective Pt is a 74 y/o female with paranoid schizophrenia who currently resides in a group home locally called [facility]. Pt is experiencing multiple falls, a decline in function including decreased gait speed, decreased functional mobility, and decreased independence with daily acitivities. Pt is sedentary and sister reports she has become deconditioned. Pt received HHPT a couple of months ago in which she improved on TUG test and pt reports was helpful.  Patient is accompained by: Family member (Sister)  Pertinent History PMH: paranoid schizophrenia, diabetes type II, recent HgbA1c approx 8, COPD, HTN, HLD, obesity, mild cognitive  impairment, Breast CA (L) and masectomy, vit D deficiency  Limitations Standing;Walking;Lifting;House hold activities  Pain Assessment  Currently in Pain? No/denies         02/29/20 1035  Assessment  Medical Diagnosis Recurring Falls  Referring Provider (PT) Dr. Harlan Stains, MD  Onset Date/Surgical Date 02/29/20  Hand Dominance Right  Prior Therapy HHPT, [facility] [address]  Precautions  Precautions Fall;Other (comment)  Precaution Comments LUE restricted for BP; paranoid schizophrenia, diabetes type II, recent HgbA1c approx 8, COPD, HTN, HLD, obesity, mild cognitive impairment, Breast CA (L) and masectomy, vit D deficiency  Restrictions  Weight Bearing Restrictions No  Balance Screen  Has the patient fallen in the past 6 months Yes  How many times? 3  Has the patient had a decrease in activity level because of a fear of falling?  Yes  Is the patient reluctant to leave their home because of a fear of falling?  Yes  [facility]  Additional Comments [facility]; Pt's home is on second floor of group home. Pt had fall on stairs at [facility]. Group home staff is worried about pt falling on stairs.  Prior Function  Level of Independence Independent with basic ADLs;Needs assistance with gait  Vocation On disability;Unemployed  Leisure Was going to [employer] for exercise, Zumba, etc with group home; no exercise since pandemic; Church, reading  Cognition  Overall Cognitive Status History of cognitive impairments - at baseline  Behaviors Impulsive  Observation/Other Assessments  Focus on Therapeutic Outcomes (FOTO)  Not Assessed  Sensation  Light Touch Appears Intact  Coordination  Gross Motor Movements are Fluid and Coordinated No  Fine Motor Movements are Fluid and Coordinated No  Finger Nose Finger Test Slow and hard to reach finger, nose target is off.   Heel Shin Test Unable to complete  ROM / Strength  AROM / PROM / Strength  Strength  Strength  Overall Strength Deficits  Overall Strength Comments Pt is 3/5 grossly in BLE.  Strength Assessment Site Hip;Knee;Ankle  Right/Left Hip Right;Left  Right/Left Knee Right;Left  Right/Left Ankle Right;Left  Right Hip Flexion 3/5  Left Hip Flexion 3-/5  Right Knee Flexion 3/5  Right Knee Extension 3/5  Left Knee Flexion 3/5  Left Knee Extension 3/5  Right Ankle Dorsiflexion 3+/5  Right Ankle Plantar Flexion 3+/5  Left Ankle Dorsiflexion 3/5  Left Ankle Plantar Flexion 3/5  Transfers  Transfers Sit to Stand;Stand to Sit  Sit to Stand 4: Min guard  Five time sit to stand comments  14.87 without hands from standard arm chair. 12.4 s with UE support.  Stand to Sit 4: Min guard  Transfer Cueing Pt cued on anterior scoot and anterior weight shift.  Ambulation/Gait  Ambulation/Gait Yes  Ambulation/Gait Assistance 4: Min guard  Ambulation Distance (Feet) 100 Feet  Assistive device Other (Comment) (Walking stick)  Gait Pattern Decreased step length - right;Decreased stance time - left;Shuffle;Poor foot clearance - left;Poor foot clearance - right;Step-through pattern  Ambulation Surface Level;Indoor  Stairs Yes  Stairs Assistance 4: Min guard;4: Min assist  Stairs Assistance Details (indicate cue type and reason) Pt cued to do what feels natural to assess how she is doing stairs at group home upone evaluation. Pt demos alternating pattern going up and step-to pattern going up.   Stair Management Technique Alternating pattern;Two rails;Step to pattern  Number of Stairs 4  Height of Stairs 6  Gait Comments Patient only has one rail to hold at group home; sister believes pt is turning sideways and is using both hands on one rail to prevent a fall  Balance  Balance Assessed Yes  Standardized Balance Assessment  Standardized Balance Assessment Berg Balance Test  Berg Balance Test  Sit to Stand 3  Standing Unsupported 3  Sitting with Back Unsupported but Feet Supported  on Floor or Stool 4  Stand to Sit 4  Transfers 4  Standing Unsupported with Eyes Closed 4  Standing Unsupported with Feet Together 1  From Standing, Reach Forward with Outstretched Arm 2  From Standing Position, Pick up Object from Floor 3  From Standing Position, Turn to Look Behind Over each Shoulder 2  Turn 360 Degrees 1  Standing Unsupported, Alternately Place Feet on Step/Stool 0  Standing Unsupported, One Foot in Front 2  Standing on One Leg 0  Total Score 33  Berg comment: Pt with greatest dificulity with alternating toe taps, SLS, tandem, and narrow BOS                Objective measurements completed on examination: See above findings.           02/29/20 1411  PT Education  Education Details Pt educated on baseline assessment and POC as well as purpose of PT and how our services will be beneficial to pt, aquatic therapy; importance of having someone supervise performance of HEP and importance of community wellness to maintain gains made in therapy.  Exercise log for HEP  Person(s) Educated Patient;Other (comment) (Sister)  Methods Explanation;Demonstration  Comprehension Verbalized understanding           PT Short Term Goals - 02/29/20 1439      PT SHORT TERM GOAL #1   Title Patient will reduce Five time Sit to Stand time by 3 seconds without hands from standard surface height in order to decrease risk for falls.    Baseline 14.87 s w/o hands from standard arm chair.    Time 4    Period Weeks    Status New    Target Date 03/30/20      PT SHORT TERM GOAL #2   Title Patient will improve BERG balance score by 5 points for CDC and improvement in static and dynamic balance.    Baseline 33/56 02/29/20    Time 4    Period Weeks    Status New    Target Date 03/30/20      PT SHORT TERM GOAL #3   Title Patient will trial use of a straight cane vs RW for >300' using supervision assist.    Baseline 100' with walking stick and CGA    Time 4     Period Weeks    Status New    Target Date 03/30/20      PT SHORT TERM GOAL #4   Title Pt will demonstrate ability to perform initial HEP with supervision and staff will indicate that pt has performed atleast 3 days/week    Time 4    Period Weeks    Status New    Target Date 03/30/20      PT SHORT TERM GOAL #5   Title Pt will participate in assessment of falls risk during gait with DGI    Time 4    Period Weeks    Status New    Target Date 03/30/20             PT Long Term Goals - 02/29/20 1448      PT LONG TERM GOAL #1   Title Pt will increase Berg score to >/= 41/56 for decreased fall risk.    Baseline 33/56 02/29/20    Time 8    Period Weeks    Status New    Target Date 04/29/20      PT LONG TERM GOAL #2   Title Patient will increase DGI score by 4 points for significant change in dynamic balance    Baseline NT    Time 8    Period Weeks    Status New    Target Date 04/29/20      PT LONG TERM GOAL #3   Title Pt will ambulate 800+ feet with LRAD at mod-I for increased independence with functional mobility,    Baseline 100' walking stick with CGA    Time 8    Period Weeks    Status New    Target Date 04/30/19      PT LONG TERM GOAL #4   Title Pt will negotiate 12 stairs with one rails and LRAD safely with supervision    Time 8    Period Weeks    Status New    Target Date 04/29/20      PT LONG TERM GOAL #5   Title Pt will be able to perform final HEP with staff's supervision and will have re-started community exercise/wellness with group home    Time 8    Period Weeks    Status New    Target  Date 04/29/20                    02/29/20 1413  Plan  Clinical Impression Statement Pt is a 74 y/o female with paranoid schizophrenia who currently resides in a group home locally called [facility]. Pt is experiencing decline in function including decreased gait speed, decreased functional mobility, and decreased independence with daily acitivities.  Pt is sedentary and sister reports she has become deconditioned. Pt received HHPT a couple of months ago in which she improved on TUG test and pt reports was helpful. Pt presents today with BLE weakness, gait impairments, dynamic balance deficits and poor safety awareness.  Patient is at increased falls risk as indicated by BERG score of 33/56.  Pt will benefit from skilled PT services to address these impairments and functional limitations to maximize functional mobility independence and decrease falls risk.  Personal Factors and Comorbidities Comorbidity 3+;Age;Behavior Pattern;Social Background;Time since onset of injury/illness/exacerbation;Fitness  Comorbidities paranoid schizophrenia, diabetes type II, recent HgbA1c approx 8, COPD, HTN, HLD, obesity, mild cognitive impairment, Breast CA (L) and masectomy, vit D deficiency  Examination-Activity Limitations Stairs;Stand;Transfers;Locomotion Level;Bend;Dressing  Examination-Participation Restrictions Church;Community Activity  Pt will benefit from skilled therapeutic intervention in order to improve on the following deficits Abnormal gait;Decreased coordination;Decreased activity tolerance;Decreased cognition;Decreased mobility;Decreased strength;Decreased knowledge of use of DME;Decreased balance;Decreased safety awareness;Difficulty walking;Decreased range of motion;Decreased endurance  Stability/Clinical Decision Making Evolving/Moderate complexity  Clinical Decision Making Moderate  Rehab Potential Good  PT Frequency 2x / week  PT Duration 8 weeks  PT Treatment/Interventions ADLs/Self Care Home Management;Stair training;Gait training;Functional mobility training;Therapeutic activities;Balance training;DME Instruction;Therapeutic exercise;Patient/family education;Neuromuscular re-education;Orthotic Fit/Training;Aquatic Therapy;Cognitive remediation  PT Next Visit Plan LUE RESTRICTED FOR BP.  Assess Hip ABD and Ext strength, Assess sit-to-stand from  lower, more compliant surface, DGI potentially, initiate HEP (who will be helping her with HEP?); assess for AD?  PT Home Exercise Plan Safe strengthening and balance  Recommended Other Services Speech for cognition?  Consulted and Agree with Plan of Care Patient;Other (Comment) (Sister)    Patient will benefit from skilled therapeutic intervention in order to improve the following deficits and impairments:  Abnormal gait, Decreased coordination, Decreased activity tolerance, Decreased cognition, Decreased mobility, Decreased strength, Decreased knowledge of use of DME, Decreased balance, Decreased safety awareness, Difficulty walking, Decreased range of motion, Decreased endurance  Visit Diagnosis: Repeated falls  Other abnormalities of gait and mobility  Unsteadiness on feet  Muscle weakness (generalized)     Problem List Patient Active Problem List   Diagnosis Date Noted  . Cognitive impairment 02/19/2020  . Breast cancer of upper-outer quadrant of left female breast ([place]) 05/14/2011  . Vitamin D deficiency 04/02/2011  . DIASTOLIC HEART FAILURE, CHRONIC 07/10/2009  . DIABETES MELLITUS, TYPE II 06/18/2009  . HYPERLIPIDEMIA 06/18/2009  . SCHIZOPHRENIA 06/18/2009  . HYPERTENSION 06/18/2009  . MI 06/18/2009  . RESPIRATORY FAILURE, ACUTE 06/18/2009  . DYSPNEA ON EXERTION 06/18/2009    Rosalita Levan, SPT 02/29/2020, 2:50 PM  [facility] [address] [place], [place], [address] Phone: [phone]   Fax:  [phone]  Name: Alexandra Lamb MRN: [id number] Date of Birth: [birth year]

## 2020-03-01 ENCOUNTER — Other Ambulatory Visit: Payer: Self-pay | Admitting: Hematology and Oncology

## 2020-03-05 ENCOUNTER — Ambulatory Visit: Payer: Medicare Other | Admitting: Physical Therapy

## 2020-03-05 ENCOUNTER — Encounter: Payer: Self-pay | Admitting: Physical Therapy

## 2020-03-05 ENCOUNTER — Other Ambulatory Visit: Payer: Self-pay

## 2020-03-05 DIAGNOSIS — R296 Repeated falls: Secondary | ICD-10-CM | POA: Diagnosis not present

## 2020-03-05 DIAGNOSIS — R2689 Other abnormalities of gait and mobility: Secondary | ICD-10-CM

## 2020-03-05 DIAGNOSIS — R2681 Unsteadiness on feet: Secondary | ICD-10-CM | POA: Diagnosis not present

## 2020-03-05 DIAGNOSIS — M6281 Muscle weakness (generalized): Secondary | ICD-10-CM | POA: Diagnosis not present

## 2020-03-05 NOTE — Patient Instructions (Signed)
Hip Extension (Standing)    Stand with support. Squeeze pelvic floor and hold. Move right leg backward with straight knee. Hold for _3__ seconds. Relax for _3__ seconds. Repeat _10__ times. Do __1_ times a day.      Hip Abduction (Standing)    Stand with support. Squeeze pelvic floor and hold. Lift right leg out to side, keeping toe forward. Hold for _3__ seconds. Relax for 3___ seconds.  Repeat _10__ times. Do __1_ times a day.    Copyright  VHI. All rights reserved.  Hip Flexion (Standing) - MARCHING IN PLACE    Stand with support. Squeeze pelvic floor and hold. Lift right knee upward. Hold for _2__ seconds. Relax for _3__ seconds. Repeat _10__ times. Do _1__ times a day.    Copyright  VHI. All rights reserved.  SIT TO STAND: No Device    Sit with feet shoulder-width apart, on floor. Lean chest forward, raise hips up from surface. Straighten hips and knees. Weight bear equally on left and right sides. _10__ reps per set, _1__ sets per day, _5 days per week Place left leg closer to sitting surface.  Copyright  VHI. All rights reserved.

## 2020-03-06 NOTE — Therapy (Signed)
[facility] [address] [place], [place], [address] Phone: [phone]   Fax:  [phone]  Physical Therapy Treatment  Patient Details  Name: Alexandra Lamb MRN: [id number] Date of Birth: September 25, 1945 Referring Provider (PT): Dr. Harlan Stains, MD   Encounter Date: 03/05/2020   PT End of Session - 03/06/20 1850    Visit Number 2    Number of Visits 17    Date for PT Re-Evaluation 04/29/20    Authorization Type Medicare and BCBS Supplement; 10th visit PN    PT Start Time 1017    PT Stop Time 1105    PT Time Calculation (min) 48 min    Equipment Utilized During Treatment Gait belt    Activity Tolerance Patient tolerated treatment well    Behavior During Therapy WFL for tasks assessed/performed           Past Medical History:  Diagnosis Date  . Cancer ([facility])    left breast  . Diabetes mellitus   . Hyperlipidemia   . Hypertension   . MCI (mild cognitive impairment)   . Schizophrenia ([facility])    paranoid    Past Surgical History:  Procedure Laterality Date  . BREAST SURGERY  2011   left breast mast  . MASTECTOMY     left  . TOOTH EXTRACTION      There were no vitals filed for this visit.              Manual muscle test grades:  Rt and Lt hip abductors 3+/5 Rt and Lt hip extensors 3+/5      [facility] Adult PT Treatment/Exercise - 03/06/20 0001      Transfers   Transfers Sit to Stand;Stand to Sit    Sit to Stand 4: Min guard    Stand to Sit 4: Min guard    Number of Reps Other reps (comment)   5 reps     Ambulation/Gait   Ambulation/Gait Yes    Ambulation/Gait Assistance 4: Min guard    Ambulation Distance (Feet) 230 Feet    Assistive device None    Gait Pattern Step-through pattern;Decreased step length - right;Decreased step length - left;Decreased arm swing - right;Decreased arm swing - left   foot flat contact   Ambulation Surface Level;Indoor    Stairs Yes    Stairs Assistance 4:  Min guard    Stairs Assistance Details (indicate cue type and reason) cues to place toes over edge with descension    Stair Management Technique Alternating pattern;Step to pattern;Forwards;Two rails    Number of Stairs 4    Height of Stairs 6      Exercises   Exercises Knee/Hip      Knee/Hip Exercises: Aerobic   Recumbent Bike SciFit level 2.0 x 5" with UE's & LE's           Neuro Re-ed:  Pt performed standing alternating hip flexion, extension, abdct. And marching 10 reps each at counter with Bil. UE support for safety - issued these exs. For HEP       PT Education - 03/06/20 1849    Education Details HEP initiated    [employer]) Educated Patient;Caregiver(s)    Methods Explanation;Demonstration;Handout    Comprehension Verbalized understanding;Returned demonstration            PT Short Term Goals - 03/06/20 1854      PT SHORT TERM GOAL #1   Title Patient will reduce Five time Sit to Stand time by  3 seconds without hands from standard surface height in order to decrease risk for falls.    Baseline 14.87 s w/o hands from standard arm chair.    Time 4    Period Weeks    Status New    Target Date 03/30/20      PT SHORT TERM GOAL #2   Title Patient will improve BERG balance score by 5 points for CDC and improvement in static and dynamic balance.    Baseline 33/56 02/29/20    Time 4    Period Weeks    Status New    Target Date 03/30/20      PT SHORT TERM GOAL #3   Title Patient will trial use of a straight cane vs RW for >300' using supervision assist.    Baseline 100' with walking stick and CGA    Time 4    Period Weeks    Status New    Target Date 03/30/20      PT SHORT TERM GOAL #4   Title Pt will demonstrate ability to perform initial HEP with supervision and staff will indicate that pt has performed atleast 3 days/week    Time 4    Period Weeks    Status New    Target Date 03/30/20      PT SHORT TERM GOAL #5   Title Pt will participate in assessment  of falls risk during gait with DGI    Time 4    Period Weeks    Status New    Target Date 03/30/20             PT Long Term Goals - 03/06/20 1854      PT LONG TERM GOAL #1   Title Pt will increase Berg score to >/= 41/56 for decreased fall risk.    Baseline 33/56 02/29/20    Time 8    Period Weeks    Status New      PT LONG TERM GOAL #2   Title Patient will increase DGI score by 4 points for significant change in dynamic balance    Baseline NT    Time 8    Period Weeks    Status New      PT LONG TERM GOAL #3   Title Pt will ambulate 800+ feet with LRAD at mod-I for increased independence with functional mobility,    Baseline 100' walking stick with CGA    Time 8    Period Weeks    Status New      PT LONG TERM GOAL #4   Title Pt will negotiate 12 stairs with one rails and LRAD safely with supervision    Time 8    Period Weeks    Status New      PT LONG TERM GOAL #5   Title Pt will be able to perform final HEP with staff's supervision and will have re-started community exercise/wellness with group home    Time 8    Period Weeks    Status New                  Patient will benefit from skilled therapeutic intervention in order to improve the following deficits and impairments:  Abnormal gait, Decreased coordination, Decreased activity tolerance, Decreased cognition, Decreased mobility, Decreased strength, Decreased knowledge of use of DME, Decreased balance, Decreased safety awareness, Difficulty walking, Decreased range of motion, Decreased endurance  Visit Diagnosis: Other abnormalities of gait and mobility  Unsteadiness on feet  Muscle weakness (generalized)     Problem List Patient Active Problem List   Diagnosis Date Noted  . Cognitive impairment 02/19/2020  . Breast cancer of upper-outer quadrant of left female breast ([facility]) 05/14/2011  . Vitamin D deficiency 04/02/2011  . DIASTOLIC HEART FAILURE, CHRONIC 07/10/2009  . DIABETES MELLITUS,  TYPE II 06/18/2009  . HYPERLIPIDEMIA 06/18/2009  . SCHIZOPHRENIA 06/18/2009  . HYPERTENSION 06/18/2009  . MI 06/18/2009  . RESPIRATORY FAILURE, ACUTE 06/18/2009  . DYSPNEA ON EXERTION 06/18/2009    Alda Lea, PT 03/06/2020, 6:55 PM  [facility] [address] [place], [place], [address] Phone: [phone]   Fax:  [phone]  Name: Alexandra Lamb MRN: [id number] Date of Birth: 05-07-1945

## 2020-03-07 ENCOUNTER — Other Ambulatory Visit: Payer: Self-pay

## 2020-03-07 ENCOUNTER — Ambulatory Visit: Payer: Medicare Other | Admitting: Physical Therapy

## 2020-03-07 DIAGNOSIS — R296 Repeated falls: Secondary | ICD-10-CM | POA: Diagnosis not present

## 2020-03-07 DIAGNOSIS — M6281 Muscle weakness (generalized): Secondary | ICD-10-CM | POA: Diagnosis not present

## 2020-03-07 DIAGNOSIS — R2689 Other abnormalities of gait and mobility: Secondary | ICD-10-CM | POA: Diagnosis not present

## 2020-03-07 DIAGNOSIS — R2681 Unsteadiness on feet: Secondary | ICD-10-CM | POA: Diagnosis not present

## 2020-03-07 NOTE — Patient Instructions (Signed)
HIP: Flexion / KNEE: Extension, Heel Strike - Standing - ALTERNATE FORWARD KICKS - AT COUNTER    Reach leg out to take step, knee straight. Land with heel on floor, toes up. __10_ reps per set, _1__ sets per day, _5__ days per week Hold onto a support.  Copyright  VHI. All rights reserved.

## 2020-03-08 ENCOUNTER — Encounter: Payer: Self-pay | Admitting: Physical Therapy

## 2020-03-08 NOTE — Therapy (Signed)
[facility] [address] [place], [place], [address] Phone: [phone]   Fax:  [phone]  Physical Therapy Treatment  Patient Details  Name: Alexandra Lamb MRN: [id number] Date of Birth: Aug 03, 1945 Referring Provider (PT): Dr. Harlan Stains, MD   Encounter Date: 03/07/2020   PT End of Session - 03/08/20 1052    Visit Number 3    Number of Visits 17    Date for PT Re-Evaluation 04/29/20    Authorization Type Medicare and BCBS Supplement; 10th visit PN    PT Start Time 1450    PT Stop Time 1534    PT Time Calculation (min) 44 min    Equipment Utilized During Treatment Gait belt    Activity Tolerance Patient tolerated treatment well    Behavior During Therapy WFL for tasks assessed/performed           Past Medical History:  Diagnosis Date  . Cancer ([place])    left breast  . Diabetes mellitus   . Hyperlipidemia   . Hypertension   . MCI (mild cognitive impairment)   . Schizophrenia ([place])    paranoid    Past Surgical History:  Procedure Laterality Date  . BREAST SURGERY  2011   left breast mast  . MASTECTOMY     left  . TOOTH EXTRACTION      There were no vitals filed for this visit.   Subjective Assessment - 03/07/20 1450    Subjective Pt states she had to take 2 steps to get into [employer] earlier today (did ok with this)  - went to [facility] for first time in several months and rode bike for about 15" ([place]); pt accompanied to PT by her sister    Patient is accompained by: Family member   Sister   Pertinent History PMH: paranoid schizophrenia, diabetes type II, recent HgbA1c approx 8, COPD, HTN, HLD, obesity, mild cognitive impairment, Breast CA (L) and masectomy, vit D deficiency    Limitations Standing;Walking;Lifting;House hold activities    Currently in Pain? No/denies                             [facility] Adult PT Treatment/Exercise - 03/08/20 0001      Transfers   Transfers Sit to  Stand;Stand to Sit    Sit to Stand 4: Min guard;4: Min assist   min assist with transfer with feet on Airex   Stand to Sit 4: Min guard    Number of Reps Other reps (comment)   3 reps feet on floor; 5 reps feet on Airex     Ambulation/Gait   Ambulation/Gait Yes    Ambulation/Gait Assistance 4: Min guard    Ambulation Distance (Feet) 350 Feet    Assistive device None    Gait Pattern Step-through pattern;Decreased step length - right;Decreased step length - left;Decreased arm swing - right;Decreased arm swing - left   foot flat contact   Ambulation Surface Level;Indoor    Stairs Yes    Stairs Assistance 4: Min guard    Stairs Assistance Details (indicate cue type and reason) cues to place toes over edge with descension    Stair Management Technique Alternating pattern;Step to pattern;Forwards;Two rails    Number of Stairs 4    Height of Stairs 6      Exercises   Exercises Knee/Hip      Knee/Hip Exercises: Standing   Heel Raises Both;1 set;10 reps  Hip Flexion Stengthening;Right;Left;10 reps;Knee bent;Knee straight;2 sets   3# weight   Hip Abduction Stengthening;Right;Left;1 set;10 reps;Knee straight   3# weight    Hip Extension Stengthening;Right;Left;1 set;10 reps;Knee straight   3# weight           Pt amb. 40' x 2 reps tossing/catching ball for improved multi-tasking with gait (pt's sister, Webb Silversmith, assisted with this activity as PT provided CGA to min assist for safety & balance)   Balance Exercises - 03/08/20 0001      Balance Exercises: Standing   Standing Eyes Opened Wide (BOA);Head turns;Foam/compliant surface;5 reps   horizontal head turns 5 reps with CGA   Other Standing Exercises Pt performed marching with EO on Airex 10 reps each LE with min hand held assist; performed alternate tap downs to floor from Airex with min HHA    Other Standing Exercises Comments Pt performed ladder negotiation on  floor 5 reps with min HHA decreasding to CGA with repetition - to  fascilitate incr. step length and improved SLS             PT Education - 03/08/20 1049    Education Details added forward kicks alternating to balance HEP    Person(s) Educated Patient;Caregiver(s)    Methods Explanation;Handout    Comprehension Verbalized understanding;Returned demonstration            PT Short Term Goals - 03/08/20 1055      PT SHORT TERM GOAL #1   Title Patient will reduce Five time Sit to Stand time by 3 seconds without hands from standard surface height in order to decrease risk for falls.    Baseline 14.87 s w/o hands from standard arm chair.    Time 4    Period Weeks    Status New    Target Date 03/30/20      PT SHORT TERM GOAL #2   Title Patient will improve BERG balance score by 5 points for CDC and improvement in static and dynamic balance.    Baseline 33/56 02/29/20    Time 4    Period Weeks    Status New    Target Date 03/30/20      PT SHORT TERM GOAL #3   Title Patient will trial use of a straight cane vs RW for >300' using supervision assist.    Baseline 100' with walking stick and CGA    Time 4    Period Weeks    Status New    Target Date 03/30/20      PT SHORT TERM GOAL #4   Title Pt will demonstrate ability to perform initial HEP with supervision and staff will indicate that pt has performed atleast 3 days/week    Time 4    Period Weeks    Status New    Target Date 03/30/20      PT SHORT TERM GOAL #5   Title Pt will participate in assessment of falls risk during gait with DGI    Time 4    Period Weeks    Status New    Target Date 03/30/20             PT Long Term Goals - 03/08/20 1055      PT LONG TERM GOAL #1   Title Pt will increase Berg score to >/= 41/56 for decreased fall risk.    Baseline 33/56 02/29/20    Time 8    Period Weeks    Status New  PT LONG TERM GOAL #2   Title Patient will increase DGI score by 4 points for significant change in dynamic balance    Baseline NT    Time 8    Period Weeks      Status New      PT LONG TERM GOAL #3   Title Pt will ambulate 800+ feet with LRAD at mod-I for increased independence with functional mobility,    Baseline 100' walking stick with CGA    Time 8    Period Weeks    Status New      PT LONG TERM GOAL #4   Title Pt will negotiate 12 stairs with one rails and LRAD safely with supervision    Time 8    Period Weeks    Status New      PT LONG TERM GOAL #5   Title Pt will be able to perform final HEP with staff's supervision and will have re-started community exercise/wellness with group home    Time 8    Period Weeks    Status New                 Plan - 03/08/20 1053    Clinical Impression Statement Pt continues to amb. with gait deviations including Lt shoulder depression, decreased arm swing, and foot flat contact at stance.  Pt able to reduce gait deviations with verbal & tactile cues.  Step length with ladder activity improved with practice and with verbal cues.    Personal Factors and Comorbidities Comorbidity 3+;Age;Behavior Pattern;Social Background;Time since onset of injury/illness/exacerbation;Fitness    Comorbidities paranoid schizophrenia, diabetes type II, recent HgbA1c approx 8, COPD, HTN, HLD, obesity, mild cognitive impairment, Breast CA (L) and masectomy, vit D deficiency    Examination-Activity Limitations Stairs;Stand;Transfers;Locomotion Level;Bend;Dressing    Examination-Participation Restrictions Church;Community Activity    Stability/Clinical Decision Making Evolving/Moderate complexity    Rehab Potential Good    PT Frequency 2x / week    PT Duration 8 weeks    PT Treatment/Interventions ADLs/Self Care Home Management;Stair training;Gait training;Functional mobility training;Therapeutic activities;Balance training;DME Instruction;Therapeutic exercise;Patient/family education;Neuromuscular re-education;Orthotic Fit/Training;Aquatic Therapy;Cognitive remediation    PT Next Visit Plan LUE RESTRICTED FOR BP;   continue gait and balance training    PT Home Exercise Plan Safe strengthening and balance    Consulted and Agree with Plan of Care Patient;Other (Comment)   Sister          Patient will benefit from skilled therapeutic intervention in order to improve the following deficits and impairments:  Abnormal gait, Decreased coordination, Decreased activity tolerance, Decreased cognition, Decreased mobility, Decreased strength, Decreased knowledge of use of DME, Decreased balance, Decreased safety awareness, Difficulty walking, Decreased range of motion, Decreased endurance  Visit Diagnosis: Other abnormalities of gait and mobility  Unsteadiness on feet  Muscle weakness (generalized)     Problem List Patient Active Problem List   Diagnosis Date Noted  . Cognitive impairment 02/19/2020  . Breast cancer of upper-outer quadrant of left female breast ([place]) 05/14/2011  . Vitamin D deficiency 04/02/2011  . DIASTOLIC HEART FAILURE, CHRONIC 07/10/2009  . DIABETES MELLITUS, TYPE II 06/18/2009  . HYPERLIPIDEMIA 06/18/2009  . SCHIZOPHRENIA 06/18/2009  . HYPERTENSION 06/18/2009  . MI 06/18/2009  . RESPIRATORY FAILURE, ACUTE 06/18/2009  . DYSPNEA ON EXERTION 06/18/2009    Alda Lea, PT 03/08/2020, 10:58 AM  [facility] [address] [place], [place], [address] Phone: [phone]   Fax:  [phone]  Name: Honour Schwieger  [place]man MRN: [id number] Date of Birth: 29-Mar-1946

## 2020-03-11 ENCOUNTER — Other Ambulatory Visit: Payer: Self-pay | Admitting: Hematology and Oncology

## 2020-03-12 ENCOUNTER — Other Ambulatory Visit: Payer: Self-pay

## 2020-03-12 ENCOUNTER — Encounter: Payer: Self-pay | Admitting: Physical Therapy

## 2020-03-12 ENCOUNTER — Ambulatory Visit: Payer: Medicare Other | Admitting: Physical Therapy

## 2020-03-12 DIAGNOSIS — R2681 Unsteadiness on feet: Secondary | ICD-10-CM | POA: Diagnosis not present

## 2020-03-12 DIAGNOSIS — R2689 Other abnormalities of gait and mobility: Secondary | ICD-10-CM | POA: Diagnosis not present

## 2020-03-12 DIAGNOSIS — M6281 Muscle weakness (generalized): Secondary | ICD-10-CM

## 2020-03-12 DIAGNOSIS — R296 Repeated falls: Secondary | ICD-10-CM

## 2020-03-13 NOTE — Therapy (Signed)
[facility] [address] [place], [place], [address] Phone: [phone]   Fax:  [phone]  Physical Therapy Treatment  Patient Details  Name: Alexandra Lamb MRN: [id number] Date of Birth: 07-06-1945 Referring Provider (PT): Dr. Harlan Stains, MD   Encounter Date: 03/12/2020   PT End of Session - 03/12/20 1323    Visit Number 4    Number of Visits 17    Date for PT Re-Evaluation 04/29/20    Authorization Type Medicare and BCBS Supplement; 10th visit PN    PT Start Time 1318    PT Stop Time 1400    PT Time Calculation (min) 42 min    Equipment Utilized During Treatment Gait belt    Activity Tolerance Patient tolerated treatment well    Behavior During Therapy WFL for tasks assessed/performed           Past Medical History:  Diagnosis Date  . Cancer ([place])    left breast  . Diabetes mellitus   . Hyperlipidemia   . Hypertension   . MCI (mild cognitive impairment)   . Schizophrenia ([place])    paranoid    Past Surgical History:  Procedure Laterality Date  . BREAST SURGERY  2011   left breast mast  . MASTECTOMY     left  . TOOTH EXTRACTION      There were no vitals filed for this visit.   Subjective Assessment - 03/12/20 1321    Subjective No new complaints. To therapy today with caregiver Jeanine. No falls or pain to report.    Patient is accompained by: Family member   caregiver   Pertinent History PMH: paranoid schizophrenia, diabetes type II, recent HgbA1c approx 8, COPD, HTN, HLD, obesity, mild cognitive impairment, Breast CA (L) and masectomy, vit D deficiency    Limitations Standing;Walking;Lifting;House hold activities    Currently in Pain? No/denies                [facility] Adult PT Treatment/Exercise - 03/12/20 1324      Transfers   Transfers Sit to Stand;Stand to Sit    Sit to Stand 4: Min guard;With upper extremity assist;From bed;From chair/3-in-1    Stand to Sit 4: Min guard;With  upper extremity assist;To bed;To chair/3-in-1      Ambulation/Gait   Ambulation/Gait Yes    Ambulation/Gait Assistance 4: Min guard    Ambulation/Gait Assistance Details working on increased bil step length with heel strike, this improved with single HHA with gait.     Ambulation Distance (Feet) 230 Feet   x1, plus around gym with session   Assistive device None    Gait Pattern Step-through pattern;Decreased step length - right;Decreased step length - left;Decreased arm swing - right;Decreased arm swing - left;Right foot flat;Left foot flat    Ambulation Surface Level;Indoor    Stairs Yes    Stairs Assistance 4: Min guard    Stairs Assistance Details (indicate cue type and reason) caregiver reporting pt having trouble on steps, especially with descending as she tends to slide her feet down vs stepping down. pt is using one rail with this on single UE support. Had pt try with both hands on single rail with pt partially sideways with improved technique and safety noted.  Caregiver instructed to remind pt to use both hands on the rail with stairs.     Stair Management Technique One rail Right;Step to pattern;Forwards   partial sideways   Number of Stairs 4   x3 reps  Height of Stairs 6      Neuro Re-ed    Neuro Re-ed Details  for balance/muscle re-ed: standing with HHA for fwd/bwd stepping over red foam beam for ~8 reps each.  Cues to slow down, for increased step length and for weight shifting.       Knee/Hip Exercises: Aerobic   Recumbent Bike SciFit level 2.0 x6 minutes with UE/LEs with goal >/= 50 rpm for strengthening and activity tolerance.      Knee/Hip Exercises: Standing   Forward Step Up Both;1 set;10 reps;Hand Hold: 2;Step Height: 6";Limitations    Forward Step Up Limitations with contralateral march on non stance leg.                    PT Short Term Goals - 03/08/20 1055      PT SHORT TERM GOAL #1   Title Patient will reduce Five time Sit to Stand time by 3  seconds without hands from standard surface height in order to decrease risk for falls.    Baseline 14.87 s w/o hands from standard arm chair.    Time 4    Period Weeks    Status New    Target Date 03/30/20      PT SHORT TERM GOAL #2   Title Patient will improve BERG balance score by 5 points for CDC and improvement in static and dynamic balance.    Baseline 33/56 02/29/20    Time 4    Period Weeks    Status New    Target Date 03/30/20      PT SHORT TERM GOAL #3   Title Patient will trial use of a straight cane vs RW for >300' using supervision assist.    Baseline 100' with walking stick and CGA    Time 4    Period Weeks    Status New    Target Date 03/30/20      PT SHORT TERM GOAL #4   Title Pt will demonstrate ability to perform initial HEP with supervision and staff will indicate that pt has performed atleast 3 days/week    Time 4    Period Weeks    Status New    Target Date 03/30/20      PT SHORT TERM GOAL #5   Title Pt will participate in assessment of falls risk during gait with DGI    Time 4    Period Weeks    Status New    Target Date 03/30/20             PT Long Term Goals - 03/08/20 1055      PT LONG TERM GOAL #1   Title Pt will increase Berg score to >/= 41/56 for decreased fall risk.    Baseline 33/56 02/29/20    Time 8    Period Weeks    Status New      PT LONG TERM GOAL #2   Title Patient will increase DGI score by 4 points for significant change in dynamic balance    Baseline NT    Time 8    Period Weeks    Status New      PT LONG TERM GOAL #3   Title Pt will ambulate 800+ feet with LRAD at mod-I for increased independence with functional mobility,    Baseline 100' walking stick with CGA    Time 8    Period Weeks    Status New      PT LONG TERM  GOAL #4   Title Pt will negotiate 12 stairs with one rails and LRAD safely with supervision    Time 8    Period Weeks    Status New      PT LONG TERM GOAL #5   Title Pt will be able to  perform final HEP with staff's supervision and will have re-started community exercise/wellness with group home    Time 8    Period Weeks    Status New                 Plan - 03/12/20 1323    Personal Factors and Comorbidities Comorbidity 3+;Age;Behavior Pattern;Social Background;Time since onset of injury/illness/exacerbation;Fitness    Comorbidities paranoid schizophrenia, diabetes type II, recent HgbA1c approx 8, COPD, HTN, HLD, obesity, mild cognitive impairment, Breast CA (L) and masectomy, vit D deficiency    Examination-Activity Limitations Stairs;Stand;Transfers;Locomotion Level;Bend;Dressing    Examination-Participation Restrictions Church;Community Activity    Stability/Clinical Decision Making Evolving/Moderate complexity    Rehab Potential Good    PT Frequency 2x / week    PT Duration 8 weeks    PT Treatment/Interventions ADLs/Self Care Home Management;Stair training;Gait training;Functional mobility training;Therapeutic activities;Balance training;DME Instruction;Therapeutic exercise;Patient/family education;Neuromuscular re-education;Orthotic Fit/Training;Aquatic Therapy;Cognitive remediation    PT Next Visit Plan LUE RESTRICTED FOR BP;  continue gait and balance training    PT Home Exercise Plan Safe strengthening and balance    Consulted and Agree with Plan of Care Patient;Other (Comment)   Sister          Patient will benefit from skilled therapeutic intervention in order to improve the following deficits and impairments:  Abnormal gait, Decreased coordination, Decreased activity tolerance, Decreased cognition, Decreased mobility, Decreased strength, Decreased knowledge of use of DME, Decreased balance, Decreased safety awareness, Difficulty walking, Decreased range of motion, Decreased endurance  Visit Diagnosis: Other abnormalities of gait and mobility  Unsteadiness on feet  Muscle weakness (generalized)  Repeated falls     Problem List Patient Active  Problem List   Diagnosis Date Noted  . Cognitive impairment 02/19/2020  . Breast cancer of upper-outer quadrant of left female breast ([place]) 05/14/2011  . Vitamin D deficiency 04/02/2011  . DIASTOLIC HEART FAILURE, CHRONIC 07/10/2009  . DIABETES MELLITUS, TYPE II 06/18/2009  . HYPERLIPIDEMIA 06/18/2009  . SCHIZOPHRENIA 06/18/2009  . HYPERTENSION 06/18/2009  . MI 06/18/2009  . RESPIRATORY FAILURE, ACUTE 06/18/2009  . DYSPNEA ON EXERTION 06/18/2009    Willow Ora, PTA, [facility]patient Neuro [facility] [address] [place], [place] [address] [phone] 03/13/20, 9:37 PM   Name: Alexandra Lamb MRN: [id number] Date of Birth: [birth year]

## 2020-03-18 DIAGNOSIS — F200 Paranoid schizophrenia: Secondary | ICD-10-CM | POA: Diagnosis not present

## 2020-03-18 DIAGNOSIS — G3184 Mild cognitive impairment, so stated: Secondary | ICD-10-CM | POA: Diagnosis not present

## 2020-03-19 ENCOUNTER — Ambulatory Visit: Payer: Medicare Other | Admitting: Physical Therapy

## 2020-03-19 ENCOUNTER — Other Ambulatory Visit: Payer: Self-pay

## 2020-03-19 ENCOUNTER — Encounter: Payer: Self-pay | Admitting: Physical Therapy

## 2020-03-19 DIAGNOSIS — R2689 Other abnormalities of gait and mobility: Secondary | ICD-10-CM

## 2020-03-19 DIAGNOSIS — R2681 Unsteadiness on feet: Secondary | ICD-10-CM

## 2020-03-19 DIAGNOSIS — M6281 Muscle weakness (generalized): Secondary | ICD-10-CM | POA: Diagnosis not present

## 2020-03-19 DIAGNOSIS — R296 Repeated falls: Secondary | ICD-10-CM | POA: Diagnosis not present

## 2020-03-19 NOTE — Therapy (Signed)
[facility] [address] [place], [place], [address] Phone: [phone]   Fax:  [phone]  Physical Therapy Treatment  Patient Details  Name: Alexandra Lamb MRN: [id number] Date of Birth: 26-Jun-1945 Referring Provider (PT): Dr. Harlan Stains, MD   Encounter Date: 03/19/2020   PT End of Session - 03/19/20 2059    Visit Number 5    Number of Visits 17    Date for PT Re-Evaluation 04/29/20    Authorization Type Medicare and BCBS Supplement; 10th visit PN    PT Start Time 1020    PT Stop Time 1102    PT Time Calculation (min) 42 min    Equipment Utilized During Treatment Gait belt    Activity Tolerance Patient tolerated treatment well    Behavior During Therapy WFL for tasks assessed/performed           Past Medical History:  Diagnosis Date  . Cancer ([place])    left breast  . Diabetes mellitus   . Hyperlipidemia   . Hypertension   . MCI (mild cognitive impairment)   . Schizophrenia ([place])    paranoid    Past Surgical History:  Procedure Laterality Date  . BREAST SURGERY  2011   left breast mast  . MASTECTOMY     left  . TOOTH EXTRACTION      There were no vitals filed for this visit.   Subjective Assessment - 03/19/20 1024    Subjective Pt states she did her exercises in "the manager's office" ; pt walked in today without walking stick    Patient is accompained by: Family member   caregiver   Pertinent History PMH: paranoid schizophrenia, diabetes type II, recent HgbA1c approx 8, COPD, HTN, HLD, obesity, mild cognitive impairment, Breast CA (L) and masectomy, vit D deficiency    Limitations Standing;Walking;Lifting;House hold activities    Patient Stated Goals improve balance    Currently in Pain? No/denies                             [facility] Adult PT Treatment/Exercise - 03/19/20 0001      Transfers   Transfers Sit to Stand;Stand to Sit    Sit to Stand 4: Min guard;4: Min assist    min assist with transfer with feet on Airex   Stand to Sit 4: Min guard    Number of Reps Other reps (comment)   5 reps     Ambulation/Gait   Ambulation/Gait Yes    Ambulation/Gait Assistance 5: Supervision    Ambulation Distance (Feet) 350 Feet    Assistive device None    Gait Pattern Step-through pattern;Decreased step length - right;Decreased step length - left;Decreased arm swing - right;Decreased arm swing - left   foot flat contact   Ambulation Surface Level;Indoor    Stairs Yes    Stairs Assistance 4: Min guard    Stairs Assistance Details (indicate cue type and reason) cues for foot placement with descension    Stair Management Technique Alternating pattern;Step to pattern;Forwards;Two rails    Number of Stairs 4    Height of Stairs 6      Knee/Hip Exercises: Standing   Heel Raises Both;1 set;10 reps    Hip Flexion Stengthening;Right;Left;10 reps;Knee bent;Knee straight;2 sets   3# weight   Hip Abduction Stengthening;Right;Left;1 set;10 reps;Knee straight   3# weight    Hip Extension Stengthening;Right;Left;1 set;10 reps;Knee straight   3# weight  Other Standing Knee Exercises tap ups to 1st step 10 reps each with minimal UE support               Balance Exercises - 03/19/20 0001      Balance Exercises: Standing   Stepping Strategy Anterior;Posterior;Lateral;10 reps   with min HHA   Marching Foam/compliant surface;Static;10 reps   with moderate HHA   Other Standing Exercises stepping over and back of cane on floot (approx. 3" height) 10 reps each foot with min HHA               PT Short Term Goals - 03/19/20 2106      PT SHORT TERM GOAL #1   Title Patient will reduce Five time Sit to Stand time by 3 seconds without hands from standard surface height in order to decrease risk for falls.    Baseline 14.87 s w/o hands from standard arm chair.    Time 4    Period Weeks    Status New    Target Date 03/30/20      PT SHORT TERM GOAL #2   Title Patient will  improve BERG balance score by 5 points for CDC and improvement in static and dynamic balance.    Baseline 33/56 02/29/20    Time 4    Period Weeks    Status New    Target Date 03/30/20      PT SHORT TERM GOAL #3   Title Patient will trial use of a straight cane vs RW for >300' using supervision assist.    Baseline 100' with walking stick and CGA    Time 4    Period Weeks    Status New    Target Date 03/30/20      PT SHORT TERM GOAL #4   Title Pt will demonstrate ability to perform initial HEP with supervision and staff will indicate that pt has performed atleast 3 days/week    Time 4    Period Weeks    Status New    Target Date 03/30/20      PT SHORT TERM GOAL #5   Title Pt will participate in assessment of falls risk during gait with DGI    Time 4    Period Weeks    Status New    Target Date 03/30/20             PT Long Term Goals - 03/19/20 2106      PT LONG TERM GOAL #1   Title Pt will increase Berg score to >/= 41/56 for decreased fall risk.    Baseline 33/56 02/29/20    Time 8    Period Weeks    Status New      PT LONG TERM GOAL #2   Title Patient will increase DGI score by 4 points for significant change in dynamic balance    Baseline NT    Time 8    Period Weeks    Status New      PT LONG TERM GOAL #3   Title Pt will ambulate 800+ feet with LRAD at mod-I for increased independence with functional mobility,    Baseline 100' walking stick with CGA    Time 8    Period Weeks    Status New      PT LONG TERM GOAL #4   Title Pt will negotiate 12 stairs with one rails and LRAD safely with supervision    Time 8    Period Weeks  Status New      PT LONG TERM GOAL #5   Title Pt will be able to perform final HEP with staff's supervision and will have re-started community exercise/wellness with group home    Time Malcolm - 03/19/20 2100    Clinical Impression Statement Pt's caregiver reports that pt  has cataracts and has significant visual deficits and states this problem in combination with her bifocals impacts her safety and ability to safely descend steps.  Caregiver also reports pt has difficutly with depth perception which impacts her ability to step on and off of curbs safely.  Pt making steady gains with gait and balance, but continues to need cues to increase step length and to place heels down first at initial stance phase of gait.  Cont with POC.    Personal Factors and Comorbidities Comorbidity 3+;Age;Behavior Pattern;Social Background;Time since onset of injury/illness/exacerbation;Fitness    Comorbidities paranoid schizophrenia, diabetes type II, recent HgbA1c approx 8, COPD, HTN, HLD, obesity, mild cognitive impairment, Breast CA (L) and masectomy, vit D deficiency    Examination-Activity Limitations Stairs;Stand;Transfers;Locomotion Level;Bend;Dressing    Examination-Participation Restrictions Church;Community Activity    Stability/Clinical Decision Making Evolving/Moderate complexity    Rehab Potential Good    PT Frequency 2x / week    PT Duration 8 weeks    PT Treatment/Interventions ADLs/Self Care Home Management;Stair training;Gait training;Functional mobility training;Therapeutic activities;Balance training;DME Instruction;Therapeutic exercise;Patient/family education;Neuromuscular re-education;Orthotic Fit/Training;Aquatic Therapy;Cognitive remediation    PT Next Visit Plan LUE RESTRICTED FOR BP;  continue gait and balance training    PT Home Exercise Plan Safe strengthening and balance    Consulted and Agree with Plan of Care Patient;Other (Comment)   Sister          Patient will benefit from skilled therapeutic intervention in order to improve the following deficits and impairments:  Abnormal gait, Decreased coordination, Decreased activity tolerance, Decreased cognition, Decreased mobility, Decreased strength, Decreased knowledge of use of DME, Decreased balance,  Decreased safety awareness, Difficulty walking, Decreased range of motion, Decreased endurance  Visit Diagnosis: Unsteadiness on feet  Other abnormalities of gait and mobility  Muscle weakness (generalized)     Problem List Patient Active Problem List   Diagnosis Date Noted  . Cognitive impairment 02/19/2020  . Breast cancer of upper-outer quadrant of left female breast ([place]) 05/14/2011  . Vitamin D deficiency 04/02/2011  . DIASTOLIC HEART FAILURE, CHRONIC 07/10/2009  . DIABETES MELLITUS, TYPE II 06/18/2009  . HYPERLIPIDEMIA 06/18/2009  . SCHIZOPHRENIA 06/18/2009  . HYPERTENSION 06/18/2009  . MI 06/18/2009  . RESPIRATORY FAILURE, ACUTE 06/18/2009  . DYSPNEA ON EXERTION 06/18/2009    Alda Lea, PT 03/19/2020, 9:07 PM  [facility] [address] [place], [place], [address] Phone: [phone]   Fax:  [phone]  Name: Alexandra Lamb MRN: [id number] Date of Birth: 1946-03-07

## 2020-03-21 ENCOUNTER — Ambulatory Visit: Payer: Medicare Other | Attending: Family Medicine | Admitting: Physical Therapy

## 2020-03-21 ENCOUNTER — Other Ambulatory Visit: Payer: Self-pay

## 2020-03-21 ENCOUNTER — Ambulatory Visit: Payer: Medicare Other | Admitting: Physical Therapy

## 2020-03-21 DIAGNOSIS — R296 Repeated falls: Secondary | ICD-10-CM | POA: Diagnosis not present

## 2020-03-21 DIAGNOSIS — R2689 Other abnormalities of gait and mobility: Secondary | ICD-10-CM | POA: Insufficient documentation

## 2020-03-21 DIAGNOSIS — R2681 Unsteadiness on feet: Secondary | ICD-10-CM

## 2020-03-21 DIAGNOSIS — M6281 Muscle weakness (generalized): Secondary | ICD-10-CM | POA: Diagnosis not present

## 2020-03-22 ENCOUNTER — Ambulatory Visit: Payer: Medicare Other | Admitting: Physical Therapy

## 2020-03-22 NOTE — Therapy (Signed)
[facility] [address] [place], [place], [address] Phone: [phone]   Fax:  [phone]  Physical Therapy Treatment  Patient Details  Name: Alexandra Lamb MRN: [id number] Date of Birth: 19-Mar-1946 Referring Provider (PT): Dr. Harlan Stains, MD   Encounter Date: 03/21/2020   PT End of Session - 03/22/20 1957    Visit Number 6    Number of Visits 17    Date for PT Re-Evaluation 04/29/20    Authorization Type Medicare and BCBS Supplement; 10th visit PN    PT Start Time 1535    PT Stop Time 1617    PT Time Calculation (min) 42 min    Equipment Utilized During Treatment Gait belt    Activity Tolerance Patient tolerated treatment well    Behavior During Therapy WFL for tasks assessed/performed           Past Medical History:  Diagnosis Date  . Cancer ([place])    left breast  . Diabetes mellitus   . Hyperlipidemia   . Hypertension   . MCI (mild cognitive impairment)   . Schizophrenia ([place])    paranoid    Past Surgical History:  Procedure Laterality Date  . BREAST SURGERY  2011   left breast mast  . MASTECTOMY     left  . TOOTH EXTRACTION      There were no vitals filed for this visit.   Subjective Assessment - 03/22/20 1954    Subjective Pt's caregiver states pt walked for approx. 2 hours in [place] on Wed. (pushed the cart) - did very well    Patient is accompained by: Family member   caregiver   Pertinent History PMH: paranoid schizophrenia, diabetes type II, recent HgbA1c approx 8, COPD, HTN, HLD, obesity, mild cognitive impairment, Breast CA (L) and masectomy, vit D deficiency    Limitations Standing;Walking;Lifting;House hold activities    Patient Stated Goals improve balance    Currently in Pain? No/denies                             [facility] Adult PT Treatment/Exercise - 03/22/20 0001      Transfers   Transfers Sit to Stand;Stand to Sit    Sit to Stand 4: Min guard;4: Min  assist   min assist with transfer with feet on Airex   Stand to Sit 4: Min guard    Number of Reps Other reps (comment)    Comments feet on Airex      Ambulation/Gait   Ambulation/Gait Yes    Ambulation/Gait Assistance 5: Supervision    Ambulation Distance (Feet) 350 Feet    Assistive device None    Gait Pattern Step-through pattern;Decreased step length - right;Decreased step length - left;Decreased arm swing - right;Decreased arm swing - left   foot flat contact   Ambulation Surface Level;Indoor      Knee/Hip Exercises: Aerobic   Recumbent Bike SciFit level 2.5 x 5 minutes with UE/LEs with goal >/= 50 rpm for strengthening and activity tolerance.      Knee/Hip Exercises: Standing   Heel Raises Both;1 set;10 reps    Hip Flexion Stengthening;Right;Left;10 reps;Knee bent;Knee straight;2 sets   3# weight   Hip Abduction Stengthening;Right;Left;1 set;10 reps;Knee straight   3# weight    Hip Extension Stengthening;Right;Left;1 set;10 reps;Knee straight   3# weight   Other Standing Knee Exercises tap ups to 1st step 10 reps each with minimal UE support  Balance Exercises - 03/22/20 0001      Balance Exercises: Standing   Marching Foam/compliant surface;Static;10 reps   with moderate HHA   Other Standing Exercises stepping over and back of cane on floot (approx. 3" height) 10 reps each foot with min HHA    Other Standing Exercises Comments Pt performed ladder negotiation on  floor 5 reps with min HHA decreasding to CGA with repetition - to fascilitate incr. step length and improved SLS               PT Short Term Goals - 03/22/20 2002      PT SHORT TERM GOAL #1   Title Patient will reduce Five time Sit to Stand time by 3 seconds without hands from standard surface height in order to decrease risk for falls.    Baseline 14.87 s w/o hands from standard arm chair.    Time 4    Period Weeks    Status New    Target Date 03/30/20      PT SHORT TERM GOAL #2    Title Patient will improve BERG balance score by 5 points for CDC and improvement in static and dynamic balance.    Baseline 33/56 02/29/20    Time 4    Period Weeks    Status New    Target Date 03/30/20      PT SHORT TERM GOAL #3   Title Patient will trial use of a straight cane vs RW for >300' using supervision assist.    Baseline 100' with walking stick and CGA    Time 4    Period Weeks    Status New    Target Date 03/30/20      PT SHORT TERM GOAL #4   Title Pt will demonstrate ability to perform initial HEP with supervision and staff will indicate that pt has performed atleast 3 days/week    Time 4    Period Weeks    Status New    Target Date 03/30/20      PT SHORT TERM GOAL #5   Title Pt will participate in assessment of falls risk during gait with DGI    Time 4    Period Weeks    Status New    Target Date 03/30/20             PT Long Term Goals - 03/22/20 2004      PT LONG TERM GOAL #1   Title Pt will increase Berg score to >/= 41/56 for decreased fall risk.    Baseline 33/56 02/29/20    Time 8    Period Weeks    Status New      PT LONG TERM GOAL #2   Title Patient will increase DGI score by 4 points for significant change in dynamic balance    Baseline NT    Time 8    Period Weeks    Status New      PT LONG TERM GOAL #3   Title Pt will ambulate 800+ feet with LRAD at mod-I for increased independence with functional mobility,    Baseline 100' walking stick with CGA    Time 8    Period Weeks    Status New      PT LONG TERM GOAL #4   Title Pt will negotiate 12 stairs with one rails and LRAD safely with supervision    Time 8    Period Weeks    Status New  PT LONG TERM GOAL #5   Title Pt will be able to perform final HEP with staff's supervision and will have re-started community exercise/wellness with group home    Time 8    Period Weeks    Status New                 Plan - 03/22/20 1958    Clinical Impression Statement Pt  appearing much more confident with ambulation as she is not using walking stick when she comes to PT (is no longer bringing walking stick into clinic);  pt walked ahead of PT without hesitation when amb. from clinic lobby to gym area.  Pt does continue to need cues to increase Lt initial heel contact to reduce foot slap at stance phase of gait but is demonstrating improved balance and gait overall.  Cont with POC.    Personal Factors and Comorbidities Comorbidity 3+;Age;Behavior Pattern;Social Background;Time since onset of injury/illness/exacerbation;Fitness    Comorbidities paranoid schizophrenia, diabetes type II, recent HgbA1c approx 8, COPD, HTN, HLD, obesity, mild cognitive impairment, Breast CA (L) and masectomy, vit D deficiency    Examination-Activity Limitations Stairs;Stand;Transfers;Locomotion Level;Bend;Dressing    Examination-Participation Restrictions Church;Community Activity    Stability/Clinical Decision Making Evolving/Moderate complexity    Rehab Potential Good    PT Frequency 2x / week    PT Duration 8 weeks    PT Treatment/Interventions ADLs/Self Care Home Management;Stair training;Gait training;Functional mobility training;Therapeutic activities;Balance training;DME Instruction;Therapeutic exercise;Patient/family education;Neuromuscular re-education;Orthotic Fit/Training;Aquatic Therapy;Cognitive remediation    PT Next Visit Plan LUE RESTRICTED FOR BP;  continue gait and balance training    PT Home Exercise Plan Safe strengthening and balance    Consulted and Agree with Plan of Care Patient;Other (Comment)   Sister          Patient will benefit from skilled therapeutic intervention in order to improve the following deficits and impairments:  Abnormal gait, Decreased coordination, Decreased activity tolerance, Decreased cognition, Decreased mobility, Decreased strength, Decreased knowledge of use of DME, Decreased balance, Decreased safety awareness, Difficulty walking,  Decreased range of motion, Decreased endurance  Visit Diagnosis: Unsteadiness on feet  Other abnormalities of gait and mobility  Muscle weakness (generalized)     Problem List Patient Active Problem List   Diagnosis Date Noted  . Cognitive impairment 02/19/2020  . Breast cancer of upper-outer quadrant of left female breast ([place]) 05/14/2011  . Vitamin D deficiency 04/02/2011  . DIASTOLIC HEART FAILURE, CHRONIC 07/10/2009  . DIABETES MELLITUS, TYPE II 06/18/2009  . HYPERLIPIDEMIA 06/18/2009  . SCHIZOPHRENIA 06/18/2009  . HYPERTENSION 06/18/2009  . MI 06/18/2009  . RESPIRATORY FAILURE, ACUTE 06/18/2009  . DYSPNEA ON EXERTION 06/18/2009    Alda Lea, PT 03/22/2020, 8:06 PM  [facility] [address] [place], [place], [address] Phone: [phone]   Fax:  [phone]  Name: MASIAH LEWING MRN: [id number] Date of Birth: 1946/01/01

## 2020-03-26 ENCOUNTER — Ambulatory Visit: Payer: Medicare Other | Admitting: Physical Therapy

## 2020-03-26 ENCOUNTER — Other Ambulatory Visit: Payer: Self-pay

## 2020-03-26 DIAGNOSIS — R2689 Other abnormalities of gait and mobility: Secondary | ICD-10-CM | POA: Diagnosis not present

## 2020-03-26 DIAGNOSIS — M6281 Muscle weakness (generalized): Secondary | ICD-10-CM | POA: Diagnosis not present

## 2020-03-26 DIAGNOSIS — R296 Repeated falls: Secondary | ICD-10-CM | POA: Diagnosis not present

## 2020-03-26 DIAGNOSIS — R2681 Unsteadiness on feet: Secondary | ICD-10-CM

## 2020-03-27 ENCOUNTER — Encounter: Payer: Self-pay | Admitting: Physical Therapy

## 2020-03-27 ENCOUNTER — Ambulatory Visit: Payer: Medicare Other | Admitting: Physical Therapy

## 2020-03-27 DIAGNOSIS — R2689 Other abnormalities of gait and mobility: Secondary | ICD-10-CM

## 2020-03-27 DIAGNOSIS — R2681 Unsteadiness on feet: Secondary | ICD-10-CM

## 2020-03-27 DIAGNOSIS — M6281 Muscle weakness (generalized): Secondary | ICD-10-CM | POA: Diagnosis not present

## 2020-03-27 DIAGNOSIS — R296 Repeated falls: Secondary | ICD-10-CM | POA: Diagnosis not present

## 2020-03-27 NOTE — Therapy (Signed)
[facility] [address] [place], [place], [address] Phone: [phone]   Fax:  [phone]  Physical Therapy Treatment  Patient Details  Name: Alexandra Lamb MRN: [id number] Date of Birth: 1946/01/14 Referring Provider (PT): Dr. Harlan Stains, MD   Encounter Date: 03/27/2020   PT End of Session - 03/27/20 1456    Visit Number 8    Number of Visits 17    Date for PT Re-Evaluation 04/29/20    Authorization Type Medicare and BCBS Supplement; 10th visit PN    PT Start Time 1449    PT Stop Time 1530    PT Time Calculation (min) 41 min    Equipment Utilized During Treatment Gait belt    Activity Tolerance Patient tolerated treatment well    Behavior During Therapy [facility] for tasks assessed/performed           Past Medical History:  Diagnosis Date  . Cancer ([place])    left breast  . Diabetes mellitus   . Hyperlipidemia   . Hypertension   . MCI (mild cognitive impairment)   . Schizophrenia ([place])    paranoid    Past Surgical History:  Procedure Laterality Date  . BREAST SURGERY  2011   left breast mast  . MASTECTOMY     left  . TOOTH EXTRACTION      There were no vitals filed for this visit.   Subjective Assessment - 03/27/20 1455    Subjective No new complaints. Went with her brother to look at new places to stay. Has her walking stick with her today.    Patient is accompained by: Family member   brother from [place], [place]   Pertinent History PMH: paranoid schizophrenia, diabetes type II, recent HgbA1c approx 8, COPD, HTN, HLD, obesity, mild cognitive impairment, Breast CA (L) and masectomy, vit D deficiency    Limitations Standing;Walking;Lifting;House hold activities    Patient Stated Goals improve balance    Currently in Pain? No/denies              [facility] PT Assessment - 03/27/20 1459      Standardized Balance Assessment   Standardized Balance Assessment Berg Balance Test      Berg Balance Test   Sit  to Stand Able to stand without using hands and stabilize independently    Standing Unsupported Able to stand safely 2 minutes    Sitting with Back Unsupported but Feet Supported on Floor or Stool Able to sit safely and securely 2 minutes    Stand to Sit Sits safely with minimal use of hands    Transfers Able to transfer safely, minor use of hands    Standing Unsupported with Eyes Closed Able to stand 10 seconds safely    Standing Unsupported with Feet Together Able to place feet together independently and stand for 1 minute with supervision    From Standing, Reach Forward with Outstretched Arm Can reach forward >12 cm safely (5")   8 inches   From Standing Position, Pick up Object from Floor Able to pick up shoe, needs supervision    From Standing Position, Turn to Look Behind Over each Shoulder Looks behind one side only/other side shows less weight shift   right>left   Turn 360 Degrees Able to turn 360 degrees safely but slowly   ~6 seconds both ways   Standing Unsupported, Alternately Place Feet on Step/Stool Able to complete 4 steps without aid or supervision   min guard to min assist  after 4 reps. 11.41 sec's.    Standing Unsupported, One Foot in [place] to take small step independently and hold 30 seconds    Standing on One Leg Tries to lift leg/unable to hold 3 seconds but remains standing independently    Total Score 43    Berg comment: 37-45- significant risk for falls               [facility] Adult PT Treatment/Exercise - 03/27/20 1459      Transfers   Transfers Sit to Stand;Stand to Sit    Sit to Stand 4: Min guard;With upper extremity assist;From bed;From chair/3-in-1    Five time sit to stand comments  13.59 sec's with no UE support from standard height chair    Stand to Sit 4: Min guard;With upper extremity assist;Without upper extremity assist;To bed;To chair/3-in-1      Ambulation/Gait   Ambulation/Gait Yes    Ambulation/Gait Assistance 5: Supervision     Ambulation/Gait Assistance Details cues to relax right UE for improved arm swing, for symetrical posture as pt tends to lean toward the left and for increased gait speed with gait.     Ambulation Distance (Feet) 440 Feet   x1, plus around gym with session   Assistive device None;Other (Comment)   walking stick   Gait Pattern Step-through pattern;Decreased step length - right;Decreased step length - left;Decreased arm swing - right;Decreased arm swing - left    Ambulation Surface Level;Indoor      Self-Care   Self-Care Other Self-Care Comments    Other Self-Care Comments  pt reports doing her ex's at home. unable to confirm with caregiver as brother brought her to therapy today and is not aware.                     PT Short Term Goals - 03/27/20 1458      PT SHORT TERM GOAL #1   Title Patient will reduce Five time Sit to Stand time by 3 seconds without hands from standard surface height in order to decrease risk for falls.    Baseline 03/27/20: 13.59 sec's no UE support from standard height chair, decreased, just not to goal level.    Time --    Period --    Status Partially Met    Target Date --      PT SHORT TERM GOAL #2   Title Patient will improve BERG balance score by 5 points for CDC and improvement in static and dynamic balance.    Baseline 03/27/20: 43/56 scored today (increased by 10 points)    Time --    Period --    Status Achieved    Target Date --      PT SHORT TERM GOAL #3   Title Patient will trial use of a straight cane vs RW for >300' using supervision assist.    Baseline 03/27/20: 440 feet today with no AD with supervision on indoors surfaces    Time --    Period --    Status Achieved    Target Date 03/30/20      PT SHORT TERM GOAL #4   Title Pt will demonstrate ability to perform initial HEP with supervision and staff will indicate that pt has performed atleast 3 days/week    Baseline 03/27/20: met with current program per pt report. no Caregiver from  home present to confirm this.    Status Achieved    Target Date 03/30/20  PT SHORT TERM GOAL #5   Title Pt will participate in assessment of falls risk during gait with DGI    Time 4    Period Weeks    Status New    Target Date 03/30/20             PT Long Term Goals - 03/22/20 2004      PT LONG TERM GOAL #1   Title Pt will increase Berg score to >/= 41/56 for decreased fall risk.    Baseline 33/56 02/29/20    Time 8    Period Weeks    Status New      PT LONG TERM GOAL #2   Title Patient will increase DGI score by 4 points for significant change in dynamic balance    Baseline NT    Time 8    Period Weeks    Status New      PT LONG TERM GOAL #3   Title Pt will ambulate 800+ feet with LRAD at mod-I for increased independence with functional mobility,    Baseline 100' walking stick with CGA    Time 8    Period Weeks    Status New      PT LONG TERM GOAL #4   Title Pt will negotiate 12 stairs with one rails and LRAD safely with supervision    Time 8    Period Weeks    Status New      PT LONG TERM GOAL #5   Title Pt will be able to perform final HEP with staff's supervision and will have re-started community exercise/wellness with group home    Time 8    Period Weeks    Status New                 Plan - 03/27/20 1457    Clinical Impression Statement Today's skilled session focused on progress toward STGs with goals partially to fully met. Pt improved her Berg Balance test by 10 points today. The pt also decreased her 5 time sit to stand by about 1 seconds. The pt is progressing toward goals and should benefit from continued PT to progress toward unmet goals.    Personal Factors and Comorbidities Comorbidity 3+;Age;Behavior Pattern;Social Background;Time since onset of injury/illness/exacerbation;Fitness    Comorbidities paranoid schizophrenia, diabetes type II, recent HgbA1c approx 8, COPD, HTN, HLD, obesity, mild cognitive impairment, Breast CA (L) and  masectomy, vit D deficiency    Examination-Activity Limitations Stairs;Stand;Transfers;Locomotion Level;Bend;Dressing    Examination-Participation Restrictions Church;Community Activity    Stability/Clinical Decision Making Evolving/Moderate complexity    Rehab Potential Good    PT Frequency 2x / week    PT Duration 8 weeks    PT Treatment/Interventions ADLs/Self Care Home Management;Stair training;Gait training;Functional mobility training;Therapeutic activities;Balance training;DME Instruction;Therapeutic exercise;Patient/family education;Neuromuscular re-education;Orthotic Fit/Training;Aquatic Therapy;Cognitive remediation    PT Next Visit Plan LUE RESTRICTED FOR BP;  perform DGI to finish checking STGs and have primary PT set LTG. continue with balance training    PT Home Exercise Plan Safe strengthening and balance    Consulted and Agree with Plan of Care Patient;Other (Comment)   Sister          Patient will benefit from skilled therapeutic intervention in order to improve the following deficits and impairments:  Abnormal gait, Decreased coordination, Decreased activity tolerance, Decreased cognition, Decreased mobility, Decreased strength, Decreased knowledge of use of DME, Decreased balance, Decreased safety awareness, Difficulty walking, Decreased range of motion, Decreased endurance  Visit Diagnosis: Unsteadiness  on feet  Other abnormalities of gait and mobility  Muscle weakness (generalized)  Repeated falls     Problem List Patient Active Problem List   Diagnosis Date Noted  . Cognitive impairment 02/19/2020  . Breast cancer of upper-outer quadrant of left female breast ([place]) 05/14/2011  . Vitamin D deficiency 04/02/2011  . DIASTOLIC HEART FAILURE, CHRONIC 07/10/2009  . DIABETES MELLITUS, TYPE II 06/18/2009  . HYPERLIPIDEMIA 06/18/2009  . SCHIZOPHRENIA 06/18/2009  . HYPERTENSION 06/18/2009  . MI 06/18/2009  . RESPIRATORY FAILURE, ACUTE 06/18/2009  . DYSPNEA ON  EXERTION 06/18/2009    Willow Ora, PTA, [facility] [address] [place], [place] [address] [phone] 03/27/20, 5:28 PM   Name: ZELENE BARGA MRN: [id number] Date of Birth: Oct 07, 1945

## 2020-03-27 NOTE — Therapy (Signed)
[facility] [address] [place], [place], [address] Phone: [phone]   Fax:  [phone]  Physical Therapy Treatment  Patient Details  Name: Alexandra Lamb MRN: [id number] Date of Birth: Mar 27, 1946 Referring Provider (PT): Dr. Harlan Stains, MD   Encounter Date: 03/26/2020   PT End of Session - 03/27/20 1706    Visit Number 7    Number of Visits 17    Date for PT Re-Evaluation 04/29/20    Authorization Type Medicare and BCBS Supplement; 10th visit PN    PT Start Time 1018    PT Stop Time 1102    PT Time Calculation (min) 44 min    Equipment Utilized During Treatment Gait belt    Activity Tolerance Patient tolerated treatment well    Behavior During Therapy WFL for tasks assessed/performed           Past Medical History:  Diagnosis Date  . Cancer ([place])    left breast  . Diabetes mellitus   . Hyperlipidemia   . Hypertension   . MCI (mild cognitive impairment)   . Schizophrenia ([place])    paranoid    Past Surgical History:  Procedure Laterality Date  . BREAST SURGERY  2011   left breast mast  . MASTECTOMY     left  . TOOTH EXTRACTION      There were no vitals filed for this visit.   Subjective Assessment - 03/26/20 1023    Subjective Pt presents to PT accompanied by caregiver, Remo Lipps; is ambulating independently without walking stick    Patient is accompained by: Family member   caregiver   Pertinent History PMH: paranoid schizophrenia, diabetes type II, recent HgbA1c approx 8, COPD, HTN, HLD, obesity, mild cognitive impairment, Breast CA (L) and masectomy, vit D deficiency    Limitations Standing;Walking;Lifting;House hold activities    Patient Stated Goals improve balance    Currently in Pain? No/denies            [facility] Adult PT Treatment/Exercise     Transfers   Transfers Sit to Stand;Stand to Sit    Sit to Stand 4: Min guard;4: Min assist   min assist with transfer with feet on Airex    Stand to Sit 4: Min guard    Number of Reps Other reps (comment)   5 reps     Ambulation/Gait   Ambulation/Gait Yes    Ambulation/Gait Assistance 5: Supervision    Ambulation Distance (Feet) 230 Feet    Assistive device None    Gait Pattern Step-through pattern;Decreased step length - right;Decreased step length - left;Decreased arm swing - right;Decreased arm swing - left   foot flat contact   Ambulation Surface Level;Indoor    Stairs Yes    Stairs Assistance 4: Min guard    Stairs Assistance Details (indicate cue type and reason) cues for foot placement with descension    Stair Management Technique Alternating pattern;Step to pattern;Forwards;Two rails    Number of Stairs 4    Height of Stairs 6      Knee/Hip Exercises: Standing   Heel Raises Both;1 set;10 reps    Hip Flexion Stengthening;Right;Left;10 reps;Knee bent;Knee straight;2 sets   3# weight   Hip Abduction Stengthening;Right;Left;1 set;10 reps;Knee straight   3# weight    Hip Extension Stengthening;Right;Left;1 set;10 reps;Knee straight   3# weight   Other Standing Knee Exercises tap ups to 1st step 10 reps each with minimal UE support  TherAct.:  Pt played zoom ball standing on Airex for improved balance on compliant surfaces and for improved core stabilization Pt needed mod assist due to significant fear of falling with this activity   Neuro Re-ed: Pt performed rocker board inside // bars with bil. UE support 10 reps with min assist  Pt performed touching balance bubbles (3) for improved SLS on each leg - min assist for recovery of LOB                       PT Short Term Goals - 03/27/20 1458      PT SHORT TERM GOAL #1   Title Patient will reduce Five time Sit to Stand time by 3 seconds without hands from standard surface height in order to decrease risk for falls.    Baseline 14.87 s w/o hands from standard arm chair.    Time 4    Period Weeks    Status New    Target Date  03/30/20      PT SHORT TERM GOAL #2   Title Patient will improve BERG balance score by 5 points for CDC and improvement in static and dynamic balance.    Baseline 33/56 02/29/20    Time 4    Period Weeks    Status New    Target Date 03/30/20      PT SHORT TERM GOAL #3   Title Patient will trial use of a straight cane vs RW for >300' using supervision assist.    Baseline 100' with walking stick and CGA    Time 4    Period Weeks    Status New    Target Date 03/30/20      PT SHORT TERM GOAL #4   Title Pt will demonstrate ability to perform initial HEP with supervision and staff will indicate that pt has performed atleast 3 days/week    Baseline 03/27/20: met with current program    Status Achieved    Target Date 03/30/20      PT SHORT TERM GOAL #5   Title Pt will participate in assessment of falls risk during gait with DGI    Time 4    Period Weeks    Status New    Target Date 03/30/20             PT Long Term Goals - 03/22/20 2004      PT LONG TERM GOAL #1   Title Pt will increase Berg score to >/= 41/56 for decreased fall risk.    Baseline 33/56 02/29/20    Time 8    Period Weeks    Status New      PT LONG TERM GOAL #2   Title Patient will increase DGI score by 4 points for significant change in dynamic balance    Baseline NT    Time 8    Period Weeks    Status New      PT LONG TERM GOAL #3   Title Pt will ambulate 800+ feet with LRAD at mod-I for increased independence with functional mobility,    Baseline 100' walking stick with CGA    Time 8    Period Weeks    Status New      PT LONG TERM GOAL #4   Title Pt will negotiate 12 stairs with one rails and LRAD safely with supervision    Time 8    Period Weeks    Status New  PT LONG TERM GOAL #5   Title Pt will be able to perform final HEP with staff's supervision and will have re-started community exercise/wellness with group home    Time 8    Period Weeks    Status New                   Patient will benefit from skilled therapeutic intervention in order to improve the following deficits and impairments:     Visit Diagnosis: Unsteadiness on feet  Other abnormalities of gait and mobility  Muscle weakness (generalized)     Problem List Patient Active Problem List   Diagnosis Date Noted  . Cognitive impairment 02/19/2020  . Breast cancer of upper-outer quadrant of left female breast ([place]) 05/14/2011  . Vitamin D deficiency 04/02/2011  . DIASTOLIC HEART FAILURE, CHRONIC 07/10/2009  . DIABETES MELLITUS, TYPE II 06/18/2009  . HYPERLIPIDEMIA 06/18/2009  . SCHIZOPHRENIA 06/18/2009  . HYPERTENSION 06/18/2009  . MI 06/18/2009  . RESPIRATORY FAILURE, ACUTE 06/18/2009  . DYSPNEA ON EXERTION 06/18/2009    Alda Lea, PT 03/27/2020, 5:10 PM  [facility] [address] [place], [place], [address] Phone: [phone]   Fax:  [phone]  Name: Alexandra Lamb MRN: [id number] Date of Birth: 12-16-1945

## 2020-03-28 ENCOUNTER — Ambulatory Visit: Payer: Medicare Other | Admitting: Physical Therapy

## 2020-03-29 ENCOUNTER — Ambulatory Visit: Payer: Medicare Other | Admitting: Physical Therapy

## 2020-04-01 ENCOUNTER — Ambulatory Visit: Payer: Medicare Other | Admitting: Physical Therapy

## 2020-04-01 ENCOUNTER — Other Ambulatory Visit: Payer: Self-pay

## 2020-04-01 DIAGNOSIS — R2689 Other abnormalities of gait and mobility: Secondary | ICD-10-CM

## 2020-04-01 DIAGNOSIS — R2681 Unsteadiness on feet: Secondary | ICD-10-CM | POA: Diagnosis not present

## 2020-04-01 DIAGNOSIS — M6281 Muscle weakness (generalized): Secondary | ICD-10-CM | POA: Diagnosis not present

## 2020-04-01 DIAGNOSIS — R296 Repeated falls: Secondary | ICD-10-CM

## 2020-04-01 NOTE — Patient Instructions (Signed)
HIP: Flexion / KNEE: Extension, Heel Strike - Standing - ALTERNATE FORWARD KICKS - AT COUNTER    Reach leg out to take step, knee straight. Land with heel on floor, toes up. __10_ reps per set, _1__ sets per day, _5__ days per week Hold onto a support.  Strengthening: Hip Extension - Resisted    With tubing around both ankles, face counter and pull leg straight back.  Try not to lean forwards Repeat ___10_ times per set. Do _2___ sets per session. Do __1__ sessions per day.    Strengthening: Hip Abduction - Resisted    With tubing around both thighs, extend one leg out from side. Repeat _10___ times per set. Do __2__ sets per session. Do __1__ sessions per day.   Strengthening: Hip Flexion - Resisted    With tubing around both thighs bring leg forward, BEND your KNEE.  HOLD in the air for 2-3 seconds. Repeat __10__ times per set. Do __2__ sets per session. Do __1__ sessions per day.   Functional Quadriceps: Sit to Stand with Band around Thighs    Sit on edge of chair, feet flat on floor. Place Blue band around both thighs.  Press both legs out into the band while you Stand upright.  Push into the band again while sitting back down slowly. Repeat __12__ times per set. Do __1__ sets per session. Do _1___ sessions per day.

## 2020-04-01 NOTE — Therapy (Signed)
[facility] [address] [place], [place], [address] Phone: [phone]   Fax:  [phone]  Physical Therapy Treatment  Patient Details  Name: Alexandra Lamb MRN: [id number] Date of Birth: 1946-02-04 Referring Provider (PT): Dr. Harlan Stains, MD   Encounter Date: 04/01/2020   PT End of Session - 04/01/20 1222    Visit Number 8    Number of Visits 17    Date for PT Re-Evaluation 04/29/20    Authorization Type Medicare and BCBS Supplement; 10th visit PN    Progress Note Due on Visit 10    PT Start Time 1112    PT Stop Time 1151    PT Time Calculation (min) 39 min    Activity Tolerance Patient tolerated treatment well    Behavior During Therapy [facility] for tasks assessed/performed           Past Medical History:  Diagnosis Date  . Cancer ([place])    left breast  . Diabetes mellitus   . Hyperlipidemia   . Hypertension   . MCI (mild cognitive impairment)   . Schizophrenia ([place])    paranoid    Past Surgical History:  Procedure Laterality Date  . BREAST SURGERY  2011   left breast mast  . MASTECTOMY     left  . TOOTH EXTRACTION      There were no vitals filed for this visit.   Subjective Assessment - 04/01/20 1115    Subjective Used her walking stick when she went to church yesterday but did not bring it today.  Exercises are going well.  No falls.  Is going to be moving to [facility] but isn't sure when.  Will be moving into ALF.  Won't have to worry about stairs at new facility.    Patient is accompained by: Family member   brother from [place], [place]   Pertinent History PMH: paranoid schizophrenia, diabetes type II, recent HgbA1c approx 8, COPD, HTN, HLD, obesity, mild cognitive impairment, Breast CA (L) and masectomy, vit D deficiency    Limitations Standing;Walking;Lifting;House hold activities    Patient Stated Goals improve balance    Currently in Pain? No/denies              [facility] PT Assessment  - 04/01/20 1118      Standardized Balance Assessment   Standardized Balance Assessment Dynamic Gait Index      Dynamic Gait Index   Level Surface Moderate Impairment    Change in Gait Speed Moderate Impairment    Gait with Horizontal Head Turns Mild Impairment    Gait with Vertical Head Turns Mild Impairment    Gait and Pivot Turn Normal    Step Over Obstacle Mild Impairment    Step Around Obstacles Mild Impairment    Steps Moderate Impairment    Total Score 14    DGI comment: 14/24                         [facility] Adult PT Treatment/Exercise - 04/01/20 1214      Exercises   Exercises Other Exercises    Other Exercises  updated and progressed current HEP by adding resistance of blue theraband around thighs for sit <> stand, hip extension, flexion and ABD.  No change to stepping forward with heel strike today.  Pt return demonstrated each exercise with caregiver observing; updated written instructions and blue theraband provided to pt and caregiver.  HIP: Flexion / KNEE: Extension, Heel Strike - Standing - ALTERNATE FORWARD KICKS - AT COUNTER    Reach leg out to take step, knee straight. Land with heel on floor, toes up. __10_ reps per set, _1__ sets per day, _5__ days per week Hold onto a support.  Strengthening: Hip Extension - Resisted    With tubing around both ankles, face counter and pull leg straight back.  Try not to lean forwards Repeat ___10_ times per set. Do _2___ sets per session. Do __1__ sessions per day.    Strengthening: Hip Abduction - Resisted    With tubing around both thighs, extend one leg out from side. Repeat _10___ times per set. Do __2__ sets per session. Do __1__ sessions per day.   Strengthening: Hip Flexion - Resisted    With tubing around both thighs bring leg forward, BEND your KNEE.  HOLD in the air for 2-3 seconds. Repeat __10__ times per set. Do __2__ sets per session. Do __1__ sessions per  day.   Functional Quadriceps: Sit to Stand with Band around Thighs    Sit on edge of chair, feet flat on floor. Place Blue band around both thighs.  Press both legs out into the band while you Stand upright.  Push into the band again while sitting back down slowly. Repeat __12__ times per set. Do __1__ sets per session. Do _1___ sessions per day.       PT Education - 04/01/20 1218    Education Details progressed HEP, results of DGI    Person(s) Educated Patient    Methods Handout;Verbal cues;Tactile cues;Demonstration;Explanation    Comprehension Verbalized understanding;Returned demonstration            PT Short Term Goals - 04/01/20 1223      PT SHORT TERM GOAL #1   Title Patient will reduce Five time Sit to Stand time by 3 seconds without hands from standard surface height in order to decrease risk for falls.    Baseline 03/27/20: 13.59 sec's no UE support from standard height chair, decreased, just not to goal level.    Status Partially Met      PT SHORT TERM GOAL #2   Title Patient will improve BERG balance score by 5 points for CDC and improvement in static and dynamic balance.    Baseline 03/27/20: 43/56 scored today (increased by 10 points)    Status Achieved      PT SHORT TERM GOAL #3   Title Patient will trial use of a straight cane vs RW for >300' using supervision assist.    Baseline 03/27/20: 440 feet today with no AD with supervision on indoors surfaces    Status Achieved    Target Date 03/30/20      PT SHORT TERM GOAL #4   Title Pt will demonstrate ability to perform initial HEP with supervision and staff will indicate that pt has performed atleast 3 days/week    Status Achieved    Target Date 03/30/20      PT SHORT TERM GOAL #5   Title Pt will participate in assessment of falls risk during gait with DGI    Baseline performed on 12/13: 14/24    Time 4    Period Weeks    Status Achieved    Target Date 03/30/20             PT Long Term Goals -  04/01/20 1224      PT LONG TERM GOAL #1   Title Pt  will increase Berg score to >/= 41/56 for decreased fall risk.    Baseline 33/56 02/29/20    Time 8    Period Weeks    Status New    Target Date 04/29/20      PT LONG TERM GOAL #2   Title Patient will increase DGI score by 4 points for significant change in dynamic balance    Baseline 14/24    Time 8    Period Weeks    Status Revised    Target Date 04/29/20      PT LONG TERM GOAL #3   Title Pt will ambulate 800+ feet with LRAD at mod-I for increased independence with functional mobility,    Baseline 100' walking stick with CGA    Time 8    Period Weeks    Status New    Target Date 04/29/20      PT LONG TERM GOAL #4   Title Pt will negotiate 12 stairs with one rails and LRAD safely with supervision    Time 8    Period Weeks    Status New    Target Date 04/29/20      PT LONG TERM GOAL #5   Title Pt will be able to perform final HEP with staff's supervision and will have re-started community exercise/wellness with group home    Time 8    Period Weeks    Status New    Target Date 04/29/20                 Plan - 04/01/20 1226    Clinical Impression Statement Continued to assess STG with assessment of DGI and falls risk.  Pt demonstrates high falls risk as indicated by score 14/24.  Due to progress with LE strength updated and progressed HEP to include theraband resistance.  Pt will continue to require supervision of caregivers to perform safely and correctly.  Will continue to progress towards LTG.    Personal Factors and Comorbidities Comorbidity 3+;Age;Behavior Pattern;Social Background;Time since onset of injury/illness/exacerbation;Fitness    Comorbidities paranoid schizophrenia, diabetes type II, recent HgbA1c approx 8, COPD, HTN, HLD, obesity, mild cognitive impairment, Breast CA (L) and masectomy, vit D deficiency    Examination-Activity Limitations Stairs;Stand;Transfers;Locomotion Level;Bend;Dressing     Examination-Participation Restrictions Church;Community Activity    Stability/Clinical Decision Making Evolving/Moderate complexity    Rehab Potential Good    PT Frequency 2x / week    PT Duration 8 weeks    PT Treatment/Interventions ADLs/Self Care Home Management;Stair training;Gait training;Functional mobility training;Therapeutic activities;Balance training;DME Instruction;Therapeutic exercise;Patient/family education;Neuromuscular re-education;Orthotic Fit/Training;Aquatic Therapy;Cognitive remediation    PT Next Visit Plan LUE RESTRICTED FOR BP;  continue with balance training and gait training focusing on foot clearance, stair negotiation, negotiating over obstacles, around obstacles, compliant surfaces    PT Home Exercise Plan Safe strengthening and balance    Consulted and Agree with Plan of Care Patient;Other (Comment)   Sister          Patient will benefit from skilled therapeutic intervention in order to improve the following deficits and impairments:  Abnormal gait,Decreased coordination,Decreased activity tolerance,Decreased cognition,Decreased mobility,Decreased strength,Decreased knowledge of use of DME,Decreased balance,Decreased safety awareness,Difficulty walking,Decreased range of motion,Decreased endurance  Visit Diagnosis: Unsteadiness on feet  Other abnormalities of gait and mobility  Muscle weakness (generalized)  Repeated falls     Problem List Patient Active Problem List   Diagnosis Date Noted  . Cognitive impairment 02/19/2020  . Breast cancer of upper-outer quadrant of left female breast ([place])  05/14/2011  . Vitamin D deficiency 04/02/2011  . DIASTOLIC HEART FAILURE, CHRONIC 07/10/2009  . DIABETES MELLITUS, TYPE II 06/18/2009  . HYPERLIPIDEMIA 06/18/2009  . SCHIZOPHRENIA 06/18/2009  . HYPERTENSION 06/18/2009  . MI 06/18/2009  . RESPIRATORY FAILURE, ACUTE 06/18/2009  . DYSPNEA ON EXERTION 06/18/2009    Rico Junker, PT, DPT 04/01/20    12:29  PM    [facility] [address] [place], [place], [address] Phone: [phone]   Fax:  [phone]  Name: Alexandra Lamb MRN: [id number] Date of Birth: December 09, 1945

## 2020-04-02 ENCOUNTER — Ambulatory Visit: Payer: Medicare Other | Admitting: Physical Therapy

## 2020-04-02 DIAGNOSIS — R296 Repeated falls: Secondary | ICD-10-CM | POA: Diagnosis not present

## 2020-04-02 DIAGNOSIS — R2681 Unsteadiness on feet: Secondary | ICD-10-CM

## 2020-04-02 DIAGNOSIS — R2689 Other abnormalities of gait and mobility: Secondary | ICD-10-CM | POA: Diagnosis not present

## 2020-04-02 DIAGNOSIS — M6281 Muscle weakness (generalized): Secondary | ICD-10-CM | POA: Diagnosis not present

## 2020-04-03 ENCOUNTER — Encounter: Payer: Self-pay | Admitting: Physical Therapy

## 2020-04-03 NOTE — Therapy (Signed)
[facility] [address] [place], [place], [address] Phone: [phone]   Fax:  [phone]  Physical Therapy Treatment  Patient Details  Name: Alexandra Lamb MRN: [id number] Date of Birth: 1946-01-18 Referring Provider (PT): Dr. Harlan Stains, MD   Encounter Date: 04/02/2020   PT End of Session - 04/03/20 1651    Visit Number 9    Number of Visits 17    Date for PT Re-Evaluation 04/29/20    Authorization Type Medicare and BCBS Supplement; 10th visit PN    Progress Note Due on Visit 10    PT Start Time 0933    PT Stop Time 1016    PT Time Calculation (min) 43 min    Equipment Utilized During Treatment Gait belt    Activity Tolerance Patient tolerated treatment well    Behavior During Therapy [facility] for tasks assessed/performed           Past Medical History:  Diagnosis Date  . Cancer ([place])    left breast  . Diabetes mellitus   . Hyperlipidemia   . Hypertension   . MCI (mild cognitive impairment)   . Schizophrenia ([place])    paranoid    Past Surgical History:  Procedure Laterality Date  . BREAST SURGERY  2011   left breast mast  . MASTECTOMY     left  . TOOTH EXTRACTION      There were no vitals filed for this visit.   Subjective Assessment - 04/03/20 1639    Subjective Pt amb. into clinic without walking stick today - accompanied by Ezzie Dural, caregiver from group home; no problems reported    Patient is accompained by: Family member   brother from [place], [place]   Pertinent History PMH: paranoid schizophrenia, diabetes type II, recent HgbA1c approx 8, COPD, HTN, HLD, obesity, mild cognitive impairment, Breast CA (L) and masectomy, vit D deficiency    Limitations Standing;Walking;Lifting;House hold activities    Patient Stated Goals improve balance    Currently in Pain? No/denies                             [facility] Adult PT Treatment/Exercise - 04/03/20 0001      Ambulation/Gait    Ambulation/Gait Yes    Ambulation/Gait Assistance 5: Supervision    Ambulation/Gait Assistance Details tactile cue to elevate Lt shoulder shoulder and verbal cues to increase step length    Ambulation Distance (Feet) 350 Feet    Assistive device None    Gait Pattern Step-through pattern;Decreased step length - right;Decreased step length - left;Decreased arm swing - right;Decreased arm swing - left    Ambulation Surface Level;Indoor    Stairs Yes    Stairs Assistance 5: Supervision    Stair Management Technique Two rails;Alternating pattern;Step to pattern;Forwards   alternating with ascension; step to with descension   Number of Stairs 4    Height of Stairs 6    Ramp 5: Supervision    Curb 5: Supervision   cues for toe placement over edge of curb     Knee/Hip Exercises: Standing   Hip Flexion Stengthening;Right;Left;10 reps;Knee bent;Knee straight;2 sets   3# weight   Hip Abduction Stengthening;Right;Left;1 set;10 reps;Knee straight   3# weight    Hip Extension Stengthening;Right;Left;1 set;10 reps;Knee straight   3# weight     Knee/Hip Exercises: Seated   Long Arc Quad Strengthening;Both;1 set;10 reps;Weights   3# weight  Balance Exercises - 04/03/20 0001      Balance Exercises: Standing   Rockerboard Anterior/posterior;EO;10 reps;UE support   inside // bars   Step Over Hurdles / Cones stepping over balance beam iwth min HHA 10 reps each leg    Marching Foam/compliant surface;Static;10 reps   with moderate HHA   Other Standing Exercises pt performed negotiation of ladder on floor - step over step sequence with min HHA initially, progressing to no UE support; then stepping out/in of ladder with min HHA               PT Short Term Goals - 04/03/20 1659      PT SHORT TERM GOAL #1   Title Patient will reduce Five time Sit to Stand time by 3 seconds without hands from standard surface height in order to decrease risk for falls.    Baseline 03/27/20: 13.59  sec's no UE support from standard height chair, decreased, just not to goal level.    Status Partially Met      PT SHORT TERM GOAL #2   Title Patient will improve BERG balance score by 5 points for CDC and improvement in static and dynamic balance.    Baseline 03/27/20: 43/56 scored today (increased by 10 points)    Status Achieved      PT SHORT TERM GOAL #3   Title Patient will trial use of a straight cane vs RW for >300' using supervision assist.    Baseline 03/27/20: 440 feet today with no AD with supervision on indoors surfaces    Status Achieved    Target Date 03/30/20      PT SHORT TERM GOAL #4   Title Pt will demonstrate ability to perform initial HEP with supervision and staff will indicate that pt has performed atleast 3 days/week    Status Achieved    Target Date 03/30/20      PT SHORT TERM GOAL #5   Title Pt will participate in assessment of falls risk during gait with DGI    Baseline performed on 12/13: 14/24    Time 4    Period Weeks    Status Achieved    Target Date 03/30/20             PT Long Term Goals - 04/03/20 1700      PT LONG TERM GOAL #1   Title Pt will increase Berg score to >/= 41/56 for decreased fall risk.    Baseline 33/56 02/29/20    Time 8    Period Weeks    Status New      PT LONG TERM GOAL #2   Title Patient will increase DGI score by 4 points for significant change in dynamic balance    Baseline 14/24    Time 8    Period Weeks    Status Revised      PT LONG TERM GOAL #3   Title Pt will ambulate 800+ feet with LRAD at mod-I for increased independence with functional mobility,    Baseline 100' walking stick with CGA    Time 8    Period Weeks    Status New      PT LONG TERM GOAL #4   Title Pt will negotiate 12 stairs with one rails and LRAD safely with supervision    Time 8    Period Weeks    Status New      PT LONG TERM GOAL #5   Title Pt will be able to perform  final HEP with staff's supervision and will have re-started  community exercise/wellness with group home    Time 8    Period Weeks    Status New                 Plan - 04/03/20 1652    Clinical Impression Statement Pt improving with ambulation on flat, even surfaces without use of walking stick but continues to have gait deviations including decreased arm swing with Lt shoulder depression and decr. step length with decr. initial heel contact at stance.  Pt continues to have SLS deficits on each leg. Cont with POC.    Personal Factors and Comorbidities Comorbidity 3+;Age;Behavior Pattern;Social Background;Time since onset of injury/illness/exacerbation;Fitness    Comorbidities paranoid schizophrenia, diabetes type II, recent HgbA1c approx 8, COPD, HTN, HLD, obesity, mild cognitive impairment, Breast CA (L) and masectomy, vit D deficiency    Examination-Activity Limitations Stairs;Stand;Transfers;Locomotion Level;Bend;Dressing    Examination-Participation Restrictions Church;Community Activity    Stability/Clinical Decision Making Evolving/Moderate complexity    Rehab Potential Good    PT Frequency 2x / week    PT Duration 8 weeks    PT Treatment/Interventions ADLs/Self Care Home Management;Stair training;Gait training;Functional mobility training;Therapeutic activities;Balance training;DME Instruction;Therapeutic exercise;Patient/family education;Neuromuscular re-education;Orthotic Fit/Training;Aquatic Therapy;Cognitive remediation    PT Next Visit Plan 10th visit progress note due next session! LUE RESTRICTED FOR BP;  continue with balance training and gait training focusing on foot clearance, stair negotiation, negotiating over obstacles, around obstacles, compliant surfaces    PT Home Exercise Plan Safe strengthening and balance    Consulted and Agree with Plan of Care Patient;Other (Comment)   Sister          Patient will benefit from skilled therapeutic intervention in order to improve the following deficits and impairments:  Abnormal  gait,Decreased coordination,Decreased activity tolerance,Decreased cognition,Decreased mobility,Decreased strength,Decreased knowledge of use of DME,Decreased balance,Decreased safety awareness,Difficulty walking,Decreased range of motion,Decreased endurance  Visit Diagnosis: Unsteadiness on feet  Other abnormalities of gait and mobility  Muscle weakness (generalized)     Problem List Patient Active Problem List   Diagnosis Date Noted  . Cognitive impairment 02/19/2020  . Breast cancer of upper-outer quadrant of left female breast ([place]) 05/14/2011  . Vitamin D deficiency 04/02/2011  . DIASTOLIC HEART FAILURE, CHRONIC 07/10/2009  . DIABETES MELLITUS, TYPE II 06/18/2009  . HYPERLIPIDEMIA 06/18/2009  . SCHIZOPHRENIA 06/18/2009  . HYPERTENSION 06/18/2009  . MI 06/18/2009  . RESPIRATORY FAILURE, ACUTE 06/18/2009  . DYSPNEA ON EXERTION 06/18/2009    Alda Lea, PT 04/03/2020, [facility] [address] [place], [place], [address] Phone: [phone]   Fax:  [phone]  Name: Alexandra Lamb MRN: [id number] Date of Birth: Jun 27, 1945

## 2020-04-04 ENCOUNTER — Ambulatory Visit: Payer: Medicare Other | Admitting: Physical Therapy

## 2020-04-05 ENCOUNTER — Telehealth: Payer: Self-pay

## 2020-04-05 ENCOUNTER — Ambulatory Visit: Payer: Medicare Other | Admitting: Physical Therapy

## 2020-04-05 NOTE — Telephone Encounter (Signed)
RN spoke with patient's legal guardian, Arbie Cookey.   Arbie Cookey called to inquire if Dr. Lindi Adie would be comfortable releasing care to PCP, Dr. Harlan Stains.  Per Arbie Cookey patient receives her yearly mammograms and does have regular breast exams by PCP.    Patient has currently completed 10 years of Tamoxifen therapy and can discontinue this medication per MD recommendation.  Dr. Lindi Adie is in agreement to release patient to PCP for care.  RN assured caregiver to call for any future needs.

## 2020-04-08 ENCOUNTER — Ambulatory Visit: Payer: Medicare Other | Admitting: Physical Therapy

## 2020-04-08 ENCOUNTER — Encounter: Payer: Self-pay | Admitting: Physical Therapy

## 2020-04-08 ENCOUNTER — Other Ambulatory Visit: Payer: Self-pay

## 2020-04-08 DIAGNOSIS — R2689 Other abnormalities of gait and mobility: Secondary | ICD-10-CM | POA: Diagnosis not present

## 2020-04-08 DIAGNOSIS — R296 Repeated falls: Secondary | ICD-10-CM | POA: Diagnosis not present

## 2020-04-08 DIAGNOSIS — R2681 Unsteadiness on feet: Secondary | ICD-10-CM | POA: Diagnosis not present

## 2020-04-08 DIAGNOSIS — M6281 Muscle weakness (generalized): Secondary | ICD-10-CM | POA: Diagnosis not present

## 2020-04-08 NOTE — Therapy (Signed)
[facility] [address] [place], [place], [address] Phone: [phone]   Fax:  [phone]  Physical Therapy Treatment and 10th visit Progress Note  Patient Details  Name: Alexandra Lamb MRN: [id number] Date of Birth: 1945-07-22 Referring Provider (PT): Dr. Harlan Stains, MD   Encounter Date: 04/08/2020   Progress Note Reporting Period 02/29/2020 to 04/08/2020  See note below for Objective Data and Assessment of Progress/Goals.     PT End of Session - 04/08/20 1203    Visit Number 10    Number of Visits 17    Date for PT Re-Evaluation 04/29/20    Authorization Type Medicare and BCBS Supplement; 10th visit PN    Progress Note Due on Visit 20    PT Start Time 1100    PT Stop Time 1143    PT Time Calculation (min) 43 min    Activity Tolerance Patient tolerated treatment well    Behavior During Therapy WFL for tasks assessed/performed           Past Medical History:  Diagnosis Date  . Cancer ([place])    left breast  . Diabetes mellitus   . Hyperlipidemia   . Hypertension   . MCI (mild cognitive impairment)   . Schizophrenia ([place])    paranoid    Past Surgical History:  Procedure Laterality Date  . BREAST SURGERY  2011   left breast mast  . MASTECTOMY     left  . TOOTH EXTRACTION      There were no vitals filed for this visit.   Subjective Assessment - 04/08/20 1104    Subjective Exercises going okay with resistance band.  Not sure if she will going to her sister's house for Christmas.  No falls.    Patient is accompained by: Family member   brother from [place], [place]   Pertinent History PMH: paranoid schizophrenia, diabetes type II, recent HgbA1c approx 8, COPD, HTN, HLD, obesity, mild cognitive impairment, Breast CA (L) and masectomy, vit D deficiency    Limitations Standing;Walking;Lifting;House hold activities    Patient Stated Goals improve balance    Currently in Pain? No/denies                              [facility] Adult PT Treatment/Exercise - 04/08/20 1105      Ambulation/Gait   Ambulation/Gait Yes    Ambulation/Gait Assistance 5: Supervision    Ambulation/Gait Assistance Details continued cues for increased step and stride length bilaterally and for heel strike at initial contact.  When cued pt able to perform but not able to sustain when not attending to gait specifically.    Ambulation Distance (Feet) 200 Feet    Assistive device None    Gait Pattern Step-through pattern;Decreased step length - right;Decreased step length - left;Decreased arm swing - right;Decreased arm swing - left;Step-to pattern;Decreased stride length;Poor foot clearance - left;Poor foot clearance - right;Wide base of support    Ambulation Surface Level;Indoor      Knee/Hip Exercises: Stretches   Press photographer Right;Left;1 rep;60 seconds    Gastroc Stretch Limitations on rockerboard      Knee/Hip Exercises: Aerobic   Nustep Seat setting at 5; resistance at 5.  Bilat UE and LE for aerobic conditioning and strengthening with focus on keeping steps per minute between 40-50 x 6 minutes               Balance Exercises - 04/08/20  1131      Balance Exercises: Standing   SLS Eyes open;Solid surface;Upper extremity support 1;3 reps;10 secs    Rockerboard Anterior/posterior;EO;10 reps;UE support;Limitations    Rockerboard Limitations began with bilat UE support on // bars > one UE support; cues to shift weight with hips and not by moving head back and forth      OTAGO PROGRAM   Tandem Walk Support    Heel Walking Support               PT Short Term Goals - 04/03/20 1659      PT SHORT TERM GOAL #1   Title Patient will reduce Five time Sit to Stand time by 3 seconds without hands from standard surface height in order to decrease risk for falls.    Baseline 03/27/20: 13.59 sec's no UE support from standard height chair, decreased, just not to goal level.    Status  Partially Met      PT SHORT TERM GOAL #2   Title Patient will improve BERG balance score by 5 points for CDC and improvement in static and dynamic balance.    Baseline 03/27/20: 43/56 scored today (increased by 10 points)    Status Achieved      PT SHORT TERM GOAL #3   Title Patient will trial use of a straight cane vs RW for >300' using supervision assist.    Baseline 03/27/20: 440 feet today with no AD with supervision on indoors surfaces    Status Achieved    Target Date 03/30/20      PT SHORT TERM GOAL #4   Title Pt will demonstrate ability to perform initial HEP with supervision and staff will indicate that pt has performed atleast 3 days/week    Status Achieved    Target Date 03/30/20      PT SHORT TERM GOAL #5   Title Pt will participate in assessment of falls risk during gait with DGI    Baseline performed on 12/13: 14/24    Time 4    Period Weeks    Status Achieved    Target Date 03/30/20             PT Long Term Goals - 04/08/20 1209      PT LONG TERM GOAL #1   Title Pt will increase Berg score to >/= 41/56 for decreased fall risk.  (LTG due by 04/30/2019)    Baseline 33/56 02/29/20    Time 8    Period Weeks    Status New      PT LONG TERM GOAL #2   Title Patient will increase DGI score by 4 points for significant change in dynamic balance    Baseline 14/24    Time 8    Period Weeks    Status Revised      PT LONG TERM GOAL #3   Title Pt will ambulate 800+ feet with LRAD at mod-I for increased independence with functional mobility,    Baseline 100' walking stick with CGA    Time 8    Period Weeks    Status New      PT LONG TERM GOAL #4   Title Pt will negotiate 12 stairs with one rails and LRAD safely with supervision    Time 8    Period Weeks    Status New      PT LONG TERM GOAL #5   Title Pt will be able to perform final HEP with staff's supervision and will  have re-started community exercise/wellness with group home    Time 8    Period Weeks     Status New                 Plan - 04/08/20 1206    Clinical Impression Statement Continued to focus on dynamic weight shifting, hip and ankle strengthening and SLS training to improve foot clearance during swing phase and stance time to decrease shuffling gait.  Pt able to demonstrate safer gait sequence when cued but without cues pt unable to attention to gait sequence.  Will continue to address and progress towards LTG.    Personal Factors and Comorbidities Comorbidity 3+;Age;Behavior Pattern;Social Background;Time since onset of injury/illness/exacerbation;Fitness    Comorbidities paranoid schizophrenia, diabetes type II, recent HgbA1c approx 8, COPD, HTN, HLD, obesity, mild cognitive impairment, Breast CA (L) and masectomy, vit D deficiency    Examination-Activity Limitations Stairs;Stand;Transfers;Locomotion Level;Bend;Dressing    Examination-Participation Restrictions Church;Community Activity    Stability/Clinical Decision Making Evolving/Moderate complexity    Rehab Potential Good    PT Frequency 2x / week    PT Duration 8 weeks    PT Treatment/Interventions ADLs/Self Care Home Management;Stair training;Gait training;Functional mobility training;Therapeutic activities;Balance training;DME Instruction;Therapeutic exercise;Patient/family education;Neuromuscular re-education;Orthotic Fit/Training;Aquatic Therapy;Cognitive remediation    PT Next Visit Plan LUE RESTRICTED FOR BP;  Will be transitioning to [facility] first week of January.  Will continue with therapy through end of January - need to schedule more visits.  Continue to work on SLS, weight shifting, balance with narrow BOS, foot clearance during gait    PT Home Exercise Plan Safe strengthening and balance    Consulted and Agree with Plan of Care Patient;Other (Comment)   Sister          Patient will benefit from skilled therapeutic intervention in order to improve the following deficits and impairments:  Abnormal  gait,Decreased coordination,Decreased activity tolerance,Decreased cognition,Decreased mobility,Decreased strength,Decreased knowledge of use of DME,Decreased balance,Decreased safety awareness,Difficulty walking,Decreased range of motion,Decreased endurance  Visit Diagnosis: Unsteadiness on feet  Other abnormalities of gait and mobility  Muscle weakness (generalized)  Repeated falls     Problem List Patient Active Problem List   Diagnosis Date Noted  . Cognitive impairment 02/19/2020  . Breast cancer of upper-outer quadrant of left female breast ([place]) 05/14/2011  . Vitamin D deficiency 04/02/2011  . DIASTOLIC HEART FAILURE, CHRONIC 07/10/2009  . DIABETES MELLITUS, TYPE II 06/18/2009  . HYPERLIPIDEMIA 06/18/2009  . SCHIZOPHRENIA 06/18/2009  . HYPERTENSION 06/18/2009  . MI 06/18/2009  . RESPIRATORY FAILURE, ACUTE 06/18/2009  . DYSPNEA ON EXERTION 06/18/2009    Rico Junker, PT, DPT 04/08/20    12:15 PM    [facility] [address] [place], [place], [address] Phone: [phone]   Fax:  [phone]  Name: JHADE BERKO MRN: [id number] Date of Birth: 03-16-1946

## 2020-04-10 ENCOUNTER — Encounter: Payer: Self-pay | Admitting: Physical Therapy

## 2020-04-10 ENCOUNTER — Other Ambulatory Visit: Payer: Self-pay

## 2020-04-10 ENCOUNTER — Ambulatory Visit: Payer: Medicare Other | Admitting: Physical Therapy

## 2020-04-10 DIAGNOSIS — R2681 Unsteadiness on feet: Secondary | ICD-10-CM

## 2020-04-10 DIAGNOSIS — R296 Repeated falls: Secondary | ICD-10-CM

## 2020-04-10 DIAGNOSIS — M6281 Muscle weakness (generalized): Secondary | ICD-10-CM | POA: Diagnosis not present

## 2020-04-10 DIAGNOSIS — R2689 Other abnormalities of gait and mobility: Secondary | ICD-10-CM | POA: Diagnosis not present

## 2020-04-10 NOTE — Therapy (Signed)
[facility] [address] [place], [place], [address] Phone: [phone]   Fax:  [phone]  Physical Therapy Treatment  Patient Details  Name: Alexandra Lamb MRN: [id number] Date of Birth: [birth year] Referring Provider (PT): Dr. Harlan Stains, MD   Encounter Date: 04/10/2020   PT End of Session - 04/10/20 1024    Visit Number 11    Number of Visits 17    Date for PT Re-Evaluation 04/29/20    Authorization Type Medicare and BCBS Supplement; 10th visit PN    Progress Note Due on Visit 20    PT Start Time 1018    PT Stop Time 1100    PT Time Calculation (min) 42 min    Equipment Utilized During Treatment Gait belt    Activity Tolerance Patient tolerated treatment well    Behavior During Therapy WFL for tasks assessed/performed           Past Medical History:  Diagnosis Date  . Cancer ([place])    left breast  . Diabetes mellitus   . Hyperlipidemia   . Hypertension   . MCI (mild cognitive impairment)   . Schizophrenia ([place])    paranoid    Past Surgical History:  Procedure Laterality Date  . BREAST SURGERY  2011   left breast mast  . MASTECTOMY     left  . TOOTH EXTRACTION      There were no vitals filed for this visit.   Subjective Assessment - 04/10/20 1022    Subjective No new complaitns. No falls or pain to report.    Patient is accompained by: Family member   caregiver, [place]   Pertinent History PMH: paranoid schizophrenia, diabetes type II, recent HgbA1c approx 8, COPD, HTN, HLD, obesity, mild cognitive impairment, Breast CA (L) and masectomy, vit D deficiency                [facility] Adult PT Treatment/Exercise - 04/10/20 1025      Transfers   Transfers Sit to Stand;Stand to Sit    Sit to Stand 5: Supervision;With upper extremity assist;From bed;From chair/3-in-1    Stand to Sit 4: Min guard;With upper extremity assist;To bed;To chair/3-in-1      Ambulation/Gait   Ambulation/Gait Yes     Ambulation/Gait Assistance 5: Supervision;4: Min guard    Ambulation/Gait Assistance Details after floor ladder ex's had pt walk one lap working heel strike with pt stating "heel" to remider her and set pace. then used a Metranome with next gait rep to set pace working on increased step length and heel strike as well. No carryover noted with gait around session afterwards unless pt was cued.    Ambulation Distance (Feet) 115 Feet   x1, 230 x1, plus around gym with session   Assistive device 1 person hand held assist;None    Gait Pattern Step-through pattern;Decreased step length - right;Decreased step length - left;Decreased arm swing - right;Decreased arm swing - left;Step-to pattern;Decreased stride length;Poor foot clearance - left;Poor foot clearance - right;Wide base of support    Ambulation Surface Level;Indoor    Pre-Gait Activities use of floor ladder- working on heel strike with each step with one foot to a box for 8 laps, HHA for balance      Knee/Hip Exercises: Aerobic   Nustep level 5 with UE/LE's with goal >/= 40 steps per minute for 8 minutes for strengthening and activity tolerance  Balance Exercises - 04/10/20 1048      Balance Exercises: Standing   Standing Eyes Opened Wide (BOA);Foam/compliant surface;Limitations    Standing Eyes Opened Limitations on airex with feet apart, no UE support: EO for head movements left<>right, up<>down wtih min guard to min assist with cues to task needed as well.    Standing Eyes Closed Wide (BOA);Foam/compliant surface;3 reps;20 secs;Limitations    Standing Eyes Closed Limitations on airex with feet hip width apart, no UE support for EC 15-20 sec's x 3 reps with min assist, cues on posture/weight shifting to assist with balance.               PT Short Term Goals - 04/03/20 1659      PT SHORT TERM GOAL #1   Title Patient will reduce Five time Sit to Stand time by 3 seconds without hands from standard surface height in  order to decrease risk for falls.    Baseline 03/27/20: 13.59 sec's no UE support from standard height chair, decreased, just not to goal level.    Status Partially Met      PT SHORT TERM GOAL #2   Title Patient will improve BERG balance score by 5 points for CDC and improvement in static and dynamic balance.    Baseline 03/27/20: 43/56 scored today (increased by 10 points)    Status Achieved      PT SHORT TERM GOAL #3   Title Patient will trial use of a straight cane vs RW for >300' using supervision assist.    Baseline 03/27/20: 440 feet today with no AD with supervision on indoors surfaces    Status Achieved    Target Date 03/30/20      PT SHORT TERM GOAL #4   Title Pt will demonstrate ability to perform initial HEP with supervision and staff will indicate that pt has performed atleast 3 days/week    Status Achieved    Target Date 03/30/20      PT SHORT TERM GOAL #5   Title Pt will participate in assessment of falls risk during gait with DGI    Baseline performed on 12/13: 14/24    Time 4    Period Weeks    Status Achieved    Target Date 03/30/20             PT Long Term Goals - 04/08/20 1209      PT LONG TERM GOAL #1   Title Pt will increase Berg score to >/= 41/56 for decreased fall risk.  (LTG due by 04/30/2019)    Baseline 33/56 02/29/20    Time 8    Period Weeks    Status New      PT LONG TERM GOAL #2   Title Patient will increase DGI score by 4 points for significant change in dynamic balance    Baseline 14/24    Time 8    Period Weeks    Status Revised      PT LONG TERM GOAL #3   Title Pt will ambulate 800+ feet with LRAD at mod-I for increased independence with functional mobility,    Baseline 100' walking stick with CGA    Time 8    Period Weeks    Status New      PT LONG TERM GOAL #4   Title Pt will negotiate 12 stairs with one rails and LRAD safely with supervision    Time 8    Period Weeks    Status New  PT LONG TERM GOAL #5   Title Pt  will be able to perform final HEP with staff's supervision and will have re-started community exercise/wellness with group home    Time 8    Period Weeks    Status New                 Plan - 04/10/20 1024    Clinical Impression Statement Today's skilled session continued to focus on strengthening, gait training with emphasis on heel strike/step length and balance reaction training. Rest breaks taken as needed. No other issues noted or reported in session. The pt is progressing toward goals and should benefit from continued PT to progress toward unmet goals    Personal Factors and Comorbidities Comorbidity 3+;Age;Behavior Pattern;Social Background;Time since onset of injury/illness/exacerbation;Fitness    Comorbidities paranoid schizophrenia, diabetes type II, recent HgbA1c approx 8, COPD, HTN, HLD, obesity, mild cognitive impairment, Breast CA (L) and masectomy, vit D deficiency    Examination-Activity Limitations Stairs;Stand;Transfers;Locomotion Level;Bend;Dressing    Examination-Participation Restrictions Church;Community Activity    Stability/Clinical Decision Making Evolving/Moderate complexity    Rehab Potential Good    PT Frequency 2x / week    PT Duration 8 weeks    PT Treatment/Interventions ADLs/Self Care Home Management;Stair training;Gait training;Functional mobility training;Therapeutic activities;Balance training;DME Instruction;Therapeutic exercise;Patient/family education;Neuromuscular re-education;Orthotic Fit/Training;Aquatic Therapy;Cognitive remediation    PT Next Visit Plan LUE RESTRICTED FOR BP;  Will be transitioning to [facility] first week of January.  Will continue with therapy through end of January - need to schedule more visits.  Continue to work on SLS, weight shifting, balance with narrow BOS, foot clearance during gait    PT Home Exercise Plan Safe strengthening and balance    Consulted and Agree with Plan of Care Patient;Other (Comment)   Sister           Patient will benefit from skilled therapeutic intervention in order to improve the following deficits and impairments:  Abnormal gait,Decreased coordination,Decreased activity tolerance,Decreased cognition,Decreased mobility,Decreased strength,Decreased knowledge of use of DME,Decreased balance,Decreased safety awareness,Difficulty walking,Decreased range of motion,Decreased endurance  Visit Diagnosis: Unsteadiness on feet  Other abnormalities of gait and mobility  Muscle weakness (generalized)  Repeated falls     Problem List Patient Active Problem List   Diagnosis Date Noted  . Cognitive impairment 02/19/2020  . Breast cancer of upper-outer quadrant of left female breast ([place]) 05/14/2011  . Vitamin D deficiency 04/02/2011  . DIASTOLIC HEART FAILURE, CHRONIC 07/10/2009  . DIABETES MELLITUS, TYPE II 06/18/2009  . HYPERLIPIDEMIA 06/18/2009  . SCHIZOPHRENIA 06/18/2009  . HYPERTENSION 06/18/2009  . MI 06/18/2009  . RESPIRATORY FAILURE, ACUTE 06/18/2009  . DYSPNEA ON EXERTION 06/18/2009    Willow Ora, PTA, [facility] [address] [place], [place] [address] [phone] 04/10/20, 4:34 PM   Name: Alexandra Lamb MRN: [id number] Date of Birth: Nov 09, 1945

## 2020-04-15 ENCOUNTER — Ambulatory Visit: Payer: Medicare Other | Admitting: Physical Therapy

## 2020-04-17 ENCOUNTER — Other Ambulatory Visit: Payer: Self-pay

## 2020-04-17 ENCOUNTER — Encounter: Payer: Self-pay | Admitting: Physical Therapy

## 2020-04-17 ENCOUNTER — Ambulatory Visit: Payer: Medicare Other | Admitting: Physical Therapy

## 2020-04-17 DIAGNOSIS — M6281 Muscle weakness (generalized): Secondary | ICD-10-CM

## 2020-04-17 DIAGNOSIS — R2681 Unsteadiness on feet: Secondary | ICD-10-CM

## 2020-04-17 DIAGNOSIS — R296 Repeated falls: Secondary | ICD-10-CM

## 2020-04-17 DIAGNOSIS — R2689 Other abnormalities of gait and mobility: Secondary | ICD-10-CM

## 2020-04-17 NOTE — Therapy (Signed)
[facility] [address] [place], [place], [address] Phone: [phone]   Fax:  [phone]  Physical Therapy Treatment  Patient Details  Name: Alexandra Lamb MRN: [id number] Date of Birth: [birth year] Referring Provider (PT): Dr. Harlan Stains, MD   Encounter Date: 04/17/2020   PT End of Session - 04/17/20 1109    Visit Number 12    Number of Visits 17    Date for PT Re-Evaluation 04/29/20    Authorization Type Medicare and BCBS Supplement; 10th visit PN    Progress Note Due on Visit 20    PT Start Time 1105    PT Stop Time 1145    PT Time Calculation (min) 40 min    Equipment Utilized During Treatment Gait belt    Activity Tolerance Patient tolerated treatment well    Behavior During Therapy WFL for tasks assessed/performed           Past Medical History:  Diagnosis Date  . Cancer ([place])    left breast  . Diabetes mellitus   . Hyperlipidemia   . Hypertension   . MCI (mild cognitive impairment)   . Schizophrenia ([place])    paranoid    Past Surgical History:  Procedure Laterality Date  . BREAST SURGERY  2011   left breast mast  . MASTECTOMY     left  . TOOTH EXTRACTION      There were no vitals filed for this visit.   Subjective Assessment - 04/17/20 1108    Subjective No new complaints. No falls or pain per pt report. Reports has not had time to do the HEP, however has been to the [facility].    Pertinent History PMH: paranoid schizophrenia, diabetes type II, recent HgbA1c approx 8, COPD, HTN, HLD, obesity, mild cognitive impairment, Breast CA (L) and masectomy, vit D deficiency    Limitations Standing;Walking;Lifting;House hold activities    Patient Stated Goals improve balance    Currently in Pain? No/denies                 [facility] Adult PT Treatment/Exercise - 04/17/20 1111      Transfers   Transfers Sit to Stand;Stand to Sit    Sit to Stand 5: Supervision;With upper extremity assist;From  bed;From chair/3-in-1    Stand to Sit 4: Min guard;With upper extremity assist;To bed;To chair/3-in-1      Ambulation/Gait   Ambulation/Gait Yes    Ambulation/Gait Assistance 5: Supervision;4: Min guard    Ambulation/Gait Assistance Details after floor ladder activities continued working on large steps with heel strike with gait around tracks. min guard assist for safety while working on the larger steps.    Ambulation Distance (Feet) 230 Feet   x1, plus around gym with session   Assistive device None    Gait Pattern Step-through pattern;Decreased step length - right;Decreased step length - left;Decreased arm swing - right;Decreased arm swing - left;Step-to pattern;Decreased stride length;Poor foot clearance - left;Poor foot clearance - right;Wide base of support    Ambulation Surface Level;Indoor    Pre-Gait Activities use of floor ladder with emphasis on one foot to a box and on heel strike each time for 8 laps, pt stating "heel" with each step to self remind on heel strike, min HHA.      High Level Balance   High Level Balance Activities Side stepping;Marching forwards;Marching backwards    High Level Balance Comments on blue mat in parallel bars: 4 laps each/each way with cues on form  and technique. Min guard assist for safety.      Knee/Hip Exercises: Aerobic   Nustep Level 5.0 with UE/LE's for 5 minutes with goal >/= 40 steps per minute, cues to maintain speed and move through full range needed.                PT Short Term Goals - 04/03/20 1659      PT SHORT TERM GOAL #1   Title Patient will reduce Five time Sit to Stand time by 3 seconds without hands from standard surface height in order to decrease risk for falls.    Baseline 03/27/20: 13.59 sec's no UE support from standard height chair, decreased, just not to goal level.    Status Partially Met      PT SHORT TERM GOAL #2   Title Patient will improve BERG balance score by 5 points for CDC and improvement in static and  dynamic balance.    Baseline 03/27/20: 43/56 scored today (increased by 10 points)    Status Achieved      PT SHORT TERM GOAL #3   Title Patient will trial use of a straight cane vs RW for >300' using supervision assist.    Baseline 03/27/20: 440 feet today with no AD with supervision on indoors surfaces    Status Achieved    Target Date 03/30/20      PT SHORT TERM GOAL #4   Title Pt will demonstrate ability to perform initial HEP with supervision and staff will indicate that pt has performed atleast 3 days/week    Status Achieved    Target Date 03/30/20      PT SHORT TERM GOAL #5   Title Pt will participate in assessment of falls risk during gait with DGI    Baseline performed on 12/13: 14/24    Time 4    Period Weeks    Status Achieved    Target Date 03/30/20             PT Long Term Goals - 04/08/20 1209      PT LONG TERM GOAL #1   Title Pt will increase Berg score to >/= 41/56 for decreased fall risk.  (LTG due by 04/30/2019)    Baseline 33/56 02/29/20    Time 8    Period Weeks    Status New      PT LONG TERM GOAL #2   Title Patient will increase DGI score by 4 points for significant change in dynamic balance    Baseline 14/24    Time 8    Period Weeks    Status Revised      PT LONG TERM GOAL #3   Title Pt will ambulate 800+ feet with LRAD at mod-I for increased independence with functional mobility,    Baseline 100' walking stick with CGA    Time 8    Period Weeks    Status New      PT LONG TERM GOAL #4   Title Pt will negotiate 12 stairs with one rails and LRAD safely with supervision    Time 8    Period Weeks    Status New      PT LONG TERM GOAL #5   Title Pt will be able to perform final HEP with staff's supervision and will have re-started community exercise/wellness with group home    Time 8    Period Weeks    Status New  Plan - 04/17/20 1110    Clinical Impression Statement Today's skilled session continued to focus on  strengthening, balance and gait with rest breaks taken as needed. No other issues noted or reported with session. The pt is progressing and should benefit from continued PT to progress toward unmet goals.    Personal Factors and Comorbidities Comorbidity 3+;Age;Behavior Pattern;Social Background;Time since onset of injury/illness/exacerbation;Fitness    Comorbidities paranoid schizophrenia, diabetes type II, recent HgbA1c approx 8, COPD, HTN, HLD, obesity, mild cognitive impairment, Breast CA (L) and masectomy, vit D deficiency    Examination-Activity Limitations Stairs;Stand;Transfers;Locomotion Level;Bend;Dressing    Examination-Participation Restrictions Church;Community Activity    Stability/Clinical Decision Making Evolving/Moderate complexity    Rehab Potential Good    PT Frequency 2x / week    PT Duration 8 weeks    PT Treatment/Interventions ADLs/Self Care Home Management;Stair training;Gait training;Functional mobility training;Therapeutic activities;Balance training;DME Instruction;Therapeutic exercise;Patient/family education;Neuromuscular re-education;Orthotic Fit/Training;Aquatic Therapy;Cognitive remediation    PT Next Visit Plan LUE RESTRICTED FOR BP;  Will be transitioning to [facility] first week of January.  Will continue with therapy through end of January - need to schedule more visits- sister has been called to work on this. Continue to work on [employer], weight shifitng, balance with narrow base of support, foot clearance with gait and LE strengthening.    PT Home Exercise Plan Safe strengthening and balance    Consulted and Agree with Plan of Care Patient;Other (Comment)   Sister          Patient will benefit from skilled therapeutic intervention in order to improve the following deficits and impairments:  Abnormal gait,Decreased coordination,Decreased activity tolerance,Decreased cognition,Decreased mobility,Decreased strength,Decreased knowledge of use of DME,Decreased  balance,Decreased safety awareness,Difficulty walking,Decreased range of motion,Decreased endurance  Visit Diagnosis: Unsteadiness on feet  Other abnormalities of gait and mobility  Muscle weakness (generalized)  Repeated falls     Problem List Patient Active Problem List   Diagnosis Date Noted  . Cognitive impairment 02/19/2020  . Breast cancer of upper-outer quadrant of left female breast ([place]) 05/14/2011  . Vitamin D deficiency 04/02/2011  . DIASTOLIC HEART FAILURE, CHRONIC 07/10/2009  . DIABETES MELLITUS, TYPE II 06/18/2009  . HYPERLIPIDEMIA 06/18/2009  . SCHIZOPHRENIA 06/18/2009  . HYPERTENSION 06/18/2009  . MI 06/18/2009  . RESPIRATORY FAILURE, ACUTE 06/18/2009  . DYSPNEA ON EXERTION 06/18/2009    Willow Ora, PTA, [facility] [address], [place], [place] [address] [phone] 04/17/20, 5:04 PM   Name: Alexandra Lamb MRN: [id number] Date of Birth: September 20, 1945

## 2020-04-18 ENCOUNTER — Ambulatory Visit: Payer: Medicare Other | Admitting: Physical Therapy

## 2020-04-18 ENCOUNTER — Encounter: Payer: Self-pay | Admitting: Physical Therapy

## 2020-04-18 DIAGNOSIS — R2681 Unsteadiness on feet: Secondary | ICD-10-CM | POA: Diagnosis not present

## 2020-04-18 DIAGNOSIS — M6281 Muscle weakness (generalized): Secondary | ICD-10-CM

## 2020-04-18 DIAGNOSIS — R296 Repeated falls: Secondary | ICD-10-CM | POA: Diagnosis not present

## 2020-04-18 DIAGNOSIS — R2689 Other abnormalities of gait and mobility: Secondary | ICD-10-CM | POA: Diagnosis not present

## 2020-04-19 NOTE — Therapy (Signed)
[facility] [address] [place], [place], [address] Phone: [phone]   Fax:  [phone]  Physical Therapy Treatment  Patient Details  Name: Alexandra Lamb MRN: [id number] Date of Birth: 04-20-1946 Referring Provider (PT): Dr. Harlan Stains, MD   Encounter Date: 04/18/2020   PT End of Session - 04/19/20 1250    Visit Number 13    Number of Visits 17    Date for PT Re-Evaluation 04/29/20    Authorization Type Medicare and BCBS Supplement; 10th visit PN    Progress Note Due on Visit 20    PT Start Time 1102    PT Stop Time 1146    PT Time Calculation (min) 44 min    Equipment Utilized During Treatment Gait belt    Activity Tolerance Patient tolerated treatment well    Behavior During Therapy WFL for tasks assessed/performed           Past Medical History:  Diagnosis Date  . Cancer ([place])    left breast  . Diabetes mellitus   . Hyperlipidemia   . Hypertension   . MCI (mild cognitive impairment)   . Schizophrenia ([place])    paranoid    Past Surgical History:  Procedure Laterality Date  . BREAST SURGERY  2011   left breast mast  . MASTECTOMY     left  . TOOTH EXTRACTION      There were no vitals filed for this visit.   Subjective Assessment - 04/18/20 1108    Subjective Pt states she has already been to the [facility] this morning  - rode the bike; pt walking into clinic without her walking stick today    Pertinent History PMH: paranoid schizophrenia, diabetes type II, recent HgbA1c approx 8, COPD, HTN, HLD, obesity, mild cognitive impairment, Breast CA (L) and masectomy, vit D deficiency    Limitations Standing;Walking;Lifting;House hold activities    Patient Stated Goals improve balance    Currently in Pain? No/denies                             [facility] Adult PT Treatment/Exercise - 04/19/20 0001      Transfers   Transfers Sit to Stand;Stand to Sit    Sit to Stand 5: Supervision;With  upper extremity assist;From bed;From chair/3-in-1    Stand to Sit 4: Min guard    Comments feet on blue [employer]      Ambulation/Gait   Ambulation/Gait Yes    Ambulation/Gait Assistance 5: Supervision    Ambulation/Gait Assistance Details cues for increase step length and arm swing; also cues for increased heel strike    Ambulation Distance (Feet) 230 Feet    Assistive device None    Gait Pattern Step-through pattern    Ambulation Surface Level;Indoor      Knee/Hip Exercises: Standing   Hip Flexion Stengthening;Right;Left;10 reps;Knee bent;Knee straight;2 sets   3# weight   Hip Abduction Stengthening;Right;Left;1 set;10 reps;Knee straight   3# weight    Hip Extension Stengthening;Right;Left;1 set;10 reps;Knee straight   3# weight              Balance Exercises - 04/19/20 0001      Balance Exercises: Standing   Marching Foam/compliant surface;Static;10 reps   with moderate HHA   Other Standing Exercises pt performed touching balance bubbles for tagets (3) with each foot; progressing to using 2 balance bubbles with pt performing diagonal pattern for maintaining balance with crossing midline  5 reps each foot with CGA to min assist    Other Standing Exercises Comments Pt performed ladder negotiation on  floor 5 reps with min HHA decreasing to CGA with repetition - to fascilitate incr. step length and improved SLS               PT Short Term Goals - 04/19/20 1254      PT SHORT TERM GOAL #1   Title Patient will reduce Five time Sit to Stand time by 3 seconds without hands from standard surface height in order to decrease risk for falls.    Baseline 03/27/20: 13.59 sec's no UE support from standard height chair, decreased, just not to goal level.    Status Partially Met      PT SHORT TERM GOAL #2   Title Patient will improve BERG balance score by 5 points for CDC and improvement in static and dynamic balance.    Baseline 03/27/20: 43/56 scored today (increased by 10 points)     Status Achieved      PT SHORT TERM GOAL #3   Title Patient will trial use of a straight cane vs RW for >300' using supervision assist.    Baseline 03/27/20: 440 feet today with no AD with supervision on indoors surfaces    Status Achieved    Target Date 03/30/20      PT SHORT TERM GOAL #4   Title Pt will demonstrate ability to perform initial HEP with supervision and staff will indicate that pt has performed atleast 3 days/week    Status Achieved    Target Date 03/30/20      PT SHORT TERM GOAL #5   Title Pt will participate in assessment of falls risk during gait with DGI    Baseline performed on 12/13: 14/24    Time 4    Period Weeks    Status Achieved    Target Date 03/30/20             PT Long Term Goals - 04/19/20 1254      PT LONG TERM GOAL #1   Title Pt will increase Berg score to >/= 41/56 for decreased fall risk.  (LTG due by 04/30/2019)    Baseline 33/56 02/29/20    Time 8    Period Weeks    Status New      PT LONG TERM GOAL #2   Title Patient will increase DGI score by 4 points for significant change in dynamic balance    Baseline 14/24    Time 8    Period Weeks    Status Revised      PT LONG TERM GOAL #3   Title Pt will ambulate 800+ feet with LRAD at mod-I for increased independence with functional mobility,    Baseline 100' walking stick with CGA    Time 8    Period Weeks    Status New      PT LONG TERM GOAL #4   Title Pt will negotiate 12 stairs with one rails and LRAD safely with supervision    Time 8    Period Weeks    Status New      PT LONG TERM GOAL #5   Title Pt will be able to perform final HEP with staff's supervision and will have re-started community exercise/wellness with group home    Time 8    Period Weeks    Status New  Plan - 04/19/20 1252    Clinical Impression Statement Pt is progressing towards goals - pt amb. into clinic today without use of walking stick.  Continues to have gait deviations with  decreased step length and decreased heel strike at initial stance but balance is improving and pt appears to be more confident with her mobility and movement.  Cont with POC.    Personal Factors and Comorbidities Comorbidity 3+;Age;Behavior Pattern;Social Background;Time since onset of injury/illness/exacerbation;Fitness    Comorbidities paranoid schizophrenia, diabetes type II, recent HgbA1c approx 8, COPD, HTN, HLD, obesity, mild cognitive impairment, Breast CA (L) and masectomy, vit D deficiency    Examination-Activity Limitations Stairs;Stand;Transfers;Locomotion Level;Bend;Dressing    Examination-Participation Restrictions Church;Community Activity    Stability/Clinical Decision Making Evolving/Moderate complexity    Rehab Potential Good    PT Frequency 2x / week    PT Duration 8 weeks    PT Treatment/Interventions ADLs/Self Care Home Management;Stair training;Gait training;Functional mobility training;Therapeutic activities;Balance training;DME Instruction;Therapeutic exercise;Patient/family education;Neuromuscular re-education;Orthotic Fit/Training;Aquatic Therapy;Cognitive remediation    PT Next Visit Plan LUE RESTRICTED FOR BP;  Will be transitioning to [facility] first week of January.  Will continue with therapy through end of January - need to schedule more visits- sister has been called to work on this. Continue to work on [employer], weight shifitng, balance with narrow base of support, foot clearance with gait and LE strengthening.    PT Home Exercise Plan Safe strengthening and balance    Consulted and Agree with Plan of Care Patient;Other (Comment)   Sister          Patient will benefit from skilled therapeutic intervention in order to improve the following deficits and impairments:  Abnormal gait,Decreased coordination,Decreased activity tolerance,Decreased cognition,Decreased mobility,Decreased strength,Decreased knowledge of use of DME,Decreased balance,Decreased safety  awareness,Difficulty walking,Decreased range of motion,Decreased endurance  Visit Diagnosis: Unsteadiness on feet  Other abnormalities of gait and mobility  Muscle weakness (generalized)     Problem List Patient Active Problem List   Diagnosis Date Noted  . Cognitive impairment 02/19/2020  . Breast cancer of upper-outer quadrant of left female breast ([place]) 05/14/2011  . Vitamin D deficiency 04/02/2011  . DIASTOLIC HEART FAILURE, CHRONIC 07/10/2009  . DIABETES MELLITUS, TYPE II 06/18/2009  . HYPERLIPIDEMIA 06/18/2009  . SCHIZOPHRENIA 06/18/2009  . HYPERTENSION 06/18/2009  . MI 06/18/2009  . RESPIRATORY FAILURE, ACUTE 06/18/2009  . DYSPNEA ON EXERTION 06/18/2009    Alda Lea, PT 04/19/2020, 12:56 PM  [facility] [address] [place], [place], [address] Phone: [phone]   Fax:  [phone]  Name: Alexandra Lamb MRN: [id number] Date of Birth: 1945-05-22

## 2020-04-22 ENCOUNTER — Ambulatory Visit: Payer: Medicare Other | Admitting: Physical Therapy

## 2020-04-23 ENCOUNTER — Encounter: Payer: Self-pay | Admitting: Physical Therapy

## 2020-04-23 ENCOUNTER — Ambulatory Visit: Payer: Medicare Other | Attending: Family Medicine | Admitting: Physical Therapy

## 2020-04-23 ENCOUNTER — Other Ambulatory Visit: Payer: Self-pay

## 2020-04-23 DIAGNOSIS — M6281 Muscle weakness (generalized): Secondary | ICD-10-CM

## 2020-04-23 DIAGNOSIS — R2681 Unsteadiness on feet: Secondary | ICD-10-CM | POA: Diagnosis not present

## 2020-04-23 DIAGNOSIS — R2689 Other abnormalities of gait and mobility: Secondary | ICD-10-CM

## 2020-04-23 NOTE — Therapy (Signed)
[facility] [address] [place], [place], [address] Phone: [phone]   Fax:  [phone]  Physical Therapy Treatment  Patient Details  Name: Alexandra Lamb MRN: [id number] Date of Birth: 22-Mar-1946 Referring Provider (PT): Dr. Harlan Stains, MD   Encounter Date: 04/23/2020   PT End of Session - 04/23/20 1528    Visit Number 14    Number of Visits 17    Date for PT Re-Evaluation 04/29/20    Authorization Type Medicare and BCBS Supplement; 10th visit PN    Progress Note Due on Visit 20    PT Start Time 1100    PT Stop Time 1156    PT Time Calculation (min) 56 min    Equipment Utilized During Treatment Gait belt    Activity Tolerance Patient tolerated treatment well    Behavior During Therapy WFL for tasks assessed/performed           Past Medical History:  Diagnosis Date  . Cancer ([place])    left breast  . Diabetes mellitus   . Hyperlipidemia   . Hypertension   . MCI (mild cognitive impairment)   . Schizophrenia ([place])    paranoid    Past Surgical History:  Procedure Laterality Date  . BREAST SURGERY  2011   left breast mast  . MASTECTOMY     left  . TOOTH EXTRACTION      There were no vitals filed for this visit.   Subjective Assessment - 04/23/20 1501    Subjective Pt accompanied to PT by her sister, Lelon Frohlich; pt is moving to [facility] on Friday this week.  [name] reports pt walked up steps using step over step sequence yesterday and says she has not seen her do that in a very long time - pt usually uses a step by step sequence. [name] reports pt appears to be more confident in her walking/mobility    Pertinent History PMH: paranoid schizophrenia, diabetes type II, recent HgbA1c approx 8, COPD, HTN, HLD, obesity, mild cognitive impairment, Breast CA (L) and masectomy, vit D deficiency    Limitations Standing;Walking;Lifting;House hold activities    Patient Stated Goals improve balance    Currently in  Pain? No/denies                             [facility] Adult PT Treatment/Exercise - 04/23/20 1116      Transfers   Transfers Sit to Stand;Stand to Sit    Sit to Stand 5: Supervision;4: Min guard    Stand to Sit 5: Supervision    Comments feet on blue Airex; 5 reps without UE support;  pt had no difficulty performing sit to stand from high/low mat table with feet on floor without UE support      Ambulation/Gait   Ambulation/Gait Yes    Ambulation/Gait Assistance 5: Supervision    Ambulation/Gait Assistance Details cues for increased heel strike, increased arm swing and to elevate Lt shoulder    Ambulation Distance (Feet) 230 Feet    Assistive device None    Gait Pattern Step-through pattern    Ambulation Surface Level;Indoor    Ramp 5: Supervision   CGA   Ramp Details (indicate cue type and reason) no device used - CGA only for safety      Knee/Hip Exercises: Aerobic   Nustep Level 4.0 with UE's & LE's for 6"      Knee/Hip Exercises: Standing   Hip  Flexion Stengthening;Right;Left;Knee straight;Knee bent;2 sets;10 reps   1 set 10 reps each with 3# weight-- knee flexed & knee extended   Hip Abduction Stengthening;Right;Left;1 set;15 reps;Knee straight   3# weight   Hip Extension Stengthening;Right;Left;1 set;10 reps;Knee straight   3# weight              Balance Exercises - 04/23/20 0001      Balance Exercises: Standing   Marching Foam/compliant surface;Static;10 reps   with mod hand held assist due to pt's fear of falling   Other Standing Exercises Pt performed cone taps to 2 cones 3 reps x 2 sets with each foot with min HHA    Other Standing Exercises Comments Pt performed obstacle course negotiation with stepping over orange hurdles and black balance beams (lying on floor, progressing to placing on side for higher height); pt initially used min hand held assist and then performed 2 reps without HHA, with only CGA - this activity was performed to simulate pt  stepping over approx. 3" height into her shower stall at her home - pt has been fearful of falling with stepping over into shower (grab bars are available)             PT Education - 04/23/20 1923    Education Details reissued copy of HEP    Person(s) Educated Patient;Caregiver(s)    Methods Explanation;Handout    Comprehension Verbalized understanding            PT Short Term Goals - 04/23/20 1144      PT SHORT TERM GOAL #1   Title Patient will reduce Five time Sit to Stand time by 3 seconds without hands from standard surface height in order to decrease risk for falls.    Baseline 03/27/20: 13.59 sec's no UE support from standard height chair, decreased, just not to goal level.    Status Partially Met      PT SHORT TERM GOAL #2   Title Patient will improve BERG balance score by 5 points for CDC and improvement in static and dynamic balance.    Baseline 03/27/20: 43/56 scored today (increased by 10 points)    Status Achieved      PT SHORT TERM GOAL #3   Title Patient will trial use of a straight cane vs RW for >300' using supervision assist.    Baseline 03/27/20: 440 feet today with no AD with supervision on indoors surfaces    Status Achieved    Target Date 03/30/20      PT SHORT TERM GOAL #4   Title Pt will demonstrate ability to perform initial HEP with supervision and staff will indicate that pt has performed atleast 3 days/week    Status Achieved    Target Date 03/30/20      PT SHORT TERM GOAL #5   Title Pt will participate in assessment of falls risk during gait with DGI    Baseline performed on 12/13: 14/24    Time 4    Period Weeks    Status Achieved    Target Date 03/30/20             PT Long Term Goals - 04/23/20 1145      PT LONG TERM GOAL #1   Title Pt will increase Berg score to >/= 41/56 for decreased fall risk.  (LTG due by 04/30/2019)    Baseline 33/56 02/29/20;  43/56 on 03-27-20    Time 8    Period Weeks    Status New  PT LONG TERM GOAL  #2   Title Patient will increase DGI score by 4 points for significant change in dynamic balance    Baseline 14/24    Time 8    Period Weeks    Status Revised      PT LONG TERM GOAL #3   Title Pt will ambulate 800+ feet with LRAD at mod-I for increased independence with functional mobility,    Baseline 100' walking stick with CGA    Time 8    Period Weeks    Status New      PT LONG TERM GOAL #4   Title Pt will negotiate 12 stairs with one rails and LRAD safely with supervision    Time 8    Period Weeks    Status New      PT LONG TERM GOAL #5   Title Pt will be able to perform final HEP with staff's supervision and will have re-started community exercise/wellness with group home    Time 8    Period Weeks    Status New                 Plan - 04/23/20 1533    Clinical Impression Statement Pt is improving with maintaining balance and also improving with ambulation - pt carried walking stick into clinic gym but did not use  - appears to carry it for security.  Pt continues to need verbal cues for improved gait pattern to increase step length with increased heel strike to reduce foot flat with slightly shuffling gait pattern which pt exhibits at times.  Cont with POC.    Personal Factors and Comorbidities Comorbidity 3+;Age;Behavior Pattern;Social Background;Time since onset of injury/illness/exacerbation;Fitness    Comorbidities paranoid schizophrenia, diabetes type II, recent HgbA1c approx 8, COPD, HTN, HLD, obesity, mild cognitive impairment, Breast CA (L) and masectomy, vit D deficiency    Examination-Activity Limitations Stairs;Stand;Transfers;Locomotion Level;Bend;Dressing    Examination-Participation Restrictions Church;Community Activity    Stability/Clinical Decision Making Evolving/Moderate complexity    Rehab Potential Good    PT Frequency 2x / week    PT Duration 8 weeks    PT Treatment/Interventions ADLs/Self Care Home Management;Stair training;Gait  training;Functional mobility training;Therapeutic activities;Balance training;DME Instruction;Therapeutic exercise;Patient/family education;Neuromuscular re-education;Orthotic Fit/Training;Aquatic Therapy;Cognitive remediation    PT Next Visit Plan Check LTG's and renew - Continue to work on SLS, weight shifitng, balance with narrow base of support, foot clearance with gait and LE strengthening.    PT Home Exercise Plan Safe strengthening and balance    Consulted and Agree with Plan of Care Patient;Other (Comment)   Sister          Patient will benefit from skilled therapeutic intervention in order to improve the following deficits and impairments:  Abnormal gait,Decreased coordination,Decreased activity tolerance,Decreased cognition,Decreased mobility,Decreased strength,Decreased knowledge of use of DME,Decreased balance,Decreased safety awareness,Difficulty walking,Decreased range of motion,Decreased endurance  Visit Diagnosis: Other abnormalities of gait and mobility  Unsteadiness on feet  Muscle weakness (generalized)     Problem List Patient Active Problem List   Diagnosis Date Noted  . Cognitive impairment 02/19/2020  . Breast cancer of upper-outer quadrant of left female breast ([place]) 05/14/2011  . Vitamin D deficiency 04/02/2011  . DIASTOLIC HEART FAILURE, CHRONIC 07/10/2009  . DIABETES MELLITUS, TYPE II 06/18/2009  . HYPERLIPIDEMIA 06/18/2009  . SCHIZOPHRENIA 06/18/2009  . HYPERTENSION 06/18/2009  . MI 06/18/2009  . RESPIRATORY FAILURE, ACUTE 06/18/2009  . DYSPNEA ON EXERTION 06/18/2009    Alda Lea, PT 04/23/2020, 7:24 PM  [facility] [address] [place], [place], [address] Phone: [phone]   Fax:  [phone]  Name: Alexandra Lamb MRN: [id number] Date of Birth: [birth year]

## 2020-04-24 DIAGNOSIS — Z111 Encounter for screening for respiratory tuberculosis: Secondary | ICD-10-CM | POA: Diagnosis not present

## 2020-04-25 ENCOUNTER — Ambulatory Visit: Payer: Medicare Other | Admitting: Physical Therapy

## 2020-04-25 ENCOUNTER — Other Ambulatory Visit: Payer: Self-pay

## 2020-04-25 DIAGNOSIS — M6281 Muscle weakness (generalized): Secondary | ICD-10-CM | POA: Diagnosis not present

## 2020-04-25 DIAGNOSIS — R2689 Other abnormalities of gait and mobility: Secondary | ICD-10-CM | POA: Diagnosis not present

## 2020-04-25 DIAGNOSIS — R2681 Unsteadiness on feet: Secondary | ICD-10-CM

## 2020-04-26 ENCOUNTER — Ambulatory Visit: Payer: Medicare Other | Admitting: Physical Therapy

## 2020-04-26 ENCOUNTER — Encounter: Payer: Self-pay | Admitting: Physical Therapy

## 2020-04-26 NOTE — Therapy (Signed)
[facility] [address] [place], [place], [address] Phone: [phone]   Fax:  [phone]  Physical Therapy Treatment  Patient Details  Name: Alexandra Lamb MRN: [id number] Date of Birth: 24-Aug-1945 Referring Provider (PT): Dr. Harlan Stains, MD   Encounter Date: 04/25/2020   PT End of Session - 04/26/20 1320    Visit Number 15    Number of Visits 17    Date for PT Re-Evaluation 04/29/20    Authorization Type Medicare and BCBS Supplement; 10th visit PN    Progress Note Due on Visit 20    PT Start Time 0934    PT Stop Time 1018    PT Time Calculation (min) 44 min    Equipment Utilized During Treatment Gait belt    Activity Tolerance Patient tolerated treatment well    Behavior During Therapy [facility] for tasks assessed/performed           Past Medical History:  Diagnosis Date  . Cancer ([place])    left breast  . Diabetes mellitus   . Hyperlipidemia   . Hypertension   . MCI (mild cognitive impairment)   . Schizophrenia ([place])    paranoid    Past Surgical History:  Procedure Laterality Date  . BREAST SURGERY  2011   left breast mast  . MASTECTOMY     left  . TOOTH EXTRACTION      There were no vitals filed for this visit.   Subjective Assessment - 04/25/20 0938    Subjective Pt reports no changes since previous PT session on Tuesday this week; sister states that the plan is still for pt to be moving to [facility] on Friday, 04-26-20    Pertinent History PMH: paranoid schizophrenia, diabetes type II, recent HgbA1c approx 8, COPD, HTN, HLD, obesity, mild cognitive impairment, Breast CA (L) and masectomy, vit D deficiency    Limitations Standing;Walking;Lifting;House hold activities    Patient Stated Goals improve balance    Currently in Pain? No/denies                             [facility] Adult PT Treatment/Exercise - 04/26/20 0001      Transfers   Transfers Sit to Stand    Sit to  Stand 5: Supervision;4: Min guard    Stand to Sit 5: Supervision    Number of Reps Other reps (comment)   5 reps - no UE support   Comments feet on blue Airex; 5 reps without UE support;  pt had no difficulty performing sit to stand from high/low mat table with feet on floor without UE support      Ambulation/Gait   Ambulation/Gait Yes    Ambulation/Gait Assistance 5: Supervision    Ambulation/Gait Assistance Details cues for increase heel strike and increased step length    Ambulation Distance (Feet) 250 Feet    Assistive device None    Gait Pattern Step-through pattern    Ambulation Surface Level;Indoor      Therapeutic Activites    Therapeutic Activities Other Therapeutic Activities    Other Therapeutic Activities Pt performed floor transfer - transferred from standing to tall kneeling with bil. UE support on mat table, then to long sitting position on floor; pt was given demonstrational cues to transfer from long sitting to quadruped and min assist to lift hips from floor to quadruped      Knee/Hip Exercises: Aerobic   Nustep Level 4.0  with UE's & LE's for 5"      Knee/Hip Exercises: Standing   Hip Flexion Stengthening;Right;Left;Knee straight;Knee bent;2 sets;10 reps   1 set 10 reps each with 3# weight-- knee flexed & knee extended   Hip Abduction Stengthening;Right;Left;1 set;15 reps;Knee straight   3# weight   Hip Extension Stengthening;Right;Left;1 set;10 reps;Knee straight   3# weight         Pt able to transfer floor to stand with bil. UE support on mat in tall kneeling - to 1/2 kneeling position - to standing with CGA  (Min assist to transition from long sitting to quadruped position)   Balance Exercises - 04/26/20 0001      Balance Exercises: Standing   Stepping Strategy Anterior;Posterior;Lateral;10 reps   standing on blue mat with mod hand held assist   Rockerboard Anterior/posterior;10 reps;EO;UE support   with min to mod assist due to pt's fear of falling    Marching Foam/compliant surface;Static;10 reps   with mod hand held assist due to pt's fear of falling   Other Standing Exercises pt performed amb. carrying laundry basket 115' around gym, amb. between mat tables for maneuvering in tight spaces while carrying object for improved multi tasking with gait               PT Short Term Goals - 04/26/20 1322      PT SHORT TERM GOAL #1   Title Patient will reduce Five time Sit to Stand time by 3 seconds without hands from standard surface height in order to decrease risk for falls.    Baseline 03/27/20: 13.59 sec's no UE support from standard height chair, decreased, just not to goal level.    Status Partially Met      PT SHORT TERM GOAL #2   Title Patient will improve BERG balance score by 5 points for CDC and improvement in static and dynamic balance.    Baseline 03/27/20: 43/56 scored today (increased by 10 points)    Status Achieved      PT SHORT TERM GOAL #3   Title Patient will trial use of a straight cane vs RW for >300' using supervision assist.    Baseline 03/27/20: 440 feet today with no AD with supervision on indoors surfaces    Status Achieved    Target Date 03/30/20      PT SHORT TERM GOAL #4   Title Pt will demonstrate ability to perform initial HEP with supervision and staff will indicate that pt has performed atleast 3 days/week    Status Achieved    Target Date 03/30/20      PT SHORT TERM GOAL #5   Title Pt will participate in assessment of falls risk during gait with DGI    Baseline performed on 12/13: 14/24    Time 4    Period Weeks    Status Achieved    Target Date 03/30/20             PT Long Term Goals - 04/26/20 1323      PT LONG TERM GOAL #1   Title Pt will increase Berg score to >/= 41/56 for decreased fall risk.  (LTG due by 04/30/2019)    Baseline 33/56 02/29/20;  43/56 on 03-27-20    Time 8    Period Weeks    Status New      PT LONG TERM GOAL #2   Title Patient will increase DGI score by 4 points  for significant change in dynamic balance  Baseline 14/24    Time 8    Period Weeks    Status Revised      PT LONG TERM GOAL #3   Title Pt will ambulate 800+ feet with LRAD at mod-I for increased independence with functional mobility,    Baseline 100' walking stick with CGA    Time 8    Period Weeks    Status New      PT LONG TERM GOAL #4   Title Pt will negotiate 12 stairs with one rails and LRAD safely with supervision    Time 8    Period Weeks    Status New      PT LONG TERM GOAL #5   Title Pt will be able to perform final HEP with staff's supervision and will have re-started community exercise/wellness with group home    Time 8    Period Weeks    Status New                 Plan - 04/26/20 1321    Clinical Impression Statement Pt did very well with floor to stand transfer, with only CGA needed for safety with pt using bil. UE support on mat.  Pt also did well with amb. in crowded spaces and around environmental objects  - had no LOB with amb. in tight spaces.  Cont with POC.    Personal Factors and Comorbidities Comorbidity 3+;Age;Behavior Pattern;Social Background;Time since onset of injury/illness/exacerbation;Fitness    Comorbidities paranoid schizophrenia, diabetes type II, recent HgbA1c approx 8, COPD, HTN, HLD, obesity, mild cognitive impairment, Breast CA (L) and masectomy, vit D deficiency    Examination-Activity Limitations Stairs;Stand;Transfers;Locomotion Level;Bend;Dressing    Examination-Participation Restrictions Church;Community Activity    Stability/Clinical Decision Making Evolving/Moderate complexity    Rehab Potential Good    PT Frequency 2x / week    PT Duration 8 weeks    PT Treatment/Interventions ADLs/Self Care Home Management;Stair training;Gait training;Functional mobility training;Therapeutic activities;Balance training;DME Instruction;Therapeutic exercise;Patient/family education;Neuromuscular re-education;Orthotic Fit/Training;Aquatic  Therapy;Cognitive remediation    PT Next Visit Plan Check LTG's and renew - Continue to work on SLS, weight shifitng, balance with narrow base of support, foot clearance with gait and LE strengthening.    PT Home Exercise Plan Safe strengthening and balance    Consulted and Agree with Plan of Care Patient;Other (Comment)   Sister          Patient will benefit from skilled therapeutic intervention in order to improve the following deficits and impairments:  Abnormal gait,Decreased coordination,Decreased activity tolerance,Decreased cognition,Decreased mobility,Decreased strength,Decreased knowledge of use of DME,Decreased balance,Decreased safety awareness,Difficulty walking,Decreased range of motion,Decreased endurance  Visit Diagnosis: Other abnormalities of gait and mobility  Unsteadiness on feet  Muscle weakness (generalized)     Problem List Patient Active Problem List   Diagnosis Date Noted  . Cognitive impairment 02/19/2020  . Breast cancer of upper-outer quadrant of left female breast ([place]) 05/14/2011  . Vitamin D deficiency 04/02/2011  . DIASTOLIC HEART FAILURE, CHRONIC 07/10/2009  . DIABETES MELLITUS, TYPE II 06/18/2009  . HYPERLIPIDEMIA 06/18/2009  . SCHIZOPHRENIA 06/18/2009  . HYPERTENSION 06/18/2009  . MI 06/18/2009  . RESPIRATORY FAILURE, ACUTE 06/18/2009  . DYSPNEA ON EXERTION 06/18/2009    Alda Lea, PT 04/26/2020, 1:25 PM  [facility] [address] [place], [place], [address] Phone: [phone]   Fax:  [phone]  Name: Alexandra Lamb MRN: [id number] Date of Birth: 1946/02/04

## 2020-04-29 ENCOUNTER — Ambulatory Visit: Payer: Medicare Other | Admitting: Physical Therapy

## 2020-04-29 ENCOUNTER — Encounter: Payer: Self-pay | Admitting: Physical Therapy

## 2020-04-29 ENCOUNTER — Other Ambulatory Visit: Payer: Self-pay

## 2020-04-29 DIAGNOSIS — M6281 Muscle weakness (generalized): Secondary | ICD-10-CM

## 2020-04-29 DIAGNOSIS — R2689 Other abnormalities of gait and mobility: Secondary | ICD-10-CM

## 2020-04-29 DIAGNOSIS — R2681 Unsteadiness on feet: Secondary | ICD-10-CM | POA: Diagnosis not present

## 2020-04-29 NOTE — Therapy (Signed)
[facility] [address] [place], [place], [address] Phone: [phone]   Fax:  [phone]  Physical Therapy Treatment  Patient Details  Name: Alexandra Lamb MRN: [id number] Date of Birth: 01/17/1946 Referring Provider (PT): Dr. Harlan Stains, MD   Encounter Date: 04/29/2020   PT End of Session - 04/29/20 2022    Visit Number 16    Number of Visits 17    Date for PT Re-Evaluation 04/29/20    Authorization Type Medicare and BCBS Supplement; 10th visit PN    Progress Note Due on Visit 20    PT Start Time 1113   pt arrived late   PT Stop Time 1159    PT Time Calculation (min) 46 min    Activity Tolerance Patient tolerated treatment well    Behavior During Therapy [facility] for tasks assessed/performed           Past Medical History:  Diagnosis Date  . Cancer ([place])    left breast  . Diabetes mellitus   . Hyperlipidemia   . Hypertension   . MCI (mild cognitive impairment)   . Schizophrenia ([place])    paranoid    Past Surgical History:  Procedure Laterality Date  . BREAST SURGERY  2011   left breast mast  . MASTECTOMY     left  . TOOTH EXTRACTION      There were no vitals filed for this visit.   Subjective Assessment - 04/29/20 1947    Subjective Pt accompanied to PT by her sister, Lelon Frohlich - pt moved to [facility] last Friday    Pertinent History PMH: paranoid schizophrenia, diabetes type II, recent HgbA1c approx 8, COPD, HTN, HLD, obesity, mild cognitive impairment, Breast CA (L) and masectomy, vit D deficiency    Limitations Standing;Walking;Lifting;House hold activities    Patient Stated Goals improve balance    Currently in Pain? No/denies                             [facility] Adult PT Treatment/Exercise - 04/29/20 1132      Transfers   Transfers Sit to Stand    Sit to Stand 5: Supervision    Number of Reps Other reps (comment)   3   Comments feet on floor      Ambulation/Gait    Ambulation/Gait Yes    Ambulation/Gait Assistance 5: Supervision    Ambulation/Gait Assistance Details cues for increased step length and arm swing    Ambulation Distance (Feet) 250 Feet    Assistive device None    Gait Pattern Step-through pattern    Ambulation Surface Level;Indoor    Stairs Yes    Stairs Assistance 5: Supervision    Number of Stairs 4    Height of Stairs 6    Ramp 5: Supervision    Curb Other (comment)   min HHA     Knee/Hip Exercises: Aerobic   Nustep Level 4.0 with UE's & LE's for 7"      Knee/Hip Exercises: Standing   Forward Step Up Both;1 set;10 reps;Step Height: 6";Hand Hold: 2               Balance Exercises - 04/29/20 0001      Balance Exercises: Standing   Rockerboard Anterior/posterior;10 reps;EO;UE support   with min to mod assist due to pt's fear of falling   Balance Beam standing on balance beam for improved hip strategy- with UE support  Marching Foam/compliant surface;Static;10 reps   with mod hand held assist due to pt's fear of falling   Other Standing Exercises pt performed stepping over and back of balance beam 5 reps each foot with 1 UE support on // bar    Other Standing Exercises Comments sidestepping 10' x 2 reps with squats inside // bars without UE support             PT Education - 04/29/20 2021    Education Details recommended pt to walk in her hallway at new living facility [facility]) 2 laps (4 lengths) 2x/day    Person(s) Educated Patient    Methods Explanation    Comprehension Verbalized understanding            PT Short Term Goals - 04/29/20 2024      PT SHORT TERM GOAL #1   Title Patient will reduce Five time Sit to Stand time by 3 seconds without hands from standard surface height in order to decrease risk for falls.    Baseline 03/27/20: 13.59 sec's no UE support from standard height chair, decreased, just not to goal level.    Status Partially Met      PT SHORT TERM GOAL #2   Title Patient will  improve BERG balance score by 5 points for CDC and improvement in static and dynamic balance.    Baseline 03/27/20: 43/56 scored today (increased by 10 points)    Status Achieved      PT SHORT TERM GOAL #3   Title Patient will trial use of a straight cane vs RW for >300' using supervision assist.    Baseline 03/27/20: 440 feet today with no AD with supervision on indoors surfaces    Status Achieved    Target Date 03/30/20      PT SHORT TERM GOAL #4   Title Pt will demonstrate ability to perform initial HEP with supervision and staff will indicate that pt has performed atleast 3 days/week    Status Achieved    Target Date 03/30/20      PT SHORT TERM GOAL #5   Title Pt will participate in assessment of falls risk during gait with DGI    Baseline performed on 12/13: 14/24    Time 4    Period Weeks    Status Achieved    Target Date 03/30/20             PT Long Term Goals - 04/29/20 2025      PT LONG TERM GOAL #1   Title Pt will increase Berg score to >/= 41/56 for decreased fall risk.  (LTG due by 04/30/2019)    Baseline 33/56 02/29/20;  43/56 on 03-27-20    Time 8    Period Weeks    Status New      PT LONG TERM GOAL #2   Title Patient will increase DGI score by 4 points for significant change in dynamic balance    Baseline 14/24    Time 8    Period Weeks    Status Revised      PT LONG TERM GOAL #3   Title Pt will ambulate 800+ feet with LRAD at mod-I for increased independence with functional mobility,    Baseline 100' walking stick with CGA    Time 8    Period Weeks    Status New      PT LONG TERM GOAL #4   Title Pt will negotiate 12 stairs with one rails and LRAD  safely with supervision    Time 8    Period Weeks    Status New      PT LONG TERM GOAL #5   Title Pt will be able to perform final HEP with staff's supervision and will have re-started community exercise/wellness with group home    Time 8    Period Weeks    Status New                 Plan -  04/29/20 2024    Clinical Impression Statement Pt is progressing towards goals; did well with ramp negotiation but needs hand held assist with curb negotiation, partially due to visual deficits (difficulty with depth perception?  - as pt removed eye glasses prior to stepping down off of curb):  pt remains fearful of falling with standing on compliant surfaces without bil. UE support    Personal Factors and Comorbidities Comorbidity 3+;Age;Behavior Pattern;Social Background;Time since onset of injury/illness/exacerbation;Fitness    Comorbidities paranoid schizophrenia, diabetes type II, recent HgbA1c approx 8, COPD, HTN, HLD, obesity, mild cognitive impairment, Breast CA (L) and masectomy, vit D deficiency    Examination-Activity Limitations Stairs;Stand;Transfers;Locomotion Level;Bend;Dressing    Examination-Participation Restrictions Church;Community Activity    Stability/Clinical Decision Making Evolving/Moderate complexity    Rehab Potential Good    PT Frequency 2x / week    PT Duration 8 weeks    PT Treatment/Interventions ADLs/Self Care Home Management;Stair training;Gait training;Functional mobility training;Therapeutic activities;Balance training;DME Instruction;Therapeutic exercise;Patient/family education;Neuromuscular re-education;Orthotic Fit/Training;Aquatic Therapy;Cognitive remediation    PT Next Visit Plan Check LTG's and renew - Continue to work on SLS, weight shifitng, balance with narrow base of support, foot clearance with gait and LE strengthening.    PT Home Exercise Plan Safe strengthening and balance    Consulted and Agree with Plan of Care Patient;Other (Comment)   Sister          Patient will benefit from skilled therapeutic intervention in order to improve the following deficits and impairments:  Abnormal gait,Decreased coordination,Decreased activity tolerance,Decreased cognition,Decreased mobility,Decreased strength,Decreased knowledge of use of DME,Decreased  balance,Decreased safety awareness,Difficulty walking,Decreased range of motion,Decreased endurance  Visit Diagnosis: Other abnormalities of gait and mobility  Unsteadiness on feet  Muscle weakness (generalized)     Problem List Patient Active Problem List   Diagnosis Date Noted  . Cognitive impairment 02/19/2020  . Breast cancer of upper-outer quadrant of left female breast ([place]) 05/14/2011  . Vitamin D deficiency 04/02/2011  . DIASTOLIC HEART FAILURE, CHRONIC 07/10/2009  . DIABETES MELLITUS, TYPE II 06/18/2009  . HYPERLIPIDEMIA 06/18/2009  . SCHIZOPHRENIA 06/18/2009  . HYPERTENSION 06/18/2009  . MI 06/18/2009  . RESPIRATORY FAILURE, ACUTE 06/18/2009  . DYSPNEA ON EXERTION 06/18/2009    Alda Lea, PT 04/29/2020, 8:43 PM  [facility] [address] [place], [place], [address] Phone: [phone]   Fax:  [phone]  Name: Alexandra Lamb MRN: [id number] Date of Birth: [birth year]

## 2020-05-02 ENCOUNTER — Ambulatory Visit: Payer: Medicare Other | Admitting: Physical Therapy

## 2020-05-02 ENCOUNTER — Other Ambulatory Visit: Payer: Self-pay

## 2020-05-02 DIAGNOSIS — M6281 Muscle weakness (generalized): Secondary | ICD-10-CM

## 2020-05-02 DIAGNOSIS — R2689 Other abnormalities of gait and mobility: Secondary | ICD-10-CM | POA: Diagnosis not present

## 2020-05-02 DIAGNOSIS — R2681 Unsteadiness on feet: Secondary | ICD-10-CM

## 2020-05-03 NOTE — Therapy (Signed)
[facility] [address] [place], [place], [address] Phone: [phone]   Fax:  [phone]  Physical Therapy Treatment  Patient Details  Name: Alexandra Lamb MRN: [id number] Date of Birth: [birth year] Referring Provider (PT): Dr. Harlan Stains, MD   Encounter Date: 05/02/2020   PT End of Session - 05/03/20 1427    Visit Number 17    Number of Visits 23    Date for PT Re-Evaluation 05/24/20    Authorization Type Medicare and BCBS Supplement; 10th visit PN    Progress Note Due on Visit 20    PT Start Time 1020    PT Stop Time 1105    PT Time Calculation (min) 45 min    Equipment Utilized During Treatment Gait belt    Activity Tolerance Patient tolerated treatment well    Behavior During Therapy WFL for tasks assessed/performed           Past Medical History:  Diagnosis Date  . Cancer ([place])    left breast  . Diabetes mellitus   . Hyperlipidemia   . Hypertension   . MCI (mild cognitive impairment)   . Schizophrenia ([place])    paranoid    Past Surgical History:  Procedure Laterality Date  . BREAST SURGERY  2011   left breast mast  . MASTECTOMY     left  . TOOTH EXTRACTION      There were no vitals filed for this visit.   Subjective Assessment - 05/03/20 1420    Subjective Pt states she is getting used to her new living situation - sister reports she is still fearful to stand in shower without holding onto grab bar (which is on her left side in the shower)    Pertinent History PMH: paranoid schizophrenia, diabetes type II, recent HgbA1c approx 8, COPD, HTN, HLD, obesity, mild cognitive impairment, Breast CA (L) and masectomy, vit D deficiency    Limitations Standing;Walking;Lifting;House hold activities    Patient Stated Goals improve balance    Currently in Pain? No/denies               TherAct. = pt picked up various objects off floor - cones, small container and a plastic box - placed on mat  table with return to Standing after picking up each object - activity to simulate pt bending down to retrieve objects, clothes, etc. out of drawer in her new Living environment (Assisted Living facility)  Pt performed diagonal reaching with small yellow medicine ball - 5 reps to each side - reaching down toward one foot and returning to upright With trunk rotation with CGA and tactile cues for specific desired movement      Pt performed standing on stepping stones (6 used) of various heights for improved standing balance - with min to mod HHA          [facility] Adult PT Treatment/Exercise - 05/03/20 0001      Ambulation/Gait   Ambulation/Gait Yes    Ambulation/Gait Assistance 5: Supervision    Ambulation Distance (Feet) 250 Feet    Assistive device None    Gait Pattern Step-through pattern      Therapeutic Activites    Therapeutic Activities Other Therapeutic Activities    Other Therapeutic Activities Pt performed floor transfer - transferred from standing to tall kneeling with bil. UE support on mat table, then to side sitting position on floor; pt was given verbal cues to transfer from side sitting to quadruped and min assist to  lift hips from floor to quadruped to tall kneeling with tactile cues to place hands on mat table for UE support with floor to stand transfer               Balance Exercises - 05/03/20 0001      Balance Exercises: Standing   Rockerboard Anterior/posterior;10 reps;EO;UE support   with min to mod assist due to pt's fear of falling   Marching Foam/compliant surface;Static;10 reps   with mod hand held assist due to pt's fear of falling   Other Standing Exercises pt performed stepping over and back of balance beam 5 reps each foot with 1 UE support on // bar               PT Short Term Goals - 05/03/20 1437      PT SHORT TERM GOAL #1   Title Patient will reduce Five time Sit to Stand time by 3 seconds without hands from standard surface height in  order to decrease risk for falls.    Baseline 03/27/20: 13.59 sec's no UE support from standard height chair, decreased, just not to goal level.    Status Partially Met      PT SHORT TERM GOAL #2   Title Patient will improve BERG balance score by 5 points for CDC and improvement in static and dynamic balance.    Baseline 03/27/20: 43/56 scored today (increased by 10 points)    Status Achieved      PT SHORT TERM GOAL #3   Title Patient will trial use of a straight cane vs RW for >300' using supervision assist.    Baseline 03/27/20: 440 feet today with no AD with supervision on indoors surfaces    Status Achieved    Target Date 03/30/20      PT SHORT TERM GOAL #4   Title Pt will demonstrate ability to perform initial HEP with supervision and staff will indicate that pt has performed atleast 3 days/week    Status Achieved    Target Date 03/30/20      PT SHORT TERM GOAL #5   Title Pt will participate in assessment of falls risk during gait with DGI    Baseline performed on 12/13: 14/24    Time 4    Period Weeks    Status Achieved    Target Date 03/30/20             PT Long Term Goals - 05/02/20 1017      PT LONG TERM GOAL #1   Title Pt will increase Berg score to >/= 46/56 for decreased fall risk.  (LTG due by 05/25/2019)    Baseline 33/56 02/29/20;  43/56 on 03-27-20    Time 3    Period Weeks    Status Revised    Target Date 05/24/20      PT LONG TERM GOAL #2   Title Patient will increase DGI score by 4 points for significant change in dynamic balance    Baseline 14/24    Time 3    Period Weeks    Status Revised    Target Date 05/24/20      PT LONG TERM GOAL #3   Title Pt will ambulate 500+ feet with LRAD at mod-I for increased independence with functional mobility,    Baseline 100' walking stick with CGA    Time 3    Period Weeks    Status Revised    Target Date 05/24/20  PT LONG TERM GOAL #4   Title Pt will negotiate 12 stairs with one rails and LRAD safely  with supervision    Baseline No steps at pt's new living environment - [facility]    Time 8    Period Weeks    Status Deferred      PT LONG TERM GOAL #5   Title Pt will be able to perform final HEP with staff's supervision and will have re-started community exercise/wellness with group home    Baseline Pt has moved from group home to Assisted Living facility - goal deferred    Time 8    Period Weeks    Status Deferred      Additional Long Term Goals   Additional Long Term Goals Yes      PT LONG TERM GOAL #6   Title Pt will step over 3" height object to simulate pt's entrance to shower with SBA (without HHA).    Time 3    Period Weeks    Status New    Target Date 05/24/20                 Plan - 05/03/20 1430    Clinical Impression Statement Today's skilled PT session focused on simulated functional activities to increase safety and confidence with mobility as pt is now living in [place] facility at [facility].  Pt able to step over objects with CGA (continues to have potential visual deficits, i.e. depth perception issues) which contribute to fear with stepping over objects and on/off curbs, steps, etc.  Pt did very well with activity of bending down to pick up objects off floor and return to standing.  Cont with POC.    Personal Factors and Comorbidities Comorbidity 3+;Age;Behavior Pattern;Social Background;Time since onset of injury/illness/exacerbation;Fitness    Comorbidities paranoid schizophrenia, diabetes type II, recent HgbA1c approx 8, COPD, HTN, HLD, obesity, mild cognitive impairment, Breast CA (L) and masectomy, vit D deficiency    Examination-Activity Limitations Stairs;Stand;Transfers;Locomotion Level;Bend;Dressing    Examination-Participation Restrictions Church;Community Activity    Stability/Clinical Decision Making Evolving/Moderate complexity    Rehab Potential Good    PT Frequency 2x / week    PT Duration 3 weeks    PT  Treatment/Interventions ADLs/Self Care Home Management;Stair training;Gait training;Functional mobility training;Therapeutic activities;Balance training;DME Instruction;Therapeutic exercise;Patient/family education;Neuromuscular re-education;Orthotic Fit/Training;Aquatic Therapy;Cognitive remediation    PT Next Visit Plan Continue balance & gait training    PT Home Exercise Plan Safe strengthening and balance    Consulted and Agree with Plan of Care Patient;Other (Comment)   Sister          Patient will benefit from skilled therapeutic intervention in order to improve the following deficits and impairments:  Abnormal gait,Decreased coordination,Decreased activity tolerance,Decreased cognition,Decreased mobility,Decreased strength,Decreased knowledge of use of DME,Decreased balance,Decreased safety awareness,Difficulty walking,Decreased range of motion,Decreased endurance  Visit Diagnosis: Other abnormalities of gait and mobility - Plan: PT plan of care cert/re-cert  Unsteadiness on feet - Plan: PT plan of care cert/re-cert  Muscle weakness (generalized) - Plan: PT plan of care cert/re-cert     Problem List Patient Active Problem List   Diagnosis Date Noted  . Cognitive impairment 02/19/2020  . Breast cancer of upper-outer quadrant of left female breast ([place]) 05/14/2011  . Vitamin D deficiency 04/02/2011  . DIASTOLIC HEART FAILURE, CHRONIC 07/10/2009  . DIABETES MELLITUS, TYPE II 06/18/2009  . HYPERLIPIDEMIA 06/18/2009  . SCHIZOPHRENIA 06/18/2009  . HYPERTENSION 06/18/2009  . MI 06/18/2009  . RESPIRATORY FAILURE, ACUTE 06/18/2009  .  DYSPNEA ON EXERTION 06/18/2009    Alexandra Lamb, PT 05/03/2020, 2:53 PM  [facility] [address] [place], [place], [address] Phone: [phone]   Fax:  [phone]  Name: Alexandra Lamb MRN: [id number] Date of Birth: 12/17/1945

## 2020-05-07 ENCOUNTER — Ambulatory Visit: Payer: Medicare Other | Admitting: Physical Therapy

## 2020-05-07 DIAGNOSIS — F200 Paranoid schizophrenia: Secondary | ICD-10-CM | POA: Diagnosis not present

## 2020-05-07 DIAGNOSIS — G3184 Mild cognitive impairment, so stated: Secondary | ICD-10-CM | POA: Diagnosis not present

## 2020-05-09 ENCOUNTER — Encounter: Payer: Self-pay | Admitting: Physical Therapy

## 2020-05-09 ENCOUNTER — Ambulatory Visit: Payer: Medicare Other | Admitting: Physical Therapy

## 2020-05-09 ENCOUNTER — Other Ambulatory Visit: Payer: Self-pay

## 2020-05-09 DIAGNOSIS — R2681 Unsteadiness on feet: Secondary | ICD-10-CM

## 2020-05-09 DIAGNOSIS — R2689 Other abnormalities of gait and mobility: Secondary | ICD-10-CM | POA: Diagnosis not present

## 2020-05-09 DIAGNOSIS — M6281 Muscle weakness (generalized): Secondary | ICD-10-CM

## 2020-05-09 NOTE — Therapy (Signed)
[facility] [address] [place], [place], [address] Phone: [phone]   Fax:  [phone]  Physical Therapy Treatment  Patient Details  Name: Alexandra Lamb MRN: [id number] Date of Birth: March 26, 1946 Referring Provider (PT): Dr. Harlan Stains, MD   Encounter Date: 05/09/2020   PT End of Session - 05/09/20 1914    Visit Number 18    Number of Visits 23    Date for PT Re-Evaluation 05/24/20    Authorization Type Medicare and BCBS Supplement; 10th visit PN    Progress Note Due on Visit 20    PT Start Time 1105    PT Stop Time 1152    PT Time Calculation (min) 47 min    Equipment Utilized During Treatment Gait belt    Activity Tolerance Patient tolerated treatment well    Behavior During Therapy WFL for tasks assessed/performed           Past Medical History:  Diagnosis Date  . Cancer ([place])    left breast  . Diabetes mellitus   . Hyperlipidemia   . Hypertension   . MCI (mild cognitive impairment)   . Schizophrenia ([place])    paranoid    Past Surgical History:  Procedure Laterality Date  . BREAST SURGERY  2011   left breast mast  . MASTECTOMY     left  . TOOTH EXTRACTION      There were no vitals filed for this visit.   Subjective Assessment - 05/09/20 1850    Subjective Pt's sister reports pt is becoming more confident in her mobility and balance - states she did her balance exercises with her yesterday and pt performed marching without holding onto anything for first time    Pertinent History PMH: paranoid schizophrenia, diabetes type II, recent HgbA1c approx 8, COPD, HTN, HLD, obesity, mild cognitive impairment, Breast CA (L) and masectomy, vit D deficiency    Limitations Standing;Walking;Lifting;House hold activities    Patient Stated Goals improve balance    Currently in Pain? No/denies                             [facility] Adult PT Treatment/Exercise - 05/09/20 0001       Transfers   Transfers Sit to Stand    Sit to Stand 5: Supervision    Number of Reps Other reps (comment)   3   Comments feet on floor      Ambulation/Gait   Ambulation/Gait Yes    Ambulation/Gait Assistance 5: Supervision    Ambulation/Gait Assistance Details carried crate with 5# in it for the 2 laps    Ambulation Distance (Feet) 230 Feet   pt carried crate with 5# weight in it while amb. the 2 laps   Assistive device None    Gait Pattern Step-through pattern    Ambulation Surface Level;Indoor    Stairs Yes    Stairs Assistance 5: Supervision    Number of Stairs 4    Height of Stairs 6      Knee/Hip Exercises: Aerobic   Nustep Level 4.0 with UE's & LE's for 6.5"          Neuro Re-ed:  Pt performed stepping on stepping stones of various heights with min to mod hand held assist for improved SLS on each leg      Balance Exercises - 05/09/20 0001      Balance Exercises: Standing   Marching Foam/compliant surface;Static;10 reps  with mod hand held assist due to pt's fear of falling   Other Standing Exercises Pt performed SLS activity - touching balance bubbles (2) with each foot with CGA : then pt stood on [employer] touching balance bubbles for improved SLS on compliant surface with min HHA    Other Standing Exercises Comments Pt performed cone taps to 3 cones with min to mod HHA - pt had some difficulty lifting leg high enough to tap the top of each cone without tipping it over          TherAct.; practiced bending down and picking up cones off floor, then returning to upright and turning 180 degrees to  Place each cone on cabinet shelf; pt then placed each cone from shelf back down onto floor     PT Short Term Goals - 05/09/20 1927      PT SHORT TERM GOAL #1   Title Patient will reduce Five time Sit to Stand time by 3 seconds without hands from standard surface height in order to decrease risk for falls.    Baseline 03/27/20: 13.59 sec's no UE support from standard height  chair, decreased, just not to goal level.    Status Partially Met      PT SHORT TERM GOAL #2   Title Patient will improve BERG balance score by 5 points for CDC and improvement in static and dynamic balance.    Baseline 03/27/20: 43/56 scored today (increased by 10 points)    Status Achieved      PT SHORT TERM GOAL #3   Title Patient will trial use of a straight cane vs RW for >300' using supervision assist.    Baseline 03/27/20: 440 feet today with no AD with supervision on indoors surfaces    Status Achieved    Target Date 03/30/20      PT SHORT TERM GOAL #4   Title Pt will demonstrate ability to perform initial HEP with supervision and staff will indicate that pt has performed atleast 3 days/week    Status Achieved    Target Date 03/30/20      PT SHORT TERM GOAL #5   Title Pt will participate in assessment of falls risk during gait with DGI    Baseline performed on 12/13: 14/24    Time 4    Period Weeks    Status Achieved    Target Date 03/30/20             PT Long Term Goals - 05/09/20 1927      PT LONG TERM GOAL #1   Title Pt will increase Berg score to >/= 46/56 for decreased fall risk.  (LTG due by 05/25/2019)    Baseline 33/56 02/29/20;  43/56 on 03-27-20    Time 3    Period Weeks    Status Revised      PT LONG TERM GOAL #2   Title Patient will increase DGI score by 4 points for significant change in dynamic balance    Baseline 14/24    Time 3    Period Weeks    Status Revised      PT LONG TERM GOAL #3   Title Pt will ambulate 500+ feet with LRAD at mod-I for increased independence with functional mobility,    Baseline 100' walking stick with CGA    Time 3    Period Weeks    Status Revised      PT LONG TERM GOAL #4   Title Pt will negotiate  12 stairs with one rails and LRAD safely with supervision    Baseline No steps at pt's new living environment - [facility]    Time 8    Period Weeks    Status Deferred      PT LONG TERM GOAL #5   Title Pt  will be able to perform final HEP with staff's supervision and will have re-started community exercise/wellness with group home    Baseline Pt has moved from group home to Assisted Living facility - goal deferred    Time 8    Period Weeks    Status Deferred      PT LONG TERM GOAL #6   Title Pt will step over 3" height object to simulate pt's entrance to shower with SBA (without HHA).    Time 3    Period Weeks    Status New                 Plan - 05/09/20 1916    Clinical Impression Statement Pt improving with balance and confidence with various activities including bending down to retrieve objects off floor and return to standing position.  Pt improves with practice and repetition of activities.  Pt amb. out of clinic gym to lobby at end of session without use of walking stick.  Cont with POC.    Personal Factors and Comorbidities Comorbidity 3+;Age;Behavior Pattern;Social Background;Time since onset of injury/illness/exacerbation;Fitness    Comorbidities paranoid schizophrenia, diabetes type II, recent HgbA1c approx 8, COPD, HTN, HLD, obesity, mild cognitive impairment, Breast CA (L) and masectomy, vit D deficiency    Examination-Activity Limitations Stairs;Stand;Transfers;Locomotion Level;Bend;Dressing    Examination-Participation Restrictions Church;Community Activity    Stability/Clinical Decision Making Evolving/Moderate complexity    Rehab Potential Good    PT Frequency 2x / week    PT Duration 3 weeks    PT Treatment/Interventions ADLs/Self Care Home Management;Stair training;Gait training;Functional mobility training;Therapeutic activities;Balance training;DME Instruction;Therapeutic exercise;Patient/family education;Neuromuscular re-education;Orthotic Fit/Training;Aquatic Therapy;Cognitive remediation    PT Next Visit Plan Continue balance & gait training    PT Home Exercise Plan Safe strengthening and balance    Consulted and Agree with Plan of Care Patient;Other (Comment)    Sister          Patient will benefit from skilled therapeutic intervention in order to improve the following deficits and impairments:  Abnormal gait,Decreased coordination,Decreased activity tolerance,Decreased cognition,Decreased mobility,Decreased strength,Decreased knowledge of use of DME,Decreased balance,Decreased safety awareness,Difficulty walking,Decreased range of motion,Decreased endurance  Visit Diagnosis: Other abnormalities of gait and mobility  Unsteadiness on feet  Muscle weakness (generalized)     Problem List Patient Active Problem List   Diagnosis Date Noted  . Cognitive impairment 02/19/2020  . Breast cancer of upper-outer quadrant of left female breast ([place]) 05/14/2011  . Vitamin D deficiency 04/02/2011  . DIASTOLIC HEART FAILURE, CHRONIC 07/10/2009  . DIABETES MELLITUS, TYPE II 06/18/2009  . HYPERLIPIDEMIA 06/18/2009  . SCHIZOPHRENIA 06/18/2009  . HYPERTENSION 06/18/2009  . MI 06/18/2009  . RESPIRATORY FAILURE, ACUTE 06/18/2009  . DYSPNEA ON EXERTION 06/18/2009    Alda Lea, PT 05/09/2020, 7:31 PM  [facility] [address] [place], [place], [address] Phone: [phone]   Fax:  [phone]  Name: Alexandra Lamb MRN: [id number] Date of Birth: 1946/01/11

## 2020-05-13 ENCOUNTER — Other Ambulatory Visit: Payer: Self-pay

## 2020-05-13 ENCOUNTER — Encounter: Payer: Self-pay | Admitting: Physical Therapy

## 2020-05-13 ENCOUNTER — Ambulatory Visit: Payer: Medicare Other | Admitting: Physical Therapy

## 2020-05-13 DIAGNOSIS — M6281 Muscle weakness (generalized): Secondary | ICD-10-CM | POA: Diagnosis not present

## 2020-05-13 DIAGNOSIS — R2681 Unsteadiness on feet: Secondary | ICD-10-CM | POA: Diagnosis not present

## 2020-05-13 DIAGNOSIS — R2689 Other abnormalities of gait and mobility: Secondary | ICD-10-CM | POA: Diagnosis not present

## 2020-05-14 DIAGNOSIS — F200 Paranoid schizophrenia: Secondary | ICD-10-CM | POA: Diagnosis not present

## 2020-05-14 NOTE — Therapy (Signed)
[facility] [address] [place], [place], [address] Phone: [phone]   Fax:  [phone]  Physical Therapy Treatment  Patient Details  Name: Alexandra Lamb MRN: [id number] Date of Birth: Mar 21, 1946 Referring Provider (PT): Dr. Harlan Stains, MD   Encounter Date: 05/13/2020   PT End of Session - 05/14/20 1934    Visit Number 19    Number of Visits 23    Date for PT Re-Evaluation 05/24/20    Authorization Type Medicare and BCBS Supplement; 10th visit PN    Progress Note Due on Visit 20    PT Start Time 1110    PT Stop Time 1153    PT Time Calculation (min) 43 min    Equipment Utilized During Treatment Gait belt    Activity Tolerance Patient tolerated treatment well    Behavior During Therapy WFL for tasks assessed/performed           Past Medical History:  Diagnosis Date  . Cancer ([place])    left breast  . Diabetes mellitus   . Hyperlipidemia   . Hypertension   . MCI (mild cognitive impairment)   . Schizophrenia ([place])    paranoid    Past Surgical History:  Procedure Laterality Date  . BREAST SURGERY  2011   left breast mast  . MASTECTOMY     left  . TOOTH EXTRACTION      There were no vitals filed for this visit.        TherEx:   Pt performed standing bil. Hip flexion, extension and abduction with 3# weight on each leg - 10 reps each direction            [facility] Adult PT Treatment/Exercise - 05/14/20 0001      Transfers   Transfers Sit to Stand    Sit to Stand 5: Supervision    Number of Reps Other reps (comment)   5   Comments feet on floor - no UE support      Ambulation/Gait   Ambulation/Gait Yes    Ambulation/Gait Assistance 5: Supervision    Ambulation Distance (Feet) 250 Feet    Assistive device None    Gait Pattern Step-through pattern    Ambulation Surface Level;Indoor      Knee/Hip Exercises: Standing   Heel Raises Both;1 set;10 reps               Balance  Exercises - 05/14/20 0001      Balance Exercises: Standing   Rockerboard Anterior/posterior;10 reps;EO;UE support   with min to mod assist due to pt's fear of falling   Sidestepping 2 reps   inside // bars   Marching Foam/compliant surface;Static;10 reps   with mod hand held assist due to pt's fear of falling   Other Standing Exercises pt performed stepping over and back of balance beam 5 reps each foot with 1 UE support on // bar    Other Standing Exercises Comments Pt performed cone taps to 3 cones with min to mod HHA - pt had some difficulty lifting leg high enough to tap the top of each cone without tipping it over          Pt performed 4 square step activity - stepping forward over yardstick on floor, stepping laterally to Rt, stepping backward over Yardstick on floor, and then stepping laterally to Lt; pt performed this stepping activity 4 reps with min to CGA on 4th rep     PT Short Term  Goals - 05/14/20 1945      PT SHORT TERM GOAL #1   Title Patient will reduce Five time Sit to Stand time by 3 seconds without hands from standard surface height in order to decrease risk for falls.    Baseline 03/27/20: 13.59 sec's no UE support from standard height chair, decreased, just not to goal level.    Status Partially Met      PT SHORT TERM GOAL #2   Title Patient will improve BERG balance score by 5 points for CDC and improvement in static and dynamic balance.    Baseline 03/27/20: 43/56 scored today (increased by 10 points)    Status Achieved      PT SHORT TERM GOAL #3   Title Patient will trial use of a straight cane vs RW for >300' using supervision assist.    Baseline 03/27/20: 440 feet today with no AD with supervision on indoors surfaces    Status Achieved    Target Date 03/30/20      PT SHORT TERM GOAL #4   Title Pt will demonstrate ability to perform initial HEP with supervision and staff will indicate that pt has performed atleast 3 days/week    Status Achieved    Target  Date 03/30/20      PT SHORT TERM GOAL #5   Title Pt will participate in assessment of falls risk during gait with DGI    Baseline performed on 12/13: 14/24    Time 4    Period Weeks    Status Achieved    Target Date 03/30/20             PT Long Term Goals - 05/14/20 1946      PT LONG TERM GOAL #1   Title Pt will increase Berg score to >/= 46/56 for decreased fall risk.  (LTG due by 05/25/2019)    Baseline 33/56 02/29/20;  43/56 on 03-27-20    Time 3    Period Weeks    Status Revised      PT LONG TERM GOAL #2   Title Patient will increase DGI score by 4 points for significant change in dynamic balance    Baseline 14/24    Time 3    Period Weeks    Status Revised      PT LONG TERM GOAL #3   Title Pt will ambulate 500+ feet with LRAD at mod-I for increased independence with functional mobility,    Baseline 100' walking stick with CGA    Time 3    Period Weeks    Status Revised      PT LONG TERM GOAL #4   Title Pt will negotiate 12 stairs with one rails and LRAD safely with supervision    Baseline No steps at pt's new living environment - [facility]    Time 8    Period Weeks    Status Deferred      PT LONG TERM GOAL #5   Title Pt will be able to perform final HEP with staff's supervision and will have re-started community exercise/wellness with group home    Baseline Pt has moved from group home to Assisted Living facility - goal deferred    Time 8    Period Weeks    Status Deferred      PT LONG TERM GOAL #6   Title Pt will step over 3" height object to simulate pt's entrance to shower with SBA (without HHA).    Time 3  Period Weeks    Status New                 Plan - 05/14/20 1939    Clinical Impression Statement Pt is progressing well towards goals - continues to need cues for gait pattern for reduced deviations.  Pt is becoming more confident with mobility and gait.  Cont with POC.    Personal Factors and Comorbidities Comorbidity  3+;Age;Behavior Pattern;Social Background;Time since onset of injury/illness/exacerbation;Fitness    Comorbidities paranoid schizophrenia, diabetes type II, recent HgbA1c approx 8, COPD, HTN, HLD, obesity, mild cognitive impairment, Breast CA (L) and masectomy, vit D deficiency    Examination-Activity Limitations Stairs;Stand;Transfers;Locomotion Level;Bend;Dressing    Examination-Participation Restrictions Church;Community Activity    Stability/Clinical Decision Making Evolving/Moderate complexity    Rehab Potential Good    PT Frequency 2x / week    PT Duration 3 weeks    PT Treatment/Interventions ADLs/Self Care Home Management;Stair training;Gait training;Functional mobility training;Therapeutic activities;Balance training;DME Instruction;Therapeutic exercise;Patient/family education;Neuromuscular re-education;Orthotic Fit/Training;Aquatic Therapy;Cognitive remediation    PT Next Visit Plan Continue balance & gait training    PT Home Exercise Plan Safe strengthening and balance    Consulted and Agree with Plan of Care Patient;Other (Comment)   Sister          Patient will benefit from skilled therapeutic intervention in order to improve the following deficits and impairments:  Abnormal gait,Decreased coordination,Decreased activity tolerance,Decreased cognition,Decreased mobility,Decreased strength,Decreased knowledge of use of DME,Decreased balance,Decreased safety awareness,Difficulty walking,Decreased range of motion,Decreased endurance  Visit Diagnosis: Other abnormalities of gait and mobility  Unsteadiness on feet  Muscle weakness (generalized)     Problem List Patient Active Problem List   Diagnosis Date Noted  . Cognitive impairment 02/19/2020  . Breast cancer of upper-outer quadrant of left female breast ([place]) 05/14/2011  . Vitamin D deficiency 04/02/2011  . DIASTOLIC HEART FAILURE, CHRONIC 07/10/2009  . DIABETES MELLITUS, TYPE II 06/18/2009  . HYPERLIPIDEMIA  06/18/2009  . SCHIZOPHRENIA 06/18/2009  . HYPERTENSION 06/18/2009  . MI 06/18/2009  . RESPIRATORY FAILURE, ACUTE 06/18/2009  . DYSPNEA ON EXERTION 06/18/2009    Alda Lea, PT 05/14/2020, 7:49 PM  [facility] [address] [place], [place], [address] Phone: [phone]   Fax:  [phone]  Name: ARTIE TAKAYAMA MRN: [id number] Date of Birth: 1945-11-03

## 2020-05-16 ENCOUNTER — Other Ambulatory Visit: Payer: Self-pay

## 2020-05-16 ENCOUNTER — Ambulatory Visit: Payer: Medicare Other | Admitting: Physical Therapy

## 2020-05-16 DIAGNOSIS — R2689 Other abnormalities of gait and mobility: Secondary | ICD-10-CM

## 2020-05-16 DIAGNOSIS — M6281 Muscle weakness (generalized): Secondary | ICD-10-CM

## 2020-05-16 DIAGNOSIS — R2681 Unsteadiness on feet: Secondary | ICD-10-CM

## 2020-05-17 ENCOUNTER — Encounter: Payer: Self-pay | Admitting: Physical Therapy

## 2020-05-17 NOTE — Therapy (Addendum)
[facility] [address] [place], [place], [address] Phone: [phone]   Fax:  [phone]  Physical Therapy Treatment & 20th visit progress note   Progress Note Reporting Period 04-10-20 to 05-16-20  See note below for Objective Data and Assessment of Progress/Goals.    Patient Details  Name: Alexandra Lamb MRN: [id number] Date of Birth: 27-Sep-1945 Referring Provider (PT): Dr. Harlan Stains, MD   Encounter Date: 05/16/2020   PT End of Session - 05/17/20 1254    Visit Number 20    Number of Visits 23    Date for PT Re-Evaluation 05/24/20    Authorization Type Medicare and BCBS Supplement; 10th visit PN    Progress Note Due on Visit 20    PT Start Time 1105    PT Stop Time 1147    PT Time Calculation (min) 42 min    Equipment Utilized During Treatment Gait belt    Activity Tolerance Patient tolerated treatment well    Behavior During Therapy WFL for tasks assessed/performed           Past Medical History:  Diagnosis Date  . Cancer ([place])    left breast  . Diabetes mellitus   . Hyperlipidemia   . Hypertension   . MCI (mild cognitive impairment)   . Schizophrenia ([place])    paranoid    Past Surgical History:  Procedure Laterality Date  . BREAST SURGERY  2011   left breast mast  . MASTECTOMY     left  . TOOTH EXTRACTION      There were no vitals filed for this visit.   Subjective Assessment - 05/17/20 1246    Subjective Pt reports no changes or problems - continues to do well in her new living facility    Pertinent History PMH: paranoid schizophrenia, diabetes type II, recent HgbA1c approx 8, COPD, HTN, HLD, obesity, mild cognitive impairment, Breast CA (L) and masectomy, vit D deficiency    Limitations Standing;Walking;Lifting;House hold activities    Patient Stated Goals improve balance    Currently in Pain? No/denies                             [facility] Adult PT  Treatment/Exercise - 05/17/20 0001      Transfers   Transfers Sit to Stand    Sit to Stand 5: Supervision    Comments feet on floor - no UE support      Ambulation/Gait   Ambulation/Gait Yes    Ambulation/Gait Assistance 5: Supervision    Ambulation Distance (Feet) 230 Feet    Assistive device None    Gait Pattern Step-through pattern    Ambulation Surface Level;Indoor    Ramp 5: Supervision    Ramp Details (indicate cue type and reason) no device - pt negotiated ramp 2 reps      Therapeutic Activites    Other Therapeutic Activities Pt performed floor to stand transfer 2 reps - transferred from standing to tall kneeling on mat on floor - to side sitting with verbal and tactile cues for positioning; returned to quadruped to tall kneeling with CGA and cues; pt perfomed transfer from 1/2 kneeling to standing with bil. UE support on mat with SBA               Balance Exercises - 05/17/20 0001      Balance Exercises: Standing   Rockerboard Anterior/posterior;10 reps;EO;UE support   with min to  mod assist due to pt's fear of falling   Sidestepping 2 reps   inside // bars   Marching Foam/compliant surface;Static;10 reps   with mod hand held assist due to pt's fear of falling   Other Standing Exercises Comments Pt performed cone taps to 3 cones with min to mod HHA - pt had some difficulty lifting leg high enough to tap the top of each cone without tipping it over          Pt performed negotiation of obstacle course - ladder to fascilitate increased step length, figure 8's around cones and  Stepping stones of various heights to improve balance with SLS - min to mod hand held assist      PT Short Term Goals - 05/17/20 1257      PT SHORT TERM GOAL #1   Title Patient will reduce Five time Sit to Stand time by 3 seconds without hands from standard surface height in order to decrease risk for falls.    Baseline 03/27/20: 13.59 sec's no UE support from standard height chair, decreased,  just not to goal level.    Status Partially Met      PT SHORT TERM GOAL #2   Title Patient will improve BERG balance score by 5 points for CDC and improvement in static and dynamic balance.    Baseline 03/27/20: 43/56 scored today (increased by 10 points)    Status Achieved      PT SHORT TERM GOAL #3   Title Patient will trial use of a straight cane vs RW for >300' using supervision assist.    Baseline 03/27/20: 440 feet today with no AD with supervision on indoors surfaces    Status Achieved    Target Date 03/30/20      PT SHORT TERM GOAL #4   Title Pt will demonstrate ability to perform initial HEP with supervision and staff will indicate that pt has performed atleast 3 days/week    Status Achieved    Target Date 03/30/20      PT SHORT TERM GOAL #5   Title Pt will participate in assessment of falls risk during gait with DGI    Baseline performed on 12/13: 14/24    Time 4    Period Weeks    Status Achieved    Target Date 03/30/20             PT Long Term Goals - 05/17/20 1303      PT LONG TERM GOAL #1   Title Pt will increase Berg score to >/= 46/56 for decreased fall risk.  (LTG due by 05/25/2019)    Baseline 33/56 02/29/20;  43/56 on 03-27-20    Time 3    Period Weeks    Status Revised      PT LONG TERM GOAL #2   Title Patient will increase DGI score by 4 points for significant change in dynamic balance    Baseline 14/24    Time 3    Period Weeks    Status Revised      PT LONG TERM GOAL #3   Title Pt will ambulate 500+ feet with LRAD at mod-I for increased independence with functional mobility,    Baseline 100' walking stick with CGA    Time 3    Period Weeks    Status Revised      PT LONG TERM GOAL #4   Title Pt will negotiate 12 stairs with one rails and LRAD safely with supervision  Baseline No steps at pt's new living environment - [facility]    Time 8    Period Weeks    Status Deferred      PT LONG TERM GOAL #5   Title Pt will be able to  perform final HEP with staff's supervision and will have re-started community exercise/wellness with group home    Baseline Pt has moved from group home to Assisted Living facility - goal deferred    Time 8    Period Weeks    Status Deferred      PT LONG TERM GOAL #6   Title Pt will step over 3" height object to simulate pt's entrance to shower with SBA (without HHA).    Time 3    Period Weeks    Status New                 Plan - 05/17/20 1255    Clinical Impression Statement This 10th visit progress note covers dates 04-10-20 - 05-16-20;  pt has met 5/5 STG's and is progressing well towards LTG's.  Pt demonstrates improved gait pattern with reduced gait deviations (pt continues to carry walking stick but does not consistently use).  D/C is planned for next week, after 2 additional visits. Cont with POC.    Personal Factors and Comorbidities Comorbidity 3+;Age;Behavior Pattern;Social Background;Time since onset of injury/illness/exacerbation;Fitness    Comorbidities paranoid schizophrenia, diabetes type II, recent HgbA1c approx 8, COPD, HTN, HLD, obesity, mild cognitive impairment, Breast CA (L) and masectomy, vit D deficiency    Examination-Activity Limitations Stairs;Stand;Transfers;Locomotion Level;Bend;Dressing    Examination-Participation Restrictions Church;Community Activity    Stability/Clinical Decision Making Evolving/Moderate complexity    Rehab Potential Good    PT Frequency 2x / week    PT Duration 3 weeks    PT Treatment/Interventions ADLs/Self Care Home Management;Stair training;Gait training;Functional mobility training;Therapeutic activities;Balance training;DME Instruction;Therapeutic exercise;Patient/family education;Neuromuscular re-education;Orthotic Fit/Training;Aquatic Therapy;Cognitive remediation    PT Next Visit Plan Continue balance & gait training    PT Home Exercise Plan Safe strengthening and balance    Consulted and Agree with Plan of Care Patient;Other  (Comment)   Sister          Patient will benefit from skilled therapeutic intervention in order to improve the following deficits and impairments:  Abnormal gait,Decreased coordination,Decreased activity tolerance,Decreased cognition,Decreased mobility,Decreased strength,Decreased knowledge of use of DME,Decreased balance,Decreased safety awareness,Difficulty walking,Decreased range of motion,Decreased endurance  Visit Diagnosis: Other abnormalities of gait and mobility  Unsteadiness on feet  Muscle weakness (generalized)     Problem List Patient Active Problem List   Diagnosis Date Noted  . Cognitive impairment 02/19/2020  . Breast cancer of upper-outer quadrant of left female breast ([place]) 05/14/2011  . Vitamin D deficiency 04/02/2011  . DIASTOLIC HEART FAILURE, CHRONIC 07/10/2009  . DIABETES MELLITUS, TYPE II 06/18/2009  . HYPERLIPIDEMIA 06/18/2009  . SCHIZOPHRENIA 06/18/2009  . HYPERTENSION 06/18/2009  . MI 06/18/2009  . RESPIRATORY FAILURE, ACUTE 06/18/2009  . DYSPNEA ON EXERTION 06/18/2009    Alda Lea, PT 05/17/2020, 1:04 PM  [facility] [address] [place], [place], [address] Phone: [phone]   Fax:  [phone]  Name: Alexandra Lamb MRN: [id number] Date of Birth: [birth year]

## 2020-05-20 ENCOUNTER — Ambulatory Visit: Payer: Medicare Other | Admitting: Physical Therapy

## 2020-05-20 ENCOUNTER — Other Ambulatory Visit: Payer: Self-pay

## 2020-05-20 DIAGNOSIS — M6281 Muscle weakness (generalized): Secondary | ICD-10-CM | POA: Diagnosis not present

## 2020-05-20 DIAGNOSIS — R2681 Unsteadiness on feet: Secondary | ICD-10-CM

## 2020-05-20 DIAGNOSIS — R2689 Other abnormalities of gait and mobility: Secondary | ICD-10-CM

## 2020-05-20 NOTE — Therapy (Addendum)
[facility] [address] [place], [place], [address] Phone: [phone]   Fax:  [phone]  Physical Therapy Treatment  Patient Details  Name: Alexandra Lamb MRN: [id number] Date of Birth: January 16, 1946 Referring Provider (PT): Dr. Harlan Stains, MD   Encounter Date: 05/20/2020   PT End of Session - 05/20/20 2131    Visit Number 21    Number of Visits 23    Date for PT Re-Evaluation 05/24/20    Authorization Type Medicare and BCBS Supplement; 10th visit PN    Progress Note Due on Visit 20    PT Start Time 1105    PT Stop Time 1152    PT Time Calculation (min) 47 min    Equipment Utilized During Treatment Gait belt    Activity Tolerance Patient tolerated treatment well    Behavior During Therapy WFL for tasks assessed/performed           Past Medical History:  Diagnosis Date  . Cancer ([place])    left breast  . Diabetes mellitus   . Hyperlipidemia   . Hypertension   . MCI (mild cognitive impairment)   . Schizophrenia ([place])    paranoid    Past Surgical History:  Procedure Laterality Date  . BREAST SURGERY  2011   left breast mast  . MASTECTOMY     left  . TOOTH EXTRACTION      There were no vitals filed for this visit.                      [facility] Adult PT Treatment/Exercise - 05/21/20 0001      Transfers   Transfers Sit to Stand    Sit to Stand 5: Supervision    Comments feet on Aire[employer] - 5 reps without UE support with CGA due to pt's fear of falling      Ambulation/Gait   Ambulation/Gait Yes    Ambulation/Gait Assistance 5: Supervision    Ambulation/Gait Assistance Details tactile cue on Lt upper rib cage to elevatte Lt shoulder; verbal cues for incr. arm swing    Ambulation Distance (Feet) 350 Feet    Assistive device None    Gait Pattern Step-through pattern    Ambulation Surface Level;Indoor    Ramp 5: Supervision    Ramp Details (indicate cue type and reason) no device - HHA  due to fear      Therapeutic Activites    Therapeutic Activities Other Therapeutic Activities    Other Therapeutic Activities floor to stand transfer with UE support with CGA and cues for sequence      Knee/Hip Exercises: Aerobic   Recumbent Bike SciFit level 3.0 x 8" with UE's & LE's               Balance Exercises - 05/21/20 0001      Balance Exercises: Standing   Rockerboard Anterior/posterior;10 reps;EO;UE support   with min to mod assist due to pt's fear of falling   Sidestepping 2 reps    Marching Foam/compliant surface;Static;10 reps   with mod hand held assist due to pt's fear of falling   Other Standing Exercises Comments Pt performed cone taps to 3 cones with min to mod HHA               PT Short Term Goals - 05/20/20 2132      PT SHORT TERM GOAL #1   Title Patient will reduce Five time Sit to Stand time by  3 seconds without hands from standard surface height in order to decrease risk for falls.    Baseline 03/27/20: 13.59 sec's no UE support from standard height chair, decreased, just not to goal level.    Status Partially Met      PT SHORT TERM GOAL #2   Title Patient will improve BERG balance score by 5 points for CDC and improvement in static and dynamic balance.    Baseline 03/27/20: 43/56 scored today (increased by 10 points)    Status Achieved      PT SHORT TERM GOAL #3   Title Patient will trial use of a straight cane vs RW for >300' using supervision assist.    Baseline 03/27/20: 440 feet today with no AD with supervision on indoors surfaces    Status Achieved    Target Date 03/30/20      PT SHORT TERM GOAL #4   Title Pt will demonstrate ability to perform initial HEP with supervision and staff will indicate that pt has performed atleast 3 days/week    Status Achieved    Target Date 03/30/20      PT SHORT TERM GOAL #5   Title Pt will participate in assessment of falls risk during gait with DGI    Baseline performed on 12/13: 14/24    Time 4     Period Weeks    Status Achieved    Target Date 03/30/20             PT Long Term Goals - 05/20/20 2132      PT LONG TERM GOAL #1   Title Pt will increase Berg score to >/= 46/56 for decreased fall risk.  (LTG due by 05/25/2019)    Baseline 33/56 02/29/20;  43/56 on 03-27-20    Time 3    Period Weeks    Status Revised      PT LONG TERM GOAL #2   Title Patient will increase DGI score by 4 points for significant change in dynamic balance    Baseline 14/24    Time 3    Period Weeks    Status Revised      PT LONG TERM GOAL #3   Title Pt will ambulate 500+ feet with LRAD at mod-I for increased independence with functional mobility,    Baseline 100' walking stick with CGA    Time 3    Period Weeks    Status Revised      PT LONG TERM GOAL #4   Title Pt will negotiate 12 stairs with one rails and LRAD safely with supervision    Baseline No steps at pt's new living environment - [facility]    Time 8    Period Weeks    Status Deferred      PT LONG TERM GOAL #5   Title Pt will be able to perform final HEP with staff's supervision and will have re-started community exercise/wellness with group home    Baseline Pt has moved from group home to Assisted Living facility - goal deferred    Time 8    Period Weeks    Status Deferred      PT LONG TERM GOAL #6   Title Pt will step over 3" height object to simulate pt's entrance to shower with SBA (without HHA).    Time 3    Period Weeks    Status New                 Plan -  05/20/20 2132    Clinical Impression Statement Pt is progressing well towards goals; Lt shoulder was more elevated during gait training without use of device with only min tactile cue on Lt lateral rib cage. Plan D/C next session    Personal Factors and Comorbidities Comorbidity 3+;Age;Behavior Pattern;Social Background;Time since onset of injury/illness/exacerbation;Fitness    Comorbidities paranoid schizophrenia, diabetes type II, recent HgbA1c approx  8, COPD, HTN, HLD, obesity, mild cognitive impairment, Breast CA (L) and masectomy, vit D deficiency    Examination-Activity Limitations Stairs;Stand;Transfers;Locomotion Level;Bend;Dressing    Examination-Participation Restrictions Church;Community Activity    Stability/Clinical Decision Making Evolving/Moderate complexity    Rehab Potential Good    PT Frequency 2x / week    PT Duration 3 weeks    PT Treatment/Interventions ADLs/Self Care Home Management;Stair training;Gait training;Functional mobility training;Therapeutic activities;Balance training;DME Instruction;Therapeutic exercise;Patient/family education;Neuromuscular re-education;Orthotic Fit/Training;Aquatic Therapy;Cognitive remediation    PT Next Visit Plan Continue balance & gait training    PT Home Exercise Plan Safe strengthening and balance    Consulted and Agree with Plan of Care Patient;Other (Comment)   Sister          Patient will benefit from skilled therapeutic intervention in order to improve the following deficits and impairments:  Abnormal gait,Decreased coordination,Decreased activity tolerance,Decreased cognition,Decreased mobility,Decreased strength,Decreased knowledge of use of DME,Decreased balance,Decreased safety awareness,Difficulty walking,Decreased range of motion,Decreased endurance  Visit Diagnosis: Other abnormalities of gait and mobility  Unsteadiness on feet  Muscle weakness (generalized)     Problem List Patient Active Problem List   Diagnosis Date Noted  . Cognitive impairment 02/19/2020  . Breast cancer of upper-outer quadrant of left female breast ([place]) 05/14/2011  . Vitamin D deficiency 04/02/2011  . DIASTOLIC HEART FAILURE, CHRONIC 07/10/2009  . DIABETES MELLITUS, TYPE II 06/18/2009  . HYPERLIPIDEMIA 06/18/2009  . SCHIZOPHRENIA 06/18/2009  . HYPERTENSION 06/18/2009  . MI 06/18/2009  . RESPIRATORY FAILURE, ACUTE 06/18/2009  . DYSPNEA ON EXERTION 06/18/2009    Alda Lea, PT 05/21/2020, 7:21 PM  [facility] [address] [place], [place], [address] Phone: [phone]   Fax:  [phone]  Name: Alexandra Lamb MRN: [id number] Date of Birth: 03-05-1946

## 2020-05-21 ENCOUNTER — Encounter: Payer: Self-pay | Admitting: Physical Therapy

## 2020-05-23 ENCOUNTER — Other Ambulatory Visit: Payer: Self-pay

## 2020-05-23 ENCOUNTER — Ambulatory Visit: Payer: Medicare Other | Attending: Family Medicine | Admitting: Physical Therapy

## 2020-05-23 DIAGNOSIS — M6281 Muscle weakness (generalized): Secondary | ICD-10-CM | POA: Diagnosis not present

## 2020-05-23 DIAGNOSIS — R2689 Other abnormalities of gait and mobility: Secondary | ICD-10-CM | POA: Diagnosis not present

## 2020-05-23 DIAGNOSIS — R2681 Unsteadiness on feet: Secondary | ICD-10-CM | POA: Diagnosis not present

## 2020-05-23 NOTE — Patient Instructions (Signed)
EXERCISE PROGRAM:   TRY TO DO AN EXERCISE CLASS 5 DAYS/WEEK (MON - Friday) - AT LEAST 1 CLASS  WALK THE HALLWAYS - 2 TIMES DOWN & BACK -- TWICE A DAY  PLEASE DO THE BALANCE EXERCISES I GAVE YOU AT LEAST 3 DAYS/WEEK;  SUCH AS [id number]

## 2020-05-24 ENCOUNTER — Encounter: Payer: Self-pay | Admitting: Physical Therapy

## 2020-05-24 NOTE — Therapy (Signed)
[facility] [address] [place], [place], [address] Phone: [phone]   Fax:  [phone]  Physical Therapy Treatment  Patient Details  Name: Alexandra Lamb MRN: [id number] Date of Birth: Jun 18, 1945 Referring Provider (PT): Dr. Harlan Stains, MD   Encounter Date: 05/23/2020   PT End of Session - 05/24/20 1330    Visit Number 22    Number of Visits 23    Date for PT Re-Evaluation 05/24/20    Authorization Type Medicare and BCBS Supplement; 10th visit PN    Progress Note Due on Visit 20    PT Start Time 1105    PT Stop Time 1151    PT Time Calculation (min) 46 min    Equipment Utilized During Treatment Gait belt    Activity Tolerance Patient tolerated treatment well    Behavior During Therapy WFL for tasks assessed/performed           Past Medical History:  Diagnosis Date  . Cancer ([place])    left breast  . Diabetes mellitus   . Hyperlipidemia   . Hypertension   . MCI (mild cognitive impairment)   . Schizophrenia ([place])    paranoid    Past Surgical History:  Procedure Laterality Date  . BREAST SURGERY  2011   left breast mast  . MASTECTOMY     left  . TOOTH EXTRACTION      There were no vitals filed for this visit.   Subjective Assessment - 05/24/20 1316    Subjective Pt reports no changes or problems - is ready for discharge today; states she would like to ride the bike today ([place])    Patient is accompained by: Family member   sister Arbie Cookey   Pertinent History PMH: paranoid schizophrenia, diabetes type II, recent HgbA1c approx 8, COPD, HTN, HLD, obesity, mild cognitive impairment, Breast CA (L) and masectomy, vit D deficiency    Limitations Standing;Walking;Lifting;House hold activities    Patient Stated Goals improve balance    Currently in Pain? No/denies              [facility] PT Assessment - 05/24/20 0001      Berg Balance Test   Sit to Stand Able to stand without using hands and stabilize  independently    Standing Unsupported Able to stand safely 2 minutes    Sitting with Back Unsupported but Feet Supported on Floor or Stool Able to sit safely and securely 2 minutes    Stand to Sit Sits safely with minimal use of hands    Transfers Able to transfer safely, minor use of hands    Standing Unsupported with Eyes Closed Able to stand 10 seconds safely    Standing Unsupported with Feet Together Able to place feet together independently and stand for 1 minute with supervision    From Standing, Reach Forward with Outstretched Arm Can reach forward >12 cm safely (5")   8 inches   From Standing Position, Pick up Object from Floor Able to pick up shoe, needs supervision    From Standing Position, Turn to Look Behind Over each Shoulder Looks behind one side only/other side shows less weight shift   right>left   Turn 360 Degrees Able to turn 360 degrees safely but slowly   ~6 seconds both ways   Standing Unsupported, Alternately Place Feet on Step/Stool Able to complete 4 steps without aid or supervision   min guard to min assist after 4 reps. 11.41 sec's.  Standing Unsupported, One Foot in Front Able to take small step independently and hold 30 seconds    Standing on One Leg Tries to lift leg/unable to hold 3 seconds but remains standing independently    Total Score 43                         [facility] Adult PT Treatment/Exercise - 05/24/20 0001      Transfers   Transfers Sit to Stand    Sit to Stand 5: Supervision    Comments feet on Airex - 5 reps      Ambulation/Gait   Ambulation/Gait Yes    Ambulation/Gait Assistance 5: Supervision    Ambulation/Gait Assistance Details tactile cue of Lt upper rib cage to fascilitate Lt shoulder elevation    Ambulation Distance (Feet) 230 Feet    Assistive device None    Gait Pattern Step-through pattern    Ambulation Surface Level;Indoor    Gait velocity 12.87 secs = 2.55 ft/sec   14.84 secs = 2.21 ft/sec     Standardized  Balance Assessment   Standardized Balance Assessment Timed Up and Go Test      Timed Up and Go Test   TUG Normal TUG    Normal TUG (seconds) 17.78   15.13 secs on 2nd trial - no device used     Therapeutic Activites    Therapeutic Activities Other Therapeutic Activities    Other Therapeutic Activities floor to stand transfer with UE support with SBA - few verbal cues given for positioning and sequence for transfer;  pt practiced pickign up various sized objects off floor - bottle of hand gel for tallest object, stop watch and an ink pen; pt able to bend down and pick up each object individually (standing on red mat for compliant surface training) turn 180 degrees to place each object on mat table, and then placed each object from mat table back down on floor.  Pt needs frequent verbal cues to keep legs wide for increased BOS for increased stability and safety.      Knee/Hip Exercises: Aerobic   Nustep Level 4.0 with UE's & LE's for 6"                  PT Education - 05/24/20 1328    Education Details instructed pt in exercise program - including exercise class participation at [facility], walking program (in hallway at [facility]) and issued balance HEP    Person(s) Educated Patient;Caregiver(s)    Methods Explanation;Handout    Comprehension Verbalized understanding            PT Short Term Goals - 05/24/20 1331      PT SHORT TERM GOAL #1   Title Patient will reduce Five time Sit to Stand time by 3 seconds without hands from standard surface height in order to decrease risk for falls.    Baseline 03/27/20: 13.59 sec's no UE support from standard height chair, decreased, just not to goal level.    Status Partially Met      PT SHORT TERM GOAL #2   Title Patient will improve BERG balance score by 5 points for CDC and improvement in static and dynamic balance.    Baseline 03/27/20: 43/56 scored today (increased by 10 points)    Status Achieved      PT SHORT TERM  GOAL #3   Title Patient will trial use of a straight cane vs RW for >300' using  supervision assist.    Baseline 03/27/20: 440 feet today with no AD with supervision on indoors surfaces    Status Achieved    Target Date 03/30/20      PT SHORT TERM GOAL #4   Title Pt will demonstrate ability to perform initial HEP with supervision and staff will indicate that pt has performed atleast 3 days/week    Status Achieved    Target Date 03/30/20      PT SHORT TERM GOAL #5   Title Pt will participate in assessment of falls risk during gait with DGI    Baseline performed on 12/13: 14/24    Time 4    Period Weeks    Status Achieved    Target Date 03/30/20             PT Long Term Goals - 05/23/20 1130      PT LONG TERM GOAL #1   Title Pt will increase Berg score to >/= 46/56 for decreased fall risk.  (LTG due by 05/25/2019)    Baseline 33/56 02/29/20;  43/56 on 03-27-20    Time 3    Period Weeks    Status Revised      PT LONG TERM GOAL #2   Title Patient will increase DGI score by 4 points for significant change in dynamic balance    Baseline 14/24    Time 3    Period Weeks    Status Deferred      PT LONG TERM GOAL #3   Title Pt will ambulate 500+ feet with LRAD at mod-I for increased independence with functional mobility,    Baseline 100' walking stick with CGA    Time 3    Period Weeks    Status Achieved      PT LONG TERM GOAL #4   Title Pt will negotiate 12 stairs with one rails and LRAD safely with supervision    Baseline No steps at pt's new living environment - [facility]    Time 8    Period Weeks    Status Deferred      PT LONG TERM GOAL #5   Title Pt will be able to perform final HEP with staff's supervision and will have re-started community exercise/wellness with group home    Baseline Pt has moved from group home to Assisted Living facility - goal deferred    Time 8    Period Weeks    Status Deferred      PT LONG TERM GOAL #6   Title Pt will step over 3"  height object to simulate pt's entrance to shower with SBA (without HHA).    Baseline met 05-23-20 - pt stepped over balance beam    Time 3    Period Weeks    Status Achieved                 Plan - 05/24/20 1335    Clinical Impression Statement Pt has met majority of LTG's with 2 LTG's being deferred due to pt's change in living facility and due to visual deficits which impact step negotiation (produces anxiety and fear) and also due to there being no steps to negotiate in pt's current living facility.  Pt's Berg score remains 43/56;  pt has improved with floor to stand transfers and with stepping over objects.  Pt is much more confident with ambulation, and often carries her walking stick rather than using it for assistance with gait.  Pt does remain at slight fall  risk per Berg score of 43/56 and TUG score of 15.13 secs.  Pt is discharged at this time due to completion of program and majority of LTG's achieved with pt maximizing current functional level at this time.    Personal Factors and Comorbidities Comorbidity 3+;Age;Behavior Pattern;Social Background;Time since onset of injury/illness/exacerbation;Fitness    Comorbidities paranoid schizophrenia, diabetes type II, recent HgbA1c approx 8, COPD, HTN, HLD, obesity, mild cognitive impairment, Breast CA (L) and masectomy, vit D deficiency    Examination-Activity Limitations Stairs;Stand;Transfers;Locomotion Level;Bend;Dressing    Examination-Participation Restrictions Church;Community Activity    Stability/Clinical Decision Making Evolving/Moderate complexity    Rehab Potential Good    PT Frequency 2x / week    PT Duration 3 weeks    PT Treatment/Interventions ADLs/Self Care Home Management;Stair training;Gait training;Functional mobility training;Therapeutic activities;Balance training;DME Instruction;Therapeutic exercise;Patient/family education;Neuromuscular re-education;Orthotic Fit/Training;Aquatic Therapy;Cognitive remediation    PT  Next Visit Plan D/C on 05-23-20    PT Home Exercise Plan Safe strengthening and balance    Consulted and Agree with Plan of Care Patient;Other (Comment)   Sister          Patient will benefit from skilled therapeutic intervention in order to improve the following deficits and impairments:  Abnormal gait,Decreased coordination,Decreased activity tolerance,Decreased cognition,Decreased mobility,Decreased strength,Decreased knowledge of use of DME,Decreased balance,Decreased safety awareness,Difficulty walking,Decreased range of motion,Decreased endurance  Visit Diagnosis: Other abnormalities of gait and mobility  Unsteadiness on feet  Muscle weakness (generalized)     Problem List Patient Active Problem List   Diagnosis Date Noted  . Cognitive impairment 02/19/2020  . Breast cancer of upper-outer quadrant of left female breast ([place]) 05/14/2011  . Vitamin D deficiency 04/02/2011  . DIASTOLIC HEART FAILURE, CHRONIC 07/10/2009  . DIABETES MELLITUS, TYPE II 06/18/2009  . HYPERLIPIDEMIA 06/18/2009  . SCHIZOPHRENIA 06/18/2009  . HYPERTENSION 06/18/2009  . MI 06/18/2009  . RESPIRATORY FAILURE, ACUTE 06/18/2009  . DYSPNEA ON EXERTION 06/18/2009      PHYSICAL THERAPY DISCHARGE SUMMARY  Visits from Start of Care:  22  Current functional level related to goals / functional outcomes: See above for progress towards goals   Remaining deficits: Continued gait and balance deficits; pt carries walking stick but does not use consistently   Education / Equipment: Pt has been instructed in balance exercises for HEP:  Pt has also been instructed to consistently participate in exercise classes offered  at her living facility [facility]) and also in a walking program (in hallway at [facility]) Plan: Patient agrees to discharge.  Patient goals were partially met. Patient is being discharged due to meeting the stated rehab goals.  ?????        Alda Lea, PT 05/24/2020, 1:45 PM  [facility] [address] [place], [place], [address] Phone: [phone]   Fax:  [phone]  Name: TAELAR GRONEWOLD MRN: [id number] Date of Birth: 1946-02-26

## 2020-05-30 DIAGNOSIS — R296 Repeated falls: Secondary | ICD-10-CM | POA: Diagnosis not present

## 2020-05-30 DIAGNOSIS — N3946 Mixed incontinence: Secondary | ICD-10-CM | POA: Diagnosis not present

## 2020-05-30 DIAGNOSIS — R262 Difficulty in walking, not elsewhere classified: Secondary | ICD-10-CM | POA: Diagnosis not present

## 2020-05-30 DIAGNOSIS — M6281 Muscle weakness (generalized): Secondary | ICD-10-CM | POA: Diagnosis not present

## 2020-05-30 DIAGNOSIS — R2681 Unsteadiness on feet: Secondary | ICD-10-CM | POA: Diagnosis not present

## 2020-05-31 DIAGNOSIS — R296 Repeated falls: Secondary | ICD-10-CM | POA: Diagnosis not present

## 2020-05-31 DIAGNOSIS — N3946 Mixed incontinence: Secondary | ICD-10-CM | POA: Diagnosis not present

## 2020-05-31 DIAGNOSIS — M6281 Muscle weakness (generalized): Secondary | ICD-10-CM | POA: Diagnosis not present

## 2020-05-31 DIAGNOSIS — R262 Difficulty in walking, not elsewhere classified: Secondary | ICD-10-CM | POA: Diagnosis not present

## 2020-05-31 DIAGNOSIS — R2681 Unsteadiness on feet: Secondary | ICD-10-CM | POA: Diagnosis not present

## 2020-06-04 DIAGNOSIS — M6281 Muscle weakness (generalized): Secondary | ICD-10-CM | POA: Diagnosis not present

## 2020-06-04 DIAGNOSIS — R2681 Unsteadiness on feet: Secondary | ICD-10-CM | POA: Diagnosis not present

## 2020-06-04 DIAGNOSIS — R262 Difficulty in walking, not elsewhere classified: Secondary | ICD-10-CM | POA: Diagnosis not present

## 2020-06-04 DIAGNOSIS — N3946 Mixed incontinence: Secondary | ICD-10-CM | POA: Diagnosis not present

## 2020-06-04 DIAGNOSIS — R296 Repeated falls: Secondary | ICD-10-CM | POA: Diagnosis not present

## 2020-06-06 DIAGNOSIS — M6281 Muscle weakness (generalized): Secondary | ICD-10-CM | POA: Diagnosis not present

## 2020-06-06 DIAGNOSIS — R2681 Unsteadiness on feet: Secondary | ICD-10-CM | POA: Diagnosis not present

## 2020-06-06 DIAGNOSIS — N3946 Mixed incontinence: Secondary | ICD-10-CM | POA: Diagnosis not present

## 2020-06-06 DIAGNOSIS — R262 Difficulty in walking, not elsewhere classified: Secondary | ICD-10-CM | POA: Diagnosis not present

## 2020-06-06 DIAGNOSIS — R296 Repeated falls: Secondary | ICD-10-CM | POA: Diagnosis not present

## 2020-06-08 DIAGNOSIS — R296 Repeated falls: Secondary | ICD-10-CM | POA: Diagnosis not present

## 2020-06-08 DIAGNOSIS — M6281 Muscle weakness (generalized): Secondary | ICD-10-CM | POA: Diagnosis not present

## 2020-06-08 DIAGNOSIS — R2681 Unsteadiness on feet: Secondary | ICD-10-CM | POA: Diagnosis not present

## 2020-06-08 DIAGNOSIS — N3946 Mixed incontinence: Secondary | ICD-10-CM | POA: Diagnosis not present

## 2020-06-08 DIAGNOSIS — R262 Difficulty in walking, not elsewhere classified: Secondary | ICD-10-CM | POA: Diagnosis not present

## 2020-06-10 DIAGNOSIS — N3946 Mixed incontinence: Secondary | ICD-10-CM | POA: Diagnosis not present

## 2020-06-10 DIAGNOSIS — M6281 Muscle weakness (generalized): Secondary | ICD-10-CM | POA: Diagnosis not present

## 2020-06-10 DIAGNOSIS — R296 Repeated falls: Secondary | ICD-10-CM | POA: Diagnosis not present

## 2020-06-10 DIAGNOSIS — R2681 Unsteadiness on feet: Secondary | ICD-10-CM | POA: Diagnosis not present

## 2020-06-10 DIAGNOSIS — R262 Difficulty in walking, not elsewhere classified: Secondary | ICD-10-CM | POA: Diagnosis not present

## 2020-06-11 DIAGNOSIS — M6281 Muscle weakness (generalized): Secondary | ICD-10-CM | POA: Diagnosis not present

## 2020-06-11 DIAGNOSIS — R2681 Unsteadiness on feet: Secondary | ICD-10-CM | POA: Diagnosis not present

## 2020-06-11 DIAGNOSIS — R262 Difficulty in walking, not elsewhere classified: Secondary | ICD-10-CM | POA: Diagnosis not present

## 2020-06-11 DIAGNOSIS — G3184 Mild cognitive impairment, so stated: Secondary | ICD-10-CM | POA: Diagnosis not present

## 2020-06-11 DIAGNOSIS — N3946 Mixed incontinence: Secondary | ICD-10-CM | POA: Diagnosis not present

## 2020-06-11 DIAGNOSIS — R296 Repeated falls: Secondary | ICD-10-CM | POA: Diagnosis not present

## 2020-06-11 DIAGNOSIS — F200 Paranoid schizophrenia: Secondary | ICD-10-CM | POA: Diagnosis not present

## 2020-06-12 DIAGNOSIS — R2681 Unsteadiness on feet: Secondary | ICD-10-CM | POA: Diagnosis not present

## 2020-06-12 DIAGNOSIS — M6281 Muscle weakness (generalized): Secondary | ICD-10-CM | POA: Diagnosis not present

## 2020-06-12 DIAGNOSIS — R262 Difficulty in walking, not elsewhere classified: Secondary | ICD-10-CM | POA: Diagnosis not present

## 2020-06-12 DIAGNOSIS — N3946 Mixed incontinence: Secondary | ICD-10-CM | POA: Diagnosis not present

## 2020-06-12 DIAGNOSIS — R296 Repeated falls: Secondary | ICD-10-CM | POA: Diagnosis not present

## 2020-06-13 DIAGNOSIS — R2681 Unsteadiness on feet: Secondary | ICD-10-CM | POA: Diagnosis not present

## 2020-06-13 DIAGNOSIS — N3946 Mixed incontinence: Secondary | ICD-10-CM | POA: Diagnosis not present

## 2020-06-13 DIAGNOSIS — M6281 Muscle weakness (generalized): Secondary | ICD-10-CM | POA: Diagnosis not present

## 2020-06-13 DIAGNOSIS — R296 Repeated falls: Secondary | ICD-10-CM | POA: Diagnosis not present

## 2020-06-13 DIAGNOSIS — R262 Difficulty in walking, not elsewhere classified: Secondary | ICD-10-CM | POA: Diagnosis not present

## 2020-06-18 DIAGNOSIS — R262 Difficulty in walking, not elsewhere classified: Secondary | ICD-10-CM | POA: Diagnosis not present

## 2020-06-18 DIAGNOSIS — R2681 Unsteadiness on feet: Secondary | ICD-10-CM | POA: Diagnosis not present

## 2020-06-18 DIAGNOSIS — N3946 Mixed incontinence: Secondary | ICD-10-CM | POA: Diagnosis not present

## 2020-06-18 DIAGNOSIS — M6281 Muscle weakness (generalized): Secondary | ICD-10-CM | POA: Diagnosis not present

## 2020-06-18 DIAGNOSIS — R296 Repeated falls: Secondary | ICD-10-CM | POA: Diagnosis not present

## 2020-06-19 ENCOUNTER — Telehealth: Payer: Self-pay | Admitting: *Deleted

## 2020-06-19 DIAGNOSIS — R262 Difficulty in walking, not elsewhere classified: Secondary | ICD-10-CM | POA: Diagnosis not present

## 2020-06-19 DIAGNOSIS — R159 Full incontinence of feces: Secondary | ICD-10-CM | POA: Diagnosis not present

## 2020-06-19 DIAGNOSIS — R946 Abnormal results of thyroid function studies: Secondary | ICD-10-CM | POA: Diagnosis not present

## 2020-06-19 DIAGNOSIS — R296 Repeated falls: Secondary | ICD-10-CM | POA: Diagnosis not present

## 2020-06-19 DIAGNOSIS — R2681 Unsteadiness on feet: Secondary | ICD-10-CM | POA: Diagnosis not present

## 2020-06-19 DIAGNOSIS — N3946 Mixed incontinence: Secondary | ICD-10-CM | POA: Diagnosis not present

## 2020-06-19 DIAGNOSIS — E1169 Type 2 diabetes mellitus with other specified complication: Secondary | ICD-10-CM | POA: Diagnosis not present

## 2020-06-19 DIAGNOSIS — M6281 Muscle weakness (generalized): Secondary | ICD-10-CM | POA: Diagnosis not present

## 2020-06-19 DIAGNOSIS — F209 Schizophrenia, unspecified: Secondary | ICD-10-CM | POA: Diagnosis not present

## 2020-06-19 DIAGNOSIS — E785 Hyperlipidemia, unspecified: Secondary | ICD-10-CM | POA: Diagnosis not present

## 2020-06-19 DIAGNOSIS — I10 Essential (primary) hypertension: Secondary | ICD-10-CM | POA: Diagnosis not present

## 2020-06-19 NOTE — Telephone Encounter (Signed)
Received VM from pt caregiver Arbie Cookey.  Attempt x1 to return call, no answer.  LVM to return call to the office.

## 2020-06-21 DIAGNOSIS — R296 Repeated falls: Secondary | ICD-10-CM | POA: Diagnosis not present

## 2020-06-21 DIAGNOSIS — N3946 Mixed incontinence: Secondary | ICD-10-CM | POA: Diagnosis not present

## 2020-06-21 DIAGNOSIS — R2681 Unsteadiness on feet: Secondary | ICD-10-CM | POA: Diagnosis not present

## 2020-06-21 DIAGNOSIS — R262 Difficulty in walking, not elsewhere classified: Secondary | ICD-10-CM | POA: Diagnosis not present

## 2020-06-21 DIAGNOSIS — M6281 Muscle weakness (generalized): Secondary | ICD-10-CM | POA: Diagnosis not present

## 2020-06-23 NOTE — Assessment & Plan Note (Signed)
Left breast cancer T2, N0, M0 stage II A. ER/PR positive HER-2 negative status post left mastectomy and currently on antiestrogen therapy with tamoxifen since 01/12/2010(plan is to treat for 10 years)  Tamoxifen toxicities: 1. Initially a lot of musculoskeletal aches and pains subsided 2. Patient stays very active by participating in [facility] and at the senior center she participates in craft making  Breast cancer surveillance: 1. Mammogram10/09/2018: Benign, Breast density category C 2. Breast exam10/13/2020is normal 3. Bone density 8/9/2016showed a T score of -2.2  Osteopenia: takes bisphosphonates along with calcium and vitamin D  Return to clinic on an as needed basis since she finished 10 years of tamoxifen therapy

## 2020-06-24 ENCOUNTER — Other Ambulatory Visit: Payer: Self-pay

## 2020-06-24 ENCOUNTER — Inpatient Hospital Stay: Payer: Medicare Other | Attending: Hematology and Oncology | Admitting: Hematology and Oncology

## 2020-06-24 DIAGNOSIS — Z79810 Long term (current) use of selective estrogen receptor modulators (SERMs): Secondary | ICD-10-CM | POA: Insufficient documentation

## 2020-06-24 DIAGNOSIS — M8580 Other specified disorders of bone density and structure, unspecified site: Secondary | ICD-10-CM | POA: Diagnosis not present

## 2020-06-24 DIAGNOSIS — Z170 Estrogen receptor positive status [ER+]: Secondary | ICD-10-CM | POA: Diagnosis not present

## 2020-06-24 DIAGNOSIS — C50412 Malignant neoplasm of upper-outer quadrant of left female breast: Secondary | ICD-10-CM

## 2020-06-24 NOTE — Progress Notes (Signed)
Patient Care Team: Harlan Stains, MD as PCP - General (Family Medicine)  DIAGNOSIS:    ICD-10-CM   1. Malignant neoplasm of upper-outer quadrant of left breast in female, estrogen receptor positive ([place])  C50.412    Z17.0     SUMMARY OF ONCOLOGIC HISTORY: Oncology History  Breast cancer of upper-outer quadrant of left female breast ([place])  10/22/2009 Mammogram    2 masses in the anterior aspect of the left breast.  1.9 x 1.4 cm. and 1.6 x 1.2 cm. 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 Post-Op not felt to be a candidate for chemo or XRT    01/12/2010 -  Anti-estrogen oral therapy   Tamoxifen 20 mg daily     CHIEF COMPLIANT: Follow-up of left breast cancer on tamoxifen therapy  INTERVAL HISTORY: Alexandra Lamb is a 75 y.o. with above-mentioned history of left breast cancer who underwent a mastectomy and is currently on anti-estrogen therapy with tamoxifen. Mammogram on 01/24/19 showed no evidence of malignancy in the right breast. She presents to the clinic today for annual follow-up.  Since last year, she moved to an assisted living facility.  She is enjoying it very much.  Some exercise.  She tells me that [place] back to [place] and has a good friend over there.  ALLERGIES:  has No Known Allergies.  MEDICATIONS:  Current Outpatient Medications  Medication Sig Dispense Refill  . acetaminophen (TYLENOL) 500 MG tablet Take 500 mg by mouth every 6 (six) hours as needed.      Marland Kitchen amLODipine (NORVASC) 5 MG tablet Take 5 mg by mouth daily.      Marland Kitchen atenolol (TENORMIN) 50 MG tablet Take  25 mg by mouth daily.     . calcium carbonate (TUMS - DOSED IN MG ELEMENTAL CALCIUM) 500 MG chewable tablet Chew 1 tablet by mouth as needed.     . calcium-vitamin D (OSCAL WITH D) 500-200 MG-UNIT tablet Take 1 tablet by mouth daily. 90 tablet 3  . Cholecalciferol (RA VITAMIN D-3) 2000 units CAPS Take 1 capsule (2,000 Units total) by mouth daily. 90 capsule 3  . divalproex (DEPAKOTE ER) 500 MG 24 hr tablet     . latanoprost (XALATAN) 0.005 % ophthalmic solution Place 1 drop into both eyes at bedtime.    Marland Kitchen losartan (COZAAR) 100 MG tablet Take 100 mg by mouth daily.      . memantine (NAMENDA) 5 MG tablet Take 5 mg by mouth 2 (two) times daily.    . Multiple Vitamins-Minerals (MULTIVITAMIN WITH MINERALS) tablet Take 1 tablet by mouth daily.      Marland Kitchen OLANZapine (ZYPREXA) 5 MG tablet Take 5 mg by mouth at bedtime. One in the am and three at bedtime.    . pioglitazone (ACTOS) 30 MG tablet Take 15 mg by mouth daily.     Marland Kitchen RA COL-RITE 100 MG capsule Take 100 mg by mouth daily.   0  . simvastatin (ZOCOR) 10 MG tablet Take 10 mg by mouth daily.    . tamoxifen (NOLVADEX) 20  MG tablet TAKE 1 TABLET(20 MG) BY MOUTH DAILY 90 tablet 3   No current facility-administered medications for this visit.    PHYSICAL EXAMINATION: ECOG PERFORMANCE STATUS: 1 - Symptomatic but completely ambulatory  Vitals:   06/24/20 1338  BP: (!) 153/66  Pulse: 78  Resp: 17  Temp: 97.9 F (36.6 C)  SpO2: 98%   Filed Weights   06/24/20 1338  Weight: 166 lb 1.6 oz (75.3 kg)       LABORATORY DATA:  I have reviewed the data as listed CMP Latest Ref Rng & Units 06/19/2014 12/13/2013 06/13/2013  Glucose 70 - 140 mg/dl 183(H) 182(H) 188(H)  BUN 7.0 - 26.0 mg/dL 23.5 22.5 21.7  Creatinine 0.6 - 1.1 mg/dL 0.9 1.0 0.8  Sodium 136 - 145 mEq/L 139 139 139  Potassium 3.5 - 5.1 mEq/L 4.6 4.9 4.8  Chloride 96 - 112 mEq/L - - -  CO2 22 - 29 mEq/L 25 30(H) 25  Calcium 8.4 - 10.4 mg/dL 9.6 9.5 9.6  Total Protein 6.4 - 8.3 g/dL 6.1(L)  6.4 6.1(L)  Total Bilirubin 0.20 - 1.20 mg/dL 0.39 0.35 0.24  Alkaline Phos 40 - 150 U/L 56 55 45  AST 5 - 34 U/L 24 22 18   ALT 0 - 55 U/L 12 16 14     Lab Results  Component Value Date   WBC 5.2 06/19/2014   HGB 12.5 06/19/2014   HCT 37.3 06/19/2014   MCV 89.7 06/19/2014   PLT 143 (L) 06/19/2014   NEUTROABS 3.6 06/19/2014    ASSESSMENT & PLAN:  Breast cancer of upper-outer quadrant of left female breast ([facility]) Left breast cancer T2, N0, M0 stage II A. ER/PR positive HER-2 negative status post left mastectomy and currently on antiestrogen therapy with tamoxifen since 01/12/2010(plan is to treat for 10 years)  Tamoxifen toxicities: 1. Initially a lot of musculoskeletal aches and pains subsided 2. Patient stays very active by participating in [employer] and at the senior center she participates in craft making  Breast cancer surveillance: 1. MammogramOctober 2021: Benign, Breast density category C 2. Breast exam10/13/2020is normal 3. Bone density 8/9/2016showed a T score of -2.2  Osteopenia: takes bisphosphonates along with calcium and vitamin D  Return to clinic on an as needed basis since she finished 10 years of tamoxifen therapy    No orders of the defined types were placed in this encounter.  The patient has a good understanding of the overall plan. she agrees with it. she will call with any problems that may develop before the next visit here.  Total time spent: 20 mins including face to face time and time spent for planning, charting and coordination of care  Rulon Eisenmenger, MD, MPH 06/24/2020  I, Cloyde Reams Dorshimer, am acting as scribe for Dr. Nicholas Lose.  I have reviewed the above documentation for accuracy and completeness, and I agree with the above.

## 2020-06-25 DIAGNOSIS — R296 Repeated falls: Secondary | ICD-10-CM | POA: Diagnosis not present

## 2020-06-25 DIAGNOSIS — M6281 Muscle weakness (generalized): Secondary | ICD-10-CM | POA: Diagnosis not present

## 2020-06-25 DIAGNOSIS — R262 Difficulty in walking, not elsewhere classified: Secondary | ICD-10-CM | POA: Diagnosis not present

## 2020-06-25 DIAGNOSIS — N3946 Mixed incontinence: Secondary | ICD-10-CM | POA: Diagnosis not present

## 2020-06-25 DIAGNOSIS — R2681 Unsteadiness on feet: Secondary | ICD-10-CM | POA: Diagnosis not present

## 2020-06-26 DIAGNOSIS — R296 Repeated falls: Secondary | ICD-10-CM | POA: Diagnosis not present

## 2020-06-26 DIAGNOSIS — R262 Difficulty in walking, not elsewhere classified: Secondary | ICD-10-CM | POA: Diagnosis not present

## 2020-06-26 DIAGNOSIS — R2681 Unsteadiness on feet: Secondary | ICD-10-CM | POA: Diagnosis not present

## 2020-06-26 DIAGNOSIS — M6281 Muscle weakness (generalized): Secondary | ICD-10-CM | POA: Diagnosis not present

## 2020-06-26 DIAGNOSIS — N3946 Mixed incontinence: Secondary | ICD-10-CM | POA: Diagnosis not present

## 2020-06-28 DIAGNOSIS — R262 Difficulty in walking, not elsewhere classified: Secondary | ICD-10-CM | POA: Diagnosis not present

## 2020-06-28 DIAGNOSIS — M6281 Muscle weakness (generalized): Secondary | ICD-10-CM | POA: Diagnosis not present

## 2020-06-28 DIAGNOSIS — N3946 Mixed incontinence: Secondary | ICD-10-CM | POA: Diagnosis not present

## 2020-06-28 DIAGNOSIS — R296 Repeated falls: Secondary | ICD-10-CM | POA: Diagnosis not present

## 2020-06-28 DIAGNOSIS — R2681 Unsteadiness on feet: Secondary | ICD-10-CM | POA: Diagnosis not present

## 2020-07-02 DIAGNOSIS — N3946 Mixed incontinence: Secondary | ICD-10-CM | POA: Diagnosis not present

## 2020-07-02 DIAGNOSIS — R262 Difficulty in walking, not elsewhere classified: Secondary | ICD-10-CM | POA: Diagnosis not present

## 2020-07-02 DIAGNOSIS — R2681 Unsteadiness on feet: Secondary | ICD-10-CM | POA: Diagnosis not present

## 2020-07-02 DIAGNOSIS — R296 Repeated falls: Secondary | ICD-10-CM | POA: Diagnosis not present

## 2020-07-02 DIAGNOSIS — M6281 Muscle weakness (generalized): Secondary | ICD-10-CM | POA: Diagnosis not present

## 2020-07-03 DIAGNOSIS — M6281 Muscle weakness (generalized): Secondary | ICD-10-CM | POA: Diagnosis not present

## 2020-07-03 DIAGNOSIS — R296 Repeated falls: Secondary | ICD-10-CM | POA: Diagnosis not present

## 2020-07-03 DIAGNOSIS — N3946 Mixed incontinence: Secondary | ICD-10-CM | POA: Diagnosis not present

## 2020-07-03 DIAGNOSIS — R262 Difficulty in walking, not elsewhere classified: Secondary | ICD-10-CM | POA: Diagnosis not present

## 2020-07-03 DIAGNOSIS — R2681 Unsteadiness on feet: Secondary | ICD-10-CM | POA: Diagnosis not present

## 2020-07-04 DIAGNOSIS — N3946 Mixed incontinence: Secondary | ICD-10-CM | POA: Diagnosis not present

## 2020-07-04 DIAGNOSIS — R2681 Unsteadiness on feet: Secondary | ICD-10-CM | POA: Diagnosis not present

## 2020-07-04 DIAGNOSIS — R262 Difficulty in walking, not elsewhere classified: Secondary | ICD-10-CM | POA: Diagnosis not present

## 2020-07-04 DIAGNOSIS — M6281 Muscle weakness (generalized): Secondary | ICD-10-CM | POA: Diagnosis not present

## 2020-07-04 DIAGNOSIS — R296 Repeated falls: Secondary | ICD-10-CM | POA: Diagnosis not present

## 2020-07-05 DIAGNOSIS — R2681 Unsteadiness on feet: Secondary | ICD-10-CM | POA: Diagnosis not present

## 2020-07-05 DIAGNOSIS — R262 Difficulty in walking, not elsewhere classified: Secondary | ICD-10-CM | POA: Diagnosis not present

## 2020-07-05 DIAGNOSIS — N3946 Mixed incontinence: Secondary | ICD-10-CM | POA: Diagnosis not present

## 2020-07-05 DIAGNOSIS — R296 Repeated falls: Secondary | ICD-10-CM | POA: Diagnosis not present

## 2020-07-05 DIAGNOSIS — M6281 Muscle weakness (generalized): Secondary | ICD-10-CM | POA: Diagnosis not present

## 2020-07-08 DIAGNOSIS — R2681 Unsteadiness on feet: Secondary | ICD-10-CM | POA: Diagnosis not present

## 2020-07-08 DIAGNOSIS — R262 Difficulty in walking, not elsewhere classified: Secondary | ICD-10-CM | POA: Diagnosis not present

## 2020-07-08 DIAGNOSIS — N3946 Mixed incontinence: Secondary | ICD-10-CM | POA: Diagnosis not present

## 2020-07-08 DIAGNOSIS — R296 Repeated falls: Secondary | ICD-10-CM | POA: Diagnosis not present

## 2020-07-08 DIAGNOSIS — M6281 Muscle weakness (generalized): Secondary | ICD-10-CM | POA: Diagnosis not present

## 2020-07-09 DIAGNOSIS — N3946 Mixed incontinence: Secondary | ICD-10-CM | POA: Diagnosis not present

## 2020-07-09 DIAGNOSIS — R2681 Unsteadiness on feet: Secondary | ICD-10-CM | POA: Diagnosis not present

## 2020-07-09 DIAGNOSIS — R296 Repeated falls: Secondary | ICD-10-CM | POA: Diagnosis not present

## 2020-07-09 DIAGNOSIS — R262 Difficulty in walking, not elsewhere classified: Secondary | ICD-10-CM | POA: Diagnosis not present

## 2020-07-09 DIAGNOSIS — M6281 Muscle weakness (generalized): Secondary | ICD-10-CM | POA: Diagnosis not present

## 2020-07-10 DIAGNOSIS — M6281 Muscle weakness (generalized): Secondary | ICD-10-CM | POA: Diagnosis not present

## 2020-07-10 DIAGNOSIS — R2681 Unsteadiness on feet: Secondary | ICD-10-CM | POA: Diagnosis not present

## 2020-07-10 DIAGNOSIS — R296 Repeated falls: Secondary | ICD-10-CM | POA: Diagnosis not present

## 2020-07-10 DIAGNOSIS — R262 Difficulty in walking, not elsewhere classified: Secondary | ICD-10-CM | POA: Diagnosis not present

## 2020-07-10 DIAGNOSIS — N3946 Mixed incontinence: Secondary | ICD-10-CM | POA: Diagnosis not present

## 2020-07-11 DIAGNOSIS — R262 Difficulty in walking, not elsewhere classified: Secondary | ICD-10-CM | POA: Diagnosis not present

## 2020-07-11 DIAGNOSIS — M6281 Muscle weakness (generalized): Secondary | ICD-10-CM | POA: Diagnosis not present

## 2020-07-11 DIAGNOSIS — R296 Repeated falls: Secondary | ICD-10-CM | POA: Diagnosis not present

## 2020-07-11 DIAGNOSIS — R2681 Unsteadiness on feet: Secondary | ICD-10-CM | POA: Diagnosis not present

## 2020-07-11 DIAGNOSIS — N3946 Mixed incontinence: Secondary | ICD-10-CM | POA: Diagnosis not present

## 2020-07-15 DIAGNOSIS — N3946 Mixed incontinence: Secondary | ICD-10-CM | POA: Diagnosis not present

## 2020-07-15 DIAGNOSIS — R262 Difficulty in walking, not elsewhere classified: Secondary | ICD-10-CM | POA: Diagnosis not present

## 2020-07-15 DIAGNOSIS — R2681 Unsteadiness on feet: Secondary | ICD-10-CM | POA: Diagnosis not present

## 2020-07-15 DIAGNOSIS — R296 Repeated falls: Secondary | ICD-10-CM | POA: Diagnosis not present

## 2020-07-15 DIAGNOSIS — M6281 Muscle weakness (generalized): Secondary | ICD-10-CM | POA: Diagnosis not present

## 2020-07-17 DIAGNOSIS — R2681 Unsteadiness on feet: Secondary | ICD-10-CM | POA: Diagnosis not present

## 2020-07-17 DIAGNOSIS — M6281 Muscle weakness (generalized): Secondary | ICD-10-CM | POA: Diagnosis not present

## 2020-07-17 DIAGNOSIS — N3946 Mixed incontinence: Secondary | ICD-10-CM | POA: Diagnosis not present

## 2020-07-17 DIAGNOSIS — R262 Difficulty in walking, not elsewhere classified: Secondary | ICD-10-CM | POA: Diagnosis not present

## 2020-07-17 DIAGNOSIS — R296 Repeated falls: Secondary | ICD-10-CM | POA: Diagnosis not present

## 2020-07-18 DIAGNOSIS — H2511 Age-related nuclear cataract, right eye: Secondary | ICD-10-CM | POA: Diagnosis not present

## 2020-07-18 DIAGNOSIS — H2521 Age-related cataract, morgagnian type, right eye: Secondary | ICD-10-CM | POA: Diagnosis not present

## 2020-07-18 DIAGNOSIS — H524 Presbyopia: Secondary | ICD-10-CM | POA: Diagnosis not present

## 2020-07-19 DIAGNOSIS — R2681 Unsteadiness on feet: Secondary | ICD-10-CM | POA: Diagnosis not present

## 2020-07-19 DIAGNOSIS — R262 Difficulty in walking, not elsewhere classified: Secondary | ICD-10-CM | POA: Diagnosis not present

## 2020-07-19 DIAGNOSIS — N3946 Mixed incontinence: Secondary | ICD-10-CM | POA: Diagnosis not present

## 2020-07-19 DIAGNOSIS — M6281 Muscle weakness (generalized): Secondary | ICD-10-CM | POA: Diagnosis not present

## 2020-07-19 DIAGNOSIS — R296 Repeated falls: Secondary | ICD-10-CM | POA: Diagnosis not present

## 2020-07-22 DIAGNOSIS — R296 Repeated falls: Secondary | ICD-10-CM | POA: Diagnosis not present

## 2020-07-22 DIAGNOSIS — R262 Difficulty in walking, not elsewhere classified: Secondary | ICD-10-CM | POA: Diagnosis not present

## 2020-07-22 DIAGNOSIS — M6281 Muscle weakness (generalized): Secondary | ICD-10-CM | POA: Diagnosis not present

## 2020-07-22 DIAGNOSIS — N3946 Mixed incontinence: Secondary | ICD-10-CM | POA: Diagnosis not present

## 2020-07-22 DIAGNOSIS — R2681 Unsteadiness on feet: Secondary | ICD-10-CM | POA: Diagnosis not present

## 2020-07-23 DIAGNOSIS — R296 Repeated falls: Secondary | ICD-10-CM | POA: Diagnosis not present

## 2020-07-23 DIAGNOSIS — R2681 Unsteadiness on feet: Secondary | ICD-10-CM | POA: Diagnosis not present

## 2020-07-23 DIAGNOSIS — R262 Difficulty in walking, not elsewhere classified: Secondary | ICD-10-CM | POA: Diagnosis not present

## 2020-07-23 DIAGNOSIS — N3946 Mixed incontinence: Secondary | ICD-10-CM | POA: Diagnosis not present

## 2020-07-23 DIAGNOSIS — M6281 Muscle weakness (generalized): Secondary | ICD-10-CM | POA: Diagnosis not present

## 2020-07-24 DIAGNOSIS — R2681 Unsteadiness on feet: Secondary | ICD-10-CM | POA: Diagnosis not present

## 2020-07-24 DIAGNOSIS — R296 Repeated falls: Secondary | ICD-10-CM | POA: Diagnosis not present

## 2020-07-24 DIAGNOSIS — R262 Difficulty in walking, not elsewhere classified: Secondary | ICD-10-CM | POA: Diagnosis not present

## 2020-07-24 DIAGNOSIS — N3946 Mixed incontinence: Secondary | ICD-10-CM | POA: Diagnosis not present

## 2020-07-24 DIAGNOSIS — M6281 Muscle weakness (generalized): Secondary | ICD-10-CM | POA: Diagnosis not present

## 2020-07-25 DIAGNOSIS — R2681 Unsteadiness on feet: Secondary | ICD-10-CM | POA: Diagnosis not present

## 2020-07-25 DIAGNOSIS — M6281 Muscle weakness (generalized): Secondary | ICD-10-CM | POA: Diagnosis not present

## 2020-07-25 DIAGNOSIS — N3946 Mixed incontinence: Secondary | ICD-10-CM | POA: Diagnosis not present

## 2020-07-25 DIAGNOSIS — R262 Difficulty in walking, not elsewhere classified: Secondary | ICD-10-CM | POA: Diagnosis not present

## 2020-07-25 DIAGNOSIS — R296 Repeated falls: Secondary | ICD-10-CM | POA: Diagnosis not present

## 2020-07-29 DIAGNOSIS — R262 Difficulty in walking, not elsewhere classified: Secondary | ICD-10-CM | POA: Diagnosis not present

## 2020-07-29 DIAGNOSIS — R2681 Unsteadiness on feet: Secondary | ICD-10-CM | POA: Diagnosis not present

## 2020-07-29 DIAGNOSIS — M6281 Muscle weakness (generalized): Secondary | ICD-10-CM | POA: Diagnosis not present

## 2020-07-29 DIAGNOSIS — N3946 Mixed incontinence: Secondary | ICD-10-CM | POA: Diagnosis not present

## 2020-07-29 DIAGNOSIS — R296 Repeated falls: Secondary | ICD-10-CM | POA: Diagnosis not present

## 2020-07-30 DIAGNOSIS — N3946 Mixed incontinence: Secondary | ICD-10-CM | POA: Diagnosis not present

## 2020-07-30 DIAGNOSIS — R2681 Unsteadiness on feet: Secondary | ICD-10-CM | POA: Diagnosis not present

## 2020-07-30 DIAGNOSIS — R296 Repeated falls: Secondary | ICD-10-CM | POA: Diagnosis not present

## 2020-07-30 DIAGNOSIS — M6281 Muscle weakness (generalized): Secondary | ICD-10-CM | POA: Diagnosis not present

## 2020-07-30 DIAGNOSIS — R262 Difficulty in walking, not elsewhere classified: Secondary | ICD-10-CM | POA: Diagnosis not present

## 2020-07-31 DIAGNOSIS — R262 Difficulty in walking, not elsewhere classified: Secondary | ICD-10-CM | POA: Diagnosis not present

## 2020-07-31 DIAGNOSIS — R296 Repeated falls: Secondary | ICD-10-CM | POA: Diagnosis not present

## 2020-07-31 DIAGNOSIS — R2681 Unsteadiness on feet: Secondary | ICD-10-CM | POA: Diagnosis not present

## 2020-07-31 DIAGNOSIS — N3946 Mixed incontinence: Secondary | ICD-10-CM | POA: Diagnosis not present

## 2020-07-31 DIAGNOSIS — M6281 Muscle weakness (generalized): Secondary | ICD-10-CM | POA: Diagnosis not present

## 2020-08-01 DIAGNOSIS — R2681 Unsteadiness on feet: Secondary | ICD-10-CM | POA: Diagnosis not present

## 2020-08-01 DIAGNOSIS — R296 Repeated falls: Secondary | ICD-10-CM | POA: Diagnosis not present

## 2020-08-01 DIAGNOSIS — R262 Difficulty in walking, not elsewhere classified: Secondary | ICD-10-CM | POA: Diagnosis not present

## 2020-08-01 DIAGNOSIS — N3946 Mixed incontinence: Secondary | ICD-10-CM | POA: Diagnosis not present

## 2020-08-01 DIAGNOSIS — M6281 Muscle weakness (generalized): Secondary | ICD-10-CM | POA: Diagnosis not present

## 2020-08-02 DIAGNOSIS — M6281 Muscle weakness (generalized): Secondary | ICD-10-CM | POA: Diagnosis not present

## 2020-08-02 DIAGNOSIS — R2681 Unsteadiness on feet: Secondary | ICD-10-CM | POA: Diagnosis not present

## 2020-08-02 DIAGNOSIS — R296 Repeated falls: Secondary | ICD-10-CM | POA: Diagnosis not present

## 2020-08-02 DIAGNOSIS — N3946 Mixed incontinence: Secondary | ICD-10-CM | POA: Diagnosis not present

## 2020-08-02 DIAGNOSIS — R262 Difficulty in walking, not elsewhere classified: Secondary | ICD-10-CM | POA: Diagnosis not present

## 2020-08-05 DIAGNOSIS — R2681 Unsteadiness on feet: Secondary | ICD-10-CM | POA: Diagnosis not present

## 2020-08-05 DIAGNOSIS — F200 Paranoid schizophrenia: Secondary | ICD-10-CM | POA: Diagnosis not present

## 2020-08-05 DIAGNOSIS — R296 Repeated falls: Secondary | ICD-10-CM | POA: Diagnosis not present

## 2020-08-05 DIAGNOSIS — G3184 Mild cognitive impairment, so stated: Secondary | ICD-10-CM | POA: Diagnosis not present

## 2020-08-05 DIAGNOSIS — N3946 Mixed incontinence: Secondary | ICD-10-CM | POA: Diagnosis not present

## 2020-08-05 DIAGNOSIS — M6281 Muscle weakness (generalized): Secondary | ICD-10-CM | POA: Diagnosis not present

## 2020-08-05 DIAGNOSIS — R262 Difficulty in walking, not elsewhere classified: Secondary | ICD-10-CM | POA: Diagnosis not present

## 2020-08-06 DIAGNOSIS — M6281 Muscle weakness (generalized): Secondary | ICD-10-CM | POA: Diagnosis not present

## 2020-08-06 DIAGNOSIS — R296 Repeated falls: Secondary | ICD-10-CM | POA: Diagnosis not present

## 2020-08-06 DIAGNOSIS — R262 Difficulty in walking, not elsewhere classified: Secondary | ICD-10-CM | POA: Diagnosis not present

## 2020-08-06 DIAGNOSIS — R2681 Unsteadiness on feet: Secondary | ICD-10-CM | POA: Diagnosis not present

## 2020-08-06 DIAGNOSIS — N3946 Mixed incontinence: Secondary | ICD-10-CM | POA: Diagnosis not present

## 2020-08-08 DIAGNOSIS — R296 Repeated falls: Secondary | ICD-10-CM | POA: Diagnosis not present

## 2020-08-08 DIAGNOSIS — M6281 Muscle weakness (generalized): Secondary | ICD-10-CM | POA: Diagnosis not present

## 2020-08-08 DIAGNOSIS — N3946 Mixed incontinence: Secondary | ICD-10-CM | POA: Diagnosis not present

## 2020-08-08 DIAGNOSIS — R262 Difficulty in walking, not elsewhere classified: Secondary | ICD-10-CM | POA: Diagnosis not present

## 2020-08-08 DIAGNOSIS — R2681 Unsteadiness on feet: Secondary | ICD-10-CM | POA: Diagnosis not present

## 2020-08-09 DIAGNOSIS — R2681 Unsteadiness on feet: Secondary | ICD-10-CM | POA: Diagnosis not present

## 2020-08-09 DIAGNOSIS — R296 Repeated falls: Secondary | ICD-10-CM | POA: Diagnosis not present

## 2020-08-09 DIAGNOSIS — N3946 Mixed incontinence: Secondary | ICD-10-CM | POA: Diagnosis not present

## 2020-08-09 DIAGNOSIS — M6281 Muscle weakness (generalized): Secondary | ICD-10-CM | POA: Diagnosis not present

## 2020-08-09 DIAGNOSIS — R262 Difficulty in walking, not elsewhere classified: Secondary | ICD-10-CM | POA: Diagnosis not present

## 2020-08-12 DIAGNOSIS — M6281 Muscle weakness (generalized): Secondary | ICD-10-CM | POA: Diagnosis not present

## 2020-08-12 DIAGNOSIS — R2681 Unsteadiness on feet: Secondary | ICD-10-CM | POA: Diagnosis not present

## 2020-08-12 DIAGNOSIS — R262 Difficulty in walking, not elsewhere classified: Secondary | ICD-10-CM | POA: Diagnosis not present

## 2020-08-12 DIAGNOSIS — R296 Repeated falls: Secondary | ICD-10-CM | POA: Diagnosis not present

## 2020-08-12 DIAGNOSIS — N3946 Mixed incontinence: Secondary | ICD-10-CM | POA: Diagnosis not present

## 2020-08-13 DIAGNOSIS — R262 Difficulty in walking, not elsewhere classified: Secondary | ICD-10-CM | POA: Diagnosis not present

## 2020-08-13 DIAGNOSIS — M6281 Muscle weakness (generalized): Secondary | ICD-10-CM | POA: Diagnosis not present

## 2020-08-13 DIAGNOSIS — E039 Hypothyroidism, unspecified: Secondary | ICD-10-CM | POA: Diagnosis not present

## 2020-08-13 DIAGNOSIS — R2681 Unsteadiness on feet: Secondary | ICD-10-CM | POA: Diagnosis not present

## 2020-08-13 DIAGNOSIS — N3946 Mixed incontinence: Secondary | ICD-10-CM | POA: Diagnosis not present

## 2020-08-13 DIAGNOSIS — R296 Repeated falls: Secondary | ICD-10-CM | POA: Diagnosis not present

## 2020-08-15 DIAGNOSIS — R296 Repeated falls: Secondary | ICD-10-CM | POA: Diagnosis not present

## 2020-08-15 DIAGNOSIS — R2681 Unsteadiness on feet: Secondary | ICD-10-CM | POA: Diagnosis not present

## 2020-08-15 DIAGNOSIS — R262 Difficulty in walking, not elsewhere classified: Secondary | ICD-10-CM | POA: Diagnosis not present

## 2020-08-15 DIAGNOSIS — N3946 Mixed incontinence: Secondary | ICD-10-CM | POA: Diagnosis not present

## 2020-08-15 DIAGNOSIS — M6281 Muscle weakness (generalized): Secondary | ICD-10-CM | POA: Diagnosis not present

## 2020-08-16 DIAGNOSIS — E039 Hypothyroidism, unspecified: Secondary | ICD-10-CM | POA: Diagnosis not present

## 2020-08-16 DIAGNOSIS — L03119 Cellulitis of unspecified part of limb: Secondary | ICD-10-CM | POA: Diagnosis not present

## 2020-08-16 DIAGNOSIS — R0600 Dyspnea, unspecified: Secondary | ICD-10-CM | POA: Diagnosis not present

## 2020-08-16 DIAGNOSIS — R262 Difficulty in walking, not elsewhere classified: Secondary | ICD-10-CM | POA: Diagnosis not present

## 2020-08-16 DIAGNOSIS — R296 Repeated falls: Secondary | ICD-10-CM | POA: Diagnosis not present

## 2020-08-16 DIAGNOSIS — N3946 Mixed incontinence: Secondary | ICD-10-CM | POA: Diagnosis not present

## 2020-08-16 DIAGNOSIS — R2681 Unsteadiness on feet: Secondary | ICD-10-CM | POA: Diagnosis not present

## 2020-08-16 DIAGNOSIS — R609 Edema, unspecified: Secondary | ICD-10-CM | POA: Diagnosis not present

## 2020-08-16 DIAGNOSIS — M6281 Muscle weakness (generalized): Secondary | ICD-10-CM | POA: Diagnosis not present

## 2020-08-19 ENCOUNTER — Ambulatory Visit: Payer: Medicare Other | Admitting: Adult Health

## 2020-08-19 DIAGNOSIS — R296 Repeated falls: Secondary | ICD-10-CM | POA: Diagnosis not present

## 2020-08-19 DIAGNOSIS — N3946 Mixed incontinence: Secondary | ICD-10-CM | POA: Diagnosis not present

## 2020-08-19 DIAGNOSIS — M6281 Muscle weakness (generalized): Secondary | ICD-10-CM | POA: Diagnosis not present

## 2020-08-19 DIAGNOSIS — R2681 Unsteadiness on feet: Secondary | ICD-10-CM | POA: Diagnosis not present

## 2020-08-19 DIAGNOSIS — R262 Difficulty in walking, not elsewhere classified: Secondary | ICD-10-CM | POA: Diagnosis not present

## 2020-08-20 DIAGNOSIS — R2681 Unsteadiness on feet: Secondary | ICD-10-CM | POA: Diagnosis not present

## 2020-08-20 DIAGNOSIS — R296 Repeated falls: Secondary | ICD-10-CM | POA: Diagnosis not present

## 2020-08-20 DIAGNOSIS — R262 Difficulty in walking, not elsewhere classified: Secondary | ICD-10-CM | POA: Diagnosis not present

## 2020-08-20 DIAGNOSIS — N3946 Mixed incontinence: Secondary | ICD-10-CM | POA: Diagnosis not present

## 2020-08-20 DIAGNOSIS — M6281 Muscle weakness (generalized): Secondary | ICD-10-CM | POA: Diagnosis not present

## 2020-08-21 DIAGNOSIS — R262 Difficulty in walking, not elsewhere classified: Secondary | ICD-10-CM | POA: Diagnosis not present

## 2020-08-21 DIAGNOSIS — R296 Repeated falls: Secondary | ICD-10-CM | POA: Diagnosis not present

## 2020-08-21 DIAGNOSIS — M6281 Muscle weakness (generalized): Secondary | ICD-10-CM | POA: Diagnosis not present

## 2020-08-21 DIAGNOSIS — N3946 Mixed incontinence: Secondary | ICD-10-CM | POA: Diagnosis not present

## 2020-08-21 DIAGNOSIS — R2681 Unsteadiness on feet: Secondary | ICD-10-CM | POA: Diagnosis not present

## 2020-08-22 DIAGNOSIS — M6281 Muscle weakness (generalized): Secondary | ICD-10-CM | POA: Diagnosis not present

## 2020-08-22 DIAGNOSIS — R296 Repeated falls: Secondary | ICD-10-CM | POA: Diagnosis not present

## 2020-08-22 DIAGNOSIS — R2681 Unsteadiness on feet: Secondary | ICD-10-CM | POA: Diagnosis not present

## 2020-08-22 DIAGNOSIS — R262 Difficulty in walking, not elsewhere classified: Secondary | ICD-10-CM | POA: Diagnosis not present

## 2020-08-22 DIAGNOSIS — N3946 Mixed incontinence: Secondary | ICD-10-CM | POA: Diagnosis not present

## 2020-08-23 DIAGNOSIS — R2681 Unsteadiness on feet: Secondary | ICD-10-CM | POA: Diagnosis not present

## 2020-08-23 DIAGNOSIS — N3946 Mixed incontinence: Secondary | ICD-10-CM | POA: Diagnosis not present

## 2020-08-23 DIAGNOSIS — R262 Difficulty in walking, not elsewhere classified: Secondary | ICD-10-CM | POA: Diagnosis not present

## 2020-08-23 DIAGNOSIS — M6281 Muscle weakness (generalized): Secondary | ICD-10-CM | POA: Diagnosis not present

## 2020-08-23 DIAGNOSIS — R296 Repeated falls: Secondary | ICD-10-CM | POA: Diagnosis not present

## 2020-08-26 DIAGNOSIS — R296 Repeated falls: Secondary | ICD-10-CM | POA: Diagnosis not present

## 2020-08-26 DIAGNOSIS — N3946 Mixed incontinence: Secondary | ICD-10-CM | POA: Diagnosis not present

## 2020-08-26 DIAGNOSIS — M6281 Muscle weakness (generalized): Secondary | ICD-10-CM | POA: Diagnosis not present

## 2020-08-26 DIAGNOSIS — R262 Difficulty in walking, not elsewhere classified: Secondary | ICD-10-CM | POA: Diagnosis not present

## 2020-08-26 DIAGNOSIS — R2681 Unsteadiness on feet: Secondary | ICD-10-CM | POA: Diagnosis not present

## 2020-08-28 DIAGNOSIS — N3946 Mixed incontinence: Secondary | ICD-10-CM | POA: Diagnosis not present

## 2020-08-28 DIAGNOSIS — R2681 Unsteadiness on feet: Secondary | ICD-10-CM | POA: Diagnosis not present

## 2020-08-28 DIAGNOSIS — R296 Repeated falls: Secondary | ICD-10-CM | POA: Diagnosis not present

## 2020-08-28 DIAGNOSIS — M6281 Muscle weakness (generalized): Secondary | ICD-10-CM | POA: Diagnosis not present

## 2020-08-28 DIAGNOSIS — R262 Difficulty in walking, not elsewhere classified: Secondary | ICD-10-CM | POA: Diagnosis not present

## 2020-08-29 DIAGNOSIS — R296 Repeated falls: Secondary | ICD-10-CM | POA: Diagnosis not present

## 2020-08-29 DIAGNOSIS — R2681 Unsteadiness on feet: Secondary | ICD-10-CM | POA: Diagnosis not present

## 2020-08-29 DIAGNOSIS — R262 Difficulty in walking, not elsewhere classified: Secondary | ICD-10-CM | POA: Diagnosis not present

## 2020-08-29 DIAGNOSIS — N3946 Mixed incontinence: Secondary | ICD-10-CM | POA: Diagnosis not present

## 2020-08-29 DIAGNOSIS — M6281 Muscle weakness (generalized): Secondary | ICD-10-CM | POA: Diagnosis not present

## 2020-08-30 DIAGNOSIS — N3946 Mixed incontinence: Secondary | ICD-10-CM | POA: Diagnosis not present

## 2020-08-30 DIAGNOSIS — M6281 Muscle weakness (generalized): Secondary | ICD-10-CM | POA: Diagnosis not present

## 2020-08-30 DIAGNOSIS — R2681 Unsteadiness on feet: Secondary | ICD-10-CM | POA: Diagnosis not present

## 2020-08-30 DIAGNOSIS — R262 Difficulty in walking, not elsewhere classified: Secondary | ICD-10-CM | POA: Diagnosis not present

## 2020-08-30 DIAGNOSIS — R296 Repeated falls: Secondary | ICD-10-CM | POA: Diagnosis not present

## 2020-09-02 DIAGNOSIS — R262 Difficulty in walking, not elsewhere classified: Secondary | ICD-10-CM | POA: Diagnosis not present

## 2020-09-02 DIAGNOSIS — N3946 Mixed incontinence: Secondary | ICD-10-CM | POA: Diagnosis not present

## 2020-09-02 DIAGNOSIS — M6281 Muscle weakness (generalized): Secondary | ICD-10-CM | POA: Diagnosis not present

## 2020-09-02 DIAGNOSIS — R296 Repeated falls: Secondary | ICD-10-CM | POA: Diagnosis not present

## 2020-09-02 DIAGNOSIS — R2681 Unsteadiness on feet: Secondary | ICD-10-CM | POA: Diagnosis not present

## 2020-09-03 DIAGNOSIS — Z7984 Long term (current) use of oral hypoglycemic drugs: Secondary | ICD-10-CM | POA: Diagnosis not present

## 2020-09-03 DIAGNOSIS — I10 Essential (primary) hypertension: Secondary | ICD-10-CM | POA: Diagnosis not present

## 2020-09-03 DIAGNOSIS — E1169 Type 2 diabetes mellitus with other specified complication: Secondary | ICD-10-CM | POA: Diagnosis not present

## 2020-09-03 DIAGNOSIS — R600 Localized edema: Secondary | ICD-10-CM | POA: Diagnosis not present

## 2020-09-04 ENCOUNTER — Telehealth: Payer: Self-pay | Admitting: *Deleted

## 2020-09-04 DIAGNOSIS — R2681 Unsteadiness on feet: Secondary | ICD-10-CM | POA: Diagnosis not present

## 2020-09-04 DIAGNOSIS — N3946 Mixed incontinence: Secondary | ICD-10-CM | POA: Diagnosis not present

## 2020-09-04 DIAGNOSIS — R262 Difficulty in walking, not elsewhere classified: Secondary | ICD-10-CM | POA: Diagnosis not present

## 2020-09-04 DIAGNOSIS — M6281 Muscle weakness (generalized): Secondary | ICD-10-CM | POA: Diagnosis not present

## 2020-09-04 DIAGNOSIS — R296 Repeated falls: Secondary | ICD-10-CM | POA: Diagnosis not present

## 2020-09-04 NOTE — Telephone Encounter (Signed)
Received call from pt legal guardian Arbie Cookey requesting advice from MD.  States pt is experiencing bilateral lower extremity edema.  Pt seen by PCP Dr. Dema Severin and started on Lasix with minimum relief.  Per MD pt with hx of heart failure and needing to continue to f/u with PCP.  Verbalized understanding and appreciative of advice.

## 2020-09-05 ENCOUNTER — Encounter (HOSPITAL_COMMUNITY): Payer: Self-pay

## 2020-09-05 ENCOUNTER — Emergency Department (HOSPITAL_COMMUNITY): Payer: Medicare Other

## 2020-09-05 ENCOUNTER — Emergency Department (HOSPITAL_COMMUNITY)
Admission: EM | Admit: 2020-09-05 | Discharge: 2020-09-05 | Disposition: A | Discharge status: Home / Self Care | Payer: Medicare Other | Attending: Emergency Medicine | Admitting: Emergency Medicine

## 2020-09-05 ENCOUNTER — Emergency Department (HOSPITAL_BASED_OUTPATIENT_CLINIC_OR_DEPARTMENT_OTHER): Payer: Medicare Other

## 2020-09-05 ENCOUNTER — Other Ambulatory Visit: Payer: Self-pay

## 2020-09-05 DIAGNOSIS — L539 Erythematous condition, unspecified: Secondary | ICD-10-CM | POA: Insufficient documentation

## 2020-09-05 DIAGNOSIS — I5032 Chronic diastolic (congestive) heart failure: Secondary | ICD-10-CM | POA: Insufficient documentation

## 2020-09-05 DIAGNOSIS — Z853 Personal history of malignant neoplasm of breast: Secondary | ICD-10-CM | POA: Insufficient documentation

## 2020-09-05 DIAGNOSIS — I110 Hypertensive heart disease with heart failure: Secondary | ICD-10-CM | POA: Diagnosis not present

## 2020-09-05 DIAGNOSIS — R600 Localized edema: Secondary | ICD-10-CM | POA: Diagnosis not present

## 2020-09-05 DIAGNOSIS — Z7984 Long term (current) use of oral hypoglycemic drugs: Secondary | ICD-10-CM | POA: Insufficient documentation

## 2020-09-05 DIAGNOSIS — R609 Edema, unspecified: Secondary | ICD-10-CM | POA: Diagnosis not present

## 2020-09-05 DIAGNOSIS — Z79899 Other long term (current) drug therapy: Secondary | ICD-10-CM | POA: Diagnosis not present

## 2020-09-05 DIAGNOSIS — E785 Hyperlipidemia, unspecified: Secondary | ICD-10-CM | POA: Insufficient documentation

## 2020-09-05 DIAGNOSIS — R0602 Shortness of breath: Secondary | ICD-10-CM | POA: Diagnosis not present

## 2020-09-05 DIAGNOSIS — R2243 Localized swelling, mass and lump, lower limb, bilateral: Secondary | ICD-10-CM | POA: Diagnosis present

## 2020-09-05 DIAGNOSIS — E1169 Type 2 diabetes mellitus with other specified complication: Secondary | ICD-10-CM | POA: Insufficient documentation

## 2020-09-05 LAB — URINALYSIS, ROUTINE W REFLEX MICROSCOPIC
Bacteria, UA: NONE SEEN
Bilirubin Urine: NEGATIVE
Glucose, UA: >=500 mg/dL — AB
Hgb urine dipstick: NEGATIVE
Ketones, ur: NEGATIVE mg/dL
Nitrite: NEGATIVE
Protein, ur: NEGATIVE mg/dL
Specific Gravity, Urine: 1.007 (ref 1.005–1.030)
pH: 6 (ref 5.0–8.0)

## 2020-09-05 LAB — SODIUM, URINE, RANDOM: Sodium, Ur: 111 mmol/L

## 2020-09-05 LAB — CBC WITH DIFFERENTIAL/PLATELET
Abs Immature Granulocytes: 0.05 10*3/uL (ref 0.00–0.07)
Basophils Absolute: 0 10*3/uL (ref 0.0–0.1)
Basophils Relative: 1 %
Eosinophils Absolute: 0.1 10*3/uL (ref 0.0–0.5)
Eosinophils Relative: 1 %
HCT: 37.7 % (ref 36.0–46.0)
Hemoglobin: 12.3 g/dL (ref 12.0–15.0)
Immature Granulocytes: 1 %
Lymphocytes Relative: 23 %
Lymphs Abs: 1.3 10*3/uL (ref 0.7–4.0)
MCH: 29.9 pg (ref 26.0–34.0)
MCHC: 32.6 g/dL (ref 30.0–36.0)
MCV: 91.5 fL (ref 80.0–100.0)
Monocytes Absolute: 0.6 10*3/uL (ref 0.1–1.0)
Monocytes Relative: 10 %
Neutro Abs: 3.6 10*3/uL (ref 1.7–7.7)
Neutrophils Relative %: 64 %
Platelets: 182 10*3/uL (ref 150–400)
RBC: 4.12 MIL/uL (ref 3.87–5.11)
RDW: 12.5 % (ref 11.5–15.5)
WBC: 5.6 10*3/uL (ref 4.0–10.5)
nRBC: 0 % (ref 0.0–0.2)

## 2020-09-05 LAB — COMPREHENSIVE METABOLIC PANEL
ALT: 13 U/L (ref 0–44)
AST: 17 U/L (ref 15–41)
Albumin: 3.7 g/dL (ref 3.5–5.0)
Alkaline Phosphatase: 61 U/L (ref 38–126)
Anion gap: 9 (ref 5–15)
BUN: 40 mg/dL — ABNORMAL HIGH (ref 8–23)
CO2: 29 mmol/L (ref 22–32)
Calcium: 9.6 mg/dL (ref 8.9–10.3)
Chloride: 99 mmol/L (ref 98–111)
Creatinine, Ser: 1.24 mg/dL — ABNORMAL HIGH (ref 0.44–1.00)
GFR, Estimated: 45 mL/min — ABNORMAL LOW (ref >60)
Glucose, Bld: 358 mg/dL — ABNORMAL HIGH (ref 70–99)
Potassium: 4.5 mmol/L (ref 3.5–5.1)
Sodium: 137 mmol/L (ref 135–145)
Total Bilirubin: 0.3 mg/dL (ref 0.3–1.2)
Total Protein: 6.7 g/dL (ref 6.5–8.1)

## 2020-09-05 LAB — CREATININE, URINE, RANDOM: Creatinine, Urine: 36.28 mg/dL

## 2020-09-05 LAB — BRAIN NATRIURETIC PEPTIDE: B Natriuretic Peptide: 43.8 pg/mL (ref 0.0–100.0)

## 2020-09-05 LAB — TSH: TSH: 5.311 u[IU]/mL — ABNORMAL HIGH (ref 0.350–4.500)

## 2020-09-05 MED ORDER — FUROSEMIDE 10 MG/ML IJ SOLN
40.0000 mg | Freq: Once | INTRAMUSCULAR | Status: AC
  Administered 2020-09-05: 40 mg via INTRAVENOUS
  Filled 2020-09-05: qty 4

## 2020-09-05 NOTE — ED Notes (Signed)
Ultrasound to bedside at this time, patient provided with bedside commode

## 2020-09-05 NOTE — Progress Notes (Signed)
BLE venous duplex has been completed. Preliminary results given to Dr. Rex Kras at 1450.  Results can be found under chart review under CV PROC. 09/05/2020 3:24 PM Zian Delair RVT, RDMS

## 2020-09-05 NOTE — ED Notes (Signed)
Will continue triage when the legal guardian parks her car. The patient is unable to answer questions in triage.

## 2020-09-05 NOTE — Discharge Instructions (Addendum)
Please discontinue your furosemide  (Lasix) until you follow-up with your primary care doctor.  Also, ask your primary care doctor to adjust your diabetes medication as your blood sugar is currently uncontrolled.  Use compression stocking to help with your lower extremity edema.

## 2020-09-05 NOTE — ED Provider Notes (Signed)
[facility] DEPT Provider Note   CSN: [id number] Arrival date & time: 09/05/20  1135     History Chief Complaint  Patient presents with  . Leg Swelling    Alexandra Lamb is a 75 y.o. female with PMH of HLD, type 2 diabetes, HTN, diastolic heart failure, schizophrenia and breast cancer who presented from assisted living facility for evaluation of lower extremity swelling. Per guardian, patient has had progressive lower extremity swelling for the past few weeks.  Her PCP has increased her Lasix from 20 mg to 80 mg with no significant change.  She has had associated redness on her leg since yesterday. Patient denies any dyspnea on exertion, chest pain, dizziness, leg pain, headache, abdominal pain or dysuria.  Per guardian, patient has been immobile recently. Patient just completed 10 years of breast cancer treatment with tamoxifen week ago.    Past Medical History:  Diagnosis Date  . Cancer ([place])    left breast  . Diabetes mellitus   . Hyperlipidemia   . Hypertension   . MCI (mild cognitive impairment)   . 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 Cognitive impairment 02/19/2020  . Breast cancer of upper-outer quadrant of left female breast ([place]) 05/14/2011  . Vitamin D deficiency 04/02/2011  . DIASTOLIC HEART FAILURE, CHRONIC 07/10/2009  . DIABETES MELLITUS, TYPE II 06/18/2009  . HYPERLIPIDEMIA 06/18/2009  . SCHIZOPHRENIA 06/18/2009  . HYPERTENSION 06/18/2009  . MI 06/18/2009  . RESPIRATORY FAILURE, ACUTE 06/18/2009  . DYSPNEA ON EXERTION 06/18/2009    Past Surgical History:  Procedure Laterality Date  . BREAST SURGERY  2011   left breast mast  . MASTECTOMY     left  . TOOTH EXTRACTION       OB History   No obstetric history on file.     Family History  Problem Relation Age of Onset  . Cancer Maternal Aunt        brain tumor  . Dementia Neg Hx     Social History   Tobacco Use   . Smoking status: Former Smoker    Quit date: 04/21/2007    Years since quitting: 13.3  . Smokeless tobacco: Never Used  Vaping Use  . Vaping Use: Never used  Substance Use Topics  . Alcohol use: No  . Drug use: No    Home Medications Prior to Admission medications   Medication Sig Start Date End Date Taking? Authorizing Provider  acetaminophen (TYLENOL) 500 MG tablet Take 500 mg by mouth every 6 (six) hours as needed.      [provider]  amLODipine (NORVASC) 5 MG tablet Take 5 mg by mouth daily.      [provider]  atenolol (TENORMIN) 50 MG tablet Take 25 mg by mouth daily.     [provider]  calcium carbonate (TUMS - DOSED IN MG ELEMENTAL CALCIUM) 500 MG chewable tablet Chew 1 tablet by mouth as needed.     [provider]  calcium-vitamin D (OSCAL WITH D) 500-200 MG-UNIT tablet Take 1 tablet by mouth daily. 12/22/16   Nicholas Lose, MD  Cholecalciferol (RA VITAMIN D-3) 2000 units CAPS Take 1 capsule (2,000 Units total) by mouth daily. 12/21/17   Nicholas Lose, MD  divalproex (DEPAKOTE ER) 500 MG 24 hr tablet  09/28/19   [provider]  latanoprost (XALATAN) 0.005 % ophthalmic solution Place 1 drop into both eyes at bedtime.  [provider]  losartan (COZAAR) 100 MG tablet Take 100 mg by mouth daily.      [provider]  memantine (NAMENDA) 5 MG tablet Take 5 mg by mouth 2 (two) times daily.    [provider]  Multiple Vitamins-Minerals (MULTIVITAMIN WITH MINERALS) tablet Take 1 tablet by mouth daily.      [provider]  OLANZapine (ZYPREXA) 5 MG tablet Take 5 mg by mouth at bedtime. One in the am and three at bedtime.    [provider]  pioglitazone (ACTOS) 30 MG tablet Take 15 mg by mouth daily.     [provider]  RA COL-RITE 100 MG capsule Take 100 mg by mouth daily.  10/28/14   [provider]  simvastatin (ZOCOR) 10 MG tablet Take 10 mg by mouth daily.    [provider]    Allergies    Patient has no known allergies.  Review of Systems   Review of Systems  Constitutional: Negative for chills and fever.  Eyes: Negative for visual disturbance.  Respiratory: Negative for cough and shortness of breath.   Cardiovascular: Positive for leg swelling. Negative for chest pain and palpitations.  Gastrointestinal: Negative for abdominal pain.  Genitourinary: Negative for dysuria and frequency.  Musculoskeletal: Negative for back pain.  Skin: Positive for color change. Negative for rash and wound.  Neurological: Negative for dizziness, weakness and headaches.    Physical Exam Updated Vital Signs BP (!) 149/61   Pulse 76   Temp 98.5 F (36.9 C) (Oral)   Resp 16   Ht 5\' 3"  (1.6 m)   Wt 79.4 kg   SpO2 100%   BMI 31.00 kg/m   Physical Exam Constitutional:      General: She is not in acute distress.    Appearance: Normal appearance.  HENT:     Head: Normocephalic and atraumatic.  Eyes:     Extraocular Movements: Extraocular movements intact.  Cardiovascular:     Rate and Rhythm: Normal rate and regular rhythm.     Pulses: Normal pulses.  Pulmonary:     Effort: Pulmonary effort is normal.     Breath sounds: Normal breath sounds.  Abdominal:     General: Abdomen is flat. Bowel sounds are normal.     Palpations: Abdomen is soft.     Tenderness: There is no abdominal tenderness.  Musculoskeletal:        General: Normal range of motion.     Cervical back: Normal range of motion.     Comments: 2+ bilateral lower extremity pitting edema.  Skin:    General: Skin is warm and dry.     Comments: Mild erythema in bilateral lower extremity  Neurological:     General: No focal deficit present.     Mental Status: She is alert and oriented to person, place, and time.  Psychiatric:        Mood and Affect: Mood normal.     ED Results / Procedures / Treatments   Labs (all labs ordered are listed, but only abnormal results are  displayed) Labs Reviewed  COMPREHENSIVE METABOLIC PANEL - Abnormal; Notable for the following components:      Result Value   Glucose, Bld 358 (*)    BUN 40 (*)    Creatinine, Ser 1.24 (*)    GFR, Estimated 45 (*)    All other components within normal limits  TSH - Abnormal; Notable for the following components:   TSH 5.311 (*)  All other components within normal limits  URINALYSIS, ROUTINE W REFLEX MICROSCOPIC - Abnormal; Notable for the following components:   Color, Urine STRAW (*)    Glucose, UA >=500 (*)    Leukocytes,Ua MODERATE (*)    All other components within normal limits  CBC WITH DIFFERENTIAL/PLATELET  BRAIN NATRIURETIC PEPTIDE  SODIUM, URINE, RANDOM  CREATININE, URINE, RANDOM    EKG None  Radiology DG Chest 2 View  Result Date: 09/05/2020 CLINICAL DATA:  Shortness of breath EXAM: CHEST - 2 VIEW COMPARISON:  12/04/2009 FINDINGS: No focal opacity or pleural effusion. Cardiomediastinal silhouette within normal limits. Aortic atherosclerosis. Post mastectomy changes on the left with axillary clips. Degenerative changes of the spine. Apparent artifact over the anterior chest on lateral view. IMPRESSION: No active cardiopulmonary disease. Electronically Signed   By: Donavan Foil M.D.   On: 09/05/2020 16:26   VAS [place] LOWER EXTREMITY VENOUS (DVT) (ONLY [facility] & [facility])  Result Date: 09/05/2020  Lower Venous DVT Study Patient Name:  Alexandra Lamb  Date of Exam:   09/05/2020 Medical Rec #: [id number]       Accession #:    [id number] Date of Birth: October 25, 1945        Patient Gender: F Patient Age:   72Y Exam Location:  [facility] Procedure:      VAS [place] LOWER EXTREMITY VENOUS (DVT) Referring Phys: [id number] RACHEL MORGAN LITTLE --------------------------------------------------------------------------------  Indications: Edema.  Limitations: Body habitus, poor ultrasound/tissue interface and movement. Comparison Study: No previous exams Performing Technologist: Rogelia Rohrer   Examination Guidelines: A complete evaluation includes B-mode imaging, spectral Doppler, color Doppler, and power Doppler as needed of all accessible portions of each vessel. Bilateral testing is considered an integral part of a complete examination. Limited examinations for reoccurring indications may be performed as noted. The reflux portion of the exam is performed with the patient in reverse Trendelenburg.  +---------+---------------+---------+-----------+----------+------------------+ RIGHT    CompressibilityPhasicitySpontaneityPropertiesThrombus Aging     +---------+---------------+---------+-----------+----------+------------------+ CFV      Full                                                            +---------+---------------+---------+-----------+----------+------------------+ SFJ      Full                                                            +---------+---------------+---------+-----------+----------+------------------+ FV Prox  Full                                                            +---------+---------------+---------+-----------+----------+------------------+ FV Mid   Full                                                            +---------+---------------+---------+-----------+----------+------------------+  FV DistalFull                                                            +---------+---------------+---------+-----------+----------+------------------+ PFV      Full                                                            +---------+---------------+---------+-----------+----------+------------------+ POP      Full                                                            +---------+---------------+---------+-----------+----------+------------------+ PTV      Full                                         Not well                                                                 visualized          +---------+---------------+---------+-----------+----------+------------------+ PERO                                                  Not seen on this                                                         exam               +---------+---------------+---------+-----------+----------+------------------+   Right Technical Findings: Not visualized segments include Peroneal veins.  +---------+---------------+---------+-----------+----------+------------------+ LEFT     CompressibilityPhasicitySpontaneityPropertiesThrombus Aging     +---------+---------------+---------+-----------+----------+------------------+ CFV      Full           Yes      Yes                                     +---------+---------------+---------+-----------+----------+------------------+ SFJ      Full                                                            +---------+---------------+---------+-----------+----------+------------------+ FV  Prox  Full           Yes      Yes                                     +---------+---------------+---------+-----------+----------+------------------+ FV Mid   Full           Yes      Yes                                     +---------+---------------+---------+-----------+----------+------------------+ FV DistalFull           Yes      Yes                                     +---------+---------------+---------+-----------+----------+------------------+ PFV      Full                                                            +---------+---------------+---------+-----------+----------+------------------+ POP      Full                                                            +---------+---------------+---------+-----------+----------+------------------+ PTV      Full                                         Not well                                                                 visualized          +---------+---------------+---------+-----------+----------+------------------+ PERO                                                  Not seen on this                                                         exam               +---------+---------------+---------+-----------+----------+------------------+   Left Technical Findings: Not visualized segments include Peroneal veins.   Summary: BILATERAL: - No evidence of deep vein thrombosis seen in the lower extremities, bilaterally. - No evidence of superficial venous thrombosis in the lower extremities, bilaterally. -No evidence of popliteal  cyst, bilaterally. -Bilateral subcutaneous edema from area of popliteal to ankle.  *See table(s) above for measurements and observations.    Preliminary     Procedures Procedures   Medications Ordered in ED Medications  furosemide (LASIX) injection 40 mg (40 mg Intravenous Given 09/05/20 1415)    ED Course  I have reviewed the triage vital signs and the nursing notes.  Pertinent labs & imaging results that were available during my care of the patient were reviewed by me and considered in my medical decision making (see chart for details).  Clinical Course as of 09/06/20 0101  Thu Sep 05, 2020  1519 TSH(!): 5.311 [PA]  1519 Brain natriuretic peptide [PA]  1519 Glucose, UA(!): >=500 [PA]  1519 Chalmers Guest): MODERATE [PA]  1603 Glucose, UA(!): >=500 [PA]  1603 Color, Urine(!): STRAW [PA]  1603 VAS [place] LOWER EXTREMITY VENOUS (DVT) (ONLY [facility] & [facility]) [PA]  3785 DG Chest 2 View [PA]    Clinical Course User Index [PA] Lacinda Axon, MD   MDM Rules/Calculators/A&P                          75 year old woman with PMH of HLD, type 2 diabetes, HTN, diastolic heart failure, schizophrenia and breast cancer who presents for evaluation of bilateral lower extremity swelling.  On exam, patient found to have 2+ bilateral lower extremity pitting edema with some associated redness of the lower  extremities.  No JVD, crackles, leg pain or abdominal pain.  Differential for her bilateral lower extremity swelling remains broad including acute heart failure, continuous insufficiency, hypothyroidism, medication side effects or DVT. Vas U/S BLE was negative for DVT. UA significant for glucosuria and mod leuks. TSH improved from 7.06 six months ago to 5.31 today. CMP significant for glucose of 358 and AKI with Cr of 1.24 from 0.9 six months ago. Normal BNP, lack of respiratory symptoms, JVD and rales on exam in addition to normal CXR makes heart failure less likely. AKI likely due to intracellular fluid depletion in the setting of increased dose of lasix at home. Advised patient hold lasix and follow up with PCP for BMP check and re-evaluation. BLE edema likely due to venous insufficiency. Advised patient to wear compression stocking, elevate legs as much as possible while sitting or sleeping and reduce salt intake. Glucosuria due to uncontrolled diabetes. Patient advised to discuss with PCP about restarting metformin and doing dietary counseling.   Patient discharged home in stable condition with the above instructions and follow up with PCP.   Final Clinical Impression(s) / ED Diagnoses Final diagnoses:  Bilateral lower extremity edema    Rx / DC Orders ED Discharge Orders    None       Lacinda Axon, MD 09/06/20 0115    Rex Kras Wenda Overland, MD 09/12/20 1616

## 2020-09-05 NOTE — ED Triage Notes (Addendum)
Patient's legal guardian states leg swelling bilaterally and is worsening. Patient also has redness and feet are swollen today as well. Legal Guardian reports that the patient has been on Lasix 80 mg and no difference in the swelling.  Patient lives at [facility]

## 2020-09-05 NOTE — ED Notes (Signed)
Patient back from radiology at this time 

## 2020-09-05 NOTE — ED Notes (Signed)
Patient transported to X-ray 

## 2020-09-06 DIAGNOSIS — N3946 Mixed incontinence: Secondary | ICD-10-CM | POA: Diagnosis not present

## 2020-09-06 DIAGNOSIS — M6281 Muscle weakness (generalized): Secondary | ICD-10-CM | POA: Diagnosis not present

## 2020-09-06 DIAGNOSIS — R262 Difficulty in walking, not elsewhere classified: Secondary | ICD-10-CM | POA: Diagnosis not present

## 2020-09-06 DIAGNOSIS — R296 Repeated falls: Secondary | ICD-10-CM | POA: Diagnosis not present

## 2020-09-06 DIAGNOSIS — R2681 Unsteadiness on feet: Secondary | ICD-10-CM | POA: Diagnosis not present

## 2020-09-09 DIAGNOSIS — M6281 Muscle weakness (generalized): Secondary | ICD-10-CM | POA: Diagnosis not present

## 2020-09-09 DIAGNOSIS — R296 Repeated falls: Secondary | ICD-10-CM | POA: Diagnosis not present

## 2020-09-09 DIAGNOSIS — N3946 Mixed incontinence: Secondary | ICD-10-CM | POA: Diagnosis not present

## 2020-09-09 DIAGNOSIS — R2681 Unsteadiness on feet: Secondary | ICD-10-CM | POA: Diagnosis not present

## 2020-09-09 DIAGNOSIS — R262 Difficulty in walking, not elsewhere classified: Secondary | ICD-10-CM | POA: Diagnosis not present

## 2020-09-10 DIAGNOSIS — M6281 Muscle weakness (generalized): Secondary | ICD-10-CM | POA: Diagnosis not present

## 2020-09-10 DIAGNOSIS — N3946 Mixed incontinence: Secondary | ICD-10-CM | POA: Diagnosis not present

## 2020-09-10 DIAGNOSIS — R262 Difficulty in walking, not elsewhere classified: Secondary | ICD-10-CM | POA: Diagnosis not present

## 2020-09-10 DIAGNOSIS — R2681 Unsteadiness on feet: Secondary | ICD-10-CM | POA: Diagnosis not present

## 2020-09-10 DIAGNOSIS — R296 Repeated falls: Secondary | ICD-10-CM | POA: Diagnosis not present

## 2020-09-11 DIAGNOSIS — R262 Difficulty in walking, not elsewhere classified: Secondary | ICD-10-CM | POA: Diagnosis not present

## 2020-09-11 DIAGNOSIS — M6281 Muscle weakness (generalized): Secondary | ICD-10-CM | POA: Diagnosis not present

## 2020-09-11 DIAGNOSIS — N3946 Mixed incontinence: Secondary | ICD-10-CM | POA: Diagnosis not present

## 2020-09-11 DIAGNOSIS — R296 Repeated falls: Secondary | ICD-10-CM | POA: Diagnosis not present

## 2020-09-11 DIAGNOSIS — R2681 Unsteadiness on feet: Secondary | ICD-10-CM | POA: Diagnosis not present

## 2020-09-12 DIAGNOSIS — N3946 Mixed incontinence: Secondary | ICD-10-CM | POA: Diagnosis not present

## 2020-09-12 DIAGNOSIS — R2681 Unsteadiness on feet: Secondary | ICD-10-CM | POA: Diagnosis not present

## 2020-09-12 DIAGNOSIS — M6281 Muscle weakness (generalized): Secondary | ICD-10-CM | POA: Diagnosis not present

## 2020-09-12 DIAGNOSIS — R262 Difficulty in walking, not elsewhere classified: Secondary | ICD-10-CM | POA: Diagnosis not present

## 2020-09-12 DIAGNOSIS — R296 Repeated falls: Secondary | ICD-10-CM | POA: Diagnosis not present

## 2020-09-13 DIAGNOSIS — R262 Difficulty in walking, not elsewhere classified: Secondary | ICD-10-CM | POA: Diagnosis not present

## 2020-09-13 DIAGNOSIS — R296 Repeated falls: Secondary | ICD-10-CM | POA: Diagnosis not present

## 2020-09-13 DIAGNOSIS — R2681 Unsteadiness on feet: Secondary | ICD-10-CM | POA: Diagnosis not present

## 2020-09-13 DIAGNOSIS — N3946 Mixed incontinence: Secondary | ICD-10-CM | POA: Diagnosis not present

## 2020-09-13 DIAGNOSIS — M6281 Muscle weakness (generalized): Secondary | ICD-10-CM | POA: Diagnosis not present

## 2020-09-16 DIAGNOSIS — R296 Repeated falls: Secondary | ICD-10-CM | POA: Diagnosis not present

## 2020-09-16 DIAGNOSIS — M6281 Muscle weakness (generalized): Secondary | ICD-10-CM | POA: Diagnosis not present

## 2020-09-16 DIAGNOSIS — R262 Difficulty in walking, not elsewhere classified: Secondary | ICD-10-CM | POA: Diagnosis not present

## 2020-09-16 DIAGNOSIS — R2681 Unsteadiness on feet: Secondary | ICD-10-CM | POA: Diagnosis not present

## 2020-09-16 DIAGNOSIS — N3946 Mixed incontinence: Secondary | ICD-10-CM | POA: Diagnosis not present

## 2020-09-17 DIAGNOSIS — R262 Difficulty in walking, not elsewhere classified: Secondary | ICD-10-CM | POA: Diagnosis not present

## 2020-09-17 DIAGNOSIS — M6281 Muscle weakness (generalized): Secondary | ICD-10-CM | POA: Diagnosis not present

## 2020-09-17 DIAGNOSIS — R2681 Unsteadiness on feet: Secondary | ICD-10-CM | POA: Diagnosis not present

## 2020-09-17 DIAGNOSIS — N3946 Mixed incontinence: Secondary | ICD-10-CM | POA: Diagnosis not present

## 2020-09-17 DIAGNOSIS — F200 Paranoid schizophrenia: Secondary | ICD-10-CM | POA: Diagnosis not present

## 2020-09-17 DIAGNOSIS — G3184 Mild cognitive impairment, so stated: Secondary | ICD-10-CM | POA: Diagnosis not present

## 2020-09-17 DIAGNOSIS — R296 Repeated falls: Secondary | ICD-10-CM | POA: Diagnosis not present

## 2020-09-18 DIAGNOSIS — R262 Difficulty in walking, not elsewhere classified: Secondary | ICD-10-CM | POA: Diagnosis not present

## 2020-09-18 DIAGNOSIS — R2681 Unsteadiness on feet: Secondary | ICD-10-CM | POA: Diagnosis not present

## 2020-09-18 DIAGNOSIS — M6281 Muscle weakness (generalized): Secondary | ICD-10-CM | POA: Diagnosis not present

## 2020-09-18 DIAGNOSIS — R296 Repeated falls: Secondary | ICD-10-CM | POA: Diagnosis not present

## 2020-09-18 DIAGNOSIS — N3946 Mixed incontinence: Secondary | ICD-10-CM | POA: Diagnosis not present

## 2020-09-19 DIAGNOSIS — R262 Difficulty in walking, not elsewhere classified: Secondary | ICD-10-CM | POA: Diagnosis not present

## 2020-09-19 DIAGNOSIS — M6281 Muscle weakness (generalized): Secondary | ICD-10-CM | POA: Diagnosis not present

## 2020-09-19 DIAGNOSIS — R2681 Unsteadiness on feet: Secondary | ICD-10-CM | POA: Diagnosis not present

## 2020-09-19 DIAGNOSIS — N3946 Mixed incontinence: Secondary | ICD-10-CM | POA: Diagnosis not present

## 2020-09-19 DIAGNOSIS — R296 Repeated falls: Secondary | ICD-10-CM | POA: Diagnosis not present

## 2020-09-20 DIAGNOSIS — R2681 Unsteadiness on feet: Secondary | ICD-10-CM | POA: Diagnosis not present

## 2020-09-20 DIAGNOSIS — M6281 Muscle weakness (generalized): Secondary | ICD-10-CM | POA: Diagnosis not present

## 2020-09-20 DIAGNOSIS — N3946 Mixed incontinence: Secondary | ICD-10-CM | POA: Diagnosis not present

## 2020-09-20 DIAGNOSIS — R262 Difficulty in walking, not elsewhere classified: Secondary | ICD-10-CM | POA: Diagnosis not present

## 2020-09-20 DIAGNOSIS — R296 Repeated falls: Secondary | ICD-10-CM | POA: Diagnosis not present

## 2020-09-23 DIAGNOSIS — Z7984 Long term (current) use of oral hypoglycemic drugs: Secondary | ICD-10-CM | POA: Diagnosis not present

## 2020-09-23 DIAGNOSIS — R296 Repeated falls: Secondary | ICD-10-CM | POA: Diagnosis not present

## 2020-09-23 DIAGNOSIS — R262 Difficulty in walking, not elsewhere classified: Secondary | ICD-10-CM | POA: Diagnosis not present

## 2020-09-23 DIAGNOSIS — F209 Schizophrenia, unspecified: Secondary | ICD-10-CM | POA: Diagnosis not present

## 2020-09-23 DIAGNOSIS — N3946 Mixed incontinence: Secondary | ICD-10-CM | POA: Diagnosis not present

## 2020-09-23 DIAGNOSIS — R2681 Unsteadiness on feet: Secondary | ICD-10-CM | POA: Diagnosis not present

## 2020-09-23 DIAGNOSIS — E1169 Type 2 diabetes mellitus with other specified complication: Secondary | ICD-10-CM | POA: Diagnosis not present

## 2020-09-23 DIAGNOSIS — M6281 Muscle weakness (generalized): Secondary | ICD-10-CM | POA: Diagnosis not present

## 2020-09-23 DIAGNOSIS — R609 Edema, unspecified: Secondary | ICD-10-CM | POA: Diagnosis not present

## 2020-09-23 DIAGNOSIS — E785 Hyperlipidemia, unspecified: Secondary | ICD-10-CM | POA: Diagnosis not present

## 2020-09-23 DIAGNOSIS — I10 Essential (primary) hypertension: Secondary | ICD-10-CM | POA: Diagnosis not present

## 2020-09-23 DIAGNOSIS — Z23 Encounter for immunization: Secondary | ICD-10-CM | POA: Diagnosis not present

## 2020-09-24 DIAGNOSIS — N3946 Mixed incontinence: Secondary | ICD-10-CM | POA: Diagnosis not present

## 2020-09-24 DIAGNOSIS — M6281 Muscle weakness (generalized): Secondary | ICD-10-CM | POA: Diagnosis not present

## 2020-09-24 DIAGNOSIS — R296 Repeated falls: Secondary | ICD-10-CM | POA: Diagnosis not present

## 2020-09-24 DIAGNOSIS — R2681 Unsteadiness on feet: Secondary | ICD-10-CM | POA: Diagnosis not present

## 2020-09-24 DIAGNOSIS — R262 Difficulty in walking, not elsewhere classified: Secondary | ICD-10-CM | POA: Diagnosis not present

## 2020-09-25 DIAGNOSIS — N3946 Mixed incontinence: Secondary | ICD-10-CM | POA: Diagnosis not present

## 2020-09-25 DIAGNOSIS — R296 Repeated falls: Secondary | ICD-10-CM | POA: Diagnosis not present

## 2020-09-25 DIAGNOSIS — R2681 Unsteadiness on feet: Secondary | ICD-10-CM | POA: Diagnosis not present

## 2020-09-25 DIAGNOSIS — M6281 Muscle weakness (generalized): Secondary | ICD-10-CM | POA: Diagnosis not present

## 2020-09-25 DIAGNOSIS — R262 Difficulty in walking, not elsewhere classified: Secondary | ICD-10-CM | POA: Diagnosis not present

## 2020-09-26 DIAGNOSIS — M6281 Muscle weakness (generalized): Secondary | ICD-10-CM | POA: Diagnosis not present

## 2020-09-26 DIAGNOSIS — N3946 Mixed incontinence: Secondary | ICD-10-CM | POA: Diagnosis not present

## 2020-09-26 DIAGNOSIS — R2681 Unsteadiness on feet: Secondary | ICD-10-CM | POA: Diagnosis not present

## 2020-09-26 DIAGNOSIS — R262 Difficulty in walking, not elsewhere classified: Secondary | ICD-10-CM | POA: Diagnosis not present

## 2020-09-26 DIAGNOSIS — R296 Repeated falls: Secondary | ICD-10-CM | POA: Diagnosis not present

## 2020-09-27 DIAGNOSIS — N3946 Mixed incontinence: Secondary | ICD-10-CM | POA: Diagnosis not present

## 2020-09-27 DIAGNOSIS — M6281 Muscle weakness (generalized): Secondary | ICD-10-CM | POA: Diagnosis not present

## 2020-09-27 DIAGNOSIS — R262 Difficulty in walking, not elsewhere classified: Secondary | ICD-10-CM | POA: Diagnosis not present

## 2020-09-27 DIAGNOSIS — R2681 Unsteadiness on feet: Secondary | ICD-10-CM | POA: Diagnosis not present

## 2020-09-27 DIAGNOSIS — R296 Repeated falls: Secondary | ICD-10-CM | POA: Diagnosis not present

## 2020-09-30 DIAGNOSIS — R262 Difficulty in walking, not elsewhere classified: Secondary | ICD-10-CM | POA: Diagnosis not present

## 2020-09-30 DIAGNOSIS — R296 Repeated falls: Secondary | ICD-10-CM | POA: Diagnosis not present

## 2020-09-30 DIAGNOSIS — M6281 Muscle weakness (generalized): Secondary | ICD-10-CM | POA: Diagnosis not present

## 2020-09-30 DIAGNOSIS — N3946 Mixed incontinence: Secondary | ICD-10-CM | POA: Diagnosis not present

## 2020-09-30 DIAGNOSIS — R2681 Unsteadiness on feet: Secondary | ICD-10-CM | POA: Diagnosis not present

## 2020-10-01 DIAGNOSIS — R262 Difficulty in walking, not elsewhere classified: Secondary | ICD-10-CM | POA: Diagnosis not present

## 2020-10-01 DIAGNOSIS — R2681 Unsteadiness on feet: Secondary | ICD-10-CM | POA: Diagnosis not present

## 2020-10-01 DIAGNOSIS — N3946 Mixed incontinence: Secondary | ICD-10-CM | POA: Diagnosis not present

## 2020-10-01 DIAGNOSIS — M6281 Muscle weakness (generalized): Secondary | ICD-10-CM | POA: Diagnosis not present

## 2020-10-01 DIAGNOSIS — R296 Repeated falls: Secondary | ICD-10-CM | POA: Diagnosis not present

## 2020-10-03 DIAGNOSIS — R2681 Unsteadiness on feet: Secondary | ICD-10-CM | POA: Diagnosis not present

## 2020-10-03 DIAGNOSIS — N3946 Mixed incontinence: Secondary | ICD-10-CM | POA: Diagnosis not present

## 2020-10-03 DIAGNOSIS — R262 Difficulty in walking, not elsewhere classified: Secondary | ICD-10-CM | POA: Diagnosis not present

## 2020-10-03 DIAGNOSIS — R296 Repeated falls: Secondary | ICD-10-CM | POA: Diagnosis not present

## 2020-10-03 DIAGNOSIS — M6281 Muscle weakness (generalized): Secondary | ICD-10-CM | POA: Diagnosis not present

## 2020-10-04 DIAGNOSIS — M6281 Muscle weakness (generalized): Secondary | ICD-10-CM | POA: Diagnosis not present

## 2020-10-04 DIAGNOSIS — N3946 Mixed incontinence: Secondary | ICD-10-CM | POA: Diagnosis not present

## 2020-10-04 DIAGNOSIS — R2681 Unsteadiness on feet: Secondary | ICD-10-CM | POA: Diagnosis not present

## 2020-10-04 DIAGNOSIS — R262 Difficulty in walking, not elsewhere classified: Secondary | ICD-10-CM | POA: Diagnosis not present

## 2020-10-04 DIAGNOSIS — R296 Repeated falls: Secondary | ICD-10-CM | POA: Diagnosis not present

## 2020-10-07 DIAGNOSIS — N3946 Mixed incontinence: Secondary | ICD-10-CM | POA: Diagnosis not present

## 2020-10-07 DIAGNOSIS — G3184 Mild cognitive impairment, so stated: Secondary | ICD-10-CM | POA: Diagnosis not present

## 2020-10-07 DIAGNOSIS — M6281 Muscle weakness (generalized): Secondary | ICD-10-CM | POA: Diagnosis not present

## 2020-10-07 DIAGNOSIS — F200 Paranoid schizophrenia: Secondary | ICD-10-CM | POA: Diagnosis not present

## 2020-10-07 DIAGNOSIS — R262 Difficulty in walking, not elsewhere classified: Secondary | ICD-10-CM | POA: Diagnosis not present

## 2020-10-07 DIAGNOSIS — R2681 Unsteadiness on feet: Secondary | ICD-10-CM | POA: Diagnosis not present

## 2020-10-07 DIAGNOSIS — R296 Repeated falls: Secondary | ICD-10-CM | POA: Diagnosis not present

## 2020-10-08 DIAGNOSIS — R262 Difficulty in walking, not elsewhere classified: Secondary | ICD-10-CM | POA: Diagnosis not present

## 2020-10-08 DIAGNOSIS — M6281 Muscle weakness (generalized): Secondary | ICD-10-CM | POA: Diagnosis not present

## 2020-10-08 DIAGNOSIS — R296 Repeated falls: Secondary | ICD-10-CM | POA: Diagnosis not present

## 2020-10-08 DIAGNOSIS — N3946 Mixed incontinence: Secondary | ICD-10-CM | POA: Diagnosis not present

## 2020-10-08 DIAGNOSIS — R2681 Unsteadiness on feet: Secondary | ICD-10-CM | POA: Diagnosis not present

## 2020-10-09 DIAGNOSIS — H2511 Age-related nuclear cataract, right eye: Secondary | ICD-10-CM | POA: Diagnosis not present

## 2020-10-09 DIAGNOSIS — H25811 Combined forms of age-related cataract, right eye: Secondary | ICD-10-CM | POA: Diagnosis not present

## 2020-10-10 DIAGNOSIS — R2681 Unsteadiness on feet: Secondary | ICD-10-CM | POA: Diagnosis not present

## 2020-10-10 DIAGNOSIS — N3946 Mixed incontinence: Secondary | ICD-10-CM | POA: Diagnosis not present

## 2020-10-10 DIAGNOSIS — R296 Repeated falls: Secondary | ICD-10-CM | POA: Diagnosis not present

## 2020-10-10 DIAGNOSIS — R262 Difficulty in walking, not elsewhere classified: Secondary | ICD-10-CM | POA: Diagnosis not present

## 2020-10-10 DIAGNOSIS — M6281 Muscle weakness (generalized): Secondary | ICD-10-CM | POA: Diagnosis not present

## 2020-10-11 DIAGNOSIS — R2681 Unsteadiness on feet: Secondary | ICD-10-CM | POA: Diagnosis not present

## 2020-10-11 DIAGNOSIS — N3946 Mixed incontinence: Secondary | ICD-10-CM | POA: Diagnosis not present

## 2020-10-11 DIAGNOSIS — R262 Difficulty in walking, not elsewhere classified: Secondary | ICD-10-CM | POA: Diagnosis not present

## 2020-10-11 DIAGNOSIS — R296 Repeated falls: Secondary | ICD-10-CM | POA: Diagnosis not present

## 2020-10-11 DIAGNOSIS — M6281 Muscle weakness (generalized): Secondary | ICD-10-CM | POA: Diagnosis not present

## 2020-10-14 DIAGNOSIS — M6281 Muscle weakness (generalized): Secondary | ICD-10-CM | POA: Diagnosis not present

## 2020-10-14 DIAGNOSIS — R262 Difficulty in walking, not elsewhere classified: Secondary | ICD-10-CM | POA: Diagnosis not present

## 2020-10-14 DIAGNOSIS — N3946 Mixed incontinence: Secondary | ICD-10-CM | POA: Diagnosis not present

## 2020-10-14 DIAGNOSIS — R296 Repeated falls: Secondary | ICD-10-CM | POA: Diagnosis not present

## 2020-10-14 DIAGNOSIS — R2681 Unsteadiness on feet: Secondary | ICD-10-CM | POA: Diagnosis not present

## 2020-10-15 DIAGNOSIS — R2681 Unsteadiness on feet: Secondary | ICD-10-CM | POA: Diagnosis not present

## 2020-10-15 DIAGNOSIS — M6281 Muscle weakness (generalized): Secondary | ICD-10-CM | POA: Diagnosis not present

## 2020-10-15 DIAGNOSIS — N3946 Mixed incontinence: Secondary | ICD-10-CM | POA: Diagnosis not present

## 2020-10-15 DIAGNOSIS — R262 Difficulty in walking, not elsewhere classified: Secondary | ICD-10-CM | POA: Diagnosis not present

## 2020-10-15 DIAGNOSIS — R296 Repeated falls: Secondary | ICD-10-CM | POA: Diagnosis not present

## 2020-10-16 DIAGNOSIS — R296 Repeated falls: Secondary | ICD-10-CM | POA: Diagnosis not present

## 2020-10-16 DIAGNOSIS — M6281 Muscle weakness (generalized): Secondary | ICD-10-CM | POA: Diagnosis not present

## 2020-10-16 DIAGNOSIS — R2681 Unsteadiness on feet: Secondary | ICD-10-CM | POA: Diagnosis not present

## 2020-10-16 DIAGNOSIS — R262 Difficulty in walking, not elsewhere classified: Secondary | ICD-10-CM | POA: Diagnosis not present

## 2020-10-16 DIAGNOSIS — N3946 Mixed incontinence: Secondary | ICD-10-CM | POA: Diagnosis not present

## 2020-10-18 DIAGNOSIS — M6281 Muscle weakness (generalized): Secondary | ICD-10-CM | POA: Diagnosis not present

## 2020-10-18 DIAGNOSIS — R262 Difficulty in walking, not elsewhere classified: Secondary | ICD-10-CM | POA: Diagnosis not present

## 2020-10-18 DIAGNOSIS — N3946 Mixed incontinence: Secondary | ICD-10-CM | POA: Diagnosis not present

## 2020-10-18 DIAGNOSIS — R296 Repeated falls: Secondary | ICD-10-CM | POA: Diagnosis not present

## 2020-10-18 DIAGNOSIS — R2681 Unsteadiness on feet: Secondary | ICD-10-CM | POA: Diagnosis not present

## 2020-10-22 DIAGNOSIS — M6281 Muscle weakness (generalized): Secondary | ICD-10-CM | POA: Diagnosis not present

## 2020-10-22 DIAGNOSIS — R296 Repeated falls: Secondary | ICD-10-CM | POA: Diagnosis not present

## 2020-10-22 DIAGNOSIS — N3946 Mixed incontinence: Secondary | ICD-10-CM | POA: Diagnosis not present

## 2020-10-22 DIAGNOSIS — R2681 Unsteadiness on feet: Secondary | ICD-10-CM | POA: Diagnosis not present

## 2020-10-22 DIAGNOSIS — R262 Difficulty in walking, not elsewhere classified: Secondary | ICD-10-CM | POA: Diagnosis not present

## 2020-10-23 DIAGNOSIS — R296 Repeated falls: Secondary | ICD-10-CM | POA: Diagnosis not present

## 2020-10-23 DIAGNOSIS — R262 Difficulty in walking, not elsewhere classified: Secondary | ICD-10-CM | POA: Diagnosis not present

## 2020-10-23 DIAGNOSIS — N3946 Mixed incontinence: Secondary | ICD-10-CM | POA: Diagnosis not present

## 2020-10-23 DIAGNOSIS — M6281 Muscle weakness (generalized): Secondary | ICD-10-CM | POA: Diagnosis not present

## 2020-10-23 DIAGNOSIS — R2681 Unsteadiness on feet: Secondary | ICD-10-CM | POA: Diagnosis not present

## 2020-10-24 DIAGNOSIS — N3946 Mixed incontinence: Secondary | ICD-10-CM | POA: Diagnosis not present

## 2020-10-24 DIAGNOSIS — I10 Essential (primary) hypertension: Secondary | ICD-10-CM | POA: Diagnosis not present

## 2020-10-24 DIAGNOSIS — R262 Difficulty in walking, not elsewhere classified: Secondary | ICD-10-CM | POA: Diagnosis not present

## 2020-10-24 DIAGNOSIS — Z7984 Long term (current) use of oral hypoglycemic drugs: Secondary | ICD-10-CM | POA: Diagnosis not present

## 2020-10-24 DIAGNOSIS — R296 Repeated falls: Secondary | ICD-10-CM | POA: Diagnosis not present

## 2020-10-24 DIAGNOSIS — R609 Edema, unspecified: Secondary | ICD-10-CM | POA: Diagnosis not present

## 2020-10-24 DIAGNOSIS — E785 Hyperlipidemia, unspecified: Secondary | ICD-10-CM | POA: Diagnosis not present

## 2020-10-24 DIAGNOSIS — M6281 Muscle weakness (generalized): Secondary | ICD-10-CM | POA: Diagnosis not present

## 2020-10-24 DIAGNOSIS — R2681 Unsteadiness on feet: Secondary | ICD-10-CM | POA: Diagnosis not present

## 2020-10-24 DIAGNOSIS — E1169 Type 2 diabetes mellitus with other specified complication: Secondary | ICD-10-CM | POA: Diagnosis not present

## 2020-10-25 DIAGNOSIS — R2681 Unsteadiness on feet: Secondary | ICD-10-CM | POA: Diagnosis not present

## 2020-10-25 DIAGNOSIS — N3946 Mixed incontinence: Secondary | ICD-10-CM | POA: Diagnosis not present

## 2020-10-25 DIAGNOSIS — M6281 Muscle weakness (generalized): Secondary | ICD-10-CM | POA: Diagnosis not present

## 2020-10-25 DIAGNOSIS — R262 Difficulty in walking, not elsewhere classified: Secondary | ICD-10-CM | POA: Diagnosis not present

## 2020-10-25 DIAGNOSIS — R296 Repeated falls: Secondary | ICD-10-CM | POA: Diagnosis not present

## 2020-10-28 ENCOUNTER — Ambulatory Visit: Payer: Medicare Other | Admitting: Cardiology

## 2020-10-28 DIAGNOSIS — N3946 Mixed incontinence: Secondary | ICD-10-CM | POA: Diagnosis not present

## 2020-10-28 DIAGNOSIS — M6281 Muscle weakness (generalized): Secondary | ICD-10-CM | POA: Diagnosis not present

## 2020-10-28 DIAGNOSIS — R296 Repeated falls: Secondary | ICD-10-CM | POA: Diagnosis not present

## 2020-10-28 DIAGNOSIS — R2681 Unsteadiness on feet: Secondary | ICD-10-CM | POA: Diagnosis not present

## 2020-10-28 DIAGNOSIS — R262 Difficulty in walking, not elsewhere classified: Secondary | ICD-10-CM | POA: Diagnosis not present

## 2020-10-30 DIAGNOSIS — R296 Repeated falls: Secondary | ICD-10-CM | POA: Diagnosis not present

## 2020-10-30 DIAGNOSIS — R262 Difficulty in walking, not elsewhere classified: Secondary | ICD-10-CM | POA: Diagnosis not present

## 2020-10-30 DIAGNOSIS — R2681 Unsteadiness on feet: Secondary | ICD-10-CM | POA: Diagnosis not present

## 2020-10-30 DIAGNOSIS — M6281 Muscle weakness (generalized): Secondary | ICD-10-CM | POA: Diagnosis not present

## 2020-10-30 DIAGNOSIS — N3946 Mixed incontinence: Secondary | ICD-10-CM | POA: Diagnosis not present

## 2020-10-31 DIAGNOSIS — M6281 Muscle weakness (generalized): Secondary | ICD-10-CM | POA: Diagnosis not present

## 2020-10-31 DIAGNOSIS — N3946 Mixed incontinence: Secondary | ICD-10-CM | POA: Diagnosis not present

## 2020-10-31 DIAGNOSIS — R296 Repeated falls: Secondary | ICD-10-CM | POA: Diagnosis not present

## 2020-10-31 DIAGNOSIS — R262 Difficulty in walking, not elsewhere classified: Secondary | ICD-10-CM | POA: Diagnosis not present

## 2020-10-31 DIAGNOSIS — R2681 Unsteadiness on feet: Secondary | ICD-10-CM | POA: Diagnosis not present

## 2020-11-05 DIAGNOSIS — R296 Repeated falls: Secondary | ICD-10-CM | POA: Diagnosis not present

## 2020-11-05 DIAGNOSIS — M6281 Muscle weakness (generalized): Secondary | ICD-10-CM | POA: Diagnosis not present

## 2020-11-05 DIAGNOSIS — N3946 Mixed incontinence: Secondary | ICD-10-CM | POA: Diagnosis not present

## 2020-11-05 DIAGNOSIS — R262 Difficulty in walking, not elsewhere classified: Secondary | ICD-10-CM | POA: Diagnosis not present

## 2020-11-05 DIAGNOSIS — R2681 Unsteadiness on feet: Secondary | ICD-10-CM | POA: Diagnosis not present

## 2020-11-06 DIAGNOSIS — R296 Repeated falls: Secondary | ICD-10-CM | POA: Diagnosis not present

## 2020-11-06 DIAGNOSIS — M6281 Muscle weakness (generalized): Secondary | ICD-10-CM | POA: Diagnosis not present

## 2020-11-06 DIAGNOSIS — N3946 Mixed incontinence: Secondary | ICD-10-CM | POA: Diagnosis not present

## 2020-11-06 DIAGNOSIS — R262 Difficulty in walking, not elsewhere classified: Secondary | ICD-10-CM | POA: Diagnosis not present

## 2020-11-06 DIAGNOSIS — R2681 Unsteadiness on feet: Secondary | ICD-10-CM | POA: Diagnosis not present

## 2020-11-07 DIAGNOSIS — R2681 Unsteadiness on feet: Secondary | ICD-10-CM | POA: Diagnosis not present

## 2020-11-07 DIAGNOSIS — M6281 Muscle weakness (generalized): Secondary | ICD-10-CM | POA: Diagnosis not present

## 2020-11-07 DIAGNOSIS — R262 Difficulty in walking, not elsewhere classified: Secondary | ICD-10-CM | POA: Diagnosis not present

## 2020-11-07 DIAGNOSIS — N3946 Mixed incontinence: Secondary | ICD-10-CM | POA: Diagnosis not present

## 2020-11-07 DIAGNOSIS — R296 Repeated falls: Secondary | ICD-10-CM | POA: Diagnosis not present

## 2020-11-11 DIAGNOSIS — R296 Repeated falls: Secondary | ICD-10-CM | POA: Diagnosis not present

## 2020-11-11 DIAGNOSIS — M6281 Muscle weakness (generalized): Secondary | ICD-10-CM | POA: Diagnosis not present

## 2020-11-11 DIAGNOSIS — R262 Difficulty in walking, not elsewhere classified: Secondary | ICD-10-CM | POA: Diagnosis not present

## 2020-11-11 DIAGNOSIS — N3946 Mixed incontinence: Secondary | ICD-10-CM | POA: Diagnosis not present

## 2020-11-11 DIAGNOSIS — R2681 Unsteadiness on feet: Secondary | ICD-10-CM | POA: Diagnosis not present

## 2020-11-13 DIAGNOSIS — R2681 Unsteadiness on feet: Secondary | ICD-10-CM | POA: Diagnosis not present

## 2020-11-13 DIAGNOSIS — R262 Difficulty in walking, not elsewhere classified: Secondary | ICD-10-CM | POA: Diagnosis not present

## 2020-11-13 DIAGNOSIS — M6281 Muscle weakness (generalized): Secondary | ICD-10-CM | POA: Diagnosis not present

## 2020-11-13 DIAGNOSIS — R296 Repeated falls: Secondary | ICD-10-CM | POA: Diagnosis not present

## 2020-11-13 DIAGNOSIS — N3946 Mixed incontinence: Secondary | ICD-10-CM | POA: Diagnosis not present

## 2020-12-02 ENCOUNTER — Ambulatory Visit: Payer: Medicare Other | Admitting: Internal Medicine

## 2020-12-03 ENCOUNTER — Ambulatory Visit: Payer: Medicare Other | Admitting: Cardiology

## 2020-12-10 ENCOUNTER — Ambulatory Visit: Payer: Medicare Other | Admitting: Cardiovascular Disease

## 2020-12-10 DIAGNOSIS — R489 Unspecified symbolic dysfunctions: Secondary | ICD-10-CM | POA: Diagnosis not present

## 2020-12-10 DIAGNOSIS — R471 Dysarthria and anarthria: Secondary | ICD-10-CM | POA: Diagnosis not present

## 2020-12-10 DIAGNOSIS — R41841 Cognitive communication deficit: Secondary | ICD-10-CM | POA: Diagnosis not present

## 2020-12-12 DIAGNOSIS — R471 Dysarthria and anarthria: Secondary | ICD-10-CM | POA: Diagnosis not present

## 2020-12-12 DIAGNOSIS — R41841 Cognitive communication deficit: Secondary | ICD-10-CM | POA: Diagnosis not present

## 2020-12-12 DIAGNOSIS — R489 Unspecified symbolic dysfunctions: Secondary | ICD-10-CM | POA: Diagnosis not present

## 2020-12-13 DIAGNOSIS — R41841 Cognitive communication deficit: Secondary | ICD-10-CM | POA: Diagnosis not present

## 2020-12-13 DIAGNOSIS — R489 Unspecified symbolic dysfunctions: Secondary | ICD-10-CM | POA: Diagnosis not present

## 2020-12-13 DIAGNOSIS — R471 Dysarthria and anarthria: Secondary | ICD-10-CM | POA: Diagnosis not present

## 2020-12-16 DIAGNOSIS — E039 Hypothyroidism, unspecified: Secondary | ICD-10-CM | POA: Diagnosis not present

## 2020-12-16 DIAGNOSIS — Z853 Personal history of malignant neoplasm of breast: Secondary | ICD-10-CM | POA: Diagnosis not present

## 2020-12-16 DIAGNOSIS — R471 Dysarthria and anarthria: Secondary | ICD-10-CM | POA: Diagnosis not present

## 2020-12-16 DIAGNOSIS — R41841 Cognitive communication deficit: Secondary | ICD-10-CM | POA: Diagnosis not present

## 2020-12-16 DIAGNOSIS — E785 Hyperlipidemia, unspecified: Secondary | ICD-10-CM | POA: Diagnosis not present

## 2020-12-16 DIAGNOSIS — I10 Essential (primary) hypertension: Secondary | ICD-10-CM | POA: Diagnosis not present

## 2020-12-16 DIAGNOSIS — E1169 Type 2 diabetes mellitus with other specified complication: Secondary | ICD-10-CM | POA: Diagnosis not present

## 2020-12-16 DIAGNOSIS — R489 Unspecified symbolic dysfunctions: Secondary | ICD-10-CM | POA: Diagnosis not present

## 2020-12-17 DIAGNOSIS — R471 Dysarthria and anarthria: Secondary | ICD-10-CM | POA: Diagnosis not present

## 2020-12-17 DIAGNOSIS — R41841 Cognitive communication deficit: Secondary | ICD-10-CM | POA: Diagnosis not present

## 2020-12-17 DIAGNOSIS — R489 Unspecified symbolic dysfunctions: Secondary | ICD-10-CM | POA: Diagnosis not present

## 2020-12-18 DIAGNOSIS — G3184 Mild cognitive impairment, so stated: Secondary | ICD-10-CM | POA: Diagnosis not present

## 2020-12-18 DIAGNOSIS — F200 Paranoid schizophrenia: Secondary | ICD-10-CM | POA: Diagnosis not present

## 2020-12-19 DIAGNOSIS — R489 Unspecified symbolic dysfunctions: Secondary | ICD-10-CM | POA: Diagnosis not present

## 2020-12-19 DIAGNOSIS — R471 Dysarthria and anarthria: Secondary | ICD-10-CM | POA: Diagnosis not present

## 2020-12-19 DIAGNOSIS — R41841 Cognitive communication deficit: Secondary | ICD-10-CM | POA: Diagnosis not present

## 2020-12-20 DIAGNOSIS — R471 Dysarthria and anarthria: Secondary | ICD-10-CM | POA: Diagnosis not present

## 2020-12-20 DIAGNOSIS — R489 Unspecified symbolic dysfunctions: Secondary | ICD-10-CM | POA: Diagnosis not present

## 2020-12-20 DIAGNOSIS — R41841 Cognitive communication deficit: Secondary | ICD-10-CM | POA: Diagnosis not present

## 2020-12-23 DIAGNOSIS — R489 Unspecified symbolic dysfunctions: Secondary | ICD-10-CM | POA: Diagnosis not present

## 2020-12-23 DIAGNOSIS — R471 Dysarthria and anarthria: Secondary | ICD-10-CM | POA: Diagnosis not present

## 2020-12-23 DIAGNOSIS — R41841 Cognitive communication deficit: Secondary | ICD-10-CM | POA: Diagnosis not present

## 2020-12-24 DIAGNOSIS — R471 Dysarthria and anarthria: Secondary | ICD-10-CM | POA: Diagnosis not present

## 2020-12-24 DIAGNOSIS — R489 Unspecified symbolic dysfunctions: Secondary | ICD-10-CM | POA: Diagnosis not present

## 2020-12-24 DIAGNOSIS — R41841 Cognitive communication deficit: Secondary | ICD-10-CM | POA: Diagnosis not present

## 2020-12-25 DIAGNOSIS — R41841 Cognitive communication deficit: Secondary | ICD-10-CM | POA: Diagnosis not present

## 2020-12-25 DIAGNOSIS — R471 Dysarthria and anarthria: Secondary | ICD-10-CM | POA: Diagnosis not present

## 2020-12-25 DIAGNOSIS — R489 Unspecified symbolic dysfunctions: Secondary | ICD-10-CM | POA: Diagnosis not present

## 2020-12-26 DIAGNOSIS — R41841 Cognitive communication deficit: Secondary | ICD-10-CM | POA: Diagnosis not present

## 2020-12-26 DIAGNOSIS — R471 Dysarthria and anarthria: Secondary | ICD-10-CM | POA: Diagnosis not present

## 2020-12-26 DIAGNOSIS — R489 Unspecified symbolic dysfunctions: Secondary | ICD-10-CM | POA: Diagnosis not present

## 2020-12-30 DIAGNOSIS — R471 Dysarthria and anarthria: Secondary | ICD-10-CM | POA: Diagnosis not present

## 2020-12-30 DIAGNOSIS — R489 Unspecified symbolic dysfunctions: Secondary | ICD-10-CM | POA: Diagnosis not present

## 2020-12-30 DIAGNOSIS — R41841 Cognitive communication deficit: Secondary | ICD-10-CM | POA: Diagnosis not present

## 2020-12-31 DIAGNOSIS — R41841 Cognitive communication deficit: Secondary | ICD-10-CM | POA: Diagnosis not present

## 2020-12-31 DIAGNOSIS — R489 Unspecified symbolic dysfunctions: Secondary | ICD-10-CM | POA: Diagnosis not present

## 2020-12-31 DIAGNOSIS — R471 Dysarthria and anarthria: Secondary | ICD-10-CM | POA: Diagnosis not present

## 2021-01-01 DIAGNOSIS — I10 Essential (primary) hypertension: Secondary | ICD-10-CM | POA: Diagnosis not present

## 2021-01-01 DIAGNOSIS — Z853 Personal history of malignant neoplasm of breast: Secondary | ICD-10-CM | POA: Diagnosis not present

## 2021-01-01 DIAGNOSIS — R41841 Cognitive communication deficit: Secondary | ICD-10-CM | POA: Diagnosis not present

## 2021-01-01 DIAGNOSIS — Z23 Encounter for immunization: Secondary | ICD-10-CM | POA: Diagnosis not present

## 2021-01-01 DIAGNOSIS — E039 Hypothyroidism, unspecified: Secondary | ICD-10-CM | POA: Diagnosis not present

## 2021-01-01 DIAGNOSIS — E785 Hyperlipidemia, unspecified: Secondary | ICD-10-CM | POA: Diagnosis not present

## 2021-01-01 DIAGNOSIS — Z170 Estrogen receptor positive status [ER+]: Secondary | ICD-10-CM | POA: Diagnosis not present

## 2021-01-01 DIAGNOSIS — R489 Unspecified symbolic dysfunctions: Secondary | ICD-10-CM | POA: Diagnosis not present

## 2021-01-01 DIAGNOSIS — Z7984 Long term (current) use of oral hypoglycemic drugs: Secondary | ICD-10-CM | POA: Diagnosis not present

## 2021-01-01 DIAGNOSIS — E1169 Type 2 diabetes mellitus with other specified complication: Secondary | ICD-10-CM | POA: Diagnosis not present

## 2021-01-01 DIAGNOSIS — Z0000 Encounter for general adult medical examination without abnormal findings: Secondary | ICD-10-CM | POA: Diagnosis not present

## 2021-01-01 DIAGNOSIS — R471 Dysarthria and anarthria: Secondary | ICD-10-CM | POA: Diagnosis not present

## 2021-01-01 DIAGNOSIS — F209 Schizophrenia, unspecified: Secondary | ICD-10-CM | POA: Diagnosis not present

## 2021-01-02 DIAGNOSIS — R471 Dysarthria and anarthria: Secondary | ICD-10-CM | POA: Diagnosis not present

## 2021-01-02 DIAGNOSIS — R41841 Cognitive communication deficit: Secondary | ICD-10-CM | POA: Diagnosis not present

## 2021-01-02 DIAGNOSIS — R489 Unspecified symbolic dysfunctions: Secondary | ICD-10-CM | POA: Diagnosis not present

## 2021-01-03 DIAGNOSIS — R471 Dysarthria and anarthria: Secondary | ICD-10-CM | POA: Diagnosis not present

## 2021-01-03 DIAGNOSIS — R489 Unspecified symbolic dysfunctions: Secondary | ICD-10-CM | POA: Diagnosis not present

## 2021-01-03 DIAGNOSIS — R41841 Cognitive communication deficit: Secondary | ICD-10-CM | POA: Diagnosis not present

## 2021-01-06 DIAGNOSIS — R489 Unspecified symbolic dysfunctions: Secondary | ICD-10-CM | POA: Diagnosis not present

## 2021-01-06 DIAGNOSIS — R471 Dysarthria and anarthria: Secondary | ICD-10-CM | POA: Diagnosis not present

## 2021-01-06 DIAGNOSIS — R41841 Cognitive communication deficit: Secondary | ICD-10-CM | POA: Diagnosis not present

## 2021-01-07 DIAGNOSIS — R41841 Cognitive communication deficit: Secondary | ICD-10-CM | POA: Diagnosis not present

## 2021-01-07 DIAGNOSIS — R471 Dysarthria and anarthria: Secondary | ICD-10-CM | POA: Diagnosis not present

## 2021-01-07 DIAGNOSIS — R489 Unspecified symbolic dysfunctions: Secondary | ICD-10-CM | POA: Diagnosis not present

## 2021-01-08 DIAGNOSIS — R41841 Cognitive communication deficit: Secondary | ICD-10-CM | POA: Diagnosis not present

## 2021-01-08 DIAGNOSIS — R489 Unspecified symbolic dysfunctions: Secondary | ICD-10-CM | POA: Diagnosis not present

## 2021-01-08 DIAGNOSIS — R471 Dysarthria and anarthria: Secondary | ICD-10-CM | POA: Diagnosis not present

## 2021-01-09 DIAGNOSIS — R41841 Cognitive communication deficit: Secondary | ICD-10-CM | POA: Diagnosis not present

## 2021-01-09 DIAGNOSIS — R471 Dysarthria and anarthria: Secondary | ICD-10-CM | POA: Diagnosis not present

## 2021-01-09 DIAGNOSIS — R489 Unspecified symbolic dysfunctions: Secondary | ICD-10-CM | POA: Diagnosis not present

## 2021-01-10 DIAGNOSIS — R489 Unspecified symbolic dysfunctions: Secondary | ICD-10-CM | POA: Diagnosis not present

## 2021-01-10 DIAGNOSIS — R471 Dysarthria and anarthria: Secondary | ICD-10-CM | POA: Diagnosis not present

## 2021-01-10 DIAGNOSIS — R41841 Cognitive communication deficit: Secondary | ICD-10-CM | POA: Diagnosis not present

## 2021-01-14 DIAGNOSIS — R41841 Cognitive communication deficit: Secondary | ICD-10-CM | POA: Diagnosis not present

## 2021-01-14 DIAGNOSIS — E785 Hyperlipidemia, unspecified: Secondary | ICD-10-CM | POA: Diagnosis not present

## 2021-01-14 DIAGNOSIS — E039 Hypothyroidism, unspecified: Secondary | ICD-10-CM | POA: Diagnosis not present

## 2021-01-14 DIAGNOSIS — R489 Unspecified symbolic dysfunctions: Secondary | ICD-10-CM | POA: Diagnosis not present

## 2021-01-14 DIAGNOSIS — I10 Essential (primary) hypertension: Secondary | ICD-10-CM | POA: Diagnosis not present

## 2021-01-14 DIAGNOSIS — E1169 Type 2 diabetes mellitus with other specified complication: Secondary | ICD-10-CM | POA: Diagnosis not present

## 2021-01-14 DIAGNOSIS — R471 Dysarthria and anarthria: Secondary | ICD-10-CM | POA: Diagnosis not present

## 2021-01-15 DIAGNOSIS — Z23 Encounter for immunization: Secondary | ICD-10-CM | POA: Diagnosis not present

## 2021-01-15 DIAGNOSIS — R41841 Cognitive communication deficit: Secondary | ICD-10-CM | POA: Diagnosis not present

## 2021-01-15 DIAGNOSIS — R471 Dysarthria and anarthria: Secondary | ICD-10-CM | POA: Diagnosis not present

## 2021-01-15 DIAGNOSIS — R489 Unspecified symbolic dysfunctions: Secondary | ICD-10-CM | POA: Diagnosis not present

## 2021-01-16 DIAGNOSIS — R471 Dysarthria and anarthria: Secondary | ICD-10-CM | POA: Diagnosis not present

## 2021-01-16 DIAGNOSIS — R489 Unspecified symbolic dysfunctions: Secondary | ICD-10-CM | POA: Diagnosis not present

## 2021-01-16 DIAGNOSIS — R41841 Cognitive communication deficit: Secondary | ICD-10-CM | POA: Diagnosis not present

## 2021-01-17 DIAGNOSIS — R471 Dysarthria and anarthria: Secondary | ICD-10-CM | POA: Diagnosis not present

## 2021-01-17 DIAGNOSIS — R41841 Cognitive communication deficit: Secondary | ICD-10-CM | POA: Diagnosis not present

## 2021-01-17 DIAGNOSIS — R489 Unspecified symbolic dysfunctions: Secondary | ICD-10-CM | POA: Diagnosis not present

## 2021-01-20 DIAGNOSIS — R471 Dysarthria and anarthria: Secondary | ICD-10-CM | POA: Diagnosis not present

## 2021-01-20 DIAGNOSIS — R489 Unspecified symbolic dysfunctions: Secondary | ICD-10-CM | POA: Diagnosis not present

## 2021-01-20 DIAGNOSIS — R41841 Cognitive communication deficit: Secondary | ICD-10-CM | POA: Diagnosis not present

## 2021-01-21 DIAGNOSIS — R471 Dysarthria and anarthria: Secondary | ICD-10-CM | POA: Diagnosis not present

## 2021-01-21 DIAGNOSIS — R41841 Cognitive communication deficit: Secondary | ICD-10-CM | POA: Diagnosis not present

## 2021-01-21 DIAGNOSIS — R489 Unspecified symbolic dysfunctions: Secondary | ICD-10-CM | POA: Diagnosis not present

## 2021-01-23 DIAGNOSIS — E785 Hyperlipidemia, unspecified: Secondary | ICD-10-CM | POA: Diagnosis not present

## 2021-01-23 DIAGNOSIS — R41841 Cognitive communication deficit: Secondary | ICD-10-CM | POA: Diagnosis not present

## 2021-01-23 DIAGNOSIS — R489 Unspecified symbolic dysfunctions: Secondary | ICD-10-CM | POA: Diagnosis not present

## 2021-01-23 DIAGNOSIS — R471 Dysarthria and anarthria: Secondary | ICD-10-CM | POA: Diagnosis not present

## 2021-01-23 DIAGNOSIS — I10 Essential (primary) hypertension: Secondary | ICD-10-CM | POA: Diagnosis not present

## 2021-01-23 DIAGNOSIS — E1169 Type 2 diabetes mellitus with other specified complication: Secondary | ICD-10-CM | POA: Diagnosis not present

## 2021-01-23 DIAGNOSIS — E039 Hypothyroidism, unspecified: Secondary | ICD-10-CM | POA: Diagnosis not present

## 2021-01-24 DIAGNOSIS — R41841 Cognitive communication deficit: Secondary | ICD-10-CM | POA: Diagnosis not present

## 2021-01-24 DIAGNOSIS — R489 Unspecified symbolic dysfunctions: Secondary | ICD-10-CM | POA: Diagnosis not present

## 2021-01-24 DIAGNOSIS — R471 Dysarthria and anarthria: Secondary | ICD-10-CM | POA: Diagnosis not present

## 2021-01-27 DIAGNOSIS — R489 Unspecified symbolic dysfunctions: Secondary | ICD-10-CM | POA: Diagnosis not present

## 2021-01-27 DIAGNOSIS — R471 Dysarthria and anarthria: Secondary | ICD-10-CM | POA: Diagnosis not present

## 2021-01-27 DIAGNOSIS — R41841 Cognitive communication deficit: Secondary | ICD-10-CM | POA: Diagnosis not present

## 2021-01-28 DIAGNOSIS — R471 Dysarthria and anarthria: Secondary | ICD-10-CM | POA: Diagnosis not present

## 2021-01-28 DIAGNOSIS — R41841 Cognitive communication deficit: Secondary | ICD-10-CM | POA: Diagnosis not present

## 2021-01-28 DIAGNOSIS — R489 Unspecified symbolic dysfunctions: Secondary | ICD-10-CM | POA: Diagnosis not present

## 2021-01-29 DIAGNOSIS — R471 Dysarthria and anarthria: Secondary | ICD-10-CM | POA: Diagnosis not present

## 2021-01-29 DIAGNOSIS — R41841 Cognitive communication deficit: Secondary | ICD-10-CM | POA: Diagnosis not present

## 2021-01-29 DIAGNOSIS — R489 Unspecified symbolic dysfunctions: Secondary | ICD-10-CM | POA: Diagnosis not present

## 2021-01-31 DIAGNOSIS — R471 Dysarthria and anarthria: Secondary | ICD-10-CM | POA: Diagnosis not present

## 2021-01-31 DIAGNOSIS — R489 Unspecified symbolic dysfunctions: Secondary | ICD-10-CM | POA: Diagnosis not present

## 2021-01-31 DIAGNOSIS — R41841 Cognitive communication deficit: Secondary | ICD-10-CM | POA: Diagnosis not present

## 2021-02-03 DIAGNOSIS — R41841 Cognitive communication deficit: Secondary | ICD-10-CM | POA: Diagnosis not present

## 2021-02-03 DIAGNOSIS — R471 Dysarthria and anarthria: Secondary | ICD-10-CM | POA: Diagnosis not present

## 2021-02-03 DIAGNOSIS — R489 Unspecified symbolic dysfunctions: Secondary | ICD-10-CM | POA: Diagnosis not present

## 2021-02-05 DIAGNOSIS — R489 Unspecified symbolic dysfunctions: Secondary | ICD-10-CM | POA: Diagnosis not present

## 2021-02-05 DIAGNOSIS — R471 Dysarthria and anarthria: Secondary | ICD-10-CM | POA: Diagnosis not present

## 2021-02-05 DIAGNOSIS — R41841 Cognitive communication deficit: Secondary | ICD-10-CM | POA: Diagnosis not present

## 2021-02-07 DIAGNOSIS — R471 Dysarthria and anarthria: Secondary | ICD-10-CM | POA: Diagnosis not present

## 2021-02-07 DIAGNOSIS — R41841 Cognitive communication deficit: Secondary | ICD-10-CM | POA: Diagnosis not present

## 2021-02-07 DIAGNOSIS — R489 Unspecified symbolic dysfunctions: Secondary | ICD-10-CM | POA: Diagnosis not present

## 2021-02-10 DIAGNOSIS — R41841 Cognitive communication deficit: Secondary | ICD-10-CM | POA: Diagnosis not present

## 2021-02-10 DIAGNOSIS — R489 Unspecified symbolic dysfunctions: Secondary | ICD-10-CM | POA: Diagnosis not present

## 2021-02-10 DIAGNOSIS — R471 Dysarthria and anarthria: Secondary | ICD-10-CM | POA: Diagnosis not present

## 2021-02-13 DIAGNOSIS — R471 Dysarthria and anarthria: Secondary | ICD-10-CM | POA: Diagnosis not present

## 2021-02-13 DIAGNOSIS — R489 Unspecified symbolic dysfunctions: Secondary | ICD-10-CM | POA: Diagnosis not present

## 2021-02-13 DIAGNOSIS — R41841 Cognitive communication deficit: Secondary | ICD-10-CM | POA: Diagnosis not present

## 2021-02-14 DIAGNOSIS — R41841 Cognitive communication deficit: Secondary | ICD-10-CM | POA: Diagnosis not present

## 2021-02-14 DIAGNOSIS — R489 Unspecified symbolic dysfunctions: Secondary | ICD-10-CM | POA: Diagnosis not present

## 2021-02-14 DIAGNOSIS — R471 Dysarthria and anarthria: Secondary | ICD-10-CM | POA: Diagnosis not present

## 2021-02-17 DIAGNOSIS — R41841 Cognitive communication deficit: Secondary | ICD-10-CM | POA: Diagnosis not present

## 2021-02-17 DIAGNOSIS — R471 Dysarthria and anarthria: Secondary | ICD-10-CM | POA: Diagnosis not present

## 2021-02-17 DIAGNOSIS — R489 Unspecified symbolic dysfunctions: Secondary | ICD-10-CM | POA: Diagnosis not present

## 2021-02-18 DIAGNOSIS — R41841 Cognitive communication deficit: Secondary | ICD-10-CM | POA: Diagnosis not present

## 2021-02-18 DIAGNOSIS — R471 Dysarthria and anarthria: Secondary | ICD-10-CM | POA: Diagnosis not present

## 2021-02-18 DIAGNOSIS — R489 Unspecified symbolic dysfunctions: Secondary | ICD-10-CM | POA: Diagnosis not present

## 2021-02-19 DIAGNOSIS — R41841 Cognitive communication deficit: Secondary | ICD-10-CM | POA: Diagnosis not present

## 2021-02-19 DIAGNOSIS — E1169 Type 2 diabetes mellitus with other specified complication: Secondary | ICD-10-CM | POA: Diagnosis not present

## 2021-02-19 DIAGNOSIS — E785 Hyperlipidemia, unspecified: Secondary | ICD-10-CM | POA: Diagnosis not present

## 2021-02-19 DIAGNOSIS — R471 Dysarthria and anarthria: Secondary | ICD-10-CM | POA: Diagnosis not present

## 2021-02-19 DIAGNOSIS — I10 Essential (primary) hypertension: Secondary | ICD-10-CM | POA: Diagnosis not present

## 2021-02-19 DIAGNOSIS — R489 Unspecified symbolic dysfunctions: Secondary | ICD-10-CM | POA: Diagnosis not present

## 2021-02-19 DIAGNOSIS — E039 Hypothyroidism, unspecified: Secondary | ICD-10-CM | POA: Diagnosis not present

## 2021-02-21 DIAGNOSIS — R41841 Cognitive communication deficit: Secondary | ICD-10-CM | POA: Diagnosis not present

## 2021-02-21 DIAGNOSIS — R471 Dysarthria and anarthria: Secondary | ICD-10-CM | POA: Diagnosis not present

## 2021-02-21 DIAGNOSIS — R489 Unspecified symbolic dysfunctions: Secondary | ICD-10-CM | POA: Diagnosis not present

## 2021-02-24 DIAGNOSIS — R41841 Cognitive communication deficit: Secondary | ICD-10-CM | POA: Diagnosis not present

## 2021-02-24 DIAGNOSIS — R471 Dysarthria and anarthria: Secondary | ICD-10-CM | POA: Diagnosis not present

## 2021-02-24 DIAGNOSIS — R489 Unspecified symbolic dysfunctions: Secondary | ICD-10-CM | POA: Diagnosis not present

## 2021-02-25 DIAGNOSIS — R489 Unspecified symbolic dysfunctions: Secondary | ICD-10-CM | POA: Diagnosis not present

## 2021-02-25 DIAGNOSIS — R41841 Cognitive communication deficit: Secondary | ICD-10-CM | POA: Diagnosis not present

## 2021-02-25 DIAGNOSIS — R471 Dysarthria and anarthria: Secondary | ICD-10-CM | POA: Diagnosis not present

## 2021-02-26 DIAGNOSIS — R489 Unspecified symbolic dysfunctions: Secondary | ICD-10-CM | POA: Diagnosis not present

## 2021-02-26 DIAGNOSIS — R41841 Cognitive communication deficit: Secondary | ICD-10-CM | POA: Diagnosis not present

## 2021-02-26 DIAGNOSIS — R471 Dysarthria and anarthria: Secondary | ICD-10-CM | POA: Diagnosis not present

## 2021-03-03 DIAGNOSIS — R489 Unspecified symbolic dysfunctions: Secondary | ICD-10-CM | POA: Diagnosis not present

## 2021-03-03 DIAGNOSIS — R471 Dysarthria and anarthria: Secondary | ICD-10-CM | POA: Diagnosis not present

## 2021-03-03 DIAGNOSIS — R41841 Cognitive communication deficit: Secondary | ICD-10-CM | POA: Diagnosis not present

## 2021-03-07 DIAGNOSIS — E785 Hyperlipidemia, unspecified: Secondary | ICD-10-CM | POA: Diagnosis not present

## 2021-03-07 DIAGNOSIS — E039 Hypothyroidism, unspecified: Secondary | ICD-10-CM | POA: Diagnosis not present

## 2021-03-07 DIAGNOSIS — Z79899 Other long term (current) drug therapy: Secondary | ICD-10-CM | POA: Diagnosis not present

## 2021-03-19 DIAGNOSIS — Z23 Encounter for immunization: Secondary | ICD-10-CM | POA: Diagnosis not present

## 2021-03-24 DIAGNOSIS — Z853 Personal history of malignant neoplasm of breast: Secondary | ICD-10-CM | POA: Diagnosis not present

## 2021-03-24 DIAGNOSIS — E039 Hypothyroidism, unspecified: Secondary | ICD-10-CM | POA: Diagnosis not present

## 2021-03-24 DIAGNOSIS — E1169 Type 2 diabetes mellitus with other specified complication: Secondary | ICD-10-CM | POA: Diagnosis not present

## 2021-03-24 DIAGNOSIS — I10 Essential (primary) hypertension: Secondary | ICD-10-CM | POA: Diagnosis not present

## 2021-03-24 DIAGNOSIS — E785 Hyperlipidemia, unspecified: Secondary | ICD-10-CM | POA: Diagnosis not present

## 2021-05-07 DIAGNOSIS — B351 Tinea unguium: Secondary | ICD-10-CM | POA: Diagnosis not present

## 2021-05-15 DIAGNOSIS — E039 Hypothyroidism, unspecified: Secondary | ICD-10-CM | POA: Diagnosis not present

## 2021-05-20 DIAGNOSIS — E785 Hyperlipidemia, unspecified: Secondary | ICD-10-CM | POA: Diagnosis not present

## 2021-05-20 DIAGNOSIS — I10 Essential (primary) hypertension: Secondary | ICD-10-CM | POA: Diagnosis not present

## 2021-05-20 DIAGNOSIS — E039 Hypothyroidism, unspecified: Secondary | ICD-10-CM | POA: Diagnosis not present

## 2021-05-20 DIAGNOSIS — E1169 Type 2 diabetes mellitus with other specified complication: Secondary | ICD-10-CM | POA: Diagnosis not present

## 2021-05-20 DIAGNOSIS — Z853 Personal history of malignant neoplasm of breast: Secondary | ICD-10-CM | POA: Diagnosis not present

## 2021-05-30 DIAGNOSIS — H2512 Age-related nuclear cataract, left eye: Secondary | ICD-10-CM | POA: Diagnosis not present

## 2021-05-30 DIAGNOSIS — Z961 Presence of intraocular lens: Secondary | ICD-10-CM | POA: Diagnosis not present

## 2021-06-16 DIAGNOSIS — G3184 Mild cognitive impairment, so stated: Secondary | ICD-10-CM | POA: Diagnosis not present

## 2021-06-16 DIAGNOSIS — F200 Paranoid schizophrenia: Secondary | ICD-10-CM | POA: Diagnosis not present

## 2021-06-19 DIAGNOSIS — L603 Nail dystrophy: Secondary | ICD-10-CM | POA: Diagnosis not present

## 2021-06-19 DIAGNOSIS — E1159 Type 2 diabetes mellitus with other circulatory complications: Secondary | ICD-10-CM | POA: Diagnosis not present

## 2021-06-19 DIAGNOSIS — I739 Peripheral vascular disease, unspecified: Secondary | ICD-10-CM | POA: Diagnosis not present

## 2021-07-18 DIAGNOSIS — E1169 Type 2 diabetes mellitus with other specified complication: Secondary | ICD-10-CM | POA: Diagnosis not present

## 2021-07-18 DIAGNOSIS — I10 Essential (primary) hypertension: Secondary | ICD-10-CM | POA: Diagnosis not present

## 2021-08-25 DIAGNOSIS — I10 Essential (primary) hypertension: Secondary | ICD-10-CM | POA: Diagnosis not present

## 2021-08-25 DIAGNOSIS — Z23 Encounter for immunization: Secondary | ICD-10-CM | POA: Diagnosis not present

## 2021-08-25 DIAGNOSIS — E039 Hypothyroidism, unspecified: Secondary | ICD-10-CM | POA: Diagnosis not present

## 2021-08-25 DIAGNOSIS — E1169 Type 2 diabetes mellitus with other specified complication: Secondary | ICD-10-CM | POA: Diagnosis not present

## 2021-08-25 DIAGNOSIS — E785 Hyperlipidemia, unspecified: Secondary | ICD-10-CM | POA: Diagnosis not present

## 2021-08-25 DIAGNOSIS — F209 Schizophrenia, unspecified: Secondary | ICD-10-CM | POA: Diagnosis not present

## 2021-11-06 DIAGNOSIS — N3946 Mixed incontinence: Secondary | ICD-10-CM | POA: Diagnosis not present

## 2021-11-06 DIAGNOSIS — M6281 Muscle weakness (generalized): Secondary | ICD-10-CM | POA: Diagnosis not present

## 2021-11-07 DIAGNOSIS — M6281 Muscle weakness (generalized): Secondary | ICD-10-CM | POA: Diagnosis not present

## 2021-11-07 DIAGNOSIS — N3946 Mixed incontinence: Secondary | ICD-10-CM | POA: Diagnosis not present

## 2021-11-11 DIAGNOSIS — M6281 Muscle weakness (generalized): Secondary | ICD-10-CM | POA: Diagnosis not present

## 2021-11-11 DIAGNOSIS — N3946 Mixed incontinence: Secondary | ICD-10-CM | POA: Diagnosis not present

## 2021-11-12 DIAGNOSIS — N3946 Mixed incontinence: Secondary | ICD-10-CM | POA: Diagnosis not present

## 2021-11-12 DIAGNOSIS — M6281 Muscle weakness (generalized): Secondary | ICD-10-CM | POA: Diagnosis not present

## 2021-11-13 DIAGNOSIS — N3946 Mixed incontinence: Secondary | ICD-10-CM | POA: Diagnosis not present

## 2021-11-13 DIAGNOSIS — M6281 Muscle weakness (generalized): Secondary | ICD-10-CM | POA: Diagnosis not present

## 2021-11-17 DIAGNOSIS — M6281 Muscle weakness (generalized): Secondary | ICD-10-CM | POA: Diagnosis not present

## 2021-11-17 DIAGNOSIS — F200 Paranoid schizophrenia: Secondary | ICD-10-CM | POA: Diagnosis not present

## 2021-11-17 DIAGNOSIS — N3946 Mixed incontinence: Secondary | ICD-10-CM | POA: Diagnosis not present

## 2021-11-17 DIAGNOSIS — G3184 Mild cognitive impairment, so stated: Secondary | ICD-10-CM | POA: Diagnosis not present

## 2021-11-18 DIAGNOSIS — R2689 Other abnormalities of gait and mobility: Secondary | ICD-10-CM | POA: Diagnosis not present

## 2021-11-18 DIAGNOSIS — M6281 Muscle weakness (generalized): Secondary | ICD-10-CM | POA: Diagnosis not present

## 2021-11-18 DIAGNOSIS — N3946 Mixed incontinence: Secondary | ICD-10-CM | POA: Diagnosis not present

## 2021-11-18 DIAGNOSIS — R279 Unspecified lack of coordination: Secondary | ICD-10-CM | POA: Diagnosis not present

## 2021-11-20 DIAGNOSIS — R2689 Other abnormalities of gait and mobility: Secondary | ICD-10-CM | POA: Diagnosis not present

## 2021-11-20 DIAGNOSIS — M6281 Muscle weakness (generalized): Secondary | ICD-10-CM | POA: Diagnosis not present

## 2021-11-20 DIAGNOSIS — R279 Unspecified lack of coordination: Secondary | ICD-10-CM | POA: Diagnosis not present

## 2021-11-20 DIAGNOSIS — N3946 Mixed incontinence: Secondary | ICD-10-CM | POA: Diagnosis not present

## 2021-11-24 DIAGNOSIS — R279 Unspecified lack of coordination: Secondary | ICD-10-CM | POA: Diagnosis not present

## 2021-11-24 DIAGNOSIS — R2689 Other abnormalities of gait and mobility: Secondary | ICD-10-CM | POA: Diagnosis not present

## 2021-11-24 DIAGNOSIS — N3946 Mixed incontinence: Secondary | ICD-10-CM | POA: Diagnosis not present

## 2021-11-24 DIAGNOSIS — M6281 Muscle weakness (generalized): Secondary | ICD-10-CM | POA: Diagnosis not present

## 2021-11-25 DIAGNOSIS — M6281 Muscle weakness (generalized): Secondary | ICD-10-CM | POA: Diagnosis not present

## 2021-11-25 DIAGNOSIS — R279 Unspecified lack of coordination: Secondary | ICD-10-CM | POA: Diagnosis not present

## 2021-11-25 DIAGNOSIS — R2689 Other abnormalities of gait and mobility: Secondary | ICD-10-CM | POA: Diagnosis not present

## 2021-11-25 DIAGNOSIS — N3946 Mixed incontinence: Secondary | ICD-10-CM | POA: Diagnosis not present

## 2021-11-27 DIAGNOSIS — R2689 Other abnormalities of gait and mobility: Secondary | ICD-10-CM | POA: Diagnosis not present

## 2021-11-27 DIAGNOSIS — N3946 Mixed incontinence: Secondary | ICD-10-CM | POA: Diagnosis not present

## 2021-11-27 DIAGNOSIS — R279 Unspecified lack of coordination: Secondary | ICD-10-CM | POA: Diagnosis not present

## 2021-11-27 DIAGNOSIS — M6281 Muscle weakness (generalized): Secondary | ICD-10-CM | POA: Diagnosis not present

## 2021-11-28 DIAGNOSIS — H524 Presbyopia: Secondary | ICD-10-CM | POA: Diagnosis not present

## 2021-11-28 DIAGNOSIS — M6281 Muscle weakness (generalized): Secondary | ICD-10-CM | POA: Diagnosis not present

## 2021-11-28 DIAGNOSIS — N3946 Mixed incontinence: Secondary | ICD-10-CM | POA: Diagnosis not present

## 2021-11-28 DIAGNOSIS — R2689 Other abnormalities of gait and mobility: Secondary | ICD-10-CM | POA: Diagnosis not present

## 2021-11-28 DIAGNOSIS — H401131 Primary open-angle glaucoma, bilateral, mild stage: Secondary | ICD-10-CM | POA: Diagnosis not present

## 2021-11-28 DIAGNOSIS — R279 Unspecified lack of coordination: Secondary | ICD-10-CM | POA: Diagnosis not present

## 2021-12-01 DIAGNOSIS — M6281 Muscle weakness (generalized): Secondary | ICD-10-CM | POA: Diagnosis not present

## 2021-12-01 DIAGNOSIS — R2689 Other abnormalities of gait and mobility: Secondary | ICD-10-CM | POA: Diagnosis not present

## 2021-12-01 DIAGNOSIS — R279 Unspecified lack of coordination: Secondary | ICD-10-CM | POA: Diagnosis not present

## 2021-12-01 DIAGNOSIS — N3946 Mixed incontinence: Secondary | ICD-10-CM | POA: Diagnosis not present

## 2021-12-02 DIAGNOSIS — N3946 Mixed incontinence: Secondary | ICD-10-CM | POA: Diagnosis not present

## 2021-12-02 DIAGNOSIS — M6281 Muscle weakness (generalized): Secondary | ICD-10-CM | POA: Diagnosis not present

## 2021-12-02 DIAGNOSIS — R279 Unspecified lack of coordination: Secondary | ICD-10-CM | POA: Diagnosis not present

## 2021-12-02 DIAGNOSIS — R2689 Other abnormalities of gait and mobility: Secondary | ICD-10-CM | POA: Diagnosis not present

## 2021-12-03 DIAGNOSIS — R2689 Other abnormalities of gait and mobility: Secondary | ICD-10-CM | POA: Diagnosis not present

## 2021-12-03 DIAGNOSIS — M6281 Muscle weakness (generalized): Secondary | ICD-10-CM | POA: Diagnosis not present

## 2021-12-03 DIAGNOSIS — N3946 Mixed incontinence: Secondary | ICD-10-CM | POA: Diagnosis not present

## 2021-12-03 DIAGNOSIS — R279 Unspecified lack of coordination: Secondary | ICD-10-CM | POA: Diagnosis not present

## 2021-12-04 DIAGNOSIS — R2689 Other abnormalities of gait and mobility: Secondary | ICD-10-CM | POA: Diagnosis not present

## 2021-12-04 DIAGNOSIS — M6281 Muscle weakness (generalized): Secondary | ICD-10-CM | POA: Diagnosis not present

## 2021-12-04 DIAGNOSIS — R279 Unspecified lack of coordination: Secondary | ICD-10-CM | POA: Diagnosis not present

## 2021-12-04 DIAGNOSIS — N3946 Mixed incontinence: Secondary | ICD-10-CM | POA: Diagnosis not present

## 2021-12-05 DIAGNOSIS — M6281 Muscle weakness (generalized): Secondary | ICD-10-CM | POA: Diagnosis not present

## 2021-12-05 DIAGNOSIS — R2689 Other abnormalities of gait and mobility: Secondary | ICD-10-CM | POA: Diagnosis not present

## 2021-12-05 DIAGNOSIS — N3946 Mixed incontinence: Secondary | ICD-10-CM | POA: Diagnosis not present

## 2021-12-05 DIAGNOSIS — R279 Unspecified lack of coordination: Secondary | ICD-10-CM | POA: Diagnosis not present

## 2021-12-07 DIAGNOSIS — R2689 Other abnormalities of gait and mobility: Secondary | ICD-10-CM | POA: Diagnosis not present

## 2021-12-07 DIAGNOSIS — M6281 Muscle weakness (generalized): Secondary | ICD-10-CM | POA: Diagnosis not present

## 2021-12-07 DIAGNOSIS — N3946 Mixed incontinence: Secondary | ICD-10-CM | POA: Diagnosis not present

## 2021-12-07 DIAGNOSIS — R279 Unspecified lack of coordination: Secondary | ICD-10-CM | POA: Diagnosis not present

## 2021-12-09 DIAGNOSIS — M6281 Muscle weakness (generalized): Secondary | ICD-10-CM | POA: Diagnosis not present

## 2021-12-09 DIAGNOSIS — R2689 Other abnormalities of gait and mobility: Secondary | ICD-10-CM | POA: Diagnosis not present

## 2021-12-09 DIAGNOSIS — N3946 Mixed incontinence: Secondary | ICD-10-CM | POA: Diagnosis not present

## 2021-12-09 DIAGNOSIS — R279 Unspecified lack of coordination: Secondary | ICD-10-CM | POA: Diagnosis not present

## 2021-12-10 DIAGNOSIS — R2689 Other abnormalities of gait and mobility: Secondary | ICD-10-CM | POA: Diagnosis not present

## 2021-12-10 DIAGNOSIS — R279 Unspecified lack of coordination: Secondary | ICD-10-CM | POA: Diagnosis not present

## 2021-12-10 DIAGNOSIS — M6281 Muscle weakness (generalized): Secondary | ICD-10-CM | POA: Diagnosis not present

## 2021-12-10 DIAGNOSIS — N3946 Mixed incontinence: Secondary | ICD-10-CM | POA: Diagnosis not present

## 2021-12-11 DIAGNOSIS — M6281 Muscle weakness (generalized): Secondary | ICD-10-CM | POA: Diagnosis not present

## 2021-12-11 DIAGNOSIS — N3946 Mixed incontinence: Secondary | ICD-10-CM | POA: Diagnosis not present

## 2021-12-11 DIAGNOSIS — R2689 Other abnormalities of gait and mobility: Secondary | ICD-10-CM | POA: Diagnosis not present

## 2021-12-11 DIAGNOSIS — R279 Unspecified lack of coordination: Secondary | ICD-10-CM | POA: Diagnosis not present

## 2021-12-12 DIAGNOSIS — R2689 Other abnormalities of gait and mobility: Secondary | ICD-10-CM | POA: Diagnosis not present

## 2021-12-12 DIAGNOSIS — M6281 Muscle weakness (generalized): Secondary | ICD-10-CM | POA: Diagnosis not present

## 2021-12-12 DIAGNOSIS — R279 Unspecified lack of coordination: Secondary | ICD-10-CM | POA: Diagnosis not present

## 2021-12-12 DIAGNOSIS — N3946 Mixed incontinence: Secondary | ICD-10-CM | POA: Diagnosis not present

## 2021-12-16 DIAGNOSIS — R2689 Other abnormalities of gait and mobility: Secondary | ICD-10-CM | POA: Diagnosis not present

## 2021-12-16 DIAGNOSIS — R279 Unspecified lack of coordination: Secondary | ICD-10-CM | POA: Diagnosis not present

## 2021-12-16 DIAGNOSIS — N3946 Mixed incontinence: Secondary | ICD-10-CM | POA: Diagnosis not present

## 2021-12-16 DIAGNOSIS — M6281 Muscle weakness (generalized): Secondary | ICD-10-CM | POA: Diagnosis not present

## 2021-12-17 DIAGNOSIS — R279 Unspecified lack of coordination: Secondary | ICD-10-CM | POA: Diagnosis not present

## 2021-12-17 DIAGNOSIS — R2689 Other abnormalities of gait and mobility: Secondary | ICD-10-CM | POA: Diagnosis not present

## 2021-12-17 DIAGNOSIS — M6281 Muscle weakness (generalized): Secondary | ICD-10-CM | POA: Diagnosis not present

## 2021-12-17 DIAGNOSIS — N3946 Mixed incontinence: Secondary | ICD-10-CM | POA: Diagnosis not present

## 2021-12-18 DIAGNOSIS — M6281 Muscle weakness (generalized): Secondary | ICD-10-CM | POA: Diagnosis not present

## 2021-12-18 DIAGNOSIS — R279 Unspecified lack of coordination: Secondary | ICD-10-CM | POA: Diagnosis not present

## 2021-12-18 DIAGNOSIS — R2689 Other abnormalities of gait and mobility: Secondary | ICD-10-CM | POA: Diagnosis not present

## 2021-12-18 DIAGNOSIS — N3946 Mixed incontinence: Secondary | ICD-10-CM | POA: Diagnosis not present

## 2021-12-19 DIAGNOSIS — R279 Unspecified lack of coordination: Secondary | ICD-10-CM | POA: Diagnosis not present

## 2021-12-19 DIAGNOSIS — M6281 Muscle weakness (generalized): Secondary | ICD-10-CM | POA: Diagnosis not present

## 2021-12-19 DIAGNOSIS — N3946 Mixed incontinence: Secondary | ICD-10-CM | POA: Diagnosis not present

## 2021-12-19 DIAGNOSIS — R2689 Other abnormalities of gait and mobility: Secondary | ICD-10-CM | POA: Diagnosis not present

## 2021-12-22 DIAGNOSIS — M6281 Muscle weakness (generalized): Secondary | ICD-10-CM | POA: Diagnosis not present

## 2021-12-22 DIAGNOSIS — R279 Unspecified lack of coordination: Secondary | ICD-10-CM | POA: Diagnosis not present

## 2021-12-22 DIAGNOSIS — R2689 Other abnormalities of gait and mobility: Secondary | ICD-10-CM | POA: Diagnosis not present

## 2021-12-22 DIAGNOSIS — N3946 Mixed incontinence: Secondary | ICD-10-CM | POA: Diagnosis not present

## 2021-12-23 DIAGNOSIS — N3946 Mixed incontinence: Secondary | ICD-10-CM | POA: Diagnosis not present

## 2021-12-23 DIAGNOSIS — R2689 Other abnormalities of gait and mobility: Secondary | ICD-10-CM | POA: Diagnosis not present

## 2021-12-23 DIAGNOSIS — M6281 Muscle weakness (generalized): Secondary | ICD-10-CM | POA: Diagnosis not present

## 2021-12-23 DIAGNOSIS — R279 Unspecified lack of coordination: Secondary | ICD-10-CM | POA: Diagnosis not present

## 2021-12-24 DIAGNOSIS — N3946 Mixed incontinence: Secondary | ICD-10-CM | POA: Diagnosis not present

## 2021-12-24 DIAGNOSIS — R279 Unspecified lack of coordination: Secondary | ICD-10-CM | POA: Diagnosis not present

## 2021-12-24 DIAGNOSIS — R2689 Other abnormalities of gait and mobility: Secondary | ICD-10-CM | POA: Diagnosis not present

## 2021-12-24 DIAGNOSIS — M6281 Muscle weakness (generalized): Secondary | ICD-10-CM | POA: Diagnosis not present

## 2021-12-25 DIAGNOSIS — M6281 Muscle weakness (generalized): Secondary | ICD-10-CM | POA: Diagnosis not present

## 2021-12-25 DIAGNOSIS — N3946 Mixed incontinence: Secondary | ICD-10-CM | POA: Diagnosis not present

## 2021-12-25 DIAGNOSIS — R279 Unspecified lack of coordination: Secondary | ICD-10-CM | POA: Diagnosis not present

## 2021-12-25 DIAGNOSIS — R2689 Other abnormalities of gait and mobility: Secondary | ICD-10-CM | POA: Diagnosis not present

## 2021-12-26 DIAGNOSIS — R279 Unspecified lack of coordination: Secondary | ICD-10-CM | POA: Diagnosis not present

## 2021-12-26 DIAGNOSIS — N3946 Mixed incontinence: Secondary | ICD-10-CM | POA: Diagnosis not present

## 2021-12-26 DIAGNOSIS — M6281 Muscle weakness (generalized): Secondary | ICD-10-CM | POA: Diagnosis not present

## 2021-12-26 DIAGNOSIS — R2689 Other abnormalities of gait and mobility: Secondary | ICD-10-CM | POA: Diagnosis not present

## 2021-12-30 DIAGNOSIS — N3946 Mixed incontinence: Secondary | ICD-10-CM | POA: Diagnosis not present

## 2021-12-30 DIAGNOSIS — M6281 Muscle weakness (generalized): Secondary | ICD-10-CM | POA: Diagnosis not present

## 2021-12-30 DIAGNOSIS — R2689 Other abnormalities of gait and mobility: Secondary | ICD-10-CM | POA: Diagnosis not present

## 2021-12-30 DIAGNOSIS — R279 Unspecified lack of coordination: Secondary | ICD-10-CM | POA: Diagnosis not present

## 2021-12-31 DIAGNOSIS — R2689 Other abnormalities of gait and mobility: Secondary | ICD-10-CM | POA: Diagnosis not present

## 2021-12-31 DIAGNOSIS — N3946 Mixed incontinence: Secondary | ICD-10-CM | POA: Diagnosis not present

## 2021-12-31 DIAGNOSIS — R279 Unspecified lack of coordination: Secondary | ICD-10-CM | POA: Diagnosis not present

## 2021-12-31 DIAGNOSIS — M6281 Muscle weakness (generalized): Secondary | ICD-10-CM | POA: Diagnosis not present

## 2022-01-01 DIAGNOSIS — R279 Unspecified lack of coordination: Secondary | ICD-10-CM | POA: Diagnosis not present

## 2022-01-01 DIAGNOSIS — R2689 Other abnormalities of gait and mobility: Secondary | ICD-10-CM | POA: Diagnosis not present

## 2022-01-01 DIAGNOSIS — M6281 Muscle weakness (generalized): Secondary | ICD-10-CM | POA: Diagnosis not present

## 2022-01-01 DIAGNOSIS — N3946 Mixed incontinence: Secondary | ICD-10-CM | POA: Diagnosis not present

## 2022-01-02 DIAGNOSIS — R279 Unspecified lack of coordination: Secondary | ICD-10-CM | POA: Diagnosis not present

## 2022-01-02 DIAGNOSIS — R2689 Other abnormalities of gait and mobility: Secondary | ICD-10-CM | POA: Diagnosis not present

## 2022-01-02 DIAGNOSIS — N3946 Mixed incontinence: Secondary | ICD-10-CM | POA: Diagnosis not present

## 2022-01-02 DIAGNOSIS — M6281 Muscle weakness (generalized): Secondary | ICD-10-CM | POA: Diagnosis not present

## 2022-01-05 DIAGNOSIS — R2689 Other abnormalities of gait and mobility: Secondary | ICD-10-CM | POA: Diagnosis not present

## 2022-01-05 DIAGNOSIS — N3946 Mixed incontinence: Secondary | ICD-10-CM | POA: Diagnosis not present

## 2022-01-05 DIAGNOSIS — R279 Unspecified lack of coordination: Secondary | ICD-10-CM | POA: Diagnosis not present

## 2022-01-05 DIAGNOSIS — M6281 Muscle weakness (generalized): Secondary | ICD-10-CM | POA: Diagnosis not present

## 2022-01-07 DIAGNOSIS — F209 Schizophrenia, unspecified: Secondary | ICD-10-CM | POA: Diagnosis not present

## 2022-01-07 DIAGNOSIS — R279 Unspecified lack of coordination: Secondary | ICD-10-CM | POA: Diagnosis not present

## 2022-01-07 DIAGNOSIS — Z853 Personal history of malignant neoplasm of breast: Secondary | ICD-10-CM | POA: Diagnosis not present

## 2022-01-07 DIAGNOSIS — F17201 Nicotine dependence, unspecified, in remission: Secondary | ICD-10-CM | POA: Diagnosis not present

## 2022-01-07 DIAGNOSIS — Z0000 Encounter for general adult medical examination without abnormal findings: Secondary | ICD-10-CM | POA: Diagnosis not present

## 2022-01-07 DIAGNOSIS — Z23 Encounter for immunization: Secondary | ICD-10-CM | POA: Diagnosis not present

## 2022-01-07 DIAGNOSIS — R2689 Other abnormalities of gait and mobility: Secondary | ICD-10-CM | POA: Diagnosis not present

## 2022-01-07 DIAGNOSIS — M6281 Muscle weakness (generalized): Secondary | ICD-10-CM | POA: Diagnosis not present

## 2022-01-07 DIAGNOSIS — E1169 Type 2 diabetes mellitus with other specified complication: Secondary | ICD-10-CM | POA: Diagnosis not present

## 2022-01-07 DIAGNOSIS — J309 Allergic rhinitis, unspecified: Secondary | ICD-10-CM | POA: Diagnosis not present

## 2022-01-07 DIAGNOSIS — I10 Essential (primary) hypertension: Secondary | ICD-10-CM | POA: Diagnosis not present

## 2022-01-07 DIAGNOSIS — E039 Hypothyroidism, unspecified: Secondary | ICD-10-CM | POA: Diagnosis not present

## 2022-01-07 DIAGNOSIS — E785 Hyperlipidemia, unspecified: Secondary | ICD-10-CM | POA: Diagnosis not present

## 2022-01-07 DIAGNOSIS — M8588 Other specified disorders of bone density and structure, other site: Secondary | ICD-10-CM | POA: Diagnosis not present

## 2022-01-07 DIAGNOSIS — N3946 Mixed incontinence: Secondary | ICD-10-CM | POA: Diagnosis not present

## 2022-01-08 DIAGNOSIS — R279 Unspecified lack of coordination: Secondary | ICD-10-CM | POA: Diagnosis not present

## 2022-01-08 DIAGNOSIS — R2689 Other abnormalities of gait and mobility: Secondary | ICD-10-CM | POA: Diagnosis not present

## 2022-01-08 DIAGNOSIS — M6281 Muscle weakness (generalized): Secondary | ICD-10-CM | POA: Diagnosis not present

## 2022-01-08 DIAGNOSIS — N3946 Mixed incontinence: Secondary | ICD-10-CM | POA: Diagnosis not present

## 2022-01-09 DIAGNOSIS — R279 Unspecified lack of coordination: Secondary | ICD-10-CM | POA: Diagnosis not present

## 2022-01-09 DIAGNOSIS — R2689 Other abnormalities of gait and mobility: Secondary | ICD-10-CM | POA: Diagnosis not present

## 2022-01-09 DIAGNOSIS — N3946 Mixed incontinence: Secondary | ICD-10-CM | POA: Diagnosis not present

## 2022-01-09 DIAGNOSIS — M6281 Muscle weakness (generalized): Secondary | ICD-10-CM | POA: Diagnosis not present

## 2022-01-12 DIAGNOSIS — M6281 Muscle weakness (generalized): Secondary | ICD-10-CM | POA: Diagnosis not present

## 2022-01-12 DIAGNOSIS — N3946 Mixed incontinence: Secondary | ICD-10-CM | POA: Diagnosis not present

## 2022-01-12 DIAGNOSIS — R279 Unspecified lack of coordination: Secondary | ICD-10-CM | POA: Diagnosis not present

## 2022-01-12 DIAGNOSIS — R2689 Other abnormalities of gait and mobility: Secondary | ICD-10-CM | POA: Diagnosis not present

## 2022-01-13 DIAGNOSIS — M6281 Muscle weakness (generalized): Secondary | ICD-10-CM | POA: Diagnosis not present

## 2022-01-13 DIAGNOSIS — R279 Unspecified lack of coordination: Secondary | ICD-10-CM | POA: Diagnosis not present

## 2022-01-13 DIAGNOSIS — R2689 Other abnormalities of gait and mobility: Secondary | ICD-10-CM | POA: Diagnosis not present

## 2022-01-13 DIAGNOSIS — N3946 Mixed incontinence: Secondary | ICD-10-CM | POA: Diagnosis not present

## 2022-01-14 DIAGNOSIS — N3946 Mixed incontinence: Secondary | ICD-10-CM | POA: Diagnosis not present

## 2022-01-14 DIAGNOSIS — M6281 Muscle weakness (generalized): Secondary | ICD-10-CM | POA: Diagnosis not present

## 2022-01-14 DIAGNOSIS — R279 Unspecified lack of coordination: Secondary | ICD-10-CM | POA: Diagnosis not present

## 2022-01-14 DIAGNOSIS — R2689 Other abnormalities of gait and mobility: Secondary | ICD-10-CM | POA: Diagnosis not present

## 2022-01-15 DIAGNOSIS — R2689 Other abnormalities of gait and mobility: Secondary | ICD-10-CM | POA: Diagnosis not present

## 2022-01-15 DIAGNOSIS — N3946 Mixed incontinence: Secondary | ICD-10-CM | POA: Diagnosis not present

## 2022-01-15 DIAGNOSIS — R279 Unspecified lack of coordination: Secondary | ICD-10-CM | POA: Diagnosis not present

## 2022-01-15 DIAGNOSIS — M6281 Muscle weakness (generalized): Secondary | ICD-10-CM | POA: Diagnosis not present

## 2022-01-16 DIAGNOSIS — M6281 Muscle weakness (generalized): Secondary | ICD-10-CM | POA: Diagnosis not present

## 2022-01-16 DIAGNOSIS — R2689 Other abnormalities of gait and mobility: Secondary | ICD-10-CM | POA: Diagnosis not present

## 2022-01-16 DIAGNOSIS — N3946 Mixed incontinence: Secondary | ICD-10-CM | POA: Diagnosis not present

## 2022-01-16 DIAGNOSIS — R279 Unspecified lack of coordination: Secondary | ICD-10-CM | POA: Diagnosis not present

## 2022-01-19 DIAGNOSIS — R279 Unspecified lack of coordination: Secondary | ICD-10-CM | POA: Diagnosis not present

## 2022-01-19 DIAGNOSIS — N3946 Mixed incontinence: Secondary | ICD-10-CM | POA: Diagnosis not present

## 2022-01-19 DIAGNOSIS — R2689 Other abnormalities of gait and mobility: Secondary | ICD-10-CM | POA: Diagnosis not present

## 2022-01-19 DIAGNOSIS — M6281 Muscle weakness (generalized): Secondary | ICD-10-CM | POA: Diagnosis not present

## 2022-01-20 DIAGNOSIS — R2689 Other abnormalities of gait and mobility: Secondary | ICD-10-CM | POA: Diagnosis not present

## 2022-01-20 DIAGNOSIS — N3946 Mixed incontinence: Secondary | ICD-10-CM | POA: Diagnosis not present

## 2022-01-20 DIAGNOSIS — M6281 Muscle weakness (generalized): Secondary | ICD-10-CM | POA: Diagnosis not present

## 2022-01-20 DIAGNOSIS — R279 Unspecified lack of coordination: Secondary | ICD-10-CM | POA: Diagnosis not present

## 2022-01-22 DIAGNOSIS — N3946 Mixed incontinence: Secondary | ICD-10-CM | POA: Diagnosis not present

## 2022-01-22 DIAGNOSIS — M6281 Muscle weakness (generalized): Secondary | ICD-10-CM | POA: Diagnosis not present

## 2022-01-22 DIAGNOSIS — R279 Unspecified lack of coordination: Secondary | ICD-10-CM | POA: Diagnosis not present

## 2022-01-22 DIAGNOSIS — R2689 Other abnormalities of gait and mobility: Secondary | ICD-10-CM | POA: Diagnosis not present

## 2022-01-23 DIAGNOSIS — M6281 Muscle weakness (generalized): Secondary | ICD-10-CM | POA: Diagnosis not present

## 2022-01-23 DIAGNOSIS — R2689 Other abnormalities of gait and mobility: Secondary | ICD-10-CM | POA: Diagnosis not present

## 2022-01-23 DIAGNOSIS — N3946 Mixed incontinence: Secondary | ICD-10-CM | POA: Diagnosis not present

## 2022-01-23 DIAGNOSIS — R279 Unspecified lack of coordination: Secondary | ICD-10-CM | POA: Diagnosis not present

## 2022-01-26 DIAGNOSIS — M6281 Muscle weakness (generalized): Secondary | ICD-10-CM | POA: Diagnosis not present

## 2022-01-26 DIAGNOSIS — N3946 Mixed incontinence: Secondary | ICD-10-CM | POA: Diagnosis not present

## 2022-01-26 DIAGNOSIS — R2689 Other abnormalities of gait and mobility: Secondary | ICD-10-CM | POA: Diagnosis not present

## 2022-01-26 DIAGNOSIS — R279 Unspecified lack of coordination: Secondary | ICD-10-CM | POA: Diagnosis not present

## 2022-01-28 DIAGNOSIS — N3946 Mixed incontinence: Secondary | ICD-10-CM | POA: Diagnosis not present

## 2022-01-28 DIAGNOSIS — R279 Unspecified lack of coordination: Secondary | ICD-10-CM | POA: Diagnosis not present

## 2022-01-28 DIAGNOSIS — M6281 Muscle weakness (generalized): Secondary | ICD-10-CM | POA: Diagnosis not present

## 2022-01-28 DIAGNOSIS — R2689 Other abnormalities of gait and mobility: Secondary | ICD-10-CM | POA: Diagnosis not present

## 2022-01-29 DIAGNOSIS — N3946 Mixed incontinence: Secondary | ICD-10-CM | POA: Diagnosis not present

## 2022-01-29 DIAGNOSIS — R2689 Other abnormalities of gait and mobility: Secondary | ICD-10-CM | POA: Diagnosis not present

## 2022-01-29 DIAGNOSIS — M6281 Muscle weakness (generalized): Secondary | ICD-10-CM | POA: Diagnosis not present

## 2022-01-29 DIAGNOSIS — R279 Unspecified lack of coordination: Secondary | ICD-10-CM | POA: Diagnosis not present

## 2022-02-02 DIAGNOSIS — N3946 Mixed incontinence: Secondary | ICD-10-CM | POA: Diagnosis not present

## 2022-02-02 DIAGNOSIS — M79671 Pain in right foot: Secondary | ICD-10-CM | POA: Diagnosis not present

## 2022-02-02 DIAGNOSIS — M6281 Muscle weakness (generalized): Secondary | ICD-10-CM | POA: Diagnosis not present

## 2022-02-02 DIAGNOSIS — B351 Tinea unguium: Secondary | ICD-10-CM | POA: Diagnosis not present

## 2022-02-02 DIAGNOSIS — M79672 Pain in left foot: Secondary | ICD-10-CM | POA: Diagnosis not present

## 2022-02-02 DIAGNOSIS — L600 Ingrowing nail: Secondary | ICD-10-CM | POA: Diagnosis not present

## 2022-02-02 DIAGNOSIS — R279 Unspecified lack of coordination: Secondary | ICD-10-CM | POA: Diagnosis not present

## 2022-02-02 DIAGNOSIS — R2689 Other abnormalities of gait and mobility: Secondary | ICD-10-CM | POA: Diagnosis not present

## 2022-02-02 DIAGNOSIS — E1140 Type 2 diabetes mellitus with diabetic neuropathy, unspecified: Secondary | ICD-10-CM | POA: Diagnosis not present

## 2022-02-03 DIAGNOSIS — R2689 Other abnormalities of gait and mobility: Secondary | ICD-10-CM | POA: Diagnosis not present

## 2022-02-03 DIAGNOSIS — R279 Unspecified lack of coordination: Secondary | ICD-10-CM | POA: Diagnosis not present

## 2022-02-03 DIAGNOSIS — M6281 Muscle weakness (generalized): Secondary | ICD-10-CM | POA: Diagnosis not present

## 2022-02-03 DIAGNOSIS — N3946 Mixed incontinence: Secondary | ICD-10-CM | POA: Diagnosis not present

## 2022-02-04 DIAGNOSIS — N3946 Mixed incontinence: Secondary | ICD-10-CM | POA: Diagnosis not present

## 2022-02-04 DIAGNOSIS — R2689 Other abnormalities of gait and mobility: Secondary | ICD-10-CM | POA: Diagnosis not present

## 2022-02-04 DIAGNOSIS — M6281 Muscle weakness (generalized): Secondary | ICD-10-CM | POA: Diagnosis not present

## 2022-02-04 DIAGNOSIS — R279 Unspecified lack of coordination: Secondary | ICD-10-CM | POA: Diagnosis not present

## 2022-02-05 DIAGNOSIS — M6281 Muscle weakness (generalized): Secondary | ICD-10-CM | POA: Diagnosis not present

## 2022-02-05 DIAGNOSIS — R279 Unspecified lack of coordination: Secondary | ICD-10-CM | POA: Diagnosis not present

## 2022-02-05 DIAGNOSIS — N3946 Mixed incontinence: Secondary | ICD-10-CM | POA: Diagnosis not present

## 2022-02-05 DIAGNOSIS — R2689 Other abnormalities of gait and mobility: Secondary | ICD-10-CM | POA: Diagnosis not present

## 2022-02-06 DIAGNOSIS — Z23 Encounter for immunization: Secondary | ICD-10-CM | POA: Diagnosis not present

## 2022-02-06 DIAGNOSIS — R279 Unspecified lack of coordination: Secondary | ICD-10-CM | POA: Diagnosis not present

## 2022-02-06 DIAGNOSIS — M6281 Muscle weakness (generalized): Secondary | ICD-10-CM | POA: Diagnosis not present

## 2022-02-06 DIAGNOSIS — R2689 Other abnormalities of gait and mobility: Secondary | ICD-10-CM | POA: Diagnosis not present

## 2022-02-06 DIAGNOSIS — N3946 Mixed incontinence: Secondary | ICD-10-CM | POA: Diagnosis not present

## 2022-02-09 DIAGNOSIS — M6281 Muscle weakness (generalized): Secondary | ICD-10-CM | POA: Diagnosis not present

## 2022-02-09 DIAGNOSIS — R2689 Other abnormalities of gait and mobility: Secondary | ICD-10-CM | POA: Diagnosis not present

## 2022-02-09 DIAGNOSIS — R279 Unspecified lack of coordination: Secondary | ICD-10-CM | POA: Diagnosis not present

## 2022-02-09 DIAGNOSIS — N3946 Mixed incontinence: Secondary | ICD-10-CM | POA: Diagnosis not present

## 2022-02-10 DIAGNOSIS — R2689 Other abnormalities of gait and mobility: Secondary | ICD-10-CM | POA: Diagnosis not present

## 2022-02-10 DIAGNOSIS — M6281 Muscle weakness (generalized): Secondary | ICD-10-CM | POA: Diagnosis not present

## 2022-02-10 DIAGNOSIS — N3946 Mixed incontinence: Secondary | ICD-10-CM | POA: Diagnosis not present

## 2022-02-10 DIAGNOSIS — R279 Unspecified lack of coordination: Secondary | ICD-10-CM | POA: Diagnosis not present

## 2022-02-11 DIAGNOSIS — R2689 Other abnormalities of gait and mobility: Secondary | ICD-10-CM | POA: Diagnosis not present

## 2022-02-11 DIAGNOSIS — M6281 Muscle weakness (generalized): Secondary | ICD-10-CM | POA: Diagnosis not present

## 2022-02-11 DIAGNOSIS — N3946 Mixed incontinence: Secondary | ICD-10-CM | POA: Diagnosis not present

## 2022-02-11 DIAGNOSIS — R279 Unspecified lack of coordination: Secondary | ICD-10-CM | POA: Diagnosis not present

## 2022-02-12 DIAGNOSIS — R2689 Other abnormalities of gait and mobility: Secondary | ICD-10-CM | POA: Diagnosis not present

## 2022-02-12 DIAGNOSIS — R279 Unspecified lack of coordination: Secondary | ICD-10-CM | POA: Diagnosis not present

## 2022-02-12 DIAGNOSIS — M6281 Muscle weakness (generalized): Secondary | ICD-10-CM | POA: Diagnosis not present

## 2022-02-12 DIAGNOSIS — N3946 Mixed incontinence: Secondary | ICD-10-CM | POA: Diagnosis not present

## 2022-02-13 DIAGNOSIS — R2689 Other abnormalities of gait and mobility: Secondary | ICD-10-CM | POA: Diagnosis not present

## 2022-02-13 DIAGNOSIS — R279 Unspecified lack of coordination: Secondary | ICD-10-CM | POA: Diagnosis not present

## 2022-02-13 DIAGNOSIS — M6281 Muscle weakness (generalized): Secondary | ICD-10-CM | POA: Diagnosis not present

## 2022-02-13 DIAGNOSIS — N3946 Mixed incontinence: Secondary | ICD-10-CM | POA: Diagnosis not present

## 2022-02-17 DIAGNOSIS — R2689 Other abnormalities of gait and mobility: Secondary | ICD-10-CM | POA: Diagnosis not present

## 2022-02-17 DIAGNOSIS — N3946 Mixed incontinence: Secondary | ICD-10-CM | POA: Diagnosis not present

## 2022-02-17 DIAGNOSIS — M6281 Muscle weakness (generalized): Secondary | ICD-10-CM | POA: Diagnosis not present

## 2022-02-17 DIAGNOSIS — R279 Unspecified lack of coordination: Secondary | ICD-10-CM | POA: Diagnosis not present

## 2022-02-18 DIAGNOSIS — M6281 Muscle weakness (generalized): Secondary | ICD-10-CM | POA: Diagnosis not present

## 2022-02-18 DIAGNOSIS — N3946 Mixed incontinence: Secondary | ICD-10-CM | POA: Diagnosis not present

## 2022-02-18 DIAGNOSIS — R279 Unspecified lack of coordination: Secondary | ICD-10-CM | POA: Diagnosis not present

## 2022-02-18 DIAGNOSIS — R2689 Other abnormalities of gait and mobility: Secondary | ICD-10-CM | POA: Diagnosis not present

## 2022-02-19 DIAGNOSIS — R2689 Other abnormalities of gait and mobility: Secondary | ICD-10-CM | POA: Diagnosis not present

## 2022-02-19 DIAGNOSIS — N3946 Mixed incontinence: Secondary | ICD-10-CM | POA: Diagnosis not present

## 2022-02-19 DIAGNOSIS — M6281 Muscle weakness (generalized): Secondary | ICD-10-CM | POA: Diagnosis not present

## 2022-02-19 DIAGNOSIS — R279 Unspecified lack of coordination: Secondary | ICD-10-CM | POA: Diagnosis not present

## 2022-02-20 DIAGNOSIS — N3946 Mixed incontinence: Secondary | ICD-10-CM | POA: Diagnosis not present

## 2022-02-20 DIAGNOSIS — M6281 Muscle weakness (generalized): Secondary | ICD-10-CM | POA: Diagnosis not present

## 2022-02-20 DIAGNOSIS — R279 Unspecified lack of coordination: Secondary | ICD-10-CM | POA: Diagnosis not present

## 2022-02-20 DIAGNOSIS — R2689 Other abnormalities of gait and mobility: Secondary | ICD-10-CM | POA: Diagnosis not present

## 2022-02-23 DIAGNOSIS — M6281 Muscle weakness (generalized): Secondary | ICD-10-CM | POA: Diagnosis not present

## 2022-02-23 DIAGNOSIS — R279 Unspecified lack of coordination: Secondary | ICD-10-CM | POA: Diagnosis not present

## 2022-02-23 DIAGNOSIS — N3946 Mixed incontinence: Secondary | ICD-10-CM | POA: Diagnosis not present

## 2022-02-23 DIAGNOSIS — R2689 Other abnormalities of gait and mobility: Secondary | ICD-10-CM | POA: Diagnosis not present

## 2022-02-24 DIAGNOSIS — N3946 Mixed incontinence: Secondary | ICD-10-CM | POA: Diagnosis not present

## 2022-02-24 DIAGNOSIS — R279 Unspecified lack of coordination: Secondary | ICD-10-CM | POA: Diagnosis not present

## 2022-02-24 DIAGNOSIS — R2689 Other abnormalities of gait and mobility: Secondary | ICD-10-CM | POA: Diagnosis not present

## 2022-02-24 DIAGNOSIS — M6281 Muscle weakness (generalized): Secondary | ICD-10-CM | POA: Diagnosis not present

## 2022-02-26 DIAGNOSIS — R2689 Other abnormalities of gait and mobility: Secondary | ICD-10-CM | POA: Diagnosis not present

## 2022-02-26 DIAGNOSIS — R279 Unspecified lack of coordination: Secondary | ICD-10-CM | POA: Diagnosis not present

## 2022-02-26 DIAGNOSIS — M6281 Muscle weakness (generalized): Secondary | ICD-10-CM | POA: Diagnosis not present

## 2022-02-26 DIAGNOSIS — N3946 Mixed incontinence: Secondary | ICD-10-CM | POA: Diagnosis not present

## 2022-03-02 DIAGNOSIS — M6281 Muscle weakness (generalized): Secondary | ICD-10-CM | POA: Diagnosis not present

## 2022-03-02 DIAGNOSIS — N3946 Mixed incontinence: Secondary | ICD-10-CM | POA: Diagnosis not present

## 2022-03-02 DIAGNOSIS — R279 Unspecified lack of coordination: Secondary | ICD-10-CM | POA: Diagnosis not present

## 2022-03-02 DIAGNOSIS — R2689 Other abnormalities of gait and mobility: Secondary | ICD-10-CM | POA: Diagnosis not present

## 2022-03-03 DIAGNOSIS — N3946 Mixed incontinence: Secondary | ICD-10-CM | POA: Diagnosis not present

## 2022-03-03 DIAGNOSIS — M6281 Muscle weakness (generalized): Secondary | ICD-10-CM | POA: Diagnosis not present

## 2022-03-03 DIAGNOSIS — R2689 Other abnormalities of gait and mobility: Secondary | ICD-10-CM | POA: Diagnosis not present

## 2022-03-03 DIAGNOSIS — R279 Unspecified lack of coordination: Secondary | ICD-10-CM | POA: Diagnosis not present

## 2022-03-05 DIAGNOSIS — R2689 Other abnormalities of gait and mobility: Secondary | ICD-10-CM | POA: Diagnosis not present

## 2022-03-05 DIAGNOSIS — R279 Unspecified lack of coordination: Secondary | ICD-10-CM | POA: Diagnosis not present

## 2022-03-05 DIAGNOSIS — M6281 Muscle weakness (generalized): Secondary | ICD-10-CM | POA: Diagnosis not present

## 2022-03-05 DIAGNOSIS — N3946 Mixed incontinence: Secondary | ICD-10-CM | POA: Diagnosis not present

## 2022-03-06 DIAGNOSIS — N3946 Mixed incontinence: Secondary | ICD-10-CM | POA: Diagnosis not present

## 2022-03-06 DIAGNOSIS — R279 Unspecified lack of coordination: Secondary | ICD-10-CM | POA: Diagnosis not present

## 2022-03-06 DIAGNOSIS — M6281 Muscle weakness (generalized): Secondary | ICD-10-CM | POA: Diagnosis not present

## 2022-03-06 DIAGNOSIS — R2689 Other abnormalities of gait and mobility: Secondary | ICD-10-CM | POA: Diagnosis not present

## 2022-03-09 DIAGNOSIS — N3946 Mixed incontinence: Secondary | ICD-10-CM | POA: Diagnosis not present

## 2022-03-09 DIAGNOSIS — R2689 Other abnormalities of gait and mobility: Secondary | ICD-10-CM | POA: Diagnosis not present

## 2022-03-09 DIAGNOSIS — R279 Unspecified lack of coordination: Secondary | ICD-10-CM | POA: Diagnosis not present

## 2022-03-09 DIAGNOSIS — M6281 Muscle weakness (generalized): Secondary | ICD-10-CM | POA: Diagnosis not present

## 2022-03-10 DIAGNOSIS — R279 Unspecified lack of coordination: Secondary | ICD-10-CM | POA: Diagnosis not present

## 2022-03-10 DIAGNOSIS — M6281 Muscle weakness (generalized): Secondary | ICD-10-CM | POA: Diagnosis not present

## 2022-03-10 DIAGNOSIS — N3946 Mixed incontinence: Secondary | ICD-10-CM | POA: Diagnosis not present

## 2022-03-10 DIAGNOSIS — R2689 Other abnormalities of gait and mobility: Secondary | ICD-10-CM | POA: Diagnosis not present

## 2022-03-11 DIAGNOSIS — N3946 Mixed incontinence: Secondary | ICD-10-CM | POA: Diagnosis not present

## 2022-03-11 DIAGNOSIS — R279 Unspecified lack of coordination: Secondary | ICD-10-CM | POA: Diagnosis not present

## 2022-03-11 DIAGNOSIS — M6281 Muscle weakness (generalized): Secondary | ICD-10-CM | POA: Diagnosis not present

## 2022-03-11 DIAGNOSIS — R2689 Other abnormalities of gait and mobility: Secondary | ICD-10-CM | POA: Diagnosis not present

## 2022-03-13 DIAGNOSIS — R2689 Other abnormalities of gait and mobility: Secondary | ICD-10-CM | POA: Diagnosis not present

## 2022-03-13 DIAGNOSIS — M6281 Muscle weakness (generalized): Secondary | ICD-10-CM | POA: Diagnosis not present

## 2022-03-13 DIAGNOSIS — R279 Unspecified lack of coordination: Secondary | ICD-10-CM | POA: Diagnosis not present

## 2022-03-13 DIAGNOSIS — N3946 Mixed incontinence: Secondary | ICD-10-CM | POA: Diagnosis not present

## 2022-03-16 DIAGNOSIS — R279 Unspecified lack of coordination: Secondary | ICD-10-CM | POA: Diagnosis not present

## 2022-03-16 DIAGNOSIS — M6281 Muscle weakness (generalized): Secondary | ICD-10-CM | POA: Diagnosis not present

## 2022-03-16 DIAGNOSIS — R2689 Other abnormalities of gait and mobility: Secondary | ICD-10-CM | POA: Diagnosis not present

## 2022-03-16 DIAGNOSIS — N3946 Mixed incontinence: Secondary | ICD-10-CM | POA: Diagnosis not present

## 2022-03-17 DIAGNOSIS — M6281 Muscle weakness (generalized): Secondary | ICD-10-CM | POA: Diagnosis not present

## 2022-03-17 DIAGNOSIS — R2689 Other abnormalities of gait and mobility: Secondary | ICD-10-CM | POA: Diagnosis not present

## 2022-03-17 DIAGNOSIS — R279 Unspecified lack of coordination: Secondary | ICD-10-CM | POA: Diagnosis not present

## 2022-03-17 DIAGNOSIS — N3946 Mixed incontinence: Secondary | ICD-10-CM | POA: Diagnosis not present

## 2022-03-19 DIAGNOSIS — M6281 Muscle weakness (generalized): Secondary | ICD-10-CM | POA: Diagnosis not present

## 2022-03-19 DIAGNOSIS — N3946 Mixed incontinence: Secondary | ICD-10-CM | POA: Diagnosis not present

## 2022-03-19 DIAGNOSIS — R2689 Other abnormalities of gait and mobility: Secondary | ICD-10-CM | POA: Diagnosis not present

## 2022-03-19 DIAGNOSIS — R279 Unspecified lack of coordination: Secondary | ICD-10-CM | POA: Diagnosis not present

## 2022-03-23 DIAGNOSIS — N3946 Mixed incontinence: Secondary | ICD-10-CM | POA: Diagnosis not present

## 2022-03-23 DIAGNOSIS — M6281 Muscle weakness (generalized): Secondary | ICD-10-CM | POA: Diagnosis not present

## 2022-03-23 DIAGNOSIS — R279 Unspecified lack of coordination: Secondary | ICD-10-CM | POA: Diagnosis not present

## 2022-03-23 DIAGNOSIS — R2689 Other abnormalities of gait and mobility: Secondary | ICD-10-CM | POA: Diagnosis not present

## 2022-03-24 DIAGNOSIS — R279 Unspecified lack of coordination: Secondary | ICD-10-CM | POA: Diagnosis not present

## 2022-03-24 DIAGNOSIS — M6281 Muscle weakness (generalized): Secondary | ICD-10-CM | POA: Diagnosis not present

## 2022-03-24 DIAGNOSIS — N3946 Mixed incontinence: Secondary | ICD-10-CM | POA: Diagnosis not present

## 2022-03-24 DIAGNOSIS — R2689 Other abnormalities of gait and mobility: Secondary | ICD-10-CM | POA: Diagnosis not present

## 2022-03-27 DIAGNOSIS — N3946 Mixed incontinence: Secondary | ICD-10-CM | POA: Diagnosis not present

## 2022-03-27 DIAGNOSIS — M6281 Muscle weakness (generalized): Secondary | ICD-10-CM | POA: Diagnosis not present

## 2022-03-27 DIAGNOSIS — R2689 Other abnormalities of gait and mobility: Secondary | ICD-10-CM | POA: Diagnosis not present

## 2022-03-27 DIAGNOSIS — R279 Unspecified lack of coordination: Secondary | ICD-10-CM | POA: Diagnosis not present

## 2022-03-30 DIAGNOSIS — N3946 Mixed incontinence: Secondary | ICD-10-CM | POA: Diagnosis not present

## 2022-03-30 DIAGNOSIS — R2689 Other abnormalities of gait and mobility: Secondary | ICD-10-CM | POA: Diagnosis not present

## 2022-03-30 DIAGNOSIS — M6281 Muscle weakness (generalized): Secondary | ICD-10-CM | POA: Diagnosis not present

## 2022-03-30 DIAGNOSIS — R279 Unspecified lack of coordination: Secondary | ICD-10-CM | POA: Diagnosis not present

## 2022-03-31 DIAGNOSIS — N3946 Mixed incontinence: Secondary | ICD-10-CM | POA: Diagnosis not present

## 2022-03-31 DIAGNOSIS — R279 Unspecified lack of coordination: Secondary | ICD-10-CM | POA: Diagnosis not present

## 2022-03-31 DIAGNOSIS — M6281 Muscle weakness (generalized): Secondary | ICD-10-CM | POA: Diagnosis not present

## 2022-03-31 DIAGNOSIS — R2689 Other abnormalities of gait and mobility: Secondary | ICD-10-CM | POA: Diagnosis not present

## 2022-04-02 DIAGNOSIS — R2689 Other abnormalities of gait and mobility: Secondary | ICD-10-CM | POA: Diagnosis not present

## 2022-04-02 DIAGNOSIS — R279 Unspecified lack of coordination: Secondary | ICD-10-CM | POA: Diagnosis not present

## 2022-04-02 DIAGNOSIS — N3946 Mixed incontinence: Secondary | ICD-10-CM | POA: Diagnosis not present

## 2022-04-02 DIAGNOSIS — M6281 Muscle weakness (generalized): Secondary | ICD-10-CM | POA: Diagnosis not present

## 2022-04-05 ENCOUNTER — Encounter (HOSPITAL_COMMUNITY): Payer: Self-pay

## 2022-04-05 ENCOUNTER — Emergency Department (HOSPITAL_COMMUNITY)
Admission: EM | Admit: 2022-04-05 | Discharge: 2022-04-06 | Disposition: A | Discharge status: Skilled Nursing Facility | Payer: Medicare Other | Attending: Emergency Medicine | Admitting: Emergency Medicine

## 2022-04-05 ENCOUNTER — Other Ambulatory Visit: Payer: Self-pay

## 2022-04-05 ENCOUNTER — Emergency Department (HOSPITAL_COMMUNITY): Payer: Medicare Other

## 2022-04-05 DIAGNOSIS — J9801 Acute bronchospasm: Secondary | ICD-10-CM | POA: Insufficient documentation

## 2022-04-05 DIAGNOSIS — R059 Cough, unspecified: Secondary | ICD-10-CM | POA: Diagnosis not present

## 2022-04-05 DIAGNOSIS — Z20822 Contact with and (suspected) exposure to covid-19: Secondary | ICD-10-CM | POA: Insufficient documentation

## 2022-04-05 DIAGNOSIS — Z79899 Other long term (current) drug therapy: Secondary | ICD-10-CM | POA: Diagnosis not present

## 2022-04-05 DIAGNOSIS — F0390 Unspecified dementia without behavioral disturbance: Secondary | ICD-10-CM | POA: Diagnosis not present

## 2022-04-05 DIAGNOSIS — J101 Influenza due to other identified influenza virus with other respiratory manifestations: Secondary | ICD-10-CM | POA: Diagnosis not present

## 2022-04-05 DIAGNOSIS — E119 Type 2 diabetes mellitus without complications: Secondary | ICD-10-CM | POA: Diagnosis not present

## 2022-04-05 DIAGNOSIS — I10 Essential (primary) hypertension: Secondary | ICD-10-CM | POA: Diagnosis not present

## 2022-04-05 DIAGNOSIS — R739 Hyperglycemia, unspecified: Secondary | ICD-10-CM | POA: Diagnosis not present

## 2022-04-05 LAB — CBC
HCT: 40.8 % (ref 36.0–46.0)
Hemoglobin: 13.4 g/dL (ref 12.0–15.0)
MCH: 28.7 pg (ref 26.0–34.0)
MCHC: 32.8 g/dL (ref 30.0–36.0)
MCV: 87.4 fL (ref 80.0–100.0)
Platelets: 169 10*3/uL (ref 150–400)
RBC: 4.67 MIL/uL (ref 3.87–5.11)
RDW: 12.7 % (ref 11.5–15.5)
WBC: 5.9 10*3/uL (ref 4.0–10.5)
nRBC: 0 % (ref 0.0–0.2)

## 2022-04-05 LAB — COMPREHENSIVE METABOLIC PANEL WITH GFR
ALT: 13 U/L (ref 0–44)
AST: 20 U/L (ref 15–41)
Albumin: 3.3 g/dL — ABNORMAL LOW (ref 3.5–5.0)
Alkaline Phosphatase: 56 U/L (ref 38–126)
Anion gap: 11 (ref 5–15)
BUN: 27 mg/dL — ABNORMAL HIGH (ref 8–23)
CO2: 26 mmol/L (ref 22–32)
Calcium: 9.1 mg/dL (ref 8.9–10.3)
Chloride: 98 mmol/L (ref 98–111)
Creatinine, Ser: 1.02 mg/dL — ABNORMAL HIGH (ref 0.44–1.00)
GFR, Estimated: 57 mL/min — ABNORMAL LOW
Glucose, Bld: 249 mg/dL — ABNORMAL HIGH (ref 70–99)
Potassium: 3.9 mmol/L (ref 3.5–5.1)
Sodium: 135 mmol/L (ref 135–145)
Total Bilirubin: 0.3 mg/dL (ref 0.3–1.2)
Total Protein: 6.8 g/dL (ref 6.5–8.1)

## 2022-04-05 LAB — RESP PANEL BY RT-PCR (RSV, FLU A&B, COVID)  RVPGX2
Influenza A by PCR: POSITIVE — AB
Influenza B by PCR: NEGATIVE
Resp Syncytial Virus by PCR: NEGATIVE
SARS Coronavirus 2 by RT PCR: NEGATIVE

## 2022-04-05 LAB — URINALYSIS, ROUTINE W REFLEX MICROSCOPIC
Bilirubin Urine: NEGATIVE
Glucose, UA: 50 mg/dL — AB
Hgb urine dipstick: NEGATIVE
Ketones, ur: 5 mg/dL — AB
Leukocytes,Ua: NEGATIVE
Nitrite: NEGATIVE
Protein, ur: NEGATIVE mg/dL
Specific Gravity, Urine: 1.017 (ref 1.005–1.030)
pH: 5 (ref 5.0–8.0)

## 2022-04-05 MED ORDER — ALBUTEROL SULFATE HFA 108 (90 BASE) MCG/ACT IN AERS
1.0000 | INHALATION_SPRAY | Freq: Once | RESPIRATORY_TRACT | Status: AC
  Administered 2022-04-05: 1 via RESPIRATORY_TRACT
  Filled 2022-04-05: qty 6.7

## 2022-04-05 MED ORDER — IPRATROPIUM-ALBUTEROL 0.5-2.5 (3) MG/3ML IN SOLN
3.0000 mL | Freq: Once | RESPIRATORY_TRACT | Status: AC
  Administered 2022-04-05: 3 mL via RESPIRATORY_TRACT
  Filled 2022-04-05: qty 3

## 2022-04-05 MED ORDER — OSELTAMIVIR PHOSPHATE 75 MG PO CAPS: 75.0000 mg | ORAL_CAPSULE | Freq: Two times a day (BID) | ORAL | 0 refills | Status: DC

## 2022-04-05 MED ORDER — OSELTAMIVIR PHOSPHATE 75 MG PO CAPS
75.0000 mg | ORAL_CAPSULE | Freq: Once | ORAL | Status: AC
  Administered 2022-04-05: 75 mg via ORAL
  Filled 2022-04-05: qty 1

## 2022-04-05 NOTE — ED Notes (Signed)
Showed pt. How to use inhaler

## 2022-04-05 NOTE — ED Triage Notes (Signed)
EMS gave report to  the Triage EDPA.  Patient is from [facility]. Patient has a history of dementia and is at her baseline. Patient c/o left leg pain.  Facility  that the patient came from is having an outbreaks of Covid.

## 2022-04-05 NOTE — ED Provider Notes (Signed)
[facility] DEPT Provider Note   CSN: [id number] Arrival date & time: 04/05/22  1415     History  Chief Complaint  Patient presents with   Fatigue   Leg Pain   Cough    TYIANA HILL is a 76 y.o. female with history of diabetes, HTN, HLD, schizophrenia, and dementia.  Pt coming in from [facility] facility where apparently they are experiencing a covid outbreak. Pt has been more tired and coughing for the past 3-4 days, incontinent of urine. At her baseline mental status. Pt initially complained of left leg pain, but has no complaints on my further evaluation. CBG 293 with EMS   Level 5 caveat due to dementia   Leg Pain Cough      Home Medications Prior to Admission medications   Medication Sig Start Date End Date Taking? Authorizing Provider  oseltamivir (TAMIFLU) 75 MG capsule Take 1 capsule (75 mg total) by mouth every 12 (twelve) hours. 04/05/22  Yes Aryella Besecker T, PA-C  acetaminophen (TYLENOL) 500 MG tablet Take 500 mg by mouth every 6 (six) hours as needed.      [provider]  amLODipine (NORVASC) 5 MG tablet Take 5 mg by mouth daily.      [provider]  atenolol (TENORMIN) 50 MG tablet Take 25 mg by mouth daily.     [provider]  calcium carbonate (TUMS - DOSED IN MG ELEMENTAL CALCIUM) 500 MG chewable tablet Chew 1 tablet by mouth as needed.     [provider]  calcium-vitamin D (OSCAL WITH D) 500-200 MG-UNIT tablet Take 1 tablet by mouth daily. 12/22/16   Nicholas Lose, MD  Cholecalciferol (RA VITAMIN D-3) 2000 units CAPS Take 1 capsule (2,000 Units total) by mouth daily. 12/21/17   Nicholas Lose, MD  divalproex (DEPAKOTE ER) 500 MG 24 hr tablet  09/28/19   [provider]  latanoprost (XALATAN) 0.005 % ophthalmic solution Place 1 drop into both eyes at bedtime.    [provider]  losartan (COZAAR) 100 MG tablet Take 100 mg by mouth daily.      [provider]   memantine (NAMENDA) 5 MG tablet Take 5 mg by mouth 2 (two) times daily.    [provider]  Multiple Vitamins-Minerals (MULTIVITAMIN WITH MINERALS) tablet Take 1 tablet by mouth daily.      [provider]  OLANZapine (ZYPREXA) 5 MG tablet Take 5 mg by mouth at bedtime. One in the am and three at bedtime.    [provider]  pioglitazone (ACTOS) 30 MG tablet Take 15 mg by mouth daily.     [provider]  RA COL-RITE 100 MG capsule Take 100 mg by mouth daily.  10/28/14   [provider]  simvastatin (ZOCOR) 10 MG tablet Take 10 mg by mouth daily.    [provider]      Allergies    Patient has no known allergies.    Review of Systems   Review of Systems  Unable to perform ROS: Dementia  Respiratory:  Positive for cough.     Physical Exam Updated Vital Signs BP (!) 133/54   Pulse 83   Temp 99.1 F (37.3 C) (Oral)   Resp 18   Ht '5\' 3"'$  (1.6 m)   Wt 79 kg   SpO2 92%   BMI 30.85 kg/m  Physical Exam Vitals and nursing note reviewed.  Constitutional:      Appearance: Normal appearance.  HENT:  Head: Normocephalic and atraumatic.  Eyes:     Conjunctiva/sclera: Conjunctivae normal.  Cardiovascular:     Rate and Rhythm: Normal rate and regular rhythm.  Pulmonary:     Effort: Pulmonary effort is normal. No respiratory distress.     Breath sounds: Wheezing and rhonchi present.  Abdominal:     General: There is no distension.     Palpations: Abdomen is soft.     Tenderness: There is no abdominal tenderness.  Skin:    General: Skin is warm and dry.  Neurological:     General: No focal deficit present.     Mental Status: She is alert.     ED Results / Procedures / Treatments   Labs (all labs ordered are listed, but only abnormal results are displayed) Labs Reviewed  RESP PANEL BY RT-PCR (RSV, FLU A&B, COVID)  RVPGX2 - Abnormal; Notable for the following components:      Result Value   Influenza A by PCR POSITIVE  (*)    All other components within normal limits  COMPREHENSIVE METABOLIC PANEL - Abnormal; Notable for the following components:   Glucose, Bld 249 (*)    BUN 27 (*)    Creatinine, Ser 1.02 (*)    Albumin 3.3 (*)    GFR, Estimated 57 (*)    All other components within normal limits  URINALYSIS, ROUTINE W REFLEX MICROSCOPIC - Abnormal; Notable for the following components:   Glucose, UA 50 (*)    Ketones, ur 5 (*)    All other components within normal limits  CBC    EKG None  Radiology DG Chest Portable 1 View  Result Date: 04/05/2022 CLINICAL DATA:  Cough, exposure to COVID EXAM: PORTABLE CHEST 1 VIEW COMPARISON:  09/05/2020 FINDINGS: Transverse diameter of heart is slightly increased. There are no signs of pulmonary edema or focal pulmonary consolidation. There is no pleural effusion or pneumothorax. There is evidence of left mastectomy. Surgical clips are seen in the left chest wall. Degenerative changes are noted in the left shoulder. IMPRESSION: No active disease. Electronically Signed   By: Elmer Picker M.D.   On: 04/05/2022 14:45    Procedures Procedures    Medications Ordered in ED Medications  albuterol (VENTOLIN HFA) 108 (90 Base) MCG/ACT inhaler 1 puff (has no administration in time range)  oseltamivir (TAMIFLU) capsule 75 mg (has no administration in time range)  ipratropium-albuterol (DUONEB) 0.5-2.5 (3) MG/3ML nebulizer solution 3 mL (3 mLs Nebulization Given 04/05/22 1846)  ipratropium-albuterol (DUONEB) 0.5-2.5 (3) MG/3ML nebulizer solution 3 mL (3 mLs Nebulization Given 04/05/22 2020)    ED Course/ Medical Decision Making/ A&P                           Medical Decision Making Amount and/or Complexity of Data Reviewed Labs: ordered. Radiology: ordered.  Risk Prescription drug management. Decision regarding hospitalization.  This patient is a 76 y.o. female  who presents to the ED for concern of cough and lethargy as reported by facility.    Differential diagnoses prior to evaluation: The emergent differential diagnosis includes, but is not limited to,  Upper respiratory infection, lower respiratory infection, allergies, asthma, irritants, foreign body, medications (ACE inhibitors), reflux, CHF, lung cancer, interstitial lung disease, psychiatric causes, postnasal drip. This is not an exhaustive differential.   Past Medical History / Co-morbidities: diabetes, HTN, HLD, schizophrenia, and dementia  Physical Exam: Physical exam performed. The pertinent findings include: Normal vital signs, afebrile.  Normal  oxygen saturation on room air.  Diffuse wheezing and rhonchi in all lung fields, wheezing improved after breathing treatment.  Lab Tests/Imaging studies: I personally interpreted labs/imaging and the pertinent results include: No leukocytosis, normal hemoglobin.  CMP at baseline.  Urinalysis negative for hematuria or infection.  Respiratory panel positive for influenza A.    Chest x-ray without acute cardiopulmonary abnormalities.  I agree with the radiologist interpretation.  Medications: I ordered medication including DuoNebs.  I have reviewed the patients home medicines and have made adjustments as needed.  Consultations obtained: I consulted with hospitalist Dr Roselyn Reef about the patient's presentation and thought she would benefit from hospitalization for further treatment of her symptoms.  Through shared decision-making, we felt patient could be adequately treated at her facility with antivirals and albuterol inhaler.    Disposition: After consideration of the diagnostic results and the patients response to treatment, I feel that emergency department workup does not suggest an emergent condition requiring admission or immediate intervention beyond what has been performed at this time. The plan is: Discharge home to facility with prescription for Tamiflu and albuterol inhaler.  Not septic, low concern for bacterial process as  CXR is unremarkable and no consolidation present on pulmonary auscultation. Patient shown how to use the inhaler, but of also given instructions to the facility.  Case was discussed with caretaker at facility. The patient is safe for discharge and has been instructed to return immediately for worsening symptoms, change in symptoms or any other concerns.   I discussed this case with my attending physician Dr. Francia Greaves who cosigned this note including patient's presenting symptoms, physical exam, and planned diagnostics and interventions. Attending physician stated agreement with plan or made changes to plan which were implemented.   Final Clinical Impression(s) / ED Diagnoses Final diagnoses:  Bronchospasm  Influenza A    Rx / DC Orders ED Discharge Orders          Ordered    oseltamivir (TAMIFLU) 75 MG capsule  Every 12 hours        04/05/22 2142           Portions of this report may have been transcribed using voice recognition software. Every effort was made to ensure accuracy; however, inadvertent computerized transcription errors may be present.    Estill Cotta 04/05/22 2147    Valarie Merino, MD 04/05/22 2151

## 2022-04-05 NOTE — ED Provider Triage Note (Addendum)
Emergency Medicine Provider Triage Evaluation Note  Alexandra Lamb , a 76 y.o. female  was evaluated in triage.  Pt coming in from [facility] facility where apparently they are experiencing a covid outbreak. Pt has been more tired and coughing for the past 3-4 days, incontinent of urine. At her baseline mental status. Pt complaining of left leg pain. Hx of dementia and schizophrenia. CBG 293 with EMS  Review of Systems  Positive: Coughing, urinary incontinence, fatigue Negative:   Physical Exam  BP (!) 160/80 (BP Location: Left Arm)   Pulse 80   Temp 98 F (36.7 C) (Oral)   Resp 18   SpO2 95%  Gen:   Awake, no distress   Resp:  Normal effort  MSK:   Moves extremities without difficulty  Other:  A&O x 3  Medical Decision Making  Medically screening exam initiated at 2:35 PM.  Appropriate orders placed.  Alexandra Lamb was informed that the remainder of the evaluation will be completed by another provider, this initial triage assessment does not replace that evaluation, and the importance of remaining in the ED until their evaluation is complete.  Workup initiated    Kateri Plummer, PA-C 04/05/22 1435

## 2022-04-05 NOTE — Discharge Instructions (Addendum)
Alexandra Lamb was seen in the emergency department for fatigue and cough.  As I had discussed with one of the care takers on the phone, she was diagnosed with the flu. I think this is likely what was making her so fatigued. I discussed her case with the hospital doctor after giving her some breathing treatments, and we think we can manage her symptoms at home rather than requiring hospitalization.  I've prescribed the anti-viral medication Tamiflu, which she'll take twice daily for 5 days. I've also written a prescription for an albuterol inhaler than she can use 1-2 puffs every 4-6 hours as needed for shortness of breath.  Continue to monitor how she's doing and bring her back to the ER for any new or worsening symptoms.

## 2022-04-05 NOTE — Consult Note (Signed)
Initial Consultation Note   Patient: Alexandra Lamb HYI:[id number] DOB: 03-17-1946 PCP: Harlan Stains, MD DOA: 04/05/2022 DOS: the patient was seen and examined on 04/05/2022 Primary service: Valarie Merino, MD  Referring physician: Dene Gentry Reason for consult: Influenza A  Assessment/Plan: Assessment and Plan: No notes have been filed under this hospital service. Service: Hospitalist  Alexandra Lamb is a 76 yo w/PMHx of HTN and Dementia who presented to ED for symptomatic Influenza A infection.  #Influenza A infection Overall, the patient is hemodynamically stable and on room air. She is well appearing not tachypneic and not in distress. On my evaluation, she has maintained sats consistently >94% with appropriate waveform and her symptoms have improved. Given her history of paranoid schizophrenia, dementia and advanced age, it would be reasonable to avoid hospital admission and disorienting the patient if possible. - Discharge med rec: albuterol inhaler q6h until improvement - Tamiflu '75mg'$  BID x 5 days - Patient to follow up with PCP within 1-2 weeks to reassess symptoms - Appropriate for discharge from ED with close PCP f/u  #Hyperglycemia - Likely related to stress of acute illness,   Chronic conditions HTN/DM - c/w home regimen  HPI: Alexandra Lamb is a 76 y.o. female with past medical history of paranoid schizophrenia, dementia, HTN who presents for further evaluation of lethargy.  Per report, the patient is residing in a facility that has had a COVID outbreak and this morning was apparently more fatigued than expected.  She has associated cough for the past 3 days but denies shortness of breath, myalgia, or fever.  She reports that her symptoms have improved compared to onset. In the ED, the patient was afebrile, hemodynamically stable and on room air.  Lab work was overall benign except for respiratory panel being positive for influenza A and some  hyperglycemia.   Review of Systems: As mentioned in the history of present illness. All other systems reviewed and are negative. Past Medical History:  Diagnosis Date   Cancer ([place])    left breast   Diabetes mellitus    Hyperlipidemia    Hypertension    MCI (mild cognitive impairment)    Schizophrenia ([place])    paranoid   Past Surgical History:  Procedure Laterality Date   BREAST SURGERY  2011   left breast mast   MASTECTOMY     left   TOOTH EXTRACTION     Social History:  reports that she quit smoking about 14 years ago. She has never used smokeless tobacco. She reports that she does not drink alcohol and does not use drugs.  No Known Allergies  Family History  Problem Relation Age of Onset   Cancer Maternal Aunt        brain tumor   Dementia Neg Hx     Prior to Admission medications   Medication Sig Start Date End Date Taking? Authorizing Provider  oseltamivir (TAMIFLU) 75 MG capsule Take 1 capsule (75 mg total) by mouth every 12 (twelve) hours. 04/05/22  Yes Roemhildt, Lorin T, PA-C  acetaminophen (TYLENOL) 500 MG tablet Take 500 mg by mouth every 6 (six) hours as needed.      [provider]  amLODipine (NORVASC) 5 MG tablet Take 5 mg by mouth daily.      [provider]  atenolol (TENORMIN) 50 MG tablet Take 25 mg by mouth daily.     [provider]  calcium carbonate (TUMS - DOSED IN MG ELEMENTAL CALCIUM) 500 MG chewable tablet Chew  1 tablet by mouth as needed.     [provider]  calcium-vitamin D (OSCAL WITH D) 500-200 MG-UNIT tablet Take 1 tablet by mouth daily. 12/22/16   Nicholas Lose, MD  Cholecalciferol (RA VITAMIN D-3) 2000 units CAPS Take 1 capsule (2,000 Units total) by mouth daily. 12/21/17   Nicholas Lose, MD  divalproex (DEPAKOTE ER) 500 MG 24 hr tablet  09/28/19   [provider]  latanoprost (XALATAN) 0.005 % ophthalmic solution Place 1 drop into both eyes at bedtime.    [provider]  losartan  (COZAAR) 100 MG tablet Take 100 mg by mouth daily.      [provider]  memantine (NAMENDA) 5 MG tablet Take 5 mg by mouth 2 (two) times daily.    [provider]  Multiple Vitamins-Minerals (MULTIVITAMIN WITH MINERALS) tablet Take 1 tablet by mouth daily.      [provider]  OLANZapine (ZYPREXA) 5 MG tablet Take 5 mg by mouth at bedtime. One in the am and three at bedtime.    [provider]  pioglitazone (ACTOS) 30 MG tablet Take 15 mg by mouth daily.     [provider]  RA COL-RITE 100 MG capsule Take 100 mg by mouth daily.  10/28/14   [provider]  simvastatin (ZOCOR) 10 MG tablet Take 10 mg by mouth daily.    [provider]    Physical Exam: Vitals:   04/05/22 2045 04/05/22 2100 04/05/22 2115 04/05/22 2127  BP: 97/67 (!) 133/54    Pulse: 76 76 83   Resp:  18    Temp:   99.1 F (37.3 C)   TempSrc:   Oral   SpO2: 95% (!) 89% 92%   Weight:    79 kg  Height:    '5\' 3"'$  (1.6 m)   Physical Exam HENT:     Head: Normocephalic.  Eyes:     Extraocular Movements: Extraocular movements intact.  Cardiovascular:     Rate and Rhythm: Normal rate and regular rhythm.     Heart sounds: No murmur heard. Pulmonary:     Effort: Pulmonary effort is normal. No respiratory distress.     Breath sounds: Wheezing present.  Chest:     Chest wall: No tenderness.  Abdominal:     General: Abdomen is flat. There is no distension.     Palpations: Abdomen is soft.     Tenderness: There is no abdominal tenderness.  Musculoskeletal:        General: No swelling.  Skin:    General: Skin is warm and dry.  Neurological:     Mental Status: She is alert. Mental status is at baseline.     Comments: Oriented to self and place, reports the year is 1923 initially and does not recall president.    Data Reviewed:   CXR with some haziness of the R costophrenic angle but otherwise no obvious opacification    Family Communication: Per  ED Primary team communication: Discussed with PA Lorin   Thank you very much for involving [place] in the care of your patient.  Author: Cecille Rubin, MD 04/05/2022 9:42 PM  For on call review [email or web address].

## 2022-04-06 DIAGNOSIS — R5383 Other fatigue: Secondary | ICD-10-CM | POA: Diagnosis not present

## 2022-04-06 DIAGNOSIS — M6281 Muscle weakness (generalized): Secondary | ICD-10-CM | POA: Diagnosis not present

## 2022-04-06 DIAGNOSIS — R4182 Altered mental status, unspecified: Secondary | ICD-10-CM | POA: Diagnosis not present

## 2022-04-06 DIAGNOSIS — R2689 Other abnormalities of gait and mobility: Secondary | ICD-10-CM | POA: Diagnosis not present

## 2022-04-06 DIAGNOSIS — N3946 Mixed incontinence: Secondary | ICD-10-CM | POA: Diagnosis not present

## 2022-04-06 DIAGNOSIS — Z7401 Bed confinement status: Secondary | ICD-10-CM | POA: Diagnosis not present

## 2022-04-06 DIAGNOSIS — R279 Unspecified lack of coordination: Secondary | ICD-10-CM | POA: Diagnosis not present

## 2022-04-08 DIAGNOSIS — N3946 Mixed incontinence: Secondary | ICD-10-CM | POA: Diagnosis not present

## 2022-04-08 DIAGNOSIS — R279 Unspecified lack of coordination: Secondary | ICD-10-CM | POA: Diagnosis not present

## 2022-04-08 DIAGNOSIS — R2689 Other abnormalities of gait and mobility: Secondary | ICD-10-CM | POA: Diagnosis not present

## 2022-04-08 DIAGNOSIS — M6281 Muscle weakness (generalized): Secondary | ICD-10-CM | POA: Diagnosis not present

## 2022-04-09 DIAGNOSIS — M6281 Muscle weakness (generalized): Secondary | ICD-10-CM | POA: Diagnosis not present

## 2022-04-09 DIAGNOSIS — N3946 Mixed incontinence: Secondary | ICD-10-CM | POA: Diagnosis not present

## 2022-04-09 DIAGNOSIS — R2689 Other abnormalities of gait and mobility: Secondary | ICD-10-CM | POA: Diagnosis not present

## 2022-04-09 DIAGNOSIS — R279 Unspecified lack of coordination: Secondary | ICD-10-CM | POA: Diagnosis not present

## 2022-04-10 DIAGNOSIS — R2689 Other abnormalities of gait and mobility: Secondary | ICD-10-CM | POA: Diagnosis not present

## 2022-04-10 DIAGNOSIS — N3946 Mixed incontinence: Secondary | ICD-10-CM | POA: Diagnosis not present

## 2022-04-10 DIAGNOSIS — R279 Unspecified lack of coordination: Secondary | ICD-10-CM | POA: Diagnosis not present

## 2022-04-10 DIAGNOSIS — M6281 Muscle weakness (generalized): Secondary | ICD-10-CM | POA: Diagnosis not present

## 2022-04-14 DIAGNOSIS — M6281 Muscle weakness (generalized): Secondary | ICD-10-CM | POA: Diagnosis not present

## 2022-04-14 DIAGNOSIS — R279 Unspecified lack of coordination: Secondary | ICD-10-CM | POA: Diagnosis not present

## 2022-04-14 DIAGNOSIS — N3946 Mixed incontinence: Secondary | ICD-10-CM | POA: Diagnosis not present

## 2022-04-14 DIAGNOSIS — R2689 Other abnormalities of gait and mobility: Secondary | ICD-10-CM | POA: Diagnosis not present

## 2022-04-15 DIAGNOSIS — R279 Unspecified lack of coordination: Secondary | ICD-10-CM | POA: Diagnosis not present

## 2022-04-15 DIAGNOSIS — M6281 Muscle weakness (generalized): Secondary | ICD-10-CM | POA: Diagnosis not present

## 2022-04-15 DIAGNOSIS — R2689 Other abnormalities of gait and mobility: Secondary | ICD-10-CM | POA: Diagnosis not present

## 2022-04-15 DIAGNOSIS — N3946 Mixed incontinence: Secondary | ICD-10-CM | POA: Diagnosis not present

## 2022-04-16 DIAGNOSIS — R279 Unspecified lack of coordination: Secondary | ICD-10-CM | POA: Diagnosis not present

## 2022-04-16 DIAGNOSIS — M6281 Muscle weakness (generalized): Secondary | ICD-10-CM | POA: Diagnosis not present

## 2022-04-16 DIAGNOSIS — R2689 Other abnormalities of gait and mobility: Secondary | ICD-10-CM | POA: Diagnosis not present

## 2022-04-16 DIAGNOSIS — N3946 Mixed incontinence: Secondary | ICD-10-CM | POA: Diagnosis not present

## 2022-04-17 DIAGNOSIS — N3946 Mixed incontinence: Secondary | ICD-10-CM | POA: Diagnosis not present

## 2022-04-17 DIAGNOSIS — M6281 Muscle weakness (generalized): Secondary | ICD-10-CM | POA: Diagnosis not present

## 2022-04-17 DIAGNOSIS — R2689 Other abnormalities of gait and mobility: Secondary | ICD-10-CM | POA: Diagnosis not present

## 2022-04-17 DIAGNOSIS — R279 Unspecified lack of coordination: Secondary | ICD-10-CM | POA: Diagnosis not present

## 2022-04-20 DIAGNOSIS — M6281 Muscle weakness (generalized): Secondary | ICD-10-CM | POA: Diagnosis not present

## 2022-04-20 DIAGNOSIS — N3946 Mixed incontinence: Secondary | ICD-10-CM | POA: Diagnosis not present

## 2022-04-20 DIAGNOSIS — R2689 Other abnormalities of gait and mobility: Secondary | ICD-10-CM | POA: Diagnosis not present

## 2022-04-20 DIAGNOSIS — R279 Unspecified lack of coordination: Secondary | ICD-10-CM | POA: Diagnosis not present

## 2022-04-21 DIAGNOSIS — N3946 Mixed incontinence: Secondary | ICD-10-CM | POA: Diagnosis not present

## 2022-04-21 DIAGNOSIS — R279 Unspecified lack of coordination: Secondary | ICD-10-CM | POA: Diagnosis not present

## 2022-04-21 DIAGNOSIS — M6281 Muscle weakness (generalized): Secondary | ICD-10-CM | POA: Diagnosis not present

## 2022-04-21 DIAGNOSIS — R2689 Other abnormalities of gait and mobility: Secondary | ICD-10-CM | POA: Diagnosis not present

## 2022-04-22 ENCOUNTER — Other Ambulatory Visit: Payer: Self-pay | Admitting: *Deleted

## 2022-04-22 DIAGNOSIS — Z87891 Personal history of nicotine dependence: Secondary | ICD-10-CM

## 2022-04-22 DIAGNOSIS — R279 Unspecified lack of coordination: Secondary | ICD-10-CM | POA: Diagnosis not present

## 2022-04-22 DIAGNOSIS — N3946 Mixed incontinence: Secondary | ICD-10-CM | POA: Diagnosis not present

## 2022-04-22 DIAGNOSIS — Z122 Encounter for screening for malignant neoplasm of respiratory organs: Secondary | ICD-10-CM

## 2022-04-22 DIAGNOSIS — M6281 Muscle weakness (generalized): Secondary | ICD-10-CM | POA: Diagnosis not present

## 2022-04-22 DIAGNOSIS — R2689 Other abnormalities of gait and mobility: Secondary | ICD-10-CM | POA: Diagnosis not present

## 2022-04-27 DIAGNOSIS — N3946 Mixed incontinence: Secondary | ICD-10-CM | POA: Diagnosis not present

## 2022-04-27 DIAGNOSIS — R279 Unspecified lack of coordination: Secondary | ICD-10-CM | POA: Diagnosis not present

## 2022-04-27 DIAGNOSIS — M6281 Muscle weakness (generalized): Secondary | ICD-10-CM | POA: Diagnosis not present

## 2022-04-27 DIAGNOSIS — R2689 Other abnormalities of gait and mobility: Secondary | ICD-10-CM | POA: Diagnosis not present

## 2022-04-28 DIAGNOSIS — M6281 Muscle weakness (generalized): Secondary | ICD-10-CM | POA: Diagnosis not present

## 2022-04-28 DIAGNOSIS — R279 Unspecified lack of coordination: Secondary | ICD-10-CM | POA: Diagnosis not present

## 2022-04-28 DIAGNOSIS — R2689 Other abnormalities of gait and mobility: Secondary | ICD-10-CM | POA: Diagnosis not present

## 2022-04-28 DIAGNOSIS — N3946 Mixed incontinence: Secondary | ICD-10-CM | POA: Diagnosis not present

## 2022-04-29 DIAGNOSIS — R279 Unspecified lack of coordination: Secondary | ICD-10-CM | POA: Diagnosis not present

## 2022-04-29 DIAGNOSIS — M6281 Muscle weakness (generalized): Secondary | ICD-10-CM | POA: Diagnosis not present

## 2022-04-29 DIAGNOSIS — R2689 Other abnormalities of gait and mobility: Secondary | ICD-10-CM | POA: Diagnosis not present

## 2022-04-29 DIAGNOSIS — N3946 Mixed incontinence: Secondary | ICD-10-CM | POA: Diagnosis not present

## 2022-04-30 DIAGNOSIS — R279 Unspecified lack of coordination: Secondary | ICD-10-CM | POA: Diagnosis not present

## 2022-04-30 DIAGNOSIS — R2689 Other abnormalities of gait and mobility: Secondary | ICD-10-CM | POA: Diagnosis not present

## 2022-04-30 DIAGNOSIS — N3946 Mixed incontinence: Secondary | ICD-10-CM | POA: Diagnosis not present

## 2022-04-30 DIAGNOSIS — M6281 Muscle weakness (generalized): Secondary | ICD-10-CM | POA: Diagnosis not present

## 2022-05-01 DIAGNOSIS — M6281 Muscle weakness (generalized): Secondary | ICD-10-CM | POA: Diagnosis not present

## 2022-05-01 DIAGNOSIS — R279 Unspecified lack of coordination: Secondary | ICD-10-CM | POA: Diagnosis not present

## 2022-05-01 DIAGNOSIS — N3946 Mixed incontinence: Secondary | ICD-10-CM | POA: Diagnosis not present

## 2022-05-01 DIAGNOSIS — R2689 Other abnormalities of gait and mobility: Secondary | ICD-10-CM | POA: Diagnosis not present

## 2022-05-04 DIAGNOSIS — M6281 Muscle weakness (generalized): Secondary | ICD-10-CM | POA: Diagnosis not present

## 2022-05-04 DIAGNOSIS — R279 Unspecified lack of coordination: Secondary | ICD-10-CM | POA: Diagnosis not present

## 2022-05-04 DIAGNOSIS — R2689 Other abnormalities of gait and mobility: Secondary | ICD-10-CM | POA: Diagnosis not present

## 2022-05-04 DIAGNOSIS — N3946 Mixed incontinence: Secondary | ICD-10-CM | POA: Diagnosis not present

## 2022-05-05 DIAGNOSIS — M6281 Muscle weakness (generalized): Secondary | ICD-10-CM | POA: Diagnosis not present

## 2022-05-05 DIAGNOSIS — R2689 Other abnormalities of gait and mobility: Secondary | ICD-10-CM | POA: Diagnosis not present

## 2022-05-05 DIAGNOSIS — N3946 Mixed incontinence: Secondary | ICD-10-CM | POA: Diagnosis not present

## 2022-05-05 DIAGNOSIS — R279 Unspecified lack of coordination: Secondary | ICD-10-CM | POA: Diagnosis not present

## 2022-05-07 DIAGNOSIS — R2689 Other abnormalities of gait and mobility: Secondary | ICD-10-CM | POA: Diagnosis not present

## 2022-05-07 DIAGNOSIS — R279 Unspecified lack of coordination: Secondary | ICD-10-CM | POA: Diagnosis not present

## 2022-05-07 DIAGNOSIS — M6281 Muscle weakness (generalized): Secondary | ICD-10-CM | POA: Diagnosis not present

## 2022-05-07 DIAGNOSIS — N3946 Mixed incontinence: Secondary | ICD-10-CM | POA: Diagnosis not present

## 2022-05-08 DIAGNOSIS — M6281 Muscle weakness (generalized): Secondary | ICD-10-CM | POA: Diagnosis not present

## 2022-05-08 DIAGNOSIS — R279 Unspecified lack of coordination: Secondary | ICD-10-CM | POA: Diagnosis not present

## 2022-05-08 DIAGNOSIS — R2689 Other abnormalities of gait and mobility: Secondary | ICD-10-CM | POA: Diagnosis not present

## 2022-05-08 DIAGNOSIS — N3946 Mixed incontinence: Secondary | ICD-10-CM | POA: Diagnosis not present

## 2022-05-09 DIAGNOSIS — R2689 Other abnormalities of gait and mobility: Secondary | ICD-10-CM | POA: Diagnosis not present

## 2022-05-09 DIAGNOSIS — R279 Unspecified lack of coordination: Secondary | ICD-10-CM | POA: Diagnosis not present

## 2022-05-09 DIAGNOSIS — M6281 Muscle weakness (generalized): Secondary | ICD-10-CM | POA: Diagnosis not present

## 2022-05-09 DIAGNOSIS — N3946 Mixed incontinence: Secondary | ICD-10-CM | POA: Diagnosis not present

## 2022-05-11 DIAGNOSIS — N3946 Mixed incontinence: Secondary | ICD-10-CM | POA: Diagnosis not present

## 2022-05-11 DIAGNOSIS — R2689 Other abnormalities of gait and mobility: Secondary | ICD-10-CM | POA: Diagnosis not present

## 2022-05-11 DIAGNOSIS — M6281 Muscle weakness (generalized): Secondary | ICD-10-CM | POA: Diagnosis not present

## 2022-05-11 DIAGNOSIS — R279 Unspecified lack of coordination: Secondary | ICD-10-CM | POA: Diagnosis not present

## 2022-05-12 DIAGNOSIS — R2689 Other abnormalities of gait and mobility: Secondary | ICD-10-CM | POA: Diagnosis not present

## 2022-05-12 DIAGNOSIS — N3946 Mixed incontinence: Secondary | ICD-10-CM | POA: Diagnosis not present

## 2022-05-12 DIAGNOSIS — M6281 Muscle weakness (generalized): Secondary | ICD-10-CM | POA: Diagnosis not present

## 2022-05-12 DIAGNOSIS — R279 Unspecified lack of coordination: Secondary | ICD-10-CM | POA: Diagnosis not present

## 2022-05-14 DIAGNOSIS — R279 Unspecified lack of coordination: Secondary | ICD-10-CM | POA: Diagnosis not present

## 2022-05-14 DIAGNOSIS — R2689 Other abnormalities of gait and mobility: Secondary | ICD-10-CM | POA: Diagnosis not present

## 2022-05-14 DIAGNOSIS — M6281 Muscle weakness (generalized): Secondary | ICD-10-CM | POA: Diagnosis not present

## 2022-05-14 DIAGNOSIS — N3946 Mixed incontinence: Secondary | ICD-10-CM | POA: Diagnosis not present

## 2022-05-15 DIAGNOSIS — R2689 Other abnormalities of gait and mobility: Secondary | ICD-10-CM | POA: Diagnosis not present

## 2022-05-15 DIAGNOSIS — M6281 Muscle weakness (generalized): Secondary | ICD-10-CM | POA: Diagnosis not present

## 2022-05-15 DIAGNOSIS — R279 Unspecified lack of coordination: Secondary | ICD-10-CM | POA: Diagnosis not present

## 2022-05-15 DIAGNOSIS — N3946 Mixed incontinence: Secondary | ICD-10-CM | POA: Diagnosis not present

## 2022-05-18 DIAGNOSIS — M6281 Muscle weakness (generalized): Secondary | ICD-10-CM | POA: Diagnosis not present

## 2022-05-18 DIAGNOSIS — R2689 Other abnormalities of gait and mobility: Secondary | ICD-10-CM | POA: Diagnosis not present

## 2022-05-18 DIAGNOSIS — R279 Unspecified lack of coordination: Secondary | ICD-10-CM | POA: Diagnosis not present

## 2022-05-18 DIAGNOSIS — N3946 Mixed incontinence: Secondary | ICD-10-CM | POA: Diagnosis not present

## 2022-05-19 DIAGNOSIS — M6281 Muscle weakness (generalized): Secondary | ICD-10-CM | POA: Diagnosis not present

## 2022-05-19 DIAGNOSIS — N3946 Mixed incontinence: Secondary | ICD-10-CM | POA: Diagnosis not present

## 2022-05-19 DIAGNOSIS — R2689 Other abnormalities of gait and mobility: Secondary | ICD-10-CM | POA: Diagnosis not present

## 2022-05-19 DIAGNOSIS — R279 Unspecified lack of coordination: Secondary | ICD-10-CM | POA: Diagnosis not present

## 2022-05-20 DIAGNOSIS — R2689 Other abnormalities of gait and mobility: Secondary | ICD-10-CM | POA: Diagnosis not present

## 2022-05-20 DIAGNOSIS — M6281 Muscle weakness (generalized): Secondary | ICD-10-CM | POA: Diagnosis not present

## 2022-05-20 DIAGNOSIS — R279 Unspecified lack of coordination: Secondary | ICD-10-CM | POA: Diagnosis not present

## 2022-05-20 DIAGNOSIS — N3946 Mixed incontinence: Secondary | ICD-10-CM | POA: Diagnosis not present

## 2022-05-21 DIAGNOSIS — R2689 Other abnormalities of gait and mobility: Secondary | ICD-10-CM | POA: Diagnosis not present

## 2022-05-21 DIAGNOSIS — N3946 Mixed incontinence: Secondary | ICD-10-CM | POA: Diagnosis not present

## 2022-05-21 DIAGNOSIS — R279 Unspecified lack of coordination: Secondary | ICD-10-CM | POA: Diagnosis not present

## 2022-05-21 DIAGNOSIS — M6281 Muscle weakness (generalized): Secondary | ICD-10-CM | POA: Diagnosis not present

## 2022-05-23 DIAGNOSIS — G3184 Mild cognitive impairment, so stated: Secondary | ICD-10-CM | POA: Diagnosis not present

## 2022-05-23 DIAGNOSIS — F200 Paranoid schizophrenia: Secondary | ICD-10-CM | POA: Diagnosis not present

## 2022-05-25 DIAGNOSIS — N3946 Mixed incontinence: Secondary | ICD-10-CM | POA: Diagnosis not present

## 2022-05-25 DIAGNOSIS — M6281 Muscle weakness (generalized): Secondary | ICD-10-CM | POA: Diagnosis not present

## 2022-05-25 DIAGNOSIS — R2689 Other abnormalities of gait and mobility: Secondary | ICD-10-CM | POA: Diagnosis not present

## 2022-05-25 DIAGNOSIS — R279 Unspecified lack of coordination: Secondary | ICD-10-CM | POA: Diagnosis not present

## 2022-05-26 DIAGNOSIS — R279 Unspecified lack of coordination: Secondary | ICD-10-CM | POA: Diagnosis not present

## 2022-05-26 DIAGNOSIS — N3946 Mixed incontinence: Secondary | ICD-10-CM | POA: Diagnosis not present

## 2022-05-26 DIAGNOSIS — R2689 Other abnormalities of gait and mobility: Secondary | ICD-10-CM | POA: Diagnosis not present

## 2022-05-26 DIAGNOSIS — M6281 Muscle weakness (generalized): Secondary | ICD-10-CM | POA: Diagnosis not present

## 2022-05-27 DIAGNOSIS — R2689 Other abnormalities of gait and mobility: Secondary | ICD-10-CM | POA: Diagnosis not present

## 2022-05-27 DIAGNOSIS — M6281 Muscle weakness (generalized): Secondary | ICD-10-CM | POA: Diagnosis not present

## 2022-05-27 DIAGNOSIS — N3946 Mixed incontinence: Secondary | ICD-10-CM | POA: Diagnosis not present

## 2022-05-27 DIAGNOSIS — R279 Unspecified lack of coordination: Secondary | ICD-10-CM | POA: Diagnosis not present

## 2022-05-30 DIAGNOSIS — R279 Unspecified lack of coordination: Secondary | ICD-10-CM | POA: Diagnosis not present

## 2022-05-30 DIAGNOSIS — M6281 Muscle weakness (generalized): Secondary | ICD-10-CM | POA: Diagnosis not present

## 2022-05-30 DIAGNOSIS — N3946 Mixed incontinence: Secondary | ICD-10-CM | POA: Diagnosis not present

## 2022-05-30 DIAGNOSIS — R2689 Other abnormalities of gait and mobility: Secondary | ICD-10-CM | POA: Diagnosis not present

## 2022-06-01 DIAGNOSIS — R279 Unspecified lack of coordination: Secondary | ICD-10-CM | POA: Diagnosis not present

## 2022-06-01 DIAGNOSIS — R2689 Other abnormalities of gait and mobility: Secondary | ICD-10-CM | POA: Diagnosis not present

## 2022-06-01 DIAGNOSIS — N3946 Mixed incontinence: Secondary | ICD-10-CM | POA: Diagnosis not present

## 2022-06-01 DIAGNOSIS — M6281 Muscle weakness (generalized): Secondary | ICD-10-CM | POA: Diagnosis not present

## 2022-06-03 ENCOUNTER — Ambulatory Visit: Admission: RE | Admit: 2022-06-03 | Payer: Medicare Other | Source: Ambulatory Visit

## 2022-06-03 ENCOUNTER — Telehealth: Payer: Self-pay | Admitting: Acute Care

## 2022-06-03 ENCOUNTER — Encounter: Payer: Medicare Other | Admitting: Acute Care

## 2022-06-03 DIAGNOSIS — M6281 Muscle weakness (generalized): Secondary | ICD-10-CM | POA: Diagnosis not present

## 2022-06-03 DIAGNOSIS — R2689 Other abnormalities of gait and mobility: Secondary | ICD-10-CM | POA: Diagnosis not present

## 2022-06-03 DIAGNOSIS — N3946 Mixed incontinence: Secondary | ICD-10-CM | POA: Diagnosis not present

## 2022-06-03 DIAGNOSIS — R279 Unspecified lack of coordination: Secondary | ICD-10-CM | POA: Diagnosis not present

## 2022-06-03 NOTE — Telephone Encounter (Signed)
Sdmv appt and LDCT cancelled for today - placed paperwork in folder for rescheduling

## 2022-06-03 NOTE — Telephone Encounter (Signed)
This patient was a no show for her scheduled shared decision telephone visit. Her brother in law said she was at ana appointment with Dr. Harlan Stains.  We will need to cancer her CT Chest scheduled for 06/04/2022 at 11 am and reschedule the shared decision making visit.  Thanks so much

## 2022-06-04 DIAGNOSIS — R2689 Other abnormalities of gait and mobility: Secondary | ICD-10-CM | POA: Diagnosis not present

## 2022-06-04 DIAGNOSIS — R279 Unspecified lack of coordination: Secondary | ICD-10-CM | POA: Diagnosis not present

## 2022-06-04 DIAGNOSIS — M6281 Muscle weakness (generalized): Secondary | ICD-10-CM | POA: Diagnosis not present

## 2022-06-04 DIAGNOSIS — N3946 Mixed incontinence: Secondary | ICD-10-CM | POA: Diagnosis not present

## 2022-06-06 DIAGNOSIS — R2689 Other abnormalities of gait and mobility: Secondary | ICD-10-CM | POA: Diagnosis not present

## 2022-06-06 DIAGNOSIS — R279 Unspecified lack of coordination: Secondary | ICD-10-CM | POA: Diagnosis not present

## 2022-06-06 DIAGNOSIS — N3946 Mixed incontinence: Secondary | ICD-10-CM | POA: Diagnosis not present

## 2022-06-06 DIAGNOSIS — M6281 Muscle weakness (generalized): Secondary | ICD-10-CM | POA: Diagnosis not present

## 2022-06-08 DIAGNOSIS — R2689 Other abnormalities of gait and mobility: Secondary | ICD-10-CM | POA: Diagnosis not present

## 2022-06-08 DIAGNOSIS — M6281 Muscle weakness (generalized): Secondary | ICD-10-CM | POA: Diagnosis not present

## 2022-06-08 DIAGNOSIS — R279 Unspecified lack of coordination: Secondary | ICD-10-CM | POA: Diagnosis not present

## 2022-06-08 DIAGNOSIS — N3946 Mixed incontinence: Secondary | ICD-10-CM | POA: Diagnosis not present

## 2022-06-09 DIAGNOSIS — N3946 Mixed incontinence: Secondary | ICD-10-CM | POA: Diagnosis not present

## 2022-06-09 DIAGNOSIS — H401131 Primary open-angle glaucoma, bilateral, mild stage: Secondary | ICD-10-CM | POA: Diagnosis not present

## 2022-06-09 DIAGNOSIS — R2689 Other abnormalities of gait and mobility: Secondary | ICD-10-CM | POA: Diagnosis not present

## 2022-06-09 DIAGNOSIS — R279 Unspecified lack of coordination: Secondary | ICD-10-CM | POA: Diagnosis not present

## 2022-06-09 DIAGNOSIS — M6281 Muscle weakness (generalized): Secondary | ICD-10-CM | POA: Diagnosis not present

## 2022-06-10 DIAGNOSIS — N3946 Mixed incontinence: Secondary | ICD-10-CM | POA: Diagnosis not present

## 2022-06-10 DIAGNOSIS — M6281 Muscle weakness (generalized): Secondary | ICD-10-CM | POA: Diagnosis not present

## 2022-06-10 DIAGNOSIS — R2689 Other abnormalities of gait and mobility: Secondary | ICD-10-CM | POA: Diagnosis not present

## 2022-06-10 DIAGNOSIS — R279 Unspecified lack of coordination: Secondary | ICD-10-CM | POA: Diagnosis not present

## 2022-06-11 DIAGNOSIS — R2689 Other abnormalities of gait and mobility: Secondary | ICD-10-CM | POA: Diagnosis not present

## 2022-06-11 DIAGNOSIS — M6281 Muscle weakness (generalized): Secondary | ICD-10-CM | POA: Diagnosis not present

## 2022-06-11 DIAGNOSIS — N3946 Mixed incontinence: Secondary | ICD-10-CM | POA: Diagnosis not present

## 2022-06-11 DIAGNOSIS — R279 Unspecified lack of coordination: Secondary | ICD-10-CM | POA: Diagnosis not present

## 2022-06-12 DIAGNOSIS — M6281 Muscle weakness (generalized): Secondary | ICD-10-CM | POA: Diagnosis not present

## 2022-06-12 DIAGNOSIS — R2689 Other abnormalities of gait and mobility: Secondary | ICD-10-CM | POA: Diagnosis not present

## 2022-06-12 DIAGNOSIS — N3946 Mixed incontinence: Secondary | ICD-10-CM | POA: Diagnosis not present

## 2022-06-12 DIAGNOSIS — R279 Unspecified lack of coordination: Secondary | ICD-10-CM | POA: Diagnosis not present

## 2022-06-15 DIAGNOSIS — R2689 Other abnormalities of gait and mobility: Secondary | ICD-10-CM | POA: Diagnosis not present

## 2022-06-15 DIAGNOSIS — R279 Unspecified lack of coordination: Secondary | ICD-10-CM | POA: Diagnosis not present

## 2022-06-15 DIAGNOSIS — N3946 Mixed incontinence: Secondary | ICD-10-CM | POA: Diagnosis not present

## 2022-06-15 DIAGNOSIS — M6281 Muscle weakness (generalized): Secondary | ICD-10-CM | POA: Diagnosis not present

## 2022-06-16 DIAGNOSIS — R2689 Other abnormalities of gait and mobility: Secondary | ICD-10-CM | POA: Diagnosis not present

## 2022-06-16 DIAGNOSIS — R279 Unspecified lack of coordination: Secondary | ICD-10-CM | POA: Diagnosis not present

## 2022-06-16 DIAGNOSIS — M6281 Muscle weakness (generalized): Secondary | ICD-10-CM | POA: Diagnosis not present

## 2022-06-16 DIAGNOSIS — N3946 Mixed incontinence: Secondary | ICD-10-CM | POA: Diagnosis not present

## 2022-06-17 DIAGNOSIS — M6281 Muscle weakness (generalized): Secondary | ICD-10-CM | POA: Diagnosis not present

## 2022-06-17 DIAGNOSIS — R2689 Other abnormalities of gait and mobility: Secondary | ICD-10-CM | POA: Diagnosis not present

## 2022-06-17 DIAGNOSIS — N3946 Mixed incontinence: Secondary | ICD-10-CM | POA: Diagnosis not present

## 2022-06-17 DIAGNOSIS — R279 Unspecified lack of coordination: Secondary | ICD-10-CM | POA: Diagnosis not present

## 2022-06-18 DIAGNOSIS — N3946 Mixed incontinence: Secondary | ICD-10-CM | POA: Diagnosis not present

## 2022-06-18 DIAGNOSIS — R2689 Other abnormalities of gait and mobility: Secondary | ICD-10-CM | POA: Diagnosis not present

## 2022-06-18 DIAGNOSIS — M6281 Muscle weakness (generalized): Secondary | ICD-10-CM | POA: Diagnosis not present

## 2022-06-18 DIAGNOSIS — R279 Unspecified lack of coordination: Secondary | ICD-10-CM | POA: Diagnosis not present

## 2022-06-20 ENCOUNTER — Encounter (HOSPITAL_COMMUNITY): Payer: Self-pay

## 2022-06-20 ENCOUNTER — Emergency Department (HOSPITAL_COMMUNITY): Payer: Medicare Other

## 2022-06-20 ENCOUNTER — Inpatient Hospital Stay (HOSPITAL_COMMUNITY)
Admission: EM | Admit: 2022-06-20 | Discharge: 2022-06-23 | DRG: 312 | Disposition: A | Discharge status: Home Health | Payer: Medicare Other | Attending: Family Medicine | Admitting: Family Medicine

## 2022-06-20 ENCOUNTER — Other Ambulatory Visit: Payer: Self-pay

## 2022-06-20 DIAGNOSIS — I10 Essential (primary) hypertension: Secondary | ICD-10-CM | POA: Diagnosis present

## 2022-06-20 DIAGNOSIS — R279 Unspecified lack of coordination: Secondary | ICD-10-CM | POA: Diagnosis not present

## 2022-06-20 DIAGNOSIS — F200 Paranoid schizophrenia: Secondary | ICD-10-CM | POA: Diagnosis present

## 2022-06-20 DIAGNOSIS — Z9012 Acquired absence of left breast and nipple: Secondary | ICD-10-CM | POA: Diagnosis not present

## 2022-06-20 DIAGNOSIS — Z7985 Long-term (current) use of injectable non-insulin antidiabetic drugs: Secondary | ICD-10-CM | POA: Diagnosis not present

## 2022-06-20 DIAGNOSIS — R2689 Other abnormalities of gait and mobility: Secondary | ICD-10-CM | POA: Diagnosis not present

## 2022-06-20 DIAGNOSIS — Z79899 Other long term (current) drug therapy: Secondary | ICD-10-CM

## 2022-06-20 DIAGNOSIS — R739 Hyperglycemia, unspecified: Secondary | ICD-10-CM

## 2022-06-20 DIAGNOSIS — R911 Solitary pulmonary nodule: Secondary | ICD-10-CM | POA: Diagnosis present

## 2022-06-20 DIAGNOSIS — E1165 Type 2 diabetes mellitus with hyperglycemia: Secondary | ICD-10-CM | POA: Diagnosis not present

## 2022-06-20 DIAGNOSIS — N3000 Acute cystitis without hematuria: Principal | ICD-10-CM | POA: Diagnosis present

## 2022-06-20 DIAGNOSIS — Z79890 Hormone replacement therapy (postmenopausal): Secondary | ICD-10-CM | POA: Diagnosis not present

## 2022-06-20 DIAGNOSIS — R55 Syncope and collapse: Secondary | ICD-10-CM | POA: Diagnosis not present

## 2022-06-20 DIAGNOSIS — E039 Hypothyroidism, unspecified: Secondary | ICD-10-CM | POA: Diagnosis present

## 2022-06-20 DIAGNOSIS — Z1152 Encounter for screening for COVID-19: Secondary | ICD-10-CM | POA: Diagnosis not present

## 2022-06-20 DIAGNOSIS — E785 Hyperlipidemia, unspecified: Secondary | ICD-10-CM | POA: Diagnosis not present

## 2022-06-20 DIAGNOSIS — D329 Benign neoplasm of meninges, unspecified: Secondary | ICD-10-CM | POA: Diagnosis present

## 2022-06-20 DIAGNOSIS — R7989 Other specified abnormal findings of blood chemistry: Secondary | ICD-10-CM | POA: Diagnosis not present

## 2022-06-20 DIAGNOSIS — R4182 Altered mental status, unspecified: Secondary | ICD-10-CM | POA: Diagnosis not present

## 2022-06-20 DIAGNOSIS — Z87891 Personal history of nicotine dependence: Secondary | ICD-10-CM

## 2022-06-20 DIAGNOSIS — I5031 Acute diastolic (congestive) heart failure: Secondary | ICD-10-CM | POA: Diagnosis not present

## 2022-06-20 DIAGNOSIS — M6281 Muscle weakness (generalized): Secondary | ICD-10-CM | POA: Diagnosis not present

## 2022-06-20 DIAGNOSIS — G9389 Other specified disorders of brain: Secondary | ICD-10-CM | POA: Diagnosis not present

## 2022-06-20 DIAGNOSIS — N3946 Mixed incontinence: Secondary | ICD-10-CM | POA: Diagnosis not present

## 2022-06-20 LAB — VALPROIC ACID LEVEL: Valproic Acid Lvl: 113 ug/mL — ABNORMAL HIGH (ref 50.0–100.0)

## 2022-06-20 LAB — BASIC METABOLIC PANEL
Anion gap: 13 (ref 5–15)
BUN: 32 mg/dL — ABNORMAL HIGH (ref 8–23)
CO2: 27 mmol/L (ref 22–32)
Calcium: 9.3 mg/dL (ref 8.9–10.3)
Chloride: 92 mmol/L — ABNORMAL LOW (ref 98–111)
Creatinine, Ser: 0.92 mg/dL (ref 0.44–1.00)
GFR, Estimated: >60 mL/min (ref >60)
Glucose, Bld: 726 mg/dL (ref 70–99)
Potassium: 4.4 mmol/L (ref 3.5–5.1)
Sodium: 132 mmol/L — ABNORMAL LOW (ref 135–145)

## 2022-06-20 LAB — HEPATIC FUNCTION PANEL
ALT: 18 U/L (ref 0–44)
AST: 20 U/L (ref 15–41)
Albumin: 3.7 g/dL (ref 3.5–5.0)
Alkaline Phosphatase: 74 U/L (ref 38–126)
Bilirubin, Direct: <0.1 mg/dL (ref 0.0–0.2)
Total Bilirubin: 0.8 mg/dL (ref 0.3–1.2)
Total Protein: 7 g/dL (ref 6.5–8.1)

## 2022-06-20 LAB — URINALYSIS, ROUTINE W REFLEX MICROSCOPIC
Bilirubin Urine: NEGATIVE
Glucose, UA: >=500 mg/dL — AB
Hgb urine dipstick: NEGATIVE
Ketones, ur: 20 mg/dL — AB
Nitrite: NEGATIVE
Protein, ur: NEGATIVE mg/dL
Specific Gravity, Urine: 1.02 (ref 1.005–1.030)
pH: 8 (ref 5.0–8.0)

## 2022-06-20 LAB — CBG MONITORING, ED
Glucose-Capillary: 288 mg/dL — ABNORMAL HIGH (ref 70–99)
Glucose-Capillary: 354 mg/dL — ABNORMAL HIGH (ref 70–99)
Glucose-Capillary: >600 mg/dL (ref 70–99)

## 2022-06-20 LAB — BLOOD GAS, VENOUS
Acid-Base Excess: 4 mmol/L — ABNORMAL HIGH (ref 0.0–2.0)
Bicarbonate: 29.7 mmol/L — ABNORMAL HIGH (ref 20.0–28.0)
O2 Saturation: 45.6 %
Patient temperature: 37
pCO2, Ven: 48 mmHg (ref 44–60)
pH, Ven: 7.4 (ref 7.25–7.43)
pO2, Ven: <31 mmHg — CL (ref 32–45)

## 2022-06-20 LAB — CBC
HCT: 42.9 % (ref 36.0–46.0)
Hemoglobin: 14.2 g/dL (ref 12.0–15.0)
MCH: 29.3 pg (ref 26.0–34.0)
MCHC: 33.1 g/dL (ref 30.0–36.0)
MCV: 88.6 fL (ref 80.0–100.0)
Platelets: 199 10*3/uL (ref 150–400)
RBC: 4.84 MIL/uL (ref 3.87–5.11)
RDW: 12.8 % (ref 11.5–15.5)
WBC: 5.4 10*3/uL (ref 4.0–10.5)
nRBC: 0 % (ref 0.0–0.2)

## 2022-06-20 LAB — CBC WITH DIFFERENTIAL/PLATELET
Abs Immature Granulocytes: 0.04 10*3/uL (ref 0.00–0.07)
Basophils Absolute: 0 10*3/uL (ref 0.0–0.1)
Basophils Relative: 0 %
Eosinophils Absolute: 0 10*3/uL (ref 0.0–0.5)
Eosinophils Relative: 0 %
HCT: 43.6 % (ref 36.0–46.0)
Hemoglobin: 14.8 g/dL (ref 12.0–15.0)
Immature Granulocytes: 1 %
Lymphocytes Relative: 17 %
Lymphs Abs: 1.1 10*3/uL (ref 0.7–4.0)
MCH: 29.7 pg (ref 26.0–34.0)
MCHC: 33.9 g/dL (ref 30.0–36.0)
MCV: 87.6 fL (ref 80.0–100.0)
Monocytes Absolute: 0.5 10*3/uL (ref 0.1–1.0)
Monocytes Relative: 8 %
Neutro Abs: 5 10*3/uL (ref 1.7–7.7)
Neutrophils Relative %: 74 %
Platelets: 197 10*3/uL (ref 150–400)
RBC: 4.98 MIL/uL (ref 3.87–5.11)
RDW: 12.8 % (ref 11.5–15.5)
WBC: 6.8 10*3/uL (ref 4.0–10.5)
nRBC: 0 % (ref 0.0–0.2)

## 2022-06-20 LAB — RESP PANEL BY RT-PCR (RSV, FLU A&B, COVID)  RVPGX2
Influenza A by PCR: NEGATIVE
Influenza B by PCR: NEGATIVE
Resp Syncytial Virus by PCR: NEGATIVE
SARS Coronavirus 2 by RT PCR: NEGATIVE

## 2022-06-20 LAB — TROPONIN I (HIGH SENSITIVITY)
Troponin I (High Sensitivity): 467 ng/L (ref <18)
Troponin I (High Sensitivity): 349 ng/L (ref <18)

## 2022-06-20 LAB — LACTIC ACID, PLASMA
Lactic Acid, Venous: 1.5 mmol/L (ref 0.5–1.9)
Lactic Acid, Venous: 2.8 mmol/L (ref 0.5–1.9)

## 2022-06-20 LAB — PHOSPHORUS: Phosphorus: 3.8 mg/dL (ref 2.5–4.6)

## 2022-06-20 LAB — LIPASE, BLOOD: Lipase: 57 U/L — ABNORMAL HIGH (ref 11–51)

## 2022-06-20 LAB — AMMONIA: Ammonia: 14 umol/L (ref 9–35)

## 2022-06-20 LAB — MAGNESIUM: Magnesium: 2.1 mg/dL (ref 1.7–2.4)

## 2022-06-20 LAB — BRAIN NATRIURETIC PEPTIDE: B Natriuretic Peptide: 253.6 pg/mL — ABNORMAL HIGH (ref 0.0–100.0)

## 2022-06-20 MED ORDER — ACETAMINOPHEN 500 MG PO TABS: 500.0000 mg | ORAL_TABLET | Freq: Four times a day (QID) | ORAL | Status: DC | PRN

## 2022-06-20 MED ORDER — IRBESARTAN 300 MG PO TABS
300.0000 mg | ORAL_TABLET | Freq: Every day | ORAL | Status: DC
  Administered 2022-06-21 – 2022-06-23 (×3): 300 mg via ORAL
  Filled 2022-06-20 (×3): qty 1

## 2022-06-20 MED ORDER — VALSARTAN-HYDROCHLOROTHIAZIDE 320-12.5 MG PO TABS: 1.0000 | ORAL_TABLET | Freq: Every day | ORAL | Status: DC

## 2022-06-20 MED ORDER — SODIUM CHLORIDE 0.9 % IV SOLN
1.0000 g | Freq: Once | INTRAVENOUS | Status: AC
  Administered 2022-06-20: 1 g via INTRAVENOUS
  Filled 2022-06-20: qty 10

## 2022-06-20 MED ORDER — OLANZAPINE 5 MG PO TABS
15.0000 mg | ORAL_TABLET | Freq: Every day | ORAL | Status: DC
  Administered 2022-06-20 – 2022-06-22 (×3): 15 mg via ORAL
  Filled 2022-06-20 (×3): qty 3

## 2022-06-20 MED ORDER — HYDROCHLOROTHIAZIDE 12.5 MG PO TABS
12.5000 mg | ORAL_TABLET | Freq: Every day | ORAL | Status: DC
  Administered 2022-06-21 – 2022-06-23 (×3): 12.5 mg via ORAL
  Filled 2022-06-20 (×3): qty 1

## 2022-06-20 MED ORDER — ONDANSETRON HCL 4 MG PO TABS: 4.0000 mg | ORAL_TABLET | Freq: Four times a day (QID) | ORAL | Status: DC | PRN

## 2022-06-20 MED ORDER — AMLODIPINE BESYLATE 5 MG PO TABS
2.5000 mg | ORAL_TABLET | Freq: Every day | ORAL | Status: DC
  Administered 2022-06-21 – 2022-06-23 (×3): 2.5 mg via ORAL
  Filled 2022-06-20 (×2): qty 1

## 2022-06-20 MED ORDER — SODIUM CHLORIDE 0.9 % IV SOLN: INTRAVENOUS | Status: AC

## 2022-06-20 MED ORDER — LEVOTHYROXINE SODIUM 112 MCG PO TABS
112.0000 ug | ORAL_TABLET | Freq: Every day | ORAL | Status: DC
  Administered 2022-06-21 – 2022-06-23 (×3): 112 ug via ORAL
  Filled 2022-06-20 (×3): qty 1

## 2022-06-20 MED ORDER — ONDANSETRON HCL 4 MG/2ML IJ SOLN: 4.0000 mg | Freq: Four times a day (QID) | INTRAMUSCULAR | Status: DC | PRN

## 2022-06-20 MED ORDER — VITAMIN D 25 MCG (1000 UNIT) PO TABS
1000.0000 [IU] | ORAL_TABLET | Freq: Every day | ORAL | Status: DC
  Administered 2022-06-21 – 2022-06-23 (×3): 1000 [IU] via ORAL
  Filled 2022-06-20 (×3): qty 1

## 2022-06-20 MED ORDER — SODIUM CHLORIDE 0.9 % IV SOLN
1.0000 g | INTRAVENOUS | Status: DC
  Administered 2022-06-21 – 2022-06-22 (×2): 1 g via INTRAVENOUS
  Filled 2022-06-20 (×2): qty 10

## 2022-06-20 MED ORDER — IOHEXOL 350 MG/ML SOLN
75.0000 mL | Freq: Once | INTRAVENOUS | Status: AC | PRN
  Administered 2022-06-20: 75 mL via INTRAVENOUS

## 2022-06-20 MED ORDER — MEMANTINE HCL 10 MG PO TABS
10.0000 mg | ORAL_TABLET | Freq: Two times a day (BID) | ORAL | Status: DC
  Administered 2022-06-20 – 2022-06-23 (×6): 10 mg via ORAL
  Filled 2022-06-20 (×5): qty 1

## 2022-06-20 MED ORDER — SODIUM CHLORIDE 0.9 % IV BOLUS
1000.0000 mL | Freq: Once | INTRAVENOUS | Status: AC
  Administered 2022-06-20: 1000 mL via INTRAVENOUS

## 2022-06-20 MED ORDER — INSULIN ASPART 100 UNIT/ML IJ SOLN
10.0000 [IU] | Freq: Once | INTRAMUSCULAR | Status: AC
  Administered 2022-06-20: 10 [IU] via SUBCUTANEOUS
  Filled 2022-06-20: qty 0.1

## 2022-06-20 MED ORDER — SODIUM CHLORIDE 0.9% FLUSH
3.0000 mL | Freq: Two times a day (BID) | INTRAVENOUS | Status: DC
  Administered 2022-06-21 – 2022-06-22 (×4): 3 mL via INTRAVENOUS

## 2022-06-20 MED ORDER — HEPARIN SODIUM (PORCINE) 5000 UNIT/ML IJ SOLN
5000.0000 [IU] | Freq: Three times a day (TID) | INTRAMUSCULAR | Status: DC
  Administered 2022-06-21 – 2022-06-23 (×7): 5000 [IU] via SUBCUTANEOUS
  Filled 2022-06-20 (×7): qty 1

## 2022-06-20 MED ORDER — ACETAMINOPHEN 650 MG RE SUPP: 650.0000 mg | Freq: Four times a day (QID) | RECTAL | Status: DC | PRN

## 2022-06-20 MED ORDER — SIMVASTATIN 20 MG PO TABS
20.0000 mg | ORAL_TABLET | Freq: Every day | ORAL | Status: DC
  Administered 2022-06-21 – 2022-06-23 (×3): 20 mg via ORAL
  Filled 2022-06-20 (×3): qty 1

## 2022-06-20 MED ORDER — LATANOPROST 0.005 % OP SOLN
1.0000 [drp] | Freq: Every day | OPHTHALMIC | Status: DC
  Administered 2022-06-21 – 2022-06-22 (×2): 1 [drp] via OPHTHALMIC
  Filled 2022-06-20: qty 2.5

## 2022-06-20 MED ORDER — ATENOLOL 50 MG PO TABS
100.0000 mg | ORAL_TABLET | Freq: Every day | ORAL | Status: DC
  Administered 2022-06-21 – 2022-06-23 (×3): 100 mg via ORAL
  Filled 2022-06-20 (×3): qty 2

## 2022-06-20 MED ORDER — SODIUM CHLORIDE 0.9 % IV SOLN: Freq: Once | INTRAVENOUS | Status: AC

## 2022-06-20 MED ORDER — LACTATED RINGERS IV SOLN: INTRAVENOUS | Status: DC

## 2022-06-20 MED ORDER — ACETAMINOPHEN 325 MG PO TABS: 650.0000 mg | ORAL_TABLET | Freq: Four times a day (QID) | ORAL | Status: DC | PRN

## 2022-06-20 MED ORDER — SODIUM CHLORIDE 0.45 % IV BOLUS
500.0000 mL | Freq: Once | INTRAVENOUS | Status: AC
  Administered 2022-06-21: 500 mL via INTRAVENOUS

## 2022-06-20 MED ORDER — LACTATED RINGERS IV BOLUS
500.0000 mL | Freq: Once | INTRAVENOUS | Status: AC
  Administered 2022-06-20: 500 mL via INTRAVENOUS

## 2022-06-20 NOTE — ED Notes (Signed)
CBG: Hi

## 2022-06-20 NOTE — H&P (Incomplete)
History and Physical    Alexandra TYRA [id number] DOB: 1946/01/20 DOA: 06/20/2022  PCP: Harlan Stains, MD  Patient coming from: facility  I have personally briefly reviewed patient's old medical records in [place]  Chief Complaint: syncope Alexandra Lamb episode  HPI: Alexandra Lamb is a 77 y.o. female with medical history significant of  Schizophrenia,DMII, HLD, Hypertension, hypothyrodism  who present from  [facility] due to episode of unresponsiveness.  Of note in the field EMS notes she was sitting in dining room and slumped over and had period of unresponsiveness. EMS notes on arrival patient was alert but noted to be altered and hallucinating but  able to follow commands. Patient was also noted to have very elevated sugars.Of note patient is unable to give history and history is taken from chart and sister at bedside. ROS unable to be obtained  ED Course:  Afeb, bp 167/71, HR 84, rr 20  sat 99% on ra  Wbc 5.4, hgb 14.2, plt 199 Na 132( 135) K 4.4 , gly 726, cr 0.92,  UA + LE , rare bacteria , wbc 21-50 Ph 7.4/co2 48  EKG snr  poor baseline tracing repeat pending Lacti 1.5 Lipase 57 CE 457,349 Bnp 253 Fs 600 Cxr NAD Resp panel neg Valproic acid 113 Lactic 2.8  CT head  Atrophy and chronic small vessel ischemic changes. Left posterior fossa extra-axial 1 cm lesion, possibly a meningioma which can be confirmed with contrast-enhanced MRI. Otherwise no acute intracranial process identified.  CTPE: neg PE Tx ctx  MPRESSION: 1. No evidence for acute pulmonary embolus. 2. Part solid cystic nodule within the right apex measures up to 1.2 cm with the largest solid component measuring up to 6 mm. Follow-up non-contrast CT recommended at 3-6 months to confirm persistence. If unchanged, and solid component remains <6 mm, annual CT is recommended until 5 years of stability has been established. If persistent these nodules should be considered highly suspicious if the solid  component of the nodule is 6 mm or greater in size and enlarging Review of Systems: As per HPI otherwise 10 point review of systems negative.   Past Medical History:  Diagnosis Date   Cancer ([place])    left breast   Diabetes mellitus    Hyperlipidemia    Hypertension    MCI (mild cognitive impairment)    Schizophrenia ([place])    paranoid    Past Surgical History:  Procedure Laterality Date   BREAST SURGERY  2011   left breast mast   MASTECTOMY     left   TOOTH EXTRACTION       reports that she quit smoking about 15 years ago. She has never used smokeless tobacco. She reports that she does not drink alcohol and does not use drugs.  No Known Allergies  Family History  Problem Relation Age of Onset   Cancer Maternal Aunt        brain tumor   Dementia Neg Hx     Prior to Admission medications   Medication Sig Start Date End Date Taking? Authorizing Provider  acetaminophen (TYLENOL) 500 MG tablet Take 500 mg by mouth every 6 (six) hours as needed.     Yes [provider]  amLODipine (NORVASC) 2.5 MG tablet Take 2.5 mg by mouth daily.   Yes [provider]  atenolol (TENORMIN) 100 MG tablet Take 100 mg by mouth daily.   Yes [provider]  Cholecalciferol (RA VITAMIN D-3) 2000 units CAPS Take 1 capsule (  2,000 Units total) by mouth daily. 12/21/17  Yes Nicholas Lose, MD  divalproex (DEPAKOTE ER) 500 MG 24 hr tablet 1,500 mg at bedtime. 09/28/19  Yes [provider]  Dulaglutide (TRULICITY) 4.5 0000000 SOPN Inject 0.5 mLs into the skin every 7 (seven) days.   Yes [provider]  latanoprost (XALATAN) 0.005 % ophthalmic solution Place 1 drop into both eyes at bedtime.   Yes [provider]  levothyroxine (SYNTHROID) 112 MCG tablet Take 112 mcg by mouth daily before breakfast.   Yes [provider]  memantine (NAMENDA) 10 MG tablet Take 10 mg by mouth 2 (two) times daily.   Yes [provider]  OLANZapine  (ZYPREXA) 15 MG tablet Take 15 mg by mouth at bedtime.   Yes [provider]  simvastatin (ZOCOR) 20 MG tablet Take 20 mg by mouth daily.   Yes [provider]  valsartan-hydrochlorothiazide (DIOVAN-HCT) 320-12.5 MG tablet Take 1 tablet by mouth daily.   Yes [provider]  losartan (COZAAR) 100 MG tablet Take 100 mg by mouth daily.   Patient not taking: Reported on 06/20/2022    [provider]  Multiple Vitamins-Minerals (MULTIVITAMIN WITH MINERALS) tablet Take 1 tablet by mouth daily.   Patient not taking: Reported on 06/20/2022    [provider]    Physical Exam: Vitals:   06/20/22 1630 06/20/22 1645 06/20/22 2000 06/20/22 2113  BP: (!) 154/78 (!) 141/66 (!) 127/104 (!) 131/54  Pulse: 84 83 76 73  Resp: '19 17 17 19  '$ Temp:  97.8 F (36.6 C)  97.9 F (36.6 C)  TempSrc:    Oral  SpO2: 95% 97% 100% 95%  Weight:      Height:        Constitutional: NAD, calm, comfortable Vitals:   06/20/22 1630 06/20/22 1645 06/20/22 2000 06/20/22 2113  BP: (!) 154/78 (!) 141/66 (!) 127/104 (!) 131/54  Pulse: 84 83 76 73  Resp: '19 17 17 19  '$ Temp:  97.8 F (36.6 C)  97.9 F (36.6 C)  TempSrc:    Oral  SpO2: 95% 97% 100% 95%  Weight:      Height:       Eyes: PERRL, lids and conjunctivae normal ENMT: Mucous membranes are moist. Posterior pharynx clear of any exudate or lesions.Normal dentition.  Neck: normal, supple, no masses, no thyromegaly Respiratory: clear to auscultation bilaterally, no wheezing, no crackles. Normal respiratory effort. No accessory muscle use.  Cardiovascular: Regular rate and rhythm, no murmurs / rubs / gallops. No extremity edema. 2+ pedal pulses. No carotid bruits.  Abdomen: no tenderness, no masses palpated. No hepatosplenomegaly. Bowel sounds positive.  Musculoskeletal: no clubbing / cyanosis. No joint deformity upper and lower extremities. Good ROM, no contractures. Normal muscle tone.  Skin: no rashes, lesions, ulcers. No  induration Neurologic: CN 2-12 grossly intact. Sensation intact, DTR normal. Strength 5/5 in all 4.  Psychiatric: Normal judgment and insight. Alert and oriented x 3. Normal mood.    Labs on Admission: I have personally reviewed following labs and imaging studies  CBC: Recent Labs  Lab 06/20/22 1343 06/20/22 1530  WBC 5.4 6.8  NEUTROABS  --  5.0  HGB 14.2 14.8  HCT 42.9 43.6  MCV 88.6 87.6  PLT 199 [id number]   Basic Metabolic Panel: Recent Labs  Lab 06/20/22 1343 06/20/22 1355  NA 132*  --   K 4.4  --   CL 92*  --   CO2 27  --   GLUCOSE 726*  --  BUN 32*  --   CREATININE 0.92  --   CALCIUM 9.3  --   MG  --  2.1  PHOS  --  3.8   GFR: Estimated Creatinine Clearance: 52.2 mL/min (by C-G formula based on SCr of 0.92 mg/dL). Liver Function Tests: Recent Labs  Lab 06/20/22 1355  AST 20  ALT 18  ALKPHOS 74  BILITOT 0.8  PROT 7.0  ALBUMIN 3.7   Recent Labs  Lab 06/20/22 1355  LIPASE 57*   Recent Labs  Lab 06/20/22 1355  AMMONIA 14   Coagulation Profile: No results for input(s): "INR", "PROTIME" in the last 168 hours. Cardiac Enzymes: No results for input(s): "CKTOTAL", "CKMB", "CKMBINDEX", "TROPONINI" in the last 168 hours. BNP (last 3 results) No results for input(s): "PROBNP" in the last 8760 hours. HbA1C: No results for input(s): "HGBA1C" in the last 72 hours. CBG: Recent Labs  Lab 06/20/22 1238 06/20/22 1721  GLUCAP >600* 354*   Lipid Profile: No results for input(s): "CHOL", "HDL", "LDLCALC", "TRIG", "CHOLHDL", "LDLDIRECT" in the last 72 hours. Thyroid Function Tests: No results for input(s): "TSH", "T4TOTAL", "FREET4", "T3FREE", "THYROIDAB" in the last 72 hours. Anemia Panel: No results for input(s): "VITAMINB12", "FOLATE", "FERRITIN", "TIBC", "IRON", "RETICCTPCT" in the last 72 hours. Urine analysis:    Component Value Date/Time   COLORURINE STRAW (A) 06/20/2022 1355   APPEARANCEUR CLEAR 06/20/2022 1355   LABSPEC 1.020 06/20/2022 1355    PHURINE 8.0 06/20/2022 1355   GLUCOSEU >=500 (A) 06/20/2022 1355   HGBUR NEGATIVE 06/20/2022 1355   BILIRUBINUR NEGATIVE 06/20/2022 1355   KETONESUR 20 (A) 06/20/2022 1355   PROTEINUR NEGATIVE 06/20/2022 1355   UROBILINOGEN 0.2 12/04/2009 1243   NITRITE NEGATIVE 06/20/2022 1355   LEUKOCYTESUR SMALL (A) 06/20/2022 1355    Radiological Exams on Admission: CT Head Wo Contrast  Result Date: 06/20/2022 CLINICAL DATA:  Mental status change, unknown cause EXAM: CT HEAD WITHOUT CONTRAST TECHNIQUE: Contiguous axial images were obtained from the base of the skull through the vertex without intravenous contrast. RADIATION DOSE REDUCTION: This exam was performed according to the departmental dose-optimization program which includes automated exposure control, adjustment of the mA and/or kV according to patient size and/or use of iterative reconstruction technique. COMPARISON:  None Available. FINDINGS: Brain: There is periventricular white matter decreased attenuation consistent with small vessel ischemic changes. Ventricles, sulci and cisterns are prominent consistent with age related involutional changes. No acute intracranial hemorrhage, mass effect or shift. No hydrocephalus. There is a small extra-axial lesion left posterior fossa laterally that appears to be dural-based measuring 1.0 cm. This likely represents a meningioma which can be confirmed with a contrast-enhanced MRI. No associated mass effect. Vascular: No hyperdense vessel or unexpected calcification. Skull: Normal. Negative for fracture or focal lesion. Sinuses/Orbits: No acute finding. IMPRESSION: Atrophy and chronic small vessel ischemic changes. Left posterior fossa extra-axial 1 cm lesion, possibly a meningioma which can be confirmed with contrast-enhanced MRI. Otherwise no acute intracranial process identified. Electronically Signed   By: Sammie Bench M.D.   On: 06/20/2022 16:44   CT Angio Chest PE W and/or Wo Contrast  Result Date:  06/20/2022 CLINICAL DATA:  Rule out pulmonary embolus.  Elevated troponin EXAM: CT ANGIOGRAPHY CHEST WITH CONTRAST TECHNIQUE: Multidetector CT imaging of the chest was performed using the standard protocol during bolus administration of intravenous contrast. Multiplanar CT image reconstructions and MIPs were obtained to evaluate the vascular anatomy. RADIATION DOSE REDUCTION: This exam was performed according to the departmental dose-optimization program which includes automated  exposure control, adjustment of the mA and/or kV according to patient size and/or use of iterative reconstruction technique. CONTRAST:  9m OMNIPAQUE IOHEXOL 350 MG/ML SOLN COMPARISON:  None FINDINGS: Exam detail is diminished secondary to respiratory motion artifact. Cardiovascular: Satisfactory opacification of the pulmonary arteries to the segmental level. No evidence of pulmonary embolism. Normal heart size. No pericardial effusion. Aortic atherosclerosis. Coronary artery calcifications. Mediastinum/Nodes: No enlarged mediastinal, hilar, or axillary lymph nodes. Signs of prior left axillary node dissection. Thyroid gland and trachea appear normal. Esophagus appears patulous which may reflect chronic dysmotility. Lungs/Pleura: No pleural effusion. No airspace consolidation identified no signs of interstitial edema. In the right apex there is a 1.2 x 1.2 cm part solid cystic nodule. The largest solid component measures up to 6 mm, image 56/5. Upper Abdomen: No acute abnormality. Right adrenal nodule measures 30 Hounsfield units and 1.3 cm, image 256/4. Left adrenal nodule measures 44 Hounsfield units and 1.4 cm, image 244/4. Low-attenuation large mint of the medial arm of the left adrenal gland is noted. Musculoskeletal: No chest wall abnormality. No acute or significant osseous findings. Review of the MIP images confirms the above findings. IMPRESSION: 1. No evidence for acute pulmonary embolus. 2. Part solid cystic nodule within the  right apex measures up to 1.2 cm with the largest solid component measuring up to 6 mm. Follow-up non-contrast CT recommended at 3-6 months to confirm persistence. If unchanged, and solid component remains <6 mm, annual CT is recommended until 5 years of stability has been established. If persistent these nodules should be considered highly suspicious if the solid component of the nodule is 6 mm or greater in size and enlarging. This recommendation follows the consensus statement: Guidelines for Management of Incidental Pulmonary Nodules Detected on CT Images:From the Fleischner Society 2017; published online before print (10.1148/radiol.[id number]:[id number]. 3. 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 Recommend follow-up adrenal washout CT in 1 year. If stable for = 1 year, no further follow-up imaging. JACR 2017 Aug; 14(8):1038-44, JCAT 2016 Mar-Apr; 40(2):194-200, Urol Lamb 2006 Spring; 3(2):71-4. 4. Coronary artery calcifications. 5. Patulous esophagus which may reflect chronic dysmotility. 6.  Aortic Atherosclerosis (ICD10-I70.0). Electronically Signed   By: TKerby MoorsM.D.   On: 06/20/2022 16:40   DG Chest Port 1 View  Result Date: 06/20/2022 CLINICAL DATA:  Acute mental status change EXAM: PORTABLE CHEST 1 VIEW COMPARISON:  None Available. FINDINGS: The heart size and mediastinal contours are within normal limits. Both lungs are clear. The visualized skeletal structures are unremarkable. IMPRESSION: No active disease. Electronically Signed   By: DDorise BullionIII M.D.   On: 06/20/2022 14:11    EKG: Independently reviewed. See above  Assessment/Plan  Syncope vs Metabolic encephalopathy ( uti/ electrolyte derangement) -admit to tele  - place on syncope protocol  - possible due to orthostasis /vs electrolyte derangement -CTH NA -neuro checks  -MRI pending  -consider neuro consult based on mri findings   Hyperglycemic crisis, HHS with metabolic encephalopathy -s/p ivfs  and 10 units of insulin patient noted to have improved fs  - patient also more alert but  continues to be confused ( unclear how far off her baseline she currently is , she is intermittently appropriate) - will start iss/fs  -continue hydration  -current fs 288, will start lantus 15 untis -titrate as needed   Hypothyroidism -resume synthroid -check tsh   Schizophrenia, paranoid type  -per sister current hallucinations not patient baseline  -of note valproic acid level  slight elevated  -will hold for now , will need repeat level prior to restarting  -resume home regimen as able    HLD -continue    Hypertension -stable  -resume home regimen ( amlodipine /arb , hold hctz)  UTI -continue CTX  -f/u on culture data  Brain lesion Left posterior fossa  -mri pending    Lung nodule  -will need f/u repeat ct in 3-6 mo  DVT prophylaxis: heparin Code Status: full/ as discussed per patient wishes in event of cardiac arrest  Family Communication:sister at bedside  Matthieu,Carol (Legal Guardian) [phone] (Mobile)  Disposition Plan: patient  expected to be admitted greater than 2 midnights  Consults called: n/a Admission status: progressive   Clance Boll MD [facility]   If 7PM-7AM, please contact night-coverage [email or web address] Password [facility]  06/20/2022, 10:32 PM

## 2022-06-20 NOTE — H&P (Incomplete)
History and Physical    Alexandra Lamb [id number] DOB: 1946-04-12 DOA: 06/20/2022  PCP: Harlan Stains, MD  Patient coming from: facility  I have personally briefly reviewed patient's old medical records in [facility]  Chief Complaint: syncope [name] episode  HPI: Alexandra Lamb is a 77 y.o. female with medical history significant of  Schizophrenia,DMII, HLD, Hypertension, hypothyrodism  who present from  [facility] due to episode of unresponsiveness.  Of note in the field EMS notes she was sitting in dining room and slumped over and had period of unresponsiveness. EMS notes on arrival patient was alert but noted to be altered and hallucinating but  able to follow commands. Patient was also noted to have very elevated sugars.Of note patient is unable to give history and history is taken from chart and sister at bedside. ROS unable to be obtained  ED Course:  Afeb, bp 167/71, HR 84, rr 20  sat 99% on ra  Wbc 5.4, hgb 14.2, plt 199 Na 132( 135) K 4.4 , gly 726, cr 0.92,  UA + LE , rare bacteria , wbc 21-50 Ph 7.4/co2 48  EKG snr  poor baseline tracing repeat pending Lacti 1.5 Lipase 57 CE 457,349 Bnp 253 Fs 600 Cxr NAD Resp panel neg Valproic acid 113 Lactic 2.8  CT head  Atrophy and chronic small vessel ischemic changes. Left posterior fossa extra-axial 1 cm lesion, possibly a meningioma which can be confirmed with contrast-enhanced MRI. Otherwise no acute intracranial process identified.  CTPE: neg PE Tx ctx  MPRESSION: 1. No evidence for acute pulmonary embolus. 2. Part solid cystic nodule within the right apex measures up to 1.2 cm with the largest solid component measuring up to 6 mm. Follow-up non-contrast CT recommended at 3-6 months to confirm persistence. If unchanged, and solid component remains <6 mm, annual CT is recommended until 5 years of stability has been established. If persistent these nodules should be considered highly suspicious if the solid  component of the nodule is 6 mm or greater in size and enlarging Review of Systems: As per HPI otherwise 10 point review of systems negative.   Past Medical History:  Diagnosis Date  . Cancer ([place])    left breast  . Diabetes mellitus   . Hyperlipidemia   . Hypertension   . MCI (mild cognitive impairment)   . Schizophrenia ([place])    paranoid    Past Surgical History:  Procedure Laterality Date  . BREAST SURGERY  2011   left breast mast  . MASTECTOMY     left  . TOOTH EXTRACTION       reports that she quit smoking about 15 years ago. She has never used smokeless tobacco. She reports that she does not drink alcohol and does not use drugs.  No Known Allergies  Family History  Problem Relation Age of Onset  . Cancer Maternal Aunt        brain tumor  . Dementia Neg Hx     Prior to Admission medications   Medication Sig Start Date End Date Taking? Authorizing Provider  acetaminophen (TYLENOL) 500 MG tablet Take 500 mg by mouth every 6 (six) hours as needed.     Yes [provider]  amLODipine (NORVASC) 2.5 MG tablet Take 2.5 mg by mouth daily.   Yes [provider]  atenolol (TENORMIN) 100 MG tablet Take 100 mg by mouth daily.   Yes [provider]  Cholecalciferol (RA VITAMIN D-3) 2000 units CAPS Take 1 capsule (  2,000 Units total) by mouth daily. 12/21/17  Yes Nicholas Lose, MD  divalproex (DEPAKOTE ER) 500 MG 24 hr tablet 1,500 mg at bedtime. 09/28/19  Yes [provider]  Dulaglutide (TRULICITY) 4.5 0000000 SOPN Inject 0.5 mLs into the skin every 7 (seven) days.   Yes [provider]  latanoprost (XALATAN) 0.005 % ophthalmic solution Place 1 drop into both eyes at bedtime.   Yes [provider]  levothyroxine (SYNTHROID) 112 MCG tablet Take 112 mcg by mouth daily before breakfast.   Yes [provider]  memantine (NAMENDA) 10 MG tablet Take 10 mg by mouth 2 (two) times daily.   Yes [provider]   OLANZapine (ZYPREXA) 15 MG tablet Take 15 mg by mouth at bedtime.   Yes [provider]  simvastatin (ZOCOR) 20 MG tablet Take 20 mg by mouth daily.   Yes [provider]  valsartan-hydrochlorothiazide (DIOVAN-HCT) 320-12.5 MG tablet Take 1 tablet by mouth daily.   Yes [provider]  losartan (COZAAR) 100 MG tablet Take 100 mg by mouth daily.   Patient not taking: Reported on 06/20/2022    [provider]  Multiple Vitamins-Minerals (MULTIVITAMIN WITH MINERALS) tablet Take 1 tablet by mouth daily.   Patient not taking: Reported on 06/20/2022    [provider]    Physical Exam: Vitals:   06/20/22 1630 06/20/22 1645 06/20/22 2000 06/20/22 2113  BP: (!) 154/78 (!) 141/66 (!) 127/104 (!) 131/54  Pulse: 84 83 76 73  Resp: '19 17 17 19  '$ Temp:  97.8 F (36.6 C)  97.9 F (36.6 C)  TempSrc:    Oral  SpO2: 95% 97% 100% 95%  Weight:      Height:        Constitutional: NAD, calm, comfortable Vitals:   06/20/22 1630 06/20/22 1645 06/20/22 2000 06/20/22 2113  BP: (!) 154/78 (!) 141/66 (!) 127/104 (!) 131/54  Pulse: 84 83 76 73  Resp: '19 17 17 19  '$ Temp:  97.8 F (36.6 C)  97.9 F (36.6 C)  TempSrc:    Oral  SpO2: 95% 97% 100% 95%  Weight:      Height:       Eyes: PERRL, lids and conjunctivae normal ENMT: Mucous membranes are moist. Posterior pharynx clear of any exudate or lesions.Normal dentition.  Neck: normal, supple, no masses, no thyromegaly Respiratory: clear to auscultation bilaterally, no wheezing, no crackles. Normal respiratory effort. No accessory muscle use.  Cardiovascular: Regular rate and rhythm, no murmurs / rubs / gallops. No extremity edema. 2+ pedal pulses. No carotid bruits.  Abdomen: no tenderness, no masses palpated. No hepatosplenomegaly. Bowel sounds positive.  Musculoskeletal: no clubbing / cyanosis. No joint deformity upper and lower extremities. Good ROM, no contractures. Normal muscle tone.  Skin: no rashes,  lesions, ulcers. No induration Neurologic: CN 2-12 grossly intact. Sensation intact, DTR normal. Strength 5/5 in all 4.  Psychiatric: Normal judgment and insight. Alert and oriented x 3. Normal mood.    Labs on Admission: I have personally reviewed following labs and imaging studies  CBC: Recent Labs  Lab 06/20/22 1343 06/20/22 1530  WBC 5.4 6.8  NEUTROABS  --  5.0  HGB 14.2 14.8  HCT 42.9 43.6  MCV 88.6 87.6  PLT 199 [id number]   Basic Metabolic Panel: Recent Labs  Lab 06/20/22 1343 06/20/22 1355  NA 132*  --   K 4.4  --   CL 92*  --   CO2 27  --   GLUCOSE 726*  --  BUN 32*  --   CREATININE 0.92  --   CALCIUM 9.3  --   MG  --  2.1  PHOS  --  3.8   GFR: Estimated Creatinine Clearance: 52.2 mL/min (by C-G formula based on SCr of 0.92 mg/dL). Liver Function Tests: Recent Labs  Lab 06/20/22 1355  AST 20  ALT 18  ALKPHOS 74  BILITOT 0.8  PROT 7.0  ALBUMIN 3.7   Recent Labs  Lab 06/20/22 1355  LIPASE 57*   Recent Labs  Lab 06/20/22 1355  AMMONIA 14   Coagulation Profile: No results for input(s): "INR", "PROTIME" in the last 168 hours. Cardiac Enzymes: No results for input(s): "CKTOTAL", "CKMB", "CKMBINDEX", "TROPONINI" in the last 168 hours. BNP (last 3 results) No results for input(s): "PROBNP" in the last 8760 hours. HbA1C: No results for input(s): "HGBA1C" in the last 72 hours. CBG: Recent Labs  Lab 06/20/22 1238 06/20/22 1721  GLUCAP >600* 354*   Lipid Profile: No results for input(s): "CHOL", "HDL", "LDLCALC", "TRIG", "CHOLHDL", "LDLDIRECT" in the last 72 hours. Thyroid Function Tests: No results for input(s): "TSH", "T4TOTAL", "FREET4", "T3FREE", "THYROIDAB" in the last 72 hours. Anemia Panel: No results for input(s): "VITAMINB12", "FOLATE", "FERRITIN", "TIBC", "IRON", "RETICCTPCT" in the last 72 hours. Urine analysis:    Component Value Date/Time   COLORURINE STRAW (A) 06/20/2022 1355   APPEARANCEUR CLEAR 06/20/2022 1355   LABSPEC 1.020  06/20/2022 1355   PHURINE 8.0 06/20/2022 1355   GLUCOSEU >=500 (A) 06/20/2022 1355   HGBUR NEGATIVE 06/20/2022 1355   BILIRUBINUR NEGATIVE 06/20/2022 1355   KETONESUR 20 (A) 06/20/2022 1355   PROTEINUR NEGATIVE 06/20/2022 1355   UROBILINOGEN 0.2 12/04/2009 1243   NITRITE NEGATIVE 06/20/2022 1355   LEUKOCYTESUR SMALL (A) 06/20/2022 1355    Radiological Exams on Admission: CT Head Wo Contrast  Result Date: 06/20/2022 CLINICAL DATA:  Mental status change, unknown cause EXAM: CT HEAD WITHOUT CONTRAST TECHNIQUE: Contiguous axial images were obtained from the base of the skull through the vertex without intravenous contrast. RADIATION DOSE REDUCTION: This exam was performed according to the departmental dose-optimization program which includes automated exposure control, adjustment of the mA and/or kV according to patient size and/or use of iterative reconstruction technique. COMPARISON:  None Available. FINDINGS: Brain: There is periventricular white matter decreased attenuation consistent with small vessel ischemic changes. Ventricles, sulci and cisterns are prominent consistent with age related involutional changes. No acute intracranial hemorrhage, mass effect or shift. No hydrocephalus. There is a small extra-axial lesion left posterior fossa laterally that appears to be dural-based measuring 1.0 cm. This likely represents a meningioma which can be confirmed with a contrast-enhanced MRI. No associated mass effect. Vascular: No hyperdense vessel or unexpected calcification. Skull: Normal. Negative for fracture or focal lesion. Sinuses/Orbits: No acute finding. IMPRESSION: Atrophy and chronic small vessel ischemic changes. Left posterior fossa extra-axial 1 cm lesion, possibly a meningioma which can be confirmed with contrast-enhanced MRI. Otherwise no acute intracranial process identified. Electronically Signed   By: Sammie Bench M.D.   On: 06/20/2022 16:44   CT Angio Chest PE W and/or Wo  Contrast  Result Date: 06/20/2022 CLINICAL DATA:  Rule out pulmonary embolus.  Elevated troponin EXAM: CT ANGIOGRAPHY CHEST WITH CONTRAST TECHNIQUE: Multidetector CT imaging of the chest was performed using the standard protocol during bolus administration of intravenous contrast. Multiplanar CT image reconstructions and MIPs were obtained to evaluate the vascular anatomy. RADIATION DOSE REDUCTION: This exam was performed according to the departmental dose-optimization program which includes automated  exposure control, adjustment of the mA and/or kV according to patient size and/or use of iterative reconstruction technique. CONTRAST:  63m OMNIPAQUE IOHEXOL 350 MG/ML SOLN COMPARISON:  None FINDINGS: Exam detail is diminished secondary to respiratory motion artifact. Cardiovascular: Satisfactory opacification of the pulmonary arteries to the segmental level. No evidence of pulmonary embolism. Normal heart size. No pericardial effusion. Aortic atherosclerosis. Coronary artery calcifications. Mediastinum/Nodes: No enlarged mediastinal, hilar, or axillary lymph nodes. Signs of prior left axillary node dissection. Thyroid gland and trachea appear normal. Esophagus appears patulous which may reflect chronic dysmotility. Lungs/Pleura: No pleural effusion. No airspace consolidation identified no signs of interstitial edema. In the right apex there is a 1.2 x 1.2 cm part solid cystic nodule. The largest solid component measures up to 6 mm, image 56/5. Upper Abdomen: No acute abnormality. Right adrenal nodule measures 30 Hounsfield units and 1.3 cm, image 256/4. Left adrenal nodule measures 44 Hounsfield units and 1.4 cm, image 244/4. Low-attenuation large mint of the medial arm of the left adrenal gland is noted. Musculoskeletal: No chest wall abnormality. No acute or significant osseous findings. Review of the MIP images confirms the above findings. IMPRESSION: 1. No evidence for acute pulmonary embolus. 2. Part solid  cystic nodule within the right apex measures up to 1.2 cm with the largest solid component measuring up to 6 mm. Follow-up non-contrast CT recommended at 3-6 months to confirm persistence. If unchanged, and solid component remains <6 mm, annual CT is recommended until 5 years of stability has been established. If persistent these nodules should be considered highly suspicious if the solid component of the nodule is 6 mm or greater in size and enlarging. This recommendation follows the consensus statement: Guidelines for Management of Incidental Pulmonary Nodules Detected on CT Images:From the Fleischner Society 2017; published online before print (10.1148/radiol.[id number]. 3. 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 Recommend follow-up adrenal washout CT in 1 year. If stable for = 1 year, no further follow-up imaging. JACR 2017 Aug; 14(8):1038-44, JCAT 2016 Mar-Apr; 40(2):194-200, Urol J 2006 Spring; 3(2):71-4. 4. Coronary artery calcifications. 5. Patulous esophagus which may reflect chronic dysmotility. 6.  Aortic Atherosclerosis (ICD10-I70.0). Electronically Signed   By: TKerby MoorsM.D.   On: 06/20/2022 16:40   DG Chest Port 1 View  Result Date: 06/20/2022 CLINICAL DATA:  Acute mental status change EXAM: PORTABLE CHEST 1 VIEW COMPARISON:  None Available. FINDINGS: The heart size and mediastinal contours are within normal limits. Both lungs are clear. The visualized skeletal structures are unremarkable. IMPRESSION: No active disease. Electronically Signed   By: DDorise BullionIII M.D.   On: 06/20/2022 14:11    EKG: Independently reviewed. See above  Assessment/Plan  Syncope vs Metabolic encephalopathy ( uti/ electrolyte derangement) -admit to tele  - place on syncope protocol  - possible due to orthostasis /vs electrolyte derangement -CTH NA -neuro checks  -MRI pending  -consider neuro consult based on mri findings   Hyperglycemic crisis, HHS with metabolic  encephalopathy -s/p ivfs and 10 units of insulin patient noted to have improved fs  - patient also more alert but  continues to be confused ( unclear how far off her baseline she currently is at time she appears appropriate but appears to be deliri - will start iss/fs  -continue hydration   Hypothyroidism -resume synthroid -check tsh   Schizophrenia, paranoid type  -per sister current hallucinations not patient baseline  -of note valproic acid level slight elevated  -will hold for now ,  will need repeat level prior to restarting  -resume home regimen as able    HLD -continue    Hypertension -stable  -resume home regimen ( amlodipine /arb , hold hctz)  UTI -continue CTX  -f/u on culture data  Brain lesion Left posterior fossa  -mri pending    Lung nodule  -will need f/u repeat ct in 3-6 mo  DVT prophylaxis: heparin Code Status: full/ as discussed per patient wishes in event of cardiac arrest  Family Communication:sister at bedside  Matthieu,Carol (Legal Guardian) [phone] (Mobile)  Disposition Plan: patient  expected to be admitted greater than 2 midnights  Consults called: n/a Admission status: progressive   Clance Boll MD [facility] Pager [phone]  If 7PM-7AM, please contact night-coverage [email or web address] Password [facility]  06/20/2022, 10:32 PM

## 2022-06-20 NOTE — ED Triage Notes (Signed)
Pt came from [facility] in [place], staff said that she was unresponsive. EMS found her in the dinning room with a high blood sugar, pt is also hallucinating seeing things that are not there. Will follow direction.

## 2022-06-20 NOTE — ED Provider Notes (Addendum)
Hospitalist service (Dr. Manon Hilding) made aware of case and will evaluate for admission.   Valarie Merino, MD 06/20/22 1711    Valarie Merino, MD 06/20/22 2120

## 2022-06-20 NOTE — ED Provider Notes (Signed)
[place] EMERGENCY DEPARTMENT AT [facility] Provider Note   CSN: [id number] Arrival date & time: 06/20/22  1230     History  No chief complaint on file.   Alexandra Lamb is a 77 y.o. female.  HPI Patient was at her nursing home, [facility] in [place].  She was apparently seated at the dining table and became unresponsive.  She reportedly slumped forward onto the table.  No fall or traumatic injury.  Reportedly she was somewhat confused and poorly responsive at that time.  Blood sugar was checked and was found to be very high.  By report she was also hallucinating a little bit.  When EMS arrived she was responsive and answering questions without focal neurologic deficit.  CBG read high.  Patient denies any specific complaints.  She does not really recall how she was feeling prior to the episode.  Denies having any pain.  She denies headache or chest pain.  She reports she feels pretty well.  Patient sister later arrived.  She reports that the patient is at her baseline for speech and cognitive function.    Home Medications Prior to Admission medications   Medication Sig Start Date End Date Taking? Authorizing Provider  acetaminophen (TYLENOL) 500 MG tablet Take 500 mg by mouth every 6 (six) hours as needed.     Yes [provider]  amLODipine (NORVASC) 2.5 MG tablet Take 2.5 mg by mouth daily.   Yes [provider]  atenolol (TENORMIN) 100 MG tablet Take 100 mg by mouth daily.   Yes [provider]  Cholecalciferol (RA VITAMIN D-3) 2000 units CAPS Take 1 capsule (2,000 Units total) by mouth daily. 12/21/17  Yes Nicholas Lose, MD  divalproex (DEPAKOTE ER) 500 MG 24 hr tablet 1,500 mg at bedtime. 09/28/19  Yes [provider]  Dulaglutide (TRULICITY) 4.5 0000000 SOPN Inject 0.5 mLs into the skin every 7 (seven) days.   Yes [provider]  latanoprost (XALATAN) 0.005 % ophthalmic solution Place 1 drop into both eyes at bedtime.    Yes [provider]  levothyroxine (SYNTHROID) 112 MCG tablet Take 112 mcg by mouth daily before breakfast.   Yes [provider]  memantine (NAMENDA) 10 MG tablet Take 10 mg by mouth 2 (two) times daily.   Yes [provider]  OLANZapine (ZYPREXA) 15 MG tablet Take 15 mg by mouth at bedtime.   Yes [provider]  simvastatin (ZOCOR) 20 MG tablet Take 20 mg by mouth daily.   Yes [provider]  valsartan-hydrochlorothiazide (DIOVAN-HCT) 320-12.5 MG tablet Take 1 tablet by mouth daily.   Yes [provider]  losartan (COZAAR) 100 MG tablet Take 100 mg by mouth daily.   Patient not taking: Reported on 06/20/2022    [provider]  Multiple Vitamins-Minerals (MULTIVITAMIN WITH MINERALS) tablet Take 1 tablet by mouth daily.   Patient not taking: Reported on 06/20/2022    [provider]      Allergies    Patient has no known allergies.    Review of Systems   Review of Systems  Physical Exam Updated Vital Signs BP (!) 167/71 (BP Location: Right Arm)   Pulse 84   Temp 97.6 F (36.4 C) (Oral)   Resp 20   Ht '5\' 4"'$  (1.626 m)   Wt 79.4 kg   SpO2 99%   BMI 30.04 kg/m  Physical Exam Constitutional:      Comments: Patient is alert.  No acute distress.  No respiratory distress.  HENT:     Head: Normocephalic and atraumatic.     Mouth/Throat:     Mouth: Mucous membranes are dry.     Pharynx: Oropharynx is clear.  Eyes:     Extraocular Movements: Extraocular movements intact.  Cardiovascular:     Rate and Rhythm: Normal rate and regular rhythm.  Pulmonary:     Effort: Pulmonary effort is normal.     Breath sounds: Normal breath sounds.  Abdominal:     General: There is no distension.     Palpations: Abdomen is soft.     Tenderness: There is no abdominal tenderness. There is no guarding.  Musculoskeletal:     Cervical back: Neck supple.     Comments: Patient has bilateral edema of the lower legs about 2+.  Some  mild erythema but no active wounds or appearance of cellulitis.  Patient's sister reports she chronically has some edema.  Neurological:     Comments: Patient does not appear to have any focal neurologic deficits.  She appears to have some mild cognitive delay but is appropriate and interactive.  No focal cranial nerve deficit.  Patient's dentures are loose and her speech is slightly difficult to understand but this seems to be more of a mechanical function.  She can perform grip strength symmetrically and has some habitus/deconditioning weakness to the lower extremities but does not appear to have any focal deficit.  Psychiatric:        Mood and Affect: Mood normal.     ED Results / Procedures / Treatments   Labs (all labs ordered are listed, but only abnormal results are displayed) Labs Reviewed  BASIC METABOLIC PANEL - Abnormal; Notable for the following components:      Result Value   Sodium 132 (*)    Chloride 92 (*)    Glucose, Bld 726 (*)    BUN 32 (*)    All other components within normal limits  URINALYSIS, ROUTINE W REFLEX MICROSCOPIC - Abnormal; Notable for the following components:   Color, Urine STRAW (*)    Glucose, UA >=500 (*)    Ketones, ur 20 (*)    Leukocytes,Ua SMALL (*)    Bacteria, UA RARE (*)    All other components within normal limits  BRAIN NATRIURETIC PEPTIDE - Abnormal; Notable for the following components:   B Natriuretic Peptide 253.6 (*)    All other components within normal limits  LIPASE, BLOOD - Abnormal; Notable for the following components:   Lipase 57 (*)    All other components within normal limits  BLOOD GAS, VENOUS - Abnormal; Notable for the following components:   pO2, Ven <31 (*)    Bicarbonate 29.7 (*)    Acid-Base Excess 4.0 (*)    All other components within normal limits  CBG MONITORING, ED - Abnormal; Notable for the following components:   Glucose-Capillary >600 (*)    All other components within normal limits  TROPONIN I (HIGH  SENSITIVITY) - Abnormal; Notable for the following components:   Troponin I (High Sensitivity) 467 (*)    All other components within normal limits  RESP PANEL BY RT-PCR (RSV, FLU A&B, COVID)  RVPGX2  CULTURE, BLOOD (ROUTINE X 2)  CULTURE, BLOOD (ROUTINE X 2)  CBC  LACTIC ACID, PLASMA  MAGNESIUM  PHOSPHORUS  AMMONIA  HEPATIC FUNCTION PANEL  CBC WITH DIFFERENTIAL/PLATELET  LACTIC ACID, PLASMA  VALPROIC ACID LEVEL  CBG MONITORING, ED  TROPONIN I (HIGH SENSITIVITY)    EKG  None  Radiology DG Chest Port 1 View  Result Date: 06/20/2022 CLINICAL DATA:  Acute mental status change EXAM: PORTABLE CHEST 1 VIEW COMPARISON:  None Available. FINDINGS: The heart size and mediastinal contours are within normal limits. Both lungs are clear. The visualized skeletal structures are unremarkable. IMPRESSION: No active disease. Electronically Signed   By: Dorise Bullion III M.D.   On: 06/20/2022 14:11    Procedures Procedures   CRITICAL CARE Performed by: Charlesetta Shanks   Total critical care time: 30 minutes  Critical care time was exclusive of separately billable procedures and treating other patients.  Critical care was necessary to treat or prevent imminent or life-threatening deterioration.  Critical care was time spent personally by me on the following activities: development of treatment plan with patient and/or surrogate as well as nursing, discussions with consultants, evaluation of patient's response to treatment, examination of patient, obtaining history from patient or surrogate, ordering and performing treatments and interventions, ordering and review of laboratory studies, ordering and review of radiographic studies, pulse oximetry and re-evaluation of patient's condition.  Medications Ordered in ED Medications  0.9 %  sodium chloride infusion (has no administration in time range)  iohexol (OMNIPAQUE) 350 MG/ML injection 75 mL (has no administration in time range)  cefTRIAXone  (ROCEPHIN) 1 g in sodium chloride 0.9 % 100 mL IVPB (1 g Intravenous New Bag/Given 06/20/22 1521)  lactated ringers bolus 500 mL (500 mLs Intravenous New Bag/Given 06/20/22 1503)  insulin aspart (novoLOG) injection 10 Units (10 Units Subcutaneous Given 06/20/22 1509)    ED Course/ Medical Decision Making/ A&P                             Medical Decision Making Amount and/or Complexity of Data Reviewed Labs: ordered. Radiology: ordered.  Risk Prescription drug management.   Patient presents after an episode of acute mental status change likely syncope or near syncope.  He does not have good recall of any associated symptoms.  On arrival she does not have focal neurologic deficits to suggest acute stroke.  Differential diagnosis is broad including dysrhythmia\CVA\metabolic derangement\infectious etiology.  CBG greater than 600.  GFR greater than 60 and anion gap of 13.  Patient does not show signs of hyperglycemic coma or DKA.  Mental status is clear.  Troponin is significantly elevated at >400.  At this point may be demand ischemia, other consideration for PE or NSTEMI.  Patient denies any chest pain and does not have any respiratory distress at rest.  Oxygen saturation is 99% on room air.  Will continue to evaluate with diagnostic studies.  Urinalysis is positive, will opt to treat with Rocephin IV.  Will obtain blood cultures.  First lactic acid 1.5, at this time patient is not meeting sepsis criteria.  However, with mental status change, significant hyperglycemia will treat UTI with Rocephin.  Dr. Francia Greaves will assume care at shift change to follow-up on CT results and trending troponins.  Patient's sister and patient updated to this point at bedside.        Final Clinical Impression(s) / ED Diagnoses Final diagnoses:  Acute cystitis without hematuria  Hyperglycemia    Rx / DC Orders ED Discharge Orders     None         Charlesetta Shanks, MD 06/20/22 [phone]

## 2022-06-21 ENCOUNTER — Inpatient Hospital Stay (HOSPITAL_COMMUNITY): Payer: Medicare Other

## 2022-06-21 DIAGNOSIS — I5031 Acute diastolic (congestive) heart failure: Secondary | ICD-10-CM

## 2022-06-21 DIAGNOSIS — R55 Syncope and collapse: Secondary | ICD-10-CM | POA: Diagnosis not present

## 2022-06-21 LAB — ECHOCARDIOGRAM COMPLETE
Area-P 1/2: 2.32 cm2
Calc EF: 62.3 %
MV VTI: 3.48 cm2
Single Plane A2C EF: 55.7 %
Single Plane A4C EF: 65.5 %

## 2022-06-21 LAB — COMPREHENSIVE METABOLIC PANEL
ALT: 13 U/L (ref 0–44)
AST: 19 U/L (ref 15–41)
Albumin: 2.5 g/dL — ABNORMAL LOW (ref 3.5–5.0)
Alkaline Phosphatase: 45 U/L (ref 38–126)
Anion gap: 7 (ref 5–15)
BUN: 20 mg/dL (ref 8–23)
CO2: 25 mmol/L (ref 22–32)
Calcium: 8.2 mg/dL — ABNORMAL LOW (ref 8.9–10.3)
Chloride: 106 mmol/L (ref 98–111)
Creatinine, Ser: 0.69 mg/dL (ref 0.44–1.00)
GFR, Estimated: >60 mL/min (ref >60)
Glucose, Bld: 272 mg/dL — ABNORMAL HIGH (ref 70–99)
Potassium: 3.5 mmol/L (ref 3.5–5.1)
Sodium: 138 mmol/L (ref 135–145)
Total Bilirubin: 0.7 mg/dL (ref 0.3–1.2)
Total Protein: 4.9 g/dL — ABNORMAL LOW (ref 6.5–8.1)

## 2022-06-21 LAB — CBC
HCT: 35.5 % — ABNORMAL LOW (ref 36.0–46.0)
HCT: 37.5 % (ref 36.0–46.0)
Hemoglobin: 11.6 g/dL — ABNORMAL LOW (ref 12.0–15.0)
Hemoglobin: 12.4 g/dL (ref 12.0–15.0)
MCH: 29.6 pg (ref 26.0–34.0)
MCH: 29.7 pg (ref 26.0–34.0)
MCHC: 32.7 g/dL (ref 30.0–36.0)
MCHC: 33.1 g/dL (ref 30.0–36.0)
MCV: 89.7 fL (ref 80.0–100.0)
MCV: 90.6 fL (ref 80.0–100.0)
Platelets: 172 10*3/uL (ref 150–400)
Platelets: 172 10*3/uL (ref 150–400)
RBC: 3.92 MIL/uL (ref 3.87–5.11)
RBC: 4.18 MIL/uL (ref 3.87–5.11)
RDW: 13 % (ref 11.5–15.5)
RDW: 13.2 % (ref 11.5–15.5)
WBC: 6.4 10*3/uL (ref 4.0–10.5)
WBC: 7.1 10*3/uL (ref 4.0–10.5)
nRBC: 0 % (ref 0.0–0.2)
nRBC: 0 % (ref 0.0–0.2)

## 2022-06-21 LAB — LACTIC ACID, PLASMA
Lactic Acid, Venous: 1.3 mmol/L (ref 0.5–1.9)
Lactic Acid, Venous: 1.1 mmol/L (ref 0.5–1.9)

## 2022-06-21 LAB — CREATININE, SERUM
Creatinine, Ser: 0.82 mg/dL (ref 0.44–1.00)
GFR, Estimated: >60 mL/min (ref >60)

## 2022-06-21 LAB — GLUCOSE, CAPILLARY
Glucose-Capillary: 332 mg/dL — ABNORMAL HIGH (ref 70–99)
Glucose-Capillary: 289 mg/dL — ABNORMAL HIGH (ref 70–99)
Glucose-Capillary: 295 mg/dL — ABNORMAL HIGH (ref 70–99)
Glucose-Capillary: 410 mg/dL — ABNORMAL HIGH (ref 70–99)
Glucose-Capillary: 379 mg/dL — ABNORMAL HIGH (ref 70–99)
Glucose-Capillary: 326 mg/dL — ABNORMAL HIGH (ref 70–99)

## 2022-06-21 LAB — TSH: TSH: 2.674 u[IU]/mL (ref 0.350–4.500)

## 2022-06-21 LAB — MAGNESIUM: Magnesium: 1.7 mg/dL (ref 1.7–2.4)

## 2022-06-21 LAB — TROPONIN I (HIGH SENSITIVITY): Troponin I (High Sensitivity): 175 ng/L (ref <18)

## 2022-06-21 LAB — PHOSPHORUS: Phosphorus: 2.4 mg/dL — ABNORMAL LOW (ref 2.5–4.6)

## 2022-06-21 MED ORDER — INSULIN ASPART 100 UNIT/ML IJ SOLN
0.0000 [IU] | Freq: Three times a day (TID) | INTRAMUSCULAR | Status: DC
  Administered 2022-06-21: 15 [IU] via SUBCUTANEOUS
  Administered 2022-06-22 (×2): 5 [IU] via SUBCUTANEOUS
  Administered 2022-06-23 (×2): 3 [IU] via SUBCUTANEOUS

## 2022-06-21 MED ORDER — INSULIN GLARGINE-YFGN 100 UNIT/ML ~~LOC~~ SOLN
15.0000 [IU] | Freq: Every day | SUBCUTANEOUS | Status: DC
  Administered 2022-06-21 – 2022-06-22 (×3): 15 [IU] via SUBCUTANEOUS
  Filled 2022-06-21 (×4): qty 0.15

## 2022-06-21 MED ORDER — INSULIN ASPART 100 UNIT/ML IJ SOLN
3.0000 [IU] | Freq: Once | INTRAMUSCULAR | Status: AC
  Administered 2022-06-21: 3 [IU] via SUBCUTANEOUS

## 2022-06-21 MED ORDER — INSULIN ASPART 100 UNIT/ML IJ SOLN: 0.0000 [IU] | Freq: Three times a day (TID) | INTRAMUSCULAR | Status: DC

## 2022-06-21 NOTE — Inpatient Diabetes Management (Signed)
Inpatient Diabetes Program Recommendations  AACE/ADA: New Consensus Statement on Inpatient Glycemic Control (2015)  Target Ranges:  Prepandial:   less than 140 mg/dL      Peak postprandial:   less than 180 mg/dL (1-2 hours)      Critically ill patients:  140 - 180 mg/dL   Lab Results  Component Value Date   GLUCAP 289 (H) 06/21/2022    Review of Glycemic Control  Latest Reference Range & Units 06/20/22 17:21 06/20/22 23:55 06/21/22 01:20 06/21/22 07:51  Glucose-Capillary 70 - 99 mg/dL 354 (H) 288 (H) 332 (H) 289 (H)   Diabetes history: DM 2 Outpatient Diabetes medications: Trulicity 0.5 mg Weekly Current orders for Inpatient glycemic control:  IV insulin transitioned to: Semglee 15 units qhs  Inpatient Diabetes Program Recommendations:    -  Increase Semglee to 20 units -  Start Novolog 0-9 units tid + hs qhs  Thanks,  Tama Headings RN, MSN, BC-ADM Inpatient Diabetes Coordinator Team Pager [phone] (8a-5p)

## 2022-06-21 NOTE — Progress Notes (Signed)
  Echocardiogram 2D Echocardiogram attempted at 1057. Pt is currently in MRI. Will attempt again when pt returns to the unit.  Eartha Inch 06/21/2022, 10:57 AM

## 2022-06-21 NOTE — Evaluation (Signed)
Physical Therapy Evaluation Patient Details Name: Alexandra Lamb MRN: [id number] DOB: 02/03/1946 Today's Date: 06/21/2022  History of Present Illness  Patient is a 77 year old female who presented on 3/2 after an unresponsive episode in dinning room. Patient was admitted with syncope v.s. metabolic encephalopathy, hyperglycemic crisis and UTI. PMH: schizophrenia, L breast cancer, DM, HTN, L mastectomy  Clinical Impression  Pt admitted as above and presenting with functional mobility limitations 2* generalized weakness, balance deficits, decreased endurance, and poor safety awareness.  Patient lived at [place] prior level at [facility] level. Currently, patient needed min/mod assist for functional mobility with LOB noted with turns with poor safety awareness.  Patient would continue to benefit from skilled PT services at this time while admitted and after d/c to maximize IND and safety.      Recommendations for follow up therapy are one component of a multi-disciplinary discharge planning process, led by the attending physician.  Recommendations may be updated based on patient status, additional functional criteria and insurance authorization.  Follow Up Recommendations Home health PT      Assistance Recommended at Discharge Intermittent Supervision/Assistance  Patient can return home with the following  A little help with walking and/or transfers;A little help with bathing/dressing/bathroom;Assistance with cooking/housework;Assist for transportation;Help with stairs or ramp for entrance    Equipment Recommendations None recommended by PT  Recommendations for Other Services       Functional Status Assessment Patient has had a recent decline in their functional status and demonstrates the ability to make significant improvements in function in a reasonable and predictable amount of time.     Precautions / Restrictions Precautions Precautions: Fall Precaution Comments: monitor O2 and  HR Restrictions Weight Bearing Restrictions: No      Mobility  Bed Mobility Overal bed mobility: Needs Assistance Bed Mobility: Supine to Sit     Supine to sit: Mod assist     General bed mobility comments: with increased cues and physical A to transition into upright sitting on EOB.    Transfers Overall transfer level: Needs assistance   Transfers: Sit to/from Stand Sit to Stand: Min assist, Mod assist           General transfer comment: cues for use of UEs to self assist.  Physical assist for balance and to prevent falling    Ambulation/Gait Ambulation/Gait assistance: Mod assist Gait Distance (Feet): 56 Feet Assistive device: Rolling walker (2 wheels) Gait Pattern/deviations: Step-to pattern, Step-through pattern, Decreased step length - right, Decreased step length - left, Shuffle, Trunk flexed, Staggering right, Staggering left, Drifts right/left Gait velocity: decr     General Gait Details: Cues for posture, position from RW and safety awareness.  Physical assist for balance/support and RW management.  Pt very impulsive on turns and with several episodes requiring mod assist to regain balance and prevent falling  Stairs            Wheelchair Mobility    Modified Rankin (Stroke Patients Only)       Balance Overall balance assessment: Needs assistance Sitting-balance support: Feet supported Sitting balance-Leahy Scale: Fair     Standing balance support: Bilateral upper extremity supported, Reliant on assistive device for balance Standing balance-Leahy Scale: Poor                               Pertinent Vitals/Pain Pain Assessment Pain Assessment: No/denies pain    Home Living Family/patient expects to be discharged to::  Assisted living                 Home Equipment: Conservation officer, nature (2 wheels)      Prior Function Prior Level of Function : Needs assist               ADLs Comments: patient reported using RW for  functional mobility. patient reported being independent in ADLs but having help for showers each week.later in session, patient reported having assistance for ADLs from staff like dressing and toileting tasks.     Hand Dominance        Extremity/Trunk Assessment   Upper Extremity Assessment Upper Extremity Assessment: Defer to OT evaluation    Lower Extremity Assessment Lower Extremity Assessment: Generalized weakness    Cervical / Trunk Assessment Cervical / Trunk Assessment: Normal  Communication   Communication: HOH  Cognition Arousal/Alertness: Awake/alert Behavior During Therapy: Flat affect, Impulsive Overall Cognitive Status: No family/caregiver present to determine baseline cognitive functioning                                 General Comments: patient was noted to have some garbled words at times. unclear if baseline as patient does not understand the question when asked. patient was unable to name the ALF that she lives at. was back and fort with PLOF information during session. no family in room        General Comments      Exercises     Assessment/Plan    PT Assessment Patient needs continued PT services  PT Problem List Decreased strength;Decreased activity tolerance;Decreased balance;Decreased mobility;Decreased knowledge of use of DME;Decreased safety awareness;Obesity       PT Treatment Interventions DME instruction;Gait training;Functional mobility training;Therapeutic activities;Therapeutic exercise;Balance training;Patient/family education    PT Goals (Current goals can be found in the Care Plan section)  Acute Rehab PT Goals Patient Stated Goal: Walk PT Goal Formulation: With patient Time For Goal Achievement: 07/05/22 Potential to Achieve Goals: Fair    Frequency Min 3X/week     Co-evaluation PT/OT/SLP Co-Evaluation/Treatment: Yes Reason for Co-Treatment: To address functional/ADL transfers;For patient/therapist safety PT  goals addressed during session: Mobility/safety with mobility OT goals addressed during session: ADL's and self-care       AM-PAC PT "6 Clicks" Mobility  Outcome Measure Help needed turning from your back to your side while in a flat bed without using bedrails?: A Little Help needed moving from lying on your back to sitting on the side of a flat bed without using bedrails?: A Lot Help needed moving to and from a bed to a chair (including a wheelchair)?: A Lot Help needed standing up from a chair using your arms (e.g., wheelchair or bedside chair)?: A Lot Help needed to walk in hospital room?: A Lot Help needed climbing 3-5 steps with a railing? : Total 6 Click Score: 12    End of Session Equipment Utilized During Treatment: Gait belt Activity Tolerance: Patient tolerated treatment well Patient left: in chair;with call bell/phone within reach;with chair alarm set Nurse Communication: Mobility status PT Visit Diagnosis: Unsteadiness on feet (R26.81);Difficulty in walking, not elsewhere classified (R26.2);Muscle weakness (generalized) (M62.81)    Time: [id number] PT Time Calculation (min) (ACUTE ONLY): 23 min   Charges:   PT Evaluation $PT Eval Low Complexity: 1 Low          [place] Pager [phone] Office [phone]   [facility] 06/21/2022,  4:14 PM

## 2022-06-21 NOTE — Progress Notes (Signed)
SLP Cancellation Note  Patient Details Name: [name] MRN: [id number] DOB: [birth year]   Cancelled treatment:       Reason Eval/Treat Not Completed: Other (comment). Swallow eval no longer needed will sign off.    Emery Binz, Katherene Ponto 06/21/2022, 2:59 PM

## 2022-06-21 NOTE — Progress Notes (Signed)
Order received, insulin given per MD orders

## 2022-06-21 NOTE — Progress Notes (Signed)
PROGRESS NOTE    Alexandra Lamb  [id number] DOB: [birth year] DOA: 06/20/2022 PCP: Harlan Stains, MD   Brief Narrative:  This 77 years old female with PMH significant for schizophrenia, DM 2, hyperlipidemia, hypertension, hypothyroidism presented from the nursing home due to an episode of unresponsiveness.  EMS reports that she was sitting in the dining room and slumped over and had a period of unresponsiveness.  EMS on arrival Patient was alert and noted to be hallucinating but able to follow commands.  She was also noted to have very elevated sugars.  Workup in the ED reveals UA shows positive LE, rare bacteria,  lactic acid 1.5, lipase 57, BNP 253, chest x-ray no acute deformity.  Respiratory viral panel negative.  CT head left posterior fossa lesion possibly meningioma which can be confirmed with contrast-enhanced MRI. CT chest PE protocol negative for PE.  Patient is admitted for further workup.  Assessment & Plan:   Principal Problem:   Syncope  Acute metabolic encephalopathy: This could be syncope versus metabolic encephalopathy. Patient was sitting in the dining room and slumped over and had a period of unresponsiveness. Differentials include orthostatics /electrolyte derangement / UTI CT head negative for acute abnormality. Frequent neuro checks MRI Head: No acute intracranial pathology.  Finding likely meningioma of doubtful clinical significance. Consider Neuro consult if patient continues to remain confused. 2D Echo: Showed LVEF 60 to 65%, no RWMA.   Hyperglycemic crisis: Continue IV fluid resuscitation. Continue regular insulin sliding scale Continue Lantus 15 units and titrate as needed. Carb modified diet.  Hypothyroidism: Continue Synthroid.  Schizophrenia, paranoid type: Patient does not have hallucinations at baseline. Valproic level slightly elevated. -will hold for now , will need repeat level prior to restarting  -resume home regimen as able.    Hyperlipidemia: Continue statin   Hypertension Resume home blood pressure medications,  amlodipine and losartan.   UTI: Continue ceftriaxone 1 g daily Follow-up urine culture   Brain lesion Left posterior fossa  MRI brain finding consistent with meningioma of nonclinical significance   Lung nodule  will need f/u repeat ct in 3-6 mo     DVT prophylaxis: Lovenox Code Status: Full code Family Communication: No family at bed side. Disposition Plan:    Status is: Inpatient Remains inpatient appropriate because: Admitted for syncope likely multifactorial.  Continue IV antibiotics for UTI follow-up urine culture.  Workup so far negative.     Consultants:  None  Procedures: 2D Echo, MRI Brain Antimicrobials:  Anti-infectives (From admission, onward)    Start     Dose/Rate Route Frequency Ordered Stop   06/21/22 1500  cefTRIAXone (ROCEPHIN) 1 g in sodium chloride 0.9 % 100 mL IVPB        1 g 200 mL/hr over 30 Minutes Intravenous Every 24 hours 06/20/22 2335     06/20/22 1430  cefTRIAXone (ROCEPHIN) 1 g in sodium chloride 0.9 % 100 mL IVPB        1 g 200 mL/hr over 30 Minutes Intravenous  Once 06/20/22 1426 06/20/22 1551       Subjective: Patient was seen and examined at bedside.  Overnight events noted.   Patient reports doing better.  She reports improving.  Denies any chest pain or shortness of breath or dizziness.  Objective: Vitals:   06/21/22 0925 06/21/22 1227 06/21/22 1228 06/21/22 1230  BP: (!) 154/80 (!) 158/64 (!) 155/89 (!) 165/135  Pulse: 74 75 75   Resp: '20 20 20   '$ Temp: [id number] F ([id number] C)  97.6 F (36.4 C)    TempSrc: Oral Oral    SpO2: 100% 100% 100%   Weight:      Height:        Intake/Output Summary (Last 24 hours) at 06/21/2022 1348 Last data filed at 06/21/2022 1235 Gross per 24 hour  Intake 642.43 ml  Output 550 ml  Net 92.43 ml   Filed Weights   06/20/22 1250  Weight: 79.4 kg    Examination:  General exam: Appears calm and  comfortable , deconditioned, not in any distress Respiratory system: CTA bilaterally, respiratory effort normal, RR 13. Cardiovascular system: S1 & S2 heard, RRR. No JVD, murmurs, rubs, gallops or clicks. No pedal edema. Gastrointestinal system: Abdomen is soft, non tender, non distended, BS+ Central nervous system: Alert and oriented  x 3 . No focal neurological deficits. Extremities: No edema, no cyanosis, no clubbing Skin: No rashes, lesions or ulcers Psychiatry: Judgement and insight appear normal. Mood & affect appropriate.     Data Reviewed: I have personally reviewed following labs and imaging studies  CBC: Recent Labs  Lab 06/20/22 1343 06/20/22 1530 06/21/22 0015 06/21/22 0347  WBC 5.4 6.8 6.4 7.1  NEUTROABS  --  5.0  --   --   HGB 14.2 14.8 12.4 11.6*  HCT 42.9 43.6 37.5 35.5*  MCV 88.6 87.6 89.7 90.6  PLT 199 197 172 [id number]   Basic Metabolic Panel: Recent Labs  Lab 06/20/22 1343 06/20/22 1355 06/21/22 0015 06/21/22 0347  NA 132*  --   --  138  K 4.4  --   --  3.5  CL 92*  --   --  106  CO2 27  --   --  25  GLUCOSE 726*  --   --  272*  BUN 32*  --   --  20  CREATININE 0.92  --  0.82 0.69  CALCIUM 9.3  --   --  8.2*  MG  --  2.1 1.7  --   PHOS  --  3.8 2.4*  --    GFR: Estimated Creatinine Clearance: 60.1 mL/min (by C-G formula based on SCr of 0.69 mg/dL). Liver Function Tests: Recent Labs  Lab 06/20/22 1355 06/21/22 0347  AST 20 19  ALT 18 13  ALKPHOS 74 45  BILITOT 0.8 0.7  PROT 7.0 4.9*  ALBUMIN 3.7 2.5*   Recent Labs  Lab 06/20/22 1355  LIPASE 57*   Recent Labs  Lab 06/20/22 1355  AMMONIA 14   Coagulation Profile: No results for input(s): "INR", "PROTIME" in the last 168 hours. Cardiac Enzymes: No results for input(s): "CKTOTAL", "CKMB", "CKMBINDEX", "TROPONINI" in the last 168 hours. BNP (last 3 results) No results for input(s): "PROBNP" in the last 8760 hours. HbA1C: No results for input(s): "HGBA1C" in the last 72  hours. CBG: Recent Labs  Lab 06/20/22 1721 06/20/22 2355 06/21/22 0120 06/21/22 0751 06/21/22 1244  GLUCAP 354* 288* 332* 289* 295*   Lipid Profile: No results for input(s): "CHOL", "HDL", "LDLCALC", "TRIG", "CHOLHDL", "LDLDIRECT" in the last 72 hours. Thyroid Function Tests: Recent Labs    06/21/22 0015  TSH 2.674   Anemia Panel: No results for input(s): "VITAMINB12", "FOLATE", "FERRITIN", "TIBC", "IRON", "RETICCTPCT" in the last 72 hours. Sepsis Labs: Recent Labs  Lab 06/20/22 1355 06/20/22 1630 06/21/22 0018 06/21/22 0347  LATICACIDVEN 1.5 2.8* 1.3 1.1    Recent Results (from the past 240 hour(s))  Resp panel by RT-PCR (RSV, Flu A&B, Covid) Anterior Nasal Swab  Status: None   Collection Time: 06/20/22  2:35 PM   Specimen: Anterior Nasal Swab  Result Value Ref Range Status   SARS Coronavirus 2 by RT PCR NEGATIVE NEGATIVE Final    Comment: (NOTE) SARS-CoV-2 target nucleic acids are NOT DETECTED.  The SARS-CoV-2 RNA is generally detectable in upper respiratory specimens during the acute phase of infection. The lowest concentration of SARS-CoV-2 viral copies this assay can detect is 138 copies/mL. A negative result does not preclude SARS-Cov-2 infection and should not be used as the sole basis for treatment or other patient management decisions. A negative result may occur with  improper specimen collection/handling, submission of specimen other than nasopharyngeal swab, presence of viral mutation(s) within the areas targeted by this assay, and inadequate number of viral copies(<138 copies/mL). A negative result must be combined with clinical observations, patient history, and epidemiological information. The expected result is Negative.  Fact Sheet for Patients:  EntrepreneurPulse.com.au  Fact Sheet for Healthcare Providers:  IncredibleEmployment.be  This test is no t yet approved or cleared by the [place] FDA and   has been authorized for detection and/or diagnosis of SARS-CoV-2 by FDA under an Emergency Use Authorization (EUA). This EUA will remain  in effect (meaning this test can be used) for the duration of the COVID-19 declaration under Section 564(b)(1) of the Act, 21 U.S.C.section 360bbb-3(b)(1), unless the authorization is terminated  or revoked sooner.       Influenza A by PCR NEGATIVE NEGATIVE Final   Influenza B by PCR NEGATIVE NEGATIVE Final    Comment: (NOTE) The Xpert Xpress SARS-CoV-2/FLU/RSV plus assay is intended as an aid in the diagnosis of influenza from Nasopharyngeal swab specimens and should not be used as a sole basis for treatment. Nasal washings and aspirates are unacceptable for Xpert Xpress SARS-CoV-2/FLU/RSV testing.  Fact Sheet for Patients: EntrepreneurPulse.com.au  Fact Sheet for Healthcare Providers: IncredibleEmployment.be  This test is not yet approved or cleared by the [place] FDA and has been authorized for detection and/or diagnosis of SARS-CoV-2 by FDA under an Emergency Use Authorization (EUA). This EUA will remain in effect (meaning this test can be used) for the duration of the COVID-19 declaration under Section 564(b)(1) of the Act, 21 U.S.C. section 360bbb-3(b)(1), unless the authorization is terminated or revoked.     Resp Syncytial Virus by PCR NEGATIVE NEGATIVE Final    Comment: (NOTE) Fact Sheet for Patients: EntrepreneurPulse.com.au  Fact Sheet for Healthcare Providers: IncredibleEmployment.be  This test is not yet approved or cleared by the [place] FDA and has been authorized for detection and/or diagnosis of SARS-CoV-2 by FDA under an Emergency Use Authorization (EUA). This EUA will remain in effect (meaning this test can be used) for the duration of the COVID-19 declaration under Section 564(b)(1) of the Act, 21 U.S.C. section 360bbb-3(b)(1),  unless the authorization is terminated or revoked.  Performed at [facility], [place] [address] [place], [place] [address]   Culture, blood (routine x 2)     Status: None (Preliminary result)   Collection Time: 06/20/22  3:00 PM   Specimen: BLOOD  Result Value Ref Range Status   Specimen Description   Final    BLOOD LEFT ANTECUBITAL Performed at [facility] [address], [place], [place] [address]    Special Requests   Final    BOTTLES DRAWN AEROBIC AND ANAEROBIC Blood Culture adequate volume Performed at [facility] [address], [place], [place] [address]    Culture  Final    NO GROWTH < 12 HOURS Performed at [facility] [address] [place], [place] [address]    Report Status PENDING  Incomplete  Culture, blood (routine x 2)     Status: None (Preliminary result)   Collection Time: 06/20/22  3:15 PM   Specimen: BLOOD  Result Value Ref Range Status   Specimen Description   Final    BLOOD RIGHT ANTECUBITAL Performed at [facility] [address], [place], [place] [address]    Special Requests   Final    BOTTLES DRAWN AEROBIC AND ANAEROBIC Blood Culture adequate volume Performed at [facility] [address] [place], [place] [address]    Culture   Final    NO GROWTH < 12 HOURS Performed at [facility] [address], [place], [place] [address]    Report Status PENDING  Incomplete    Radiology Studies: ECHOCARDIOGRAM COMPLETE  Result Date: 06/21/2022    ECHOCARDIOGRAM REPORT   Patient Name:   GLENADINE DRIGGERS Date of Exam: 06/21/2022 Medical Rec #:  [id number]      Height:       64.0 in Accession #:    [id number]     Weight:       175.0 lb Date of Birth:  13-Oct-1945       BSA:          1.848 m Patient Age:    57 years       BP:           65/48 mmHg Patient Gender: F              HR:           123 bpm. Exam Location:  Inpatient Procedure: 2D Echo, Cardiac  Doppler and Color Doppler Indications:    CHF Acute Diastolic  History:        Patient has prior history of Echocardiogram examinations, most                 recent 01/17/2021. CHF, CAD, Arrythmias:Atrial Fibrillation and                 Tachycardia, Signs/Symptoms:Edema, Fatigue and Hypotension; Risk                 Factors:Diabetes and Hypertension.  Sonographer:    Eartha Inch Referring Phys: [id number] SARA-MAIZ A THOMAS  Sonographer Comments: Technically difficult study due to poor echo windows. Image acquisition challenging due to patient body habitus and Image acquisition challenging due to respiratory motion. IMPRESSIONS  1. Left ventricular ejection fraction, by estimation, is 60 to 65%. The left ventricle has normal function. The left ventricle has no regional wall motion abnormalities. Left ventricular diastolic parameters are consistent with Grade I diastolic dysfunction (impaired relaxation).  2. Right ventricular systolic function is normal. The right ventricular size is normal. Tricuspid regurgitation signal is inadequate for assessing PA pressure.  3. The mitral valve is normal in structure. No evidence of mitral valve regurgitation.  4. The aortic valve is tricuspid. There is mild calcification of the aortic valve. Aortic valve regurgitation is not visualized. Aortic valve sclerosis is present, with no evidence of aortic valve stenosis. FINDINGS  Left Ventricle: Left ventricular ejection fraction, by estimation, is 60 to 65%. The left ventricle has normal function. The left ventricle has no regional wall motion abnormalities. The left ventricular internal cavity size was normal in size. There is  no left ventricular  hypertrophy. Left ventricular diastolic parameters are consistent with Grade I diastolic dysfunction (impaired relaxation). Indeterminate filling pressures. Right Ventricle: The right ventricular size is normal. No increase in right ventricular wall thickness. Right ventricular systolic  function is normal. Tricuspid regurgitation signal is inadequate for assessing PA pressure. Left Atrium: Left atrial size was normal in size. Right Atrium: Right atrial size was normal in size. Pericardium: There is no evidence of pericardial effusion. Mitral Valve: The mitral valve is normal in structure. No evidence of mitral valve regurgitation. MV peak gradient, 3.6 mmHg. The mean mitral valve gradient is 2.0 mmHg. Tricuspid Valve: The tricuspid valve is normal in structure. Tricuspid valve regurgitation is mild. Aortic Valve: The aortic valve is tricuspid. There is mild calcification of the aortic valve. Aortic valve regurgitation is not visualized. Aortic valve sclerosis is present, with no evidence of aortic valve stenosis. Pulmonic Valve: The pulmonic valve was normal in structure. Pulmonic valve regurgitation is not visualized. Aorta: The aortic root is normal in size and structure. IAS/Shunts: The interatrial septum was not well visualized.  LEFT VENTRICLE PLAX 2D LVOT diam:     2.20 cm     Diastology LV SV:         78          LV e' medial:    4.05 cm/s LV SV Index:   42          LV E/e' medial:  14.6 LVOT Area:     3.80 cm    LV e' lateral:   6.32 cm/s                            LV E/e' lateral: 9.4  LV Volumes (MOD) LV vol d, MOD A2C: 67.0 ml LV vol d, MOD A4C: 95.4 ml LV vol s, MOD A2C: 29.7 ml LV vol s, MOD A4C: 32.9 ml LV SV MOD A2C:     37.3 ml LV SV MOD A4C:     95.4 ml LV SV MOD BP:      52.1 ml RIGHT VENTRICLE             IVC RV S prime:     10.60 cm/s  IVC diam: 1.50 cm TAPSE (M-mode): 1.9 cm LEFT ATRIUM             Index        RIGHT ATRIUM           Index LA Vol (A2C):   49.0 ml 26.51 ml/m  RA Area:     15.90 cm LA Vol (A4C):   35.9 ml 19.42 ml/m  RA Volume:   40.10 ml  21.69 ml/m LA Biplane Vol: 45.2 ml 24.45 ml/m  AORTIC VALVE LVOT Vmax:   78.30 cm/s LVOT Vmean:  55.700 cm/s LVOT VTI:    0.205 m  AORTA Ao Root diam: 3.30 cm Ao Asc diam:  3.30 cm MITRAL VALVE               TRICUSPID  VALVE MV Area (PHT): 2.32 cm    TR Peak grad:   7.7 mmHg MV Area VTI:   3.48 cm    TR Vmax:        139.00 cm/s MV Peak grad:  3.6 mmHg MV Mean grad:  2.0 mmHg    SHUNTS MV Vmax:       0.95 m/s    Systemic VTI:  0.20 m MV Vmean:  57.8 cm/s   Systemic Diam: 2.20 cm MV Decel Time: 327 msec MV E velocity: 59.10 cm/s MV A velocity: 69.10 cm/s MV E/A ratio:  0.86 Mihai Croitoru MD Electronically signed by Sanda Klein MD Signature Date/Time: 06/21/2022/1:16:15 PM    Final    MR BRAIN WO CONTRAST  Result Date: 06/21/2022 CLINICAL DATA:  Syncope EXAM: MRI HEAD WITHOUT CONTRAST TECHNIQUE: Multiplanar, multiecho pulse sequences of the brain and surrounding structures were obtained without intravenous contrast. COMPARISON:  CT head 1 day prior FINDINGS: Brain: There is no acute intracranial hemorrhage, extra-axial fluid collection, or acute infarct There is mild parenchymal volume loss with prominence of the ventricular system and extra-axial CSF spaces. Patchy FLAIR signal abnormality in the supratentorial white matter likely reflects sequela of underlying chronic small-vessel ischemic change. 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 No cortical encephalomalacia is seen. There is a 1.1 cm extra-axial lesion overlying the lateral aspect of the left cerebellar hemisphere without mass effect on the underlying parenchyma or parenchymal edema, likely a meningioma. There is no evidence of invasion of the nearby transverse/sigmoid sinus. No other solid mass lesion is seen. The pituitary and suprasellar region are grossly unremarkable. There is no midline shift. Vascular: Choose Skull and upper cervical spine: Normal marrow signal. Sinuses/Orbits: The paranasal sinuses are clear. A right lens implant is noted. The globes and orbits are otherwise unremarkable. IMPRESSION: 1. No acute intracranial pathology. 2. 1.1 cm extra-axial lesion overlying the left cerebellar hemisphere likely reflects a  meningioma of doubtful clinical significance. Electronically Signed   By: Valetta Mole M.D.   On: 06/21/2022 11:41   CT Head Wo Contrast  Result Date: 06/20/2022 CLINICAL DATA:  Mental status change, unknown cause EXAM: CT HEAD WITHOUT CONTRAST TECHNIQUE: Contiguous axial images were obtained from the base of the skull through the vertex without intravenous contrast. RADIATION DOSE REDUCTION: This exam was performed according to the departmental dose-optimization program which includes automated exposure control, adjustment of the mA and/or kV according to patient size and/or use of iterative reconstruction technique. COMPARISON:  None Available. FINDINGS: Brain: There is periventricular white matter decreased attenuation consistent with small vessel ischemic changes. Ventricles, sulci and cisterns are prominent consistent with age related involutional changes. No acute intracranial hemorrhage, mass effect or shift. No hydrocephalus. There is a small extra-axial lesion left posterior fossa laterally that appears to be dural-based measuring 1.0 cm. This likely represents a meningioma which can be confirmed with a contrast-enhanced MRI. No associated mass effect. Vascular: No hyperdense vessel or unexpected calcification. Skull: Normal. Negative for fracture or focal lesion. Sinuses/Orbits: No acute finding. IMPRESSION: Atrophy and chronic small vessel ischemic changes. Left posterior fossa extra-axial 1 cm lesion, possibly a meningioma which can be confirmed with contrast-enhanced MRI. Otherwise no acute intracranial process identified. Electronically Signed   By: Sammie Bench M.D.   On: 06/20/2022 16:44   CT Angio Chest PE W and/or Wo Contrast  Result Date: 06/20/2022 CLINICAL DATA:  Rule out pulmonary embolus.  Elevated troponin EXAM: CT ANGIOGRAPHY CHEST WITH CONTRAST TECHNIQUE: Multidetector CT imaging of the chest was performed using the standard protocol during bolus administration of intravenous  contrast. Multiplanar CT image reconstructions and MIPs were obtained to evaluate the vascular anatomy. RADIATION DOSE REDUCTION: This exam was performed according to the departmental dose-optimization program which includes automated exposure control, adjustment of the mA and/or kV according to patient size and/or use of iterative reconstruction technique. CONTRAST:  75m OMNIPAQUE IOHEXOL 350 MG/ML SOLN  COMPARISON:  None FINDINGS: Exam detail is diminished secondary to respiratory motion artifact. Cardiovascular: Satisfactory opacification of the pulmonary arteries to the segmental level. No evidence of pulmonary embolism. Normal heart size. No pericardial effusion. Aortic atherosclerosis. Coronary artery calcifications. Mediastinum/Nodes: No enlarged mediastinal, hilar, or axillary lymph nodes. Signs of prior left axillary node dissection. Thyroid gland and trachea appear normal. Esophagus appears patulous which may reflect chronic dysmotility. Lungs/Pleura: No pleural effusion. No airspace consolidation identified no signs of interstitial edema. In the right apex there is a 1.2 x 1.2 cm part solid cystic nodule. The largest solid component measures up to 6 mm, image 56/5. Upper Abdomen: No acute abnormality. Right adrenal nodule measures 30 Hounsfield units and 1.3 cm, image 256/4. Left adrenal nodule measures 44 Hounsfield units and 1.4 cm, image 244/4. Low-attenuation large mint of the medial arm of the left adrenal gland is noted. Musculoskeletal: No chest wall abnormality. No acute or significant osseous findings. Review of the MIP images confirms the above findings. IMPRESSION: 1. No evidence for acute pulmonary embolus. 2. Part solid cystic nodule within the right apex measures up to 1.2 cm with the largest solid component measuring up to 6 mm. Follow-up non-contrast CT recommended at 3-6 months to confirm persistence. If unchanged, and solid component remains <6 mm, annual CT is recommended until 5 years  of stability has been established. If persistent these nodules should be considered highly suspicious if the solid component of the nodule is 6 mm or greater in size and enlarging. This recommendation follows the consensus statement: Guidelines for Management of Incidental Pulmonary Nodules Detected on CT Images:From the Fleischner Society 2017; published online before print (10.1148/radiol.IJ:2314499). 3. 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 Recommend follow-up adrenal washout CT in 1 year. If stable for = 1 year, no further follow-up imaging. JACR 2017 Aug; 14(8):1038-44, JCAT 2016 Mar-Apr; 40(2):194-200, Urol J 2006 Spring; 3(2):71-4. 4. Coronary artery calcifications. 5. Patulous esophagus which may reflect chronic dysmotility. 6.  Aortic Atherosclerosis (ICD10-I70.0). Electronically Signed   By: Kerby Moors M.D.   On: 06/20/2022 16:40   DG Chest Port 1 View  Result Date: 06/20/2022 CLINICAL DATA:  Acute mental status change EXAM: PORTABLE CHEST 1 VIEW COMPARISON:  None Available. FINDINGS: The heart size and mediastinal contours are within normal limits. Both lungs are clear. The visualized skeletal structures are unremarkable. IMPRESSION: No active disease. Electronically Signed   By: Dorise Bullion III M.D.   On: 06/20/2022 14:11    Scheduled Meds:  amLODipine  2.5 mg Oral Daily   atenolol  100 mg Oral Daily   cholecalciferol  1,000 Units Oral Daily   heparin  5,000 Units Subcutaneous Q8H   hydrochlorothiazide  12.5 mg Oral Daily   And   irbesartan  300 mg Oral Daily   insulin glargine-yfgn  15 Units Subcutaneous QHS   latanoprost  1 drop Both Eyes QHS   levothyroxine  112 mcg Oral Q0600   memantine  10 mg Oral BID   OLANZapine  15 mg Oral QHS   simvastatin  20 mg Oral Daily   sodium chloride flush  3 mL Intravenous Q12H   Continuous Infusions:  sodium chloride 125 mL/hr at 06/21/22 0928   cefTRIAXone (ROCEPHIN)  IV 1 g (06/21/22 1345)     LOS: 1  day    Time spent: 50 mins    Destyn Parfitt, MD [facility]   If 7PM-7AM, please contact night-coverage

## 2022-06-21 NOTE — Progress Notes (Signed)
OT Cancellation Note  Patient Details Name: SHERECE HOPPER MRN: [id number] DOB: 1945-09-22   Cancelled Treatment:    Reason Eval/Treat Not Completed: Patient at procedure or test/ unavailable Patient being transported to MRI. OT to continue to follow and check back as schedule will allow. Rennie Plowman, MS Acute Rehabilitation Department Office# [phone]  06/21/2022, 11:14 AM

## 2022-06-21 NOTE — Evaluation (Signed)
Occupational Therapy Evaluation Patient Details Name: Alexandra Lamb MRN: [id number] DOB: 24-Dec-1945 Today's Date: 06/21/2022   History of Present Illness Patient is a 77 year old female who presented on 3/2 after an unresponsive episode in dinning room. Patient was admitted with syncope v.s. metabolic encephalopathy, hyperglycemic crisis and UTI. PMH: schizophrenia, L breast cancer, DM, HTN, L mastectomy   Clinical Impression   Patient is a 77 year old female who was admitted for above. Patient lived at [place] prior level at [facility] level. Currently, patient needed min A for functional mobility with LOB noted with turns with poor safety awareness. Patient was max A for Lb dressing tasks with increased time as well.patient was back and forth with assist levels provided at ALF.  Patient was noted to have decreased functional activity tolerance, decreased endurance, decreased standing balance, decreased safety awareness, and decreased knowledge of AD/AE impacting participation in ADLs. Patient would continue to benefit from skilled OT services at this time while admitted and after d/c to address noted deficits in order to improve overall safety and independence in ADLs.        Recommendations for follow up therapy are one component of a multi-disciplinary discharge planning process, led by the attending physician.  Recommendations may be updated based on patient status, additional functional criteria and insurance authorization.   Follow Up Recommendations  Home health OT     Assistance Recommended at Discharge Frequent or constant Supervision/Assistance  Patient can return home with the following A little help with walking and/or transfers;A lot of help with bathing/dressing/bathroom;Assistance with cooking/housework;Direct supervision/assist for medications management;Assist for transportation;Help with stairs or ramp for entrance;Direct supervision/assist for financial management    Functional Status  Assessment  Patient has had a recent decline in their functional status and demonstrates the ability to make significant improvements in function in a reasonable and predictable amount of time.  Equipment Recommendations  None recommended by OT       Precautions / Restrictions Precautions Precautions: Fall Precaution Comments: monitor O2 and HR Restrictions Weight Bearing Restrictions: No      Mobility Bed Mobility Overal bed mobility: Needs Assistance Bed Mobility: Supine to Sit     Supine to sit: Mod assist     General bed mobility comments: with increased cues and physical A to transition into upright sitting on EOB.        Balance Overall balance assessment: Needs assistance Sitting-balance support: Feet supported Sitting balance-Leahy Scale: Fair     Standing balance support: Bilateral upper extremity supported, Reliant on assistive device for balance Standing balance-Leahy Scale: Poor       ADL either performed or assessed with clinical judgement   ADL Overall ADL's : Needs assistance/impaired Eating/Feeding: Supervision/ safety;Sitting   Grooming: Set up;Sitting   Upper Body Bathing: Minimal assistance;Sitting   Lower Body Bathing: Maximal assistance;Sitting/lateral leans   Upper Body Dressing : Min guard;Sitting   Lower Body Dressing: Maximal assistance;Sit to/from stand Lower Body Dressing Details (indicate cue type and reason): patient was noted to be unsteady wtih attempts to participate in hygiene tasks. Toilet Transfer: Minimal assistance;Ambulation;Rolling walker (2 wheels) Toilet Transfer Details (indicate cue type and reason): patient was noted to have BUE tremorlike movements with holding RW. patient noted to have LOB with attempts at turning with increased cues and A for safety to turn walker. patient was noted to have some increased anxiousness with turns with walker. patient when provided cues would report "yes that is what she tells me". but  not  stop to listen to cues. Toileting- Clothing Manipulation and Hygiene: Maximal assistance;Sit to/from stand Toileting - Clothing Manipulation Details (indicate cue type and reason): with physical A to maintain balance. when patient attempted to wipe bottom, patient then reported that this is something staff helps her with at [employer]. patient also indicated wearing absorbent undergarments.             Vision Baseline Vision/History: 1 Wears glasses              Pertinent Vitals/Pain Pain Assessment Pain Assessment: No/denies pain        Extremity/Trunk Assessment Upper Extremity Assessment Upper Extremity Assessment: Overall WFL for tasks assessed   Lower Extremity Assessment Lower Extremity Assessment: Defer to PT evaluation   Cervical / Trunk Assessment Cervical / Trunk Assessment: Normal   Communication Communication Communication: HOH   Cognition Arousal/Alertness: Awake/alert Behavior During Therapy: Flat affect Overall Cognitive Status: No family/caregiver present to determine baseline cognitive functioning           General Comments: patient was noted to have some garbled words at times. unclear if baseline as patient does not understand the question when asked. patient was unable to name the ALF that she lives at. was back and fort with PLOF information during session. no family in room                [place] expects to be discharged to:: Assisted living             Home Equipment: Rolling Walker (2 wheels)          Prior Functioning/Environment Prior Level of Function : Needs assist               ADLs Comments: patient reported using RW for functional mobility. patient reported being independent in ADLs but having help for showers each week.later in session, patient reported having assistance for ADLs from staff like dressing and toileting tasks.        OT Problem List: Decreased strength;Decreased activity  tolerance;Impaired balance (sitting and/or standing);Decreased coordination;Decreased knowledge of precautions;Decreased safety awareness;Decreased knowledge of use of DME or AE      OT Treatment/Interventions: Self-care/ADL training;Energy conservation;Therapeutic exercise;DME and/or AE instruction;Therapeutic activities;Patient/family education;Balance training    OT Goals(Current goals can be found in the care plan section) Acute Rehab OT Goals Patient Stated Goal: none stated OT Goal Formulation: Patient unable to participate in goal setting Time For Goal Achievement: 07/05/22 Potential to Achieve Goals: Fair  OT Frequency: Min 2X/week    Co-evaluation PT/OT/SLP Co-Evaluation/Treatment: Yes Reason for Co-Treatment: To address functional/ADL transfers PT goals addressed during session: Mobility/safety with mobility OT goals addressed during session: ADL's and self-care      AM-PAC OT "6 Clicks" Daily Activity     Outcome Measure Help from another person eating meals?: A Little Help from another person taking care of personal grooming?: A Little Help from another person toileting, which includes using toliet, bedpan, or urinal?: A Lot Help from another person bathing (including washing, rinsing, drying)?: A Lot Help from another person to put on and taking off regular upper body clothing?: A Little Help from another person to put on and taking off regular lower body clothing?: A Lot 6 Click Score: 15   End of Session Equipment Utilized During Treatment: Gait belt;Rolling walker (2 wheels) Nurse Communication: Other (comment) (ok to see patient)  Activity Tolerance: Patient tolerated treatment well Patient left: in chair;with call bell/phone within reach;with chair alarm set  OT Visit Diagnosis: Unsteadiness on feet (R26.81);Other abnormalities of gait and mobility (R26.89);Muscle weakness (generalized) (M62.81)                Time: [id number] OT Time Calculation (min): 29  min Charges:  OT General Charges $OT Visit: 1 Visit OT Evaluation $OT Eval Moderate Complexity: 1 Mod  Alexandra Lamb OTR/L, MS Acute Rehabilitation Department Office# [phone]   Alexandra Lamb 06/21/2022, 3:13 PM

## 2022-06-21 NOTE — Progress Notes (Signed)
Pts blood sugar 410. No order for sliding scale. Pt asymptomatic. MD and charge nurse notified

## 2022-06-22 DIAGNOSIS — R55 Syncope and collapse: Secondary | ICD-10-CM | POA: Diagnosis not present

## 2022-06-22 LAB — GLUCOSE, CAPILLARY
Glucose-Capillary: 90 mg/dL (ref 70–99)
Glucose-Capillary: 219 mg/dL — ABNORMAL HIGH (ref 70–99)
Glucose-Capillary: 230 mg/dL — ABNORMAL HIGH (ref 70–99)
Glucose-Capillary: 219 mg/dL — ABNORMAL HIGH (ref 70–99)

## 2022-06-22 LAB — HEMOGLOBIN A1C
Hgb A1c MFr Bld: 14.3 % — ABNORMAL HIGH (ref 4.8–5.6)
Mean Plasma Glucose: 364 mg/dL

## 2022-06-22 NOTE — TOC Initial Note (Signed)
Transition of Care [facility]) - Initial/Assessment Note    Patient Details  Name: Alexandra Lamb MRN: [id number] Date of Birth: [birth year]  Transition of Care [facility]) CM/SW Contact:    Illene Regulus, LCSW Phone Number: 06/22/2022, 2:39 PM  Clinical Narrative:                 CSW spoke with pt's LG Anson Fret, about home health recommendations. She reported pt is from [facility] , she stated pt can receive [facility] services there, and to contact the facility. Pt's LG reported she would transport pt back to ALF. She repots she will be here at 10 in the morning.  CSW attempted to contact facility no response, left HIPAA complaint VM.TOC to follow.   Expected Discharge Plan: [place] Barriers to Discharge: Continued Medical Work up   Patient Goals and CMS Choice Patient states their goals for this hospitalization and ongoing recovery are:: LG stated to return to ALF CMS Medicare.gov Compare Post Acute Care list provided to:: Legal Guardian Choice offered to / list presented to : [place] / Guardian      Expected Discharge Plan and Services                                              Prior Living Arrangements/Services   Lives with:: Self   Do you feel safe going back to the place where you live?: Yes      Need for Family Participation in Patient Care: No (Comment) Care giver support system in place?: No (comment)   Criminal Activity/Legal Involvement Pertinent to Current Situation/Hospitalization: No - Comment as needed  Activities of Daily Living      Permission Sought/Granted                  Emotional Assessment              Admission diagnosis:  Syncope [R55] Hyperglycemia [R73.9] Acute cystitis without hematuria [N30.00] Syncope, unspecified syncope type [R55] Patient Active Problem List   Diagnosis Date Noted   Syncope 06/20/2022   Cognitive impairment 02/19/2020   Breast cancer of upper-outer quadrant of left  female breast ([place]) 05/14/2011   Vitamin D deficiency 123XX123   DIASTOLIC HEART FAILURE, CHRONIC 07/10/2009   DIABETES MELLITUS, TYPE II 06/18/2009   HYPERLIPIDEMIA 06/18/2009   SCHIZOPHRENIA 06/18/2009   HYPERTENSION 06/18/2009   MI 06/18/2009   RESPIRATORY FAILURE, ACUTE 06/18/2009   DYSPNEA ON EXERTION 06/18/2009   PCP:  Harlan Stains, MD Pharmacy:   [facility], [place] [address] Phone: [phone] Fax: [phone]  [facility] [phone] - [place], [place] [address] [place] [address] Phone: [phone] Fax: [phone]     Social Determinants of Health ([place]) Social History: SDOH Screenings   Tobacco Use: Medium Risk (06/20/2022)   SDOH Interventions:     Readmission Risk Interventions     No data to display

## 2022-06-22 NOTE — Inpatient Diabetes Management (Signed)
Inpatient Diabetes Program Recommendations  AACE/ADA: New Consensus Statement on Inpatient Glycemic Control (2015)  Target Ranges:  Prepandial:   less than 140 mg/dL      Peak postprandial:   less than 180 mg/dL (1-2 hours)      Critically ill patients:  140 - 180 mg/dL   Lab Results  Component Value Date   GLUCAP 90 06/22/2022    Review of Glycemic Control  Latest Reference Range & Units 06/21/22 12:44 06/21/22 17:48 06/21/22 19:16 06/21/22 19:46 06/22/22 08:07  Glucose-Capillary 70 - 99 mg/dL 295 (H) 410 (H) 379 (H) 326 (H) 90  (H): Data is abnormally high Diabetes history: DM 2 Outpatient Diabetes medications: Trulicity 0.5 mg Weekly Current orders for Inpatient glycemic control:  Novolog 0-15 units TID: Semglee 15 units qhs   Inpatient Diabetes Program Recommendations:     -Consider adding Novolog 3 units TID (assuming patient is consuming >50% of meals)  Thanks, Bronson Curb, MSN, RNC-OB Diabetes Coordinator [phone] (8a-5p)

## 2022-06-22 NOTE — TOC Initial Note (Signed)
Transition of Care [facility]) - Initial/Assessment Note    Patient Details  Name: Alexandra Lamb MRN: [id number] Date of Birth: [birth year]  Transition of Care [facility]) CM/SW Contact:    Illene Regulus, LCSW Phone Number: 06/22/2022, 12:39 PM  Clinical Narrative:                  CSW attempted to contact pt's LG to discuss home health services. Left VM requesting return call. TOC to follow.       Patient Goals and CMS Choice            Expected Discharge Plan and Services                                              Prior Living Arrangements/Services                       Activities of Daily Living      Permission Sought/Granted                  Emotional Assessment              Admission diagnosis:  Syncope [R55] Hyperglycemia [R73.9] Acute cystitis without hematuria [N30.00] Syncope, unspecified syncope type [R55] Patient Active Problem List   Diagnosis Date Noted   Syncope 06/20/2022   Cognitive impairment 02/19/2020   Breast cancer of upper-outer quadrant of left female breast ([place]) 05/14/2011   Vitamin D deficiency 123XX123   DIASTOLIC HEART FAILURE, CHRONIC 07/10/2009   DIABETES MELLITUS, TYPE II 06/18/2009   HYPERLIPIDEMIA 06/18/2009   SCHIZOPHRENIA 06/18/2009   HYPERTENSION 06/18/2009   MI 06/18/2009   RESPIRATORY FAILURE, ACUTE 06/18/2009   DYSPNEA ON EXERTION 06/18/2009   PCP:  Harlan Stains, MD Pharmacy:   [facility], [place] [address] Phone: [phone] Fax: [phone]  [facility] [phone] - [place], [place] - [place] [address] Phone: [phone] Fax: [phone]     Social Determinants of Health ([place]) Social History: SDOH Screenings   Tobacco Use: Medium Risk (06/20/2022)   SDOH Interventions:      Readmission Risk Interventions     No data to display

## 2022-06-22 NOTE — Plan of Care (Signed)
  Problem: Activity: Goal: Risk for activity intolerance will decrease Outcome: Progressing   Problem: Coping: Goal: Level of anxiety will decrease Outcome: Progressing   Problem: Elimination: Goal: Will not experience complications related to urinary retention Outcome: Progressing   Problem: Pain Managment: Goal: General experience of comfort will improve Outcome: Progressing   Problem: Safety: Goal: Ability to remain free from injury will improve Outcome: Progressing

## 2022-06-22 NOTE — Progress Notes (Signed)
PROGRESS NOTE    Alexandra Lamb  [id number] DOB: 1945/07/20 DOA: 06/20/2022  PCP: Harlan Stains, MD   Brief Narrative:  This 77 years old female with PMH significant for schizophrenia, DM 2, hyperlipidemia, hypertension, hypothyroidism presented from the assisted living facility due to an episode of unresponsiveness. EMS reports that she was sitting in the dining room and slumped over and had a period of unresponsiveness.  EMS on arrival Patient was alert and noted to be hallucinating but able to follow commands.  She was also noted to have very elevated sugars.  Workup in the ED reveals UA shows positive LE, rare bacteria,  lactic acid 1.5, lipase 57, BNP 253, chest x-ray no acute deformity.  Respiratory viral panel negative.  CT head left posterior fossa lesion possibly meningioma which can be confirmed with contrast-enhanced MRI.CT chest PE protocol negative for PE.  Patient is admitted for further workup.  Assessment & Plan:   Principal Problem:   Syncope  Acute metabolic encephalopathy: This could be syncope versus metabolic encephalopathy. Patient was sitting in the dining room and slumped over and had a period of unresponsiveness. Differentials include orthostatics /electrolyte derangement / UTI CT head negative for acute abnormality. UA; showed small LE, nitrite negative. F/u urine culture. Frequent neuro checks MRI Head: No acute intracranial pathology.  Finding likely meningioma of doubtful clinical significance. Consider Neuro consult if patient continues to remain confused. 2D Echo: Showed LVEF 60 to 65%, no RWMA. Mental status has significantly improved.  Back to baseline.   Hyperglycemic crisis: Continue IV fluid resuscitation. Continue regular insulin sliding scale Continue Lantus 15 units and titrate as needed. Carb modified diet.  Hypothyroidism: Continue Synthroid.  Schizophrenia, paranoid type: Patient does not have hallucinations at baseline. Valproic  level slightly elevated. -will hold for now , will need repeat level prior to restarting  -resume home regimen as able.   Hyperlipidemia: Continue statin   Hypertension Continue metoprolol,  amlodipine and Valsartan   UTI: Continue ceftriaxone 1 g daily Follow-up urine culture   Brain lesion Left posterior fossa  MRI brain finding consistent with meningioma of nonclinical significance   Lung nodule  will need f/u repeat ct in 3-6 mo     DVT prophylaxis: Lovenox Code Status: Full code Family Communication: No family at bed side. Disposition Plan:    Status is: Inpatient Remains inpatient appropriate because: Admitted for syncope likely multifactorial.  Continue IV antibiotics for UTI follow-up urine culture.  Workup so far negative.   Consultants:  None  Procedures: 2D Echo, MRI Brain Antimicrobials:  Anti-infectives (From admission, onward)    Start     Dose/Rate Route Frequency Ordered Stop   06/21/22 1500  cefTRIAXone (ROCEPHIN) 1 g in sodium chloride 0.9 % 100 mL IVPB        1 g 200 mL/hr over 30 Minutes Intravenous Every 24 hours 06/20/22 2335     06/20/22 1430  cefTRIAXone (ROCEPHIN) 1 g in sodium chloride 0.9 % 100 mL IVPB        1 g 200 mL/hr over 30 Minutes Intravenous  Once 06/20/22 1426 06/20/22 1551       Subjective: Patient was seen and examined at bedside.  Overnight events noted.   Patient reports doing better,  She is improving.  Denies any chest pain or shortness of breath or dizziness. Patient has rambling of speech at baseline.  Confirmed with Sister Arbie Cookey.  Objective: Vitals:   06/21/22 1230 06/21/22 1954 06/22/22 0513 06/22/22 1027  BP: Marland Kitchen)  165/135 (!) 167/63 (!) 167/79 (!) 162/65  Pulse:  75 78 82  Resp:      Temp:  98.2 F (36.8 C) 97.9 F (36.6 C)   TempSrc:  Oral Oral   SpO2:  100% 99%   Weight:      Height:        Intake/Output Summary (Last 24 hours) at 06/22/2022 1327 Last data filed at 06/22/2022 1045 Gross per 24 hour   Intake 744.61 ml  Output 1300 ml  Net -555.39 ml   Filed Weights   06/20/22 1250  Weight: 79.4 kg    Examination:  General exam: Appears comfortable, deconditioned, not in any distress, Speech rambling at baseline. Respiratory system: CTA bilaterally, respiratory for normal, RR 15 Cardiovascular system: S1 & S2 heard, regular rate and rhythm, no murmur. Gastrointestinal system: Abdomen is soft, non tender, non distended, BS+ Central nervous system: Alert and oriented  x 3 . No focal neurological deficits. Extremities: No edema, no cyanosis, no clubbing Skin: No rashes, lesions or ulcers Psychiatry: Judgement and insight appear normal. Mood & affect appropriate.     Data Reviewed: I have personally reviewed following labs and imaging studies  CBC: Recent Labs  Lab 06/20/22 1343 06/20/22 1530 06/21/22 0015 06/21/22 0347  WBC 5.4 6.8 6.4 7.1  NEUTROABS  --  5.0  --   --   HGB 14.2 14.8 12.4 11.6*  HCT 42.9 43.6 37.5 35.5*  MCV 88.6 87.6 89.7 90.6  PLT 199 197 172 [id number]   Basic Metabolic Panel: Recent Labs  Lab 06/20/22 1343 06/20/22 1355 06/21/22 0015 06/21/22 0347  NA 132*  --   --  138  K 4.4  --   --  3.5  CL 92*  --   --  106  CO2 27  --   --  25  GLUCOSE 726*  --   --  272*  BUN 32*  --   --  20  CREATININE 0.92  --  0.82 0.69  CALCIUM 9.3  --   --  8.2*  MG  --  2.1 1.7  --   PHOS  --  3.8 2.4*  --    GFR: Estimated Creatinine Clearance: 60.1 mL/min (by C-G formula based on SCr of 0.69 mg/dL). Liver Function Tests: Recent Labs  Lab 06/20/22 1355 06/21/22 0347  AST 20 19  ALT 18 13  ALKPHOS 74 45  BILITOT 0.8 0.7  PROT 7.0 4.9*  ALBUMIN 3.7 2.5*   Recent Labs  Lab 06/20/22 1355  LIPASE 57*   Recent Labs  Lab 06/20/22 1355  AMMONIA 14   Coagulation Profile: No results for input(s): "INR", "PROTIME" in the last 168 hours. Cardiac Enzymes: No results for input(s): "CKTOTAL", "CKMB", "CKMBINDEX", "TROPONINI" in the last 168 hours. BNP  (last 3 results) No results for input(s): "PROBNP" in the last 8760 hours. HbA1C: Recent Labs    06/21/22 0018  HGBA1C 14.3*   CBG: Recent Labs  Lab 06/21/22 1748 06/21/22 1916 06/21/22 1946 06/22/22 0807 06/22/22 1210  GLUCAP 410* 379* 326* 90 219*   Lipid Profile: No results for input(s): "CHOL", "HDL", "LDLCALC", "TRIG", "CHOLHDL", "LDLDIRECT" in the last 72 hours. Thyroid Function Tests: Recent Labs    06/21/22 0015  TSH 2.674   Anemia Panel: No results for input(s): "VITAMINB12", "FOLATE", "FERRITIN", "TIBC", "IRON", "RETICCTPCT" in the last 72 hours. Sepsis Labs: Recent Labs  Lab 06/20/22 1355 06/20/22 1630 06/21/22 0018 06/21/22 0347  LATICACIDVEN 1.5 2.8* 1.3 1.1  Recent Results (from the past 240 hour(s))  Resp panel by RT-PCR (RSV, Flu A&B, Covid) Anterior Nasal Swab     Status: None   Collection Time: 06/20/22  2:35 PM   Specimen: Anterior Nasal Swab  Result Value Ref Range Status   SARS Coronavirus 2 by RT PCR NEGATIVE NEGATIVE Final    Comment: (NOTE) SARS-CoV-2 target nucleic acids are NOT DETECTED.  The SARS-CoV-2 RNA is generally detectable in upper respiratory specimens during the acute phase of infection. The lowest concentration of SARS-CoV-2 viral copies this assay can detect is 138 copies/mL. A negative result does not preclude SARS-Cov-2 infection and should not be used as the sole basis for treatment or other patient management decisions. A negative result may occur with  improper specimen collection/handling, submission of specimen other than nasopharyngeal swab, presence of viral mutation(s) within the areas targeted by this assay, and inadequate number of viral copies(<138 copies/mL). A negative result must be combined with clinical observations, patient history, and epidemiological information. The expected result is Negative.  Fact Sheet for Patients:  EntrepreneurPulse.com.au  Fact Sheet for Healthcare  Providers:  IncredibleEmployment.be  This test is no t yet approved or cleared by the [place] FDA and  has been authorized for detection and/or diagnosis of SARS-CoV-2 by FDA under an Emergency Use Authorization (EUA). This EUA will remain  in effect (meaning this test can be used) for the duration of the COVID-19 declaration under Section 564(b)(1) of the Act, 21 U.S.C.section 360bbb-3(b)(1), unless the authorization is terminated  or revoked sooner.       Influenza A by PCR NEGATIVE NEGATIVE Final   Influenza B by PCR NEGATIVE NEGATIVE Final    Comment: (NOTE) The Xpert Xpress SARS-CoV-2/FLU/RSV plus assay is intended as an aid in the diagnosis of influenza from Nasopharyngeal swab specimens and should not be used as a sole basis for treatment. Nasal washings and aspirates are unacceptable for Xpert Xpress SARS-CoV-2/FLU/RSV testing.  Fact Sheet for Patients: EntrepreneurPulse.com.au  Fact Sheet for Healthcare Providers: IncredibleEmployment.be  This test is not yet approved or cleared by the [place] FDA and has been authorized for detection and/or diagnosis of SARS-CoV-2 by FDA under an Emergency Use Authorization (EUA). This EUA will remain in effect (meaning this test can be used) for the duration of the COVID-19 declaration under Section 564(b)(1) of the Act, 21 U.S.C. section 360bbb-3(b)(1), unless the authorization is terminated or revoked.     Resp Syncytial Virus by PCR NEGATIVE NEGATIVE Final    Comment: (NOTE) Fact Sheet for Patients: EntrepreneurPulse.com.au  Fact Sheet for Healthcare Providers: IncredibleEmployment.be  This test is not yet approved or cleared by the [place] FDA and has been authorized for detection and/or diagnosis of SARS-CoV-2 by FDA under an Emergency Use Authorization (EUA). This EUA will remain in effect (meaning this test can be  used) for the duration of the COVID-19 declaration under Section 564(b)(1) of the Act, 21 U.S.C. section 360bbb-3(b)(1), unless the authorization is terminated or revoked.  Performed at [facility], [place] [address] [place], [place] [address]   Culture, blood (routine x 2)     Status: None (Preliminary result)   Collection Time: 06/20/22  3:00 PM   Specimen: BLOOD  Result Value Ref Range Status   Specimen Description   Final    BLOOD LEFT ANTECUBITAL Performed at [facility] [address], [place], [place] [address]    Special Requests   Final    BOTTLES DRAWN AEROBIC  AND ANAEROBIC Blood Culture adequate volume Performed at [facility] [address] [place], [place] [address]    Culture   Final    NO GROWTH 2 DAYS Performed at [facility] [address] [place], [place] [address]    Report Status PENDING  Incomplete  Culture, blood (routine x 2)     Status: None (Preliminary result)   Collection Time: 06/20/22  3:15 PM   Specimen: BLOOD  Result Value Ref Range Status   Specimen Description   Final    BLOOD RIGHT ANTECUBITAL Performed at [facility] [address], [place], [place] [address]    Special Requests   Final    BOTTLES DRAWN AEROBIC AND ANAEROBIC Blood Culture adequate volume Performed at [facility] [name], [place], [place] [address]    Culture   Final    NO GROWTH 2 DAYS Performed at [facility] [address] [place], [place] [address]    Report Status PENDING  Incomplete  Urine Culture (for pregnant, neutropenic or urologic patients or patients with an indwelling urinary catheter)     Status: None (Preliminary result)   Collection Time: 06/20/22 11:40 PM   Specimen: Urine, Clean Catch  Result Value Ref Range Status   Specimen Description   Final    URINE, CLEAN CATCH Performed at [facility], [place]  [address], [place], [place] [address]    Special Requests   Final    NONE Performed at [facility], [place] [address], [place], [place] [address]    Culture   Final    CULTURE REINCUBATED FOR BETTER GROWTH Performed at [facility], [place] [address] [place],  [address]    Report Status PENDING  Incomplete    Radiology Studies: ECHOCARDIOGRAM COMPLETE  Result Date: 06/21/2022    ECHOCARDIOGRAM REPORT   Patient Name:   SHANTARIA ALAVA Date of Exam: 06/21/2022 Medical Rec #:  [id number]      Height:       64.0 in Accession #:    [id number]     Weight:       175.0 lb Date of Birth:  [birth year]       BSA:          1.848 m Patient Age:    5 years       BP:           65/48 mmHg Patient Gender: F              HR:           123 bpm. Exam Location:  Inpatient Procedure: 2D Echo, Cardiac Doppler and Color Doppler Indications:    CHF Acute Diastolic  History:        Patient has prior history of Echocardiogram examinations, most                 recent 01/17/2021. CHF, CAD, Arrythmias:Atrial Fibrillation and                 Tachycardia, Signs/Symptoms:Edema, Fatigue and Hypotension; Risk                 Factors:Diabetes and Hypertension.  Sonographer:    Eartha Inch Referring Phys: [id number] SARA-MAIZ A THOMAS  Sonographer Comments: Technically difficult study due to poor echo windows. Image acquisition challenging due to patient body habitus and Image acquisition challenging due to respiratory motion. IMPRESSIONS  1. Left  ventricular ejection fraction, by estimation, is 60 to 65%. The left ventricle has normal function. The left ventricle has no regional wall motion abnormalities. Left ventricular diastolic parameters are consistent with Grade I diastolic dysfunction (impaired relaxation).  2. Right ventricular systolic function is normal. The right ventricular size is normal. Tricuspid regurgitation signal is inadequate for assessing PA pressure.  3. The mitral valve is normal in structure.  No evidence of mitral valve regurgitation.  4. The aortic valve is tricuspid. There is mild calcification of the aortic valve. Aortic valve regurgitation is not visualized. Aortic valve sclerosis is present, with no evidence of aortic valve stenosis. FINDINGS  Left Ventricle: Left ventricular ejection fraction, by estimation, is 60 to 65%. The left ventricle has normal function. The left ventricle has no regional wall motion abnormalities. The left ventricular internal cavity size was normal in size. There is  no left ventricular hypertrophy. Left ventricular diastolic parameters are consistent with Grade I diastolic dysfunction (impaired relaxation). Indeterminate filling pressures. Right Ventricle: The right ventricular size is normal. No increase in right ventricular wall thickness. Right ventricular systolic function is normal. Tricuspid regurgitation signal is inadequate for assessing PA pressure. Left Atrium: Left atrial size was normal in size. Right Atrium: Right atrial size was normal in size. Pericardium: There is no evidence of pericardial effusion. Mitral Valve: The mitral valve is normal in structure. No evidence of mitral valve regurgitation. MV peak gradient, 3.6 mmHg. The mean mitral valve gradient is 2.0 mmHg. Tricuspid Valve: The tricuspid valve is normal in structure. Tricuspid valve regurgitation is mild. Aortic Valve: The aortic valve is tricuspid. There is mild calcification of the aortic valve. Aortic valve regurgitation is not visualized. Aortic valve sclerosis is present, with no evidence of aortic valve stenosis. Pulmonic Valve: The pulmonic valve was normal in structure. Pulmonic valve regurgitation is not visualized. Aorta: The aortic root is normal in size and structure. IAS/Shunts: The interatrial septum was not well visualized.  LEFT VENTRICLE PLAX 2D LVOT diam:     2.20 cm     Diastology LV SV:         78          LV e' medial:    4.05 cm/s LV SV Index:   42          LV E/e' medial:   14.6 LVOT Area:     3.80 cm    LV e' lateral:   6.32 cm/s                            LV E/e' lateral: 9.4  LV Volumes (MOD) LV vol d, MOD A2C: 67.0 ml LV vol d, MOD A4C: 95.4 ml LV vol s, MOD A2C: 29.7 ml LV vol s, MOD A4C: 32.9 ml LV SV MOD A2C:     37.3 ml LV SV MOD A4C:     95.4 ml LV SV MOD BP:      52.1 ml RIGHT VENTRICLE             IVC RV S prime:     10.60 cm/s  IVC diam: 1.50 cm TAPSE (M-mode): 1.9 cm LEFT ATRIUM             Index        RIGHT ATRIUM           Index LA Vol (A2C):   49.0 ml 26.51 ml/m  RA Area:     15.90 cm  LA Vol (A4C):   35.9 ml 19.42 ml/m  RA Volume:   40.10 ml  21.69 ml/m LA Biplane Vol: 45.2 ml 24.45 ml/m  AORTIC VALVE LVOT Vmax:   78.30 cm/s LVOT Vmean:  55.700 cm/s LVOT VTI:    0.205 m  AORTA Ao Root diam: 3.30 cm Ao Asc diam:  3.30 cm MITRAL VALVE               TRICUSPID VALVE MV Area (PHT): 2.32 cm    TR Peak grad:   7.7 mmHg MV Area VTI:   3.48 cm    TR Vmax:        139.00 cm/s MV Peak grad:  3.6 mmHg MV Mean grad:  2.0 mmHg    SHUNTS MV Vmax:       0.95 m/s    Systemic VTI:  0.20 m MV Vmean:      57.8 cm/s   Systemic Diam: 2.20 cm MV Decel Time: 327 msec MV E velocity: 59.10 cm/s MV A velocity: 69.10 cm/s MV E/A ratio:  0.86 Mihai Croitoru MD Electronically signed by Sanda Klein MD Signature Date/Time: 06/21/2022/1:16:15 PM    Final    MR BRAIN WO CONTRAST  Result Date: 06/21/2022 CLINICAL DATA:  Syncope EXAM: MRI HEAD WITHOUT CONTRAST TECHNIQUE: Multiplanar, multiecho pulse sequences of the brain and surrounding structures were obtained without intravenous contrast. COMPARISON:  CT head 1 day prior FINDINGS: Brain: There is no acute intracranial hemorrhage, extra-axial fluid collection, or acute infarct There is mild parenchymal volume loss with prominence of the ventricular system and extra-axial CSF spaces. Patchy FLAIR signal abnormality in the supratentorial white matter likely reflects sequela of underlying chronic small-vessel ischemic change. There is a small  remote infarct in the right cerebral peduncle. No cortical encephalomalacia is seen. There is a 1.1 cm extra-axial lesion overlying the lateral aspect of the left cerebellar hemisphere without mass effect on the underlying parenchyma or parenchymal edema, likely a meningioma. There is no evidence of invasion of the nearby transverse/sigmoid sinus. No other solid mass lesion is seen. The pituitary and suprasellar region are grossly unremarkable. There is no midline shift. Vascular: Choose Skull and upper cervical spine: Normal marrow signal. Sinuses/Orbits: The paranasal sinuses are clear. A right lens implant is noted. The globes and orbits are otherwise unremarkable. IMPRESSION: 1. No acute intracranial pathology. 2. 1.1 cm extra-axial lesion overlying the left cerebellar hemisphere likely reflects a meningioma of doubtful clinical significance. Electronically Signed   By: Valetta Mole M.D.   On: 06/21/2022 11:41   CT Head Wo Contrast  Result Date: 06/20/2022 CLINICAL DATA:  Mental status change, unknown cause EXAM: CT HEAD WITHOUT CONTRAST TECHNIQUE: Contiguous axial images were obtained from the base of the skull through the vertex without intravenous contrast. RADIATION DOSE REDUCTION: This exam was performed according to the departmental dose-optimization program which includes automated exposure control, adjustment of the mA and/or kV according to patient size and/or use of iterative reconstruction technique. COMPARISON:  None Available. FINDINGS: Brain: There is periventricular white matter decreased attenuation consistent with small vessel ischemic changes. Ventricles, sulci and cisterns are prominent consistent with age related involutional changes. No acute intracranial hemorrhage, mass effect or shift. No hydrocephalus. There is a small extra-axial lesion left posterior fossa laterally that appears to be dural-based measuring 1.0 cm. This likely represents a meningioma which can be confirmed with a  contrast-enhanced MRI. No associated mass effect. Vascular: No hyperdense vessel or unexpected calcification. Skull: Normal. Negative for fracture or  focal lesion. Sinuses/Orbits: No acute finding. IMPRESSION: Atrophy and chronic small vessel ischemic changes. Left posterior fossa extra-axial 1 cm lesion, possibly a meningioma which can be confirmed with contrast-enhanced MRI. Otherwise no acute intracranial process identified. Electronically Signed   By: Sammie Bench M.D.   On: 06/20/2022 16:44   CT Angio Chest PE W and/or Wo Contrast  Result Date: 06/20/2022 CLINICAL DATA:  Rule out pulmonary embolus.  Elevated troponin EXAM: CT ANGIOGRAPHY CHEST WITH CONTRAST TECHNIQUE: Multidetector CT imaging of the chest was performed using the standard protocol during bolus administration of intravenous contrast. Multiplanar CT image reconstructions and MIPs were obtained to evaluate the vascular anatomy. RADIATION DOSE REDUCTION: This exam was performed according to the departmental dose-optimization program which includes automated exposure control, adjustment of the mA and/or kV according to patient size and/or use of iterative reconstruction technique. CONTRAST:  13m OMNIPAQUE IOHEXOL 350 MG/ML SOLN COMPARISON:  None FINDINGS: Exam detail is diminished secondary to respiratory motion artifact. Cardiovascular: Satisfactory opacification of the pulmonary arteries to the segmental level. No evidence of pulmonary embolism. Normal heart size. No pericardial effusion. Aortic atherosclerosis. Coronary artery calcifications. Mediastinum/Nodes: No enlarged mediastinal, hilar, or axillary lymph nodes. Signs of prior left axillary node dissection. Thyroid gland and trachea appear normal. Esophagus appears patulous which may reflect chronic dysmotility. Lungs/Pleura: No pleural effusion. No airspace consolidation identified no signs of interstitial edema. In the right apex there is a 1.2 x 1.2 cm part solid cystic nodule. The  largest solid component measures up to 6 mm, image 56/5. Upper Abdomen: No acute abnormality. Right adrenal nodule measures 30 Hounsfield units and 1.3 cm, image 256/4. Left adrenal nodule measures 44 Hounsfield units and 1.4 cm, image 244/4. Low-attenuation large mint of the medial arm of the left adrenal gland is noted. Musculoskeletal: No chest wall abnormality. No acute or significant osseous findings. Review of the MIP images confirms the above findings. IMPRESSION: 1. No evidence for acute pulmonary embolus. 2. Part solid cystic nodule within the right apex measures up to 1.2 cm with the largest solid component measuring up to 6 mm. Follow-up non-contrast CT recommended at 3-6 months to confirm persistence. If unchanged, and solid component remains <6 mm, annual CT is recommended until 5 years of stability has been established. If persistent these nodules should be considered highly suspicious if the solid component of the nodule is 6 mm or greater in size and enlarging. This recommendation follows the consensus statement: Guidelines for Management of Incidental Pulmonary Nodules Detected on CT Images:From the Fleischner Society 2017; published online before print (10.1148/radiol.[id number]. 3. 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 Recommend follow-up adrenal washout CT in 1 year. If stable for = 1 year, no further follow-up imaging. JACR 2017 Aug; 14(8):1038-44, JCAT 2016 Mar-Apr; 40(2):194-200, Urol J 2006 Spring; 3(2):71-4. 4. Coronary artery calcifications. 5. Patulous esophagus which may reflect chronic dysmotility. 6.  Aortic Atherosclerosis (ICD10-I70.0). Electronically Signed   By: TKerby MoorsM.D.   On: 06/20/2022 16:40   DG Chest Port 1 View  Result Date: 06/20/2022 CLINICAL DATA:  Acute mental status change EXAM: PORTABLE CHEST 1 VIEW COMPARISON:  None Available. FINDINGS: The heart size and mediastinal contours are within normal limits. Both lungs are clear.  The visualized skeletal structures are unremarkable. IMPRESSION: No active disease. Electronically Signed   By: DDorise BullionIII M.D.   On: 06/20/2022 14:11    Scheduled Meds:  amLODipine  2.5 mg Oral Daily   atenolol  100 mg Oral Daily   cholecalciferol  1,000 Units Oral Daily   heparin  5,000 Units Subcutaneous Q8H   hydrochlorothiazide  12.5 mg Oral Daily   And   irbesartan  300 mg Oral Daily   insulin aspart  0-15 Units Subcutaneous TID WC   insulin glargine-yfgn  15 Units Subcutaneous QHS   latanoprost  1 drop Both Eyes QHS   levothyroxine  112 mcg Oral Q0600   memantine  10 mg Oral BID   OLANZapine  15 mg Oral QHS   simvastatin  20 mg Oral Daily   sodium chloride flush  3 mL Intravenous Q12H   Continuous Infusions:  cefTRIAXone (ROCEPHIN)  IV 1 g (06/21/22 1345)     LOS: 2 days    Time spent: 35 mins    Denicia Pagliarulo, MD [facility]   If 7PM-7AM, please contact night-coverage

## 2022-06-23 ENCOUNTER — Other Ambulatory Visit (HOSPITAL_COMMUNITY): Payer: Self-pay

## 2022-06-23 DIAGNOSIS — R55 Syncope and collapse: Secondary | ICD-10-CM | POA: Diagnosis not present

## 2022-06-23 DIAGNOSIS — M6281 Muscle weakness (generalized): Secondary | ICD-10-CM | POA: Diagnosis not present

## 2022-06-23 DIAGNOSIS — R2689 Other abnormalities of gait and mobility: Secondary | ICD-10-CM | POA: Diagnosis not present

## 2022-06-23 DIAGNOSIS — N3946 Mixed incontinence: Secondary | ICD-10-CM | POA: Diagnosis not present

## 2022-06-23 DIAGNOSIS — R279 Unspecified lack of coordination: Secondary | ICD-10-CM | POA: Diagnosis not present

## 2022-06-23 LAB — CBC
HCT: 40.1 % (ref 36.0–46.0)
Hemoglobin: 13.2 g/dL (ref 12.0–15.0)
MCH: 29.1 pg (ref 26.0–34.0)
MCHC: 32.9 g/dL (ref 30.0–36.0)
MCV: 88.3 fL (ref 80.0–100.0)
Platelets: 163 10*3/uL (ref 150–400)
RBC: 4.54 MIL/uL (ref 3.87–5.11)
RDW: 12.9 % (ref 11.5–15.5)
WBC: 5.9 10*3/uL (ref 4.0–10.5)
nRBC: 0 % (ref 0.0–0.2)

## 2022-06-23 LAB — URINE CULTURE: Culture: 20000 — AB

## 2022-06-23 LAB — BASIC METABOLIC PANEL
Anion gap: 12 (ref 5–15)
BUN: 19 mg/dL (ref 8–23)
CO2: 24 mmol/L (ref 22–32)
Calcium: 8.6 mg/dL — ABNORMAL LOW (ref 8.9–10.3)
Chloride: 99 mmol/L (ref 98–111)
Creatinine, Ser: 0.71 mg/dL (ref 0.44–1.00)
GFR, Estimated: >60 mL/min (ref >60)
Glucose, Bld: 249 mg/dL — ABNORMAL HIGH (ref 70–99)
Potassium: 3.3 mmol/L — ABNORMAL LOW (ref 3.5–5.1)
Sodium: 135 mmol/L (ref 135–145)

## 2022-06-23 LAB — GLUCOSE, CAPILLARY
Glucose-Capillary: 189 mg/dL — ABNORMAL HIGH (ref 70–99)
Glucose-Capillary: 178 mg/dL — ABNORMAL HIGH (ref 70–99)

## 2022-06-23 LAB — PHOSPHORUS: Phosphorus: 3.3 mg/dL (ref 2.5–4.6)

## 2022-06-23 LAB — MAGNESIUM: Magnesium: 1.5 mg/dL — ABNORMAL LOW (ref 1.7–2.4)

## 2022-06-23 MED ORDER — AMOXICILLIN 500 MG PO CAPS
500.0000 mg | ORAL_CAPSULE | Freq: Three times a day (TID) | ORAL | 0 refills | Status: DC
  Filled 2022-06-23: qty 12, 4d supply, fill #0

## 2022-06-23 MED ORDER — INSULIN STARTER KIT- PEN NEEDLES (ENGLISH)
1.0000 | Freq: Once | Status: DC
  Filled 2022-06-23: qty 1

## 2022-06-23 MED ORDER — MAGNESIUM SULFATE 2 GM/50ML IV SOLN
2.0000 g | Freq: Once | INTRAVENOUS | Status: AC
  Administered 2022-06-23: 2 g via INTRAVENOUS
  Filled 2022-06-23: qty 50

## 2022-06-23 MED ORDER — POTASSIUM CHLORIDE 20 MEQ PO PACK
40.0000 meq | PACK | Freq: Once | ORAL | Status: AC
  Administered 2022-06-23: 40 meq via ORAL
  Filled 2022-06-23: qty 2

## 2022-06-23 MED ORDER — LIVING WELL WITH DIABETES BOOK
Freq: Once | Status: DC
  Filled 2022-06-23: qty 1

## 2022-06-23 MED ORDER — CEPHALEXIN 500 MG PO CAPS
500.0000 mg | ORAL_CAPSULE | Freq: Two times a day (BID) | ORAL | 0 refills | Status: DC
  Filled 2022-06-23: qty 8, 4d supply, fill #0

## 2022-06-23 NOTE — TOC Transition Note (Addendum)
Transition of Care [facility]) - CM/SW Discharge Note   Patient Details  Name: Alexandra Lamb MRN: [id number] Date of Birth: 1946-01-30  Transition of Care [facility]) CM/SW Contact:  Illene Regulus, LCSW Phone Number: 06/23/2022, 9:39 AM   Clinical Narrative:    CSW spoke with DON at [facility], they will need FL2, d/c summary and HH orders sent over. CSW will complete Fl2 and fax over requested documents.   [place] 10:24am Pt's daughter will provided transportation, call report to [phone]. No additional TOC needs. TOC sign off.     Barriers to Discharge: Continued Medical Work up   Patient Goals and CMS Choice CMS Medicare.gov Compare Post Acute Care list provided to:: Legal Guardian Choice offered to / list presented to : [place] / [place]  Discharge Placement                         Discharge Plan and Services Additional resources added to the After Visit Summary for                                       Social Determinants of Health (SDOH) Interventions SDOH Screenings   Tobacco Use: Medium Risk (06/20/2022)     Readmission Risk Interventions     No data to display

## 2022-06-23 NOTE — NC FL2 (Signed)
[place] LEVEL OF CARE FORM     IDENTIFICATION  Patient Name: Alexandra Lamb Birthdate: 05-Jun-1945 Sex: female Admission Date (Current Location): 06/20/2022  [facility] and [place] Number:  Herbalist and Address:  [facility],  [place], [place]      Provider Number: [id number]  Attending Physician Name and Address:  Duard Brady, MD  Relative Name and Phone Number:  Matthieu,Carol (Legal Guardian) [phone] [facility])    Current Level of Care: Hospital Recommended Level of Care: Assisted Living Facility Prior Approval Number:    Date Approved/Denied:   PASRR Number:    Discharge Plan: Other (Comment) (ALF)    Current Diagnoses: Patient Active Problem List   Diagnosis Date Noted   Syncope 06/20/2022   Cognitive impairment 02/19/2020   Breast cancer of upper-outer quadrant of left female breast ([place]) 05/14/2011   Vitamin D deficiency [id number]   DIASTOLIC HEART FAILURE, CHRONIC 07/10/2009   DIABETES MELLITUS, TYPE II 06/18/2009   HYPERLIPIDEMIA 06/18/2009   SCHIZOPHRENIA 06/18/2009   HYPERTENSION 06/18/2009   MI 06/18/2009   RESPIRATORY FAILURE, ACUTE 06/18/2009   DYSPNEA ON EXERTION 06/18/2009    Orientation RESPIRATION BLADDER Height & Weight     Self  Normal Incontinent Weight: 175 lb 4.3 oz (79.5 kg) Height:  '5\' 4"'$  (162.6 cm)  BEHAVIORAL SYMPTOMS/MOOD NEUROLOGICAL BOWEL NUTRITION STATUS      Continent Diet (carb modified)  AMBULATORY STATUS COMMUNICATION OF NEEDS Skin   Supervision Verbally Normal                       Personal Care Assistance Level of Assistance  Bathing, Feeding, Dressing Bathing Assistance: Limited assistance Feeding assistance: Independent Dressing Assistance: Limited assistance     Functional Limitations Info             SPECIAL CARE FACTORS FREQUENCY                       Contractures Contractures Info: Not present    Additional Factors Info  Code  Status, Allergies Code Status Info: full Allergies Info: NKA           Current Medications (06/23/2022):  This is the current hospital active medication list Current Facility-Administered Medications  Medication Dose Route Frequency Provider Last Rate Last Admin   acetaminophen (TYLENOL) tablet 650 mg  650 mg Oral Q6H PRN Clance Boll, MD       Or   acetaminophen (TYLENOL) suppository 650 mg  650 mg Rectal Q6H PRN Clance Boll, MD       amLODipine (NORVASC) tablet 2.5 mg  2.5 mg Oral Daily Myles Rosenthal A, MD   2.5 mg at 06/22/22 1032   atenolol (TENORMIN) tablet 100 mg  100 mg Oral Daily Myles Rosenthal A, MD   100 mg at 06/22/22 1028   cefTRIAXone (ROCEPHIN) 1 g in sodium chloride 0.9 % 100 mL IVPB  1 g Intravenous Q24H Myles Rosenthal A, MD 200 mL/hr at 06/22/22 1424 1 g at 06/22/22 1424   cholecalciferol (VITAMIN D3) 25 MCG (1000 UNIT) tablet 1,000 Units  1,000 Units Oral Daily Myles Rosenthal A, MD   1,000 Units at 06/22/22 1034   heparin injection 5,000 Units  5,000 Units Subcutaneous Q8H Myles Rosenthal A, MD   5,000 Units at 06/23/22 [id number]   hydrochlorothiazide (HYDRODIURIL) tablet 12.5 mg  12.5 mg Oral Daily Valarie Merino, MD   12.5 mg at 06/22/22  1030   And   irbesartan (AVAPRO) tablet 300 mg  300 mg Oral Daily Valarie Merino, MD   300 mg at 06/22/22 1031   insulin aspart (novoLOG) injection 0-15 Units  0-15 Units Subcutaneous TID WC Duard Brady, MD   3 Units at 06/23/22 [id number]   insulin glargine-yfgn (SEMGLEE) injection 15 Units  15 Units Subcutaneous QHS Clance Boll, MD   15 Units at 06/22/22 2139   insulin starter kit- pen needles (English) 1 kit  1 kit Other Once [place], Rubye [place]aks, MD       latanoprost (XALATAN) 0.005 % ophthalmic solution 1 drop  1 drop Both Eyes QHS Clance Boll, MD   1 drop at 06/22/22 2139   levothyroxine (SYNTHROID) tablet 112 mcg  112 mcg Oral Q0600 Clance Boll, MD   112 mcg at 06/23/22 [id number]   living  well with diabetes book MISC   Does not apply Once Duard Brady, MD       magnesium sulfate IVPB 2 g 50 mL  2 g Intravenous Once Duard Brady, MD       memantine [facility]) tablet 10 mg  10 mg Oral BID Myles Rosenthal A, MD   10 mg at 06/22/22 2138   OLANZapine (ZYPREXA) tablet 15 mg  15 mg Oral QHS Myles Rosenthal A, MD   15 mg at 06/22/22 2138   ondansetron (ZOFRAN) tablet 4 mg  4 mg Oral Q6H PRN Clance Boll, MD       Or   ondansetron [facility]) injection 4 mg  4 mg Intravenous Q6H PRN Clance Boll, MD       potassium chloride (KLOR-CON) packet 40 mEq  40 mEq Oral Once Duard Brady, MD       simvastatin (ZOCOR) tablet 20 mg  20 mg Oral Daily Myles Rosenthal A, MD   20 mg at 06/22/22 1034   sodium chloride flush (NS) 0.9 % injection 3 mL  3 mL Intravenous Q12H Clance Boll, MD   3 mL at 06/22/22 2139     Discharge Medications: Please see discharge summary for a list of discharge medications.  Relevant Imaging Results:  Relevant Lab Results:   Additional Information [phone]  [place], LCSW

## 2022-06-23 NOTE — Discharge Summary (Signed)
Physician Discharge Summary  Alexandra Lamb [id number] DOB: 09-02-1945 DOA: 06/20/2022  PCP: Harlan Stains, MD  Admit date: 06/20/2022  Discharge date: 06/23/2022  Admitted From: Home  Disposition:  [employer]  Recommendations for Outpatient Follow-up:  Follow up with PCP in 1-2 weeks. Please obtain BMP/CBC in one week. Advised to take Amoxicillin 500 mg thrice daily for 4 more days for UTI. Syncope workup has been unremarkable.  Home Health:Home PT/OT Equipment/Devices:None  Discharge Condition: Stable CODE STATUS:Full code Diet recommendation: Heart Healthy   Brief [facility] Course: This 77 years old female with PMH significant for schizophrenia, DM 2, hyperlipidemia, hypertension, hypothyroidism presented from the assisted living facility due to an episode of unresponsiveness. EMS reports that she was sitting in the dining room and slumped over and had a period of unresponsiveness.  EMS on arrival Patient was alert and noted to be hallucinating but able to follow commands.  She was also noted to have very elevated sugars.  Workup in the ED reveals UA shows positive LE, rare bacteria,  lactic acid 1.5, lipase 57, BNP 253, chest x-ray no acute deformity.  Respiratory viral panel negative.  CT head left posterior fossa lesion possibly meningioma which can be confirmed with contrast-enhanced MRI. CT chest PE protocol negative for PE.  Patient was admitted for further workup.  Patient was continued on supportive care continued on IV fluid resuscitation.  She was continued on IV antibiotics.  Urine culture grew 20,000 K Enterobacter.  Antibiotic changed to amoxicillin.  MRI and  echocardiogram unremarkable.  Patient feels better,  PT and OT recommended home health services.  Patient is being discharged back to assisted living.  Home and services arranged.  Discharge Diagnoses:  Principal Problem:   Syncope  Acute metabolic encephalopathy: This could be syncope versus  metabolic encephalopathy. Patient was sitting in the dining room and slumped over and had a period of unresponsiveness. Differentials include orthostatics /electrolyte derangement / UTI CT head negative for acute abnormality. UA; showed small LE, nitrite negative. F/u urine culture. Frequent neuro checks MRI Head: No acute intracranial pathology.  Finding likely meningioma of doubtful clinical significance. Consider Neuro consult if patient continues to remain confused. 2D Echo: Showed LVEF 60 to 65%, no RWMA. Mental status has significantly improved.  Back to baseline.   Hyperglycemic crisis: Continue IV fluid resuscitation. Continue regular insulin sliding scale Continue Lantus 15 units and titrate as needed. Carb modified diet.   Hypothyroidism: Continue Synthroid.   Schizophrenia, paranoid type: Patient does not have hallucinations at baseline. Valproic level slightly elevated. -will hold for now , will need repeat level prior to restarting  -resume home regimen as able.   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 Advised to take Keflex 500 mg  twice daily for 4 more days for UTI. Syncope workup has been unremarkable.   Increase activity slowly   Complete by: As directed       Allergies as of 06/23/2022    No Known Allergies      Medication List     STOP taking these medications    losartan 100 MG tablet Commonly known as: COZAAR   multivitamin with minerals tablet       TAKE these medications    acetaminophen 500 MG tablet Commonly known as: TYLENOL Take 500 mg by mouth every 6 (six) hours as needed.   amLODipine 2.5 MG tablet Commonly known as: NORVASC Take 2.5 mg by mouth daily.   amoxicillin 500 MG capsule Commonly known as: AMOXIL Take 1 capsule (500 mg total) by mouth 3 (three) times daily for 4 days   atenolol 100 MG tablet Commonly known as: TENORMIN Take 100 mg by mouth daily.   Cholecalciferol 50 MCG (2000 UT) Caps Commonly known as: RA Vitamin D-3 Take 1 capsule (2,000 Units total) by mouth daily.   divalproex 500 MG 24 hr tablet Commonly known as: DEPAKOTE ER 1,500 mg at bedtime.   latanoprost 0.005 % ophthalmic solution Commonly known as: XALATAN Place 1 drop into both eyes at bedtime.   levothyroxine 112 MCG tablet Commonly known as: SYNTHROID Take 112 mcg by mouth daily before breakfast.   memantine 10 MG tablet Commonly known as: NAMENDA Take 10 mg by mouth 2 (two) times daily.   OLANZapine 15 MG tablet Commonly known as: ZYPREXA Take 15 mg by mouth at bedtime.   simvastatin 20 MG tablet Commonly known as: ZOCOR Take 20 mg by mouth daily.   Trulicity 4.5 0000000 Sopn Generic drug: Dulaglutide Inject 0.5 mLs into the skin every 7 (seven) days.   valsartan-hydrochlorothiazide 320-12.5 MG tablet Commonly known as: DIOVAN-HCT Take 1 tablet by mouth daily.        Follow-up Information     Harlan Stains, MD Follow up in 1 week(s).   Specialty: Family Medicine Contact information: [address], [place] [address] [phone]                No Known Allergies  Consultations: None   Procedures/Studies: ECHOCARDIOGRAM COMPLETE  Result Date: 06/21/2022    ECHOCARDIOGRAM REPORT   Patient Name:    Alexandra Lamb Date of Exam: 06/21/2022 Medical Rec #:  [id number]      Height:       64.0 in Accession #:    [id number]     Weight:       175.0 lb Date of Birth:  [birth year]       BSA:          1.848 m Patient Age:    77 years       BP:           65/48 mmHg Patient Gender: F              HR:           123 bpm. Exam Location:  Inpatient Procedure: 2D Echo, Cardiac Doppler and Color Doppler Indications:    CHF Acute Diastolic  History:        Patient has prior history of Echocardiogram examinations, most                 recent 01/17/2021. CHF, CAD, Arrythmias:Atrial Fibrillation and  Tachycardia, Signs/Symptoms:Edema, Fatigue and Hypotension; Risk                 Factors:Diabetes and Hypertension.  Sonographer:    Eartha Inch Referring Phys: [id number] SARA-MAIZ A THOMAS  Sonographer Comments: Technically difficult study due to poor echo windows. Image acquisition challenging due to patient body habitus and Image acquisition challenging due to respiratory motion. IMPRESSIONS  1. Left ventricular ejection fraction, by estimation, is 60 to 65%. The left ventricle has normal function. The left ventricle has no regional wall motion abnormalities. Left ventricular diastolic parameters are consistent with Grade I diastolic dysfunction (impaired relaxation).  2. Right ventricular systolic function is normal. The right ventricular size is normal. Tricuspid regurgitation signal is inadequate for assessing PA pressure.  3. The mitral valve is normal in structure. No evidence of mitral valve regurgitation.  4. The aortic valve is tricuspid. There is mild calcification of the aortic valve. Aortic valve regurgitation is not visualized. Aortic valve sclerosis is present, with no evidence of aortic valve stenosis. FINDINGS  Left Ventricle: Left ventricular ejection fraction, by estimation, is 60 to 65%. The left ventricle has normal function. The left ventricle has no regional wall motion abnormalities. The left  ventricular internal cavity size was normal in size. There is  no left ventricular hypertrophy. Left ventricular diastolic parameters are consistent with Grade I diastolic dysfunction (impaired relaxation). Indeterminate filling pressures. Right Ventricle: The right ventricular size is normal. No increase in right ventricular wall thickness. Right ventricular systolic function is normal. Tricuspid regurgitation signal is inadequate for assessing PA pressure. Left Atrium: Left atrial size was normal in size. Right Atrium: Right atrial size was normal in size. Pericardium: There is no evidence of pericardial effusion. Mitral Valve: The mitral valve is normal in structure. No evidence of mitral valve regurgitation. MV peak gradient, 3.6 mmHg. The mean mitral valve gradient is 2.0 mmHg. Tricuspid Valve: The tricuspid valve is normal in structure. Tricuspid valve regurgitation is mild. Aortic Valve: The aortic valve is tricuspid. There is mild calcification of the aortic valve. Aortic valve regurgitation is not visualized. Aortic valve sclerosis is present, with no evidence of aortic valve stenosis. Pulmonic Valve: The pulmonic valve was normal in structure. Pulmonic valve regurgitation is not visualized. Aorta: The aortic root is normal in size and structure. IAS/Shunts: The interatrial septum was not well visualized.  LEFT VENTRICLE PLAX 2D LVOT diam:     2.20 cm     Diastology LV SV:         78          LV e' medial:    4.05 cm/s LV SV Index:   42          LV E/e' medial:  14.6 LVOT Area:     3.80 cm    LV e' lateral:   6.32 cm/s                            LV E/e' lateral: 9.4  LV Volumes (MOD) LV vol d, MOD A2C: 67.0 ml LV vol d, MOD A4C: 95.4 ml LV vol s, MOD A2C: 29.7 ml LV vol s, MOD A4C: 32.9 ml LV SV MOD A2C:     37.3 ml LV SV MOD A4C:     95.4 ml LV SV MOD BP:      52.1 ml RIGHT VENTRICLE             IVC RV S prime:  10.60 cm/s  IVC diam: 1.50 cm TAPSE (M-mode): 1.9 cm LEFT ATRIUM             Index         RIGHT ATRIUM           Index LA Vol (A2C):   49.0 ml 26.51 ml/m  RA Area:     15.90 cm LA Vol (A4C):   35.9 ml 19.42 ml/m  RA Volume:   40.10 ml  21.69 ml/m LA Biplane Vol: 45.2 ml 24.45 ml/m  AORTIC VALVE LVOT Vmax:   78.30 cm/s LVOT Vmean:  55.700 cm/s LVOT VTI:    0.205 m  AORTA Ao Root diam: 3.30 cm Ao Asc diam:  3.30 cm MITRAL VALVE               TRICUSPID VALVE MV Area (PHT): 2.32 cm    TR Peak grad:   7.7 mmHg MV Area VTI:   3.48 cm    TR Vmax:        139.00 cm/s MV Peak grad:  3.6 mmHg MV Mean grad:  2.0 mmHg    SHUNTS MV Vmax:       0.95 m/s    Systemic VTI:  0.20 m MV Vmean:      57.8 cm/s   Systemic Diam: 2.20 cm MV Decel Time: 327 msec MV E velocity: 59.10 cm/s MV A velocity: 69.10 cm/s MV E/A ratio:  0.86 Mihai Croitoru MD Electronically signed by Sanda Klein MD Signature Date/Time: 06/21/2022/1:16:15 PM    Final    MR BRAIN WO CONTRAST  Result Date: 06/21/2022 CLINICAL DATA:  Syncope EXAM: MRI HEAD WITHOUT CONTRAST TECHNIQUE: Multiplanar, multiecho pulse sequences of the brain and surrounding structures were obtained without intravenous contrast. COMPARISON:  CT head 1 day prior FINDINGS: Brain: There is no acute intracranial hemorrhage, extra-axial fluid collection, or acute infarct There is mild parenchymal volume loss with prominence of the ventricular system and extra-axial CSF spaces. Patchy FLAIR signal abnormality in the supratentorial white matter likely reflects sequela of underlying chronic small-vessel ischemic change. 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 No cortical encephalomalacia is seen. There is a 1.1 cm extra-axial lesion overlying the lateral aspect of the left cerebellar hemisphere without mass effect on the underlying parenchyma or parenchymal edema, likely a meningioma. There is no evidence of invasion of the nearby transverse/sigmoid sinus. No other solid mass lesion is seen. The pituitary and suprasellar region are grossly unremarkable.  There is no midline shift. Vascular: Choose Skull and upper cervical spine: Normal marrow signal. Sinuses/Orbits: The paranasal sinuses are clear. A right lens implant is noted. The globes and orbits are otherwise unremarkable. IMPRESSION: 1. No acute intracranial pathology. 2. 1.1 cm extra-axial lesion overlying the left cerebellar hemisphere likely reflects a meningioma of doubtful clinical significance. Electronically Signed   By: Valetta Mole M.D.   On: 06/21/2022 11:41   CT Head Wo Contrast  Result Date: 06/20/2022 CLINICAL DATA:  Mental status change, unknown cause EXAM: CT HEAD WITHOUT CONTRAST TECHNIQUE: Contiguous axial images were obtained from the base of the skull through the vertex without intravenous contrast. RADIATION DOSE REDUCTION: This exam was performed according to the departmental dose-optimization program which includes automated exposure control, adjustment of the mA and/or kV according to patient size and/or use of iterative reconstruction technique. COMPARISON:  None Available. FINDINGS: Brain: There is periventricular white matter decreased attenuation consistent with small vessel ischemic changes. Ventricles, sulci and cisterns are prominent  consistent with age related involutional changes. No acute intracranial hemorrhage, mass effect or shift. No hydrocephalus. There is a small extra-axial lesion left posterior fossa laterally that appears to be dural-based measuring 1.0 cm. This likely represents a meningioma which can be confirmed with a contrast-enhanced MRI. No associated mass effect. Vascular: No hyperdense vessel or unexpected calcification. Skull: Normal. Negative for fracture or focal lesion. Sinuses/Orbits: No acute finding. IMPRESSION: Atrophy and chronic small vessel ischemic changes. Left posterior fossa extra-axial 1 cm lesion, possibly a meningioma which can be confirmed with contrast-enhanced MRI. Otherwise no acute intracranial process identified. Electronically  Signed   By: Sammie Bench M.D.   On: 06/20/2022 16:44   CT Angio Chest PE W and/or Wo Contrast  Result Date: 06/20/2022 CLINICAL DATA:  Rule out pulmonary embolus.  Elevated troponin EXAM: CT ANGIOGRAPHY CHEST WITH CONTRAST TECHNIQUE: Multidetector CT imaging of the chest was performed using the standard protocol during bolus administration of intravenous contrast. Multiplanar CT image reconstructions and MIPs were obtained to evaluate the vascular anatomy. RADIATION DOSE REDUCTION: This exam was performed according to the departmental dose-optimization program which includes automated exposure control, adjustment of the mA and/or kV according to patient size and/or use of iterative reconstruction technique. CONTRAST:  73m OMNIPAQUE IOHEXOL 350 MG/ML SOLN COMPARISON:  None FINDINGS: Exam detail is diminished secondary to respiratory motion artifact. Cardiovascular: Satisfactory opacification of the pulmonary arteries to the segmental level. No evidence of pulmonary embolism. Normal heart size. No pericardial effusion. Aortic atherosclerosis. Coronary artery calcifications. Mediastinum/Nodes: No enlarged mediastinal, hilar, or axillary lymph nodes. Signs of prior left axillary node dissection. Thyroid gland and trachea appear normal. Esophagus appears patulous which may reflect chronic dysmotility. Lungs/Pleura: No pleural effusion. No airspace consolidation identified no signs of interstitial edema. In the right apex there is a 1.2 x 1.2 cm part solid cystic nodule. The largest solid component measures up to 6 mm, image 56/5. Upper Abdomen: No acute abnormality. Right adrenal nodule measures 30 Hounsfield units and 1.3 cm, image 256/4. Left adrenal nodule measures 44 Hounsfield units and 1.4 cm, image 244/4. Low-attenuation large mint of the medial arm of the left adrenal gland is noted. Musculoskeletal: No chest wall abnormality. No acute or significant osseous findings. Review of the MIP images confirms  the above findings. IMPRESSION: 1. No evidence for acute pulmonary embolus. 2. Part solid cystic nodule within the right apex measures up to 1.2 cm with the largest solid component measuring up to 6 mm. Follow-up non-contrast CT recommended at 3-6 months to confirm persistence. If unchanged, and solid component remains <6 mm, annual CT is recommended until 5 years of stability has been established. If persistent these nodules should be considered highly suspicious if the solid component of the nodule is 6 mm or greater in size and enlarging. This recommendation follows the consensus statement: Guidelines for Management of Incidental Pulmonary Nodules Detected on CT Images:From the Fleischner Society 2017; published online before print (10.1148/radiol.[id number]. 3. 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 Recommend follow-up adrenal washout CT in 1 year. If stable for = 1 year, no further follow-up imaging. JACR 2017 Aug; 14(8):1038-44, JCAT 2016 Mar-Apr; 40(2):194-200, Urol J 2006 Spring; 3(2):71-4. 4. Coronary artery calcifications. 5. Patulous esophagus which may reflect chronic dysmotility. 6.  Aortic Atherosclerosis (ICD10-I70.0). Electronically Signed   By: TKerby MoorsM.D.   On: 06/20/2022 16:40   DG Chest Port 1 View  Result Date: 06/20/2022 CLINICAL DATA:  Acute mental status change EXAM:  PORTABLE CHEST 1 VIEW COMPARISON:  None Available. FINDINGS: The heart size and mediastinal contours are within normal limits. Both lungs are clear. The visualized skeletal structures are unremarkable. IMPRESSION: No active disease. Electronically Signed   By: Dorise Bullion III M.D.   On: 06/20/2022 14:11     Subjective: Patient was seen and examined at bedside.  Overnight events noted.   Patient reports doing much better.  Patient being discharged to assisted living facility.  Discharge Exam: Vitals:   06/23/22 0900 06/23/22 1000  BP:  126/70  Pulse:  75  Resp: 16 (!) 23   Temp:    SpO2:     Vitals:   06/22/22 2127 06/23/22 0517 06/23/22 0900 06/23/22 1000  BP: (!) 147/67 (!) 151/86  126/70  Pulse: 81 69  75  Resp:   16 (!) 23  Temp: 98.2 F (36.8 C) 98.1 F (36.7 C)    TempSrc: Oral Oral    SpO2: 91% 100%    Weight:  79.5 kg    Height:        General: Pt is alert, awake, not in acute distress Cardiovascular: RRR, S1/S2 +, no rubs, no gallops Respiratory: CTA bilaterally, no wheezing, no rhonchi Abdominal: Soft, NT, ND, bowel sounds + Extremities: no edema, no cyanosis    The results of significant diagnostics from this hospitalization (including imaging, microbiology, ancillary and laboratory) are listed below for reference.     Microbiology: Recent Results (from the past 240 hour(s))  Resp panel by RT-PCR (RSV, Flu A&B, Covid) Anterior Nasal Swab     Status: None   Collection Time: 06/20/22  2:35 PM   Specimen: Anterior Nasal Swab  Result Value Ref Range Status   SARS Coronavirus 2 by RT PCR NEGATIVE NEGATIVE Final    Comment: (NOTE) SARS-CoV-2 target nucleic acids are NOT DETECTED.  The SARS-CoV-2 RNA is generally detectable in upper respiratory specimens during the acute phase of infection. The lowest concentration of SARS-CoV-2 viral copies this assay can detect is 138 copies/mL. A negative result does not preclude SARS-Cov-2 infection and should not be used as the sole basis for treatment or other patient management decisions. A negative result may occur with  improper specimen collection/handling, submission of specimen other than nasopharyngeal swab, presence of viral mutation(s) within the areas targeted by this assay, and inadequate number of viral copies(<138 copies/mL). A negative result must be combined with clinical observations, patient history, and epidemiological information. The expected result is Negative.  Fact Sheet for Patients:  EntrepreneurPulse.com.au  Fact Sheet for Healthcare Providers:   IncredibleEmployment.be  This test is no t yet approved or cleared by the [place] FDA and  has been authorized for detection and/or diagnosis of SARS-CoV-2 by FDA under an Emergency Use Authorization (EUA). This EUA will remain  in effect (meaning this test can be used) for the duration of the COVID-19 declaration under Section 564(b)(1) of the Act, 21 U.S.C.section 360bbb-3(b)(1), unless the authorization is terminated  or revoked sooner.       Influenza A by PCR NEGATIVE NEGATIVE Final   Influenza B by PCR NEGATIVE NEGATIVE Final    Comment: (NOTE) The Xpert Xpress SARS-CoV-2/FLU/RSV plus assay is intended as an aid in the diagnosis of influenza from Nasopharyngeal swab specimens and should not be used as a sole basis for treatment. Nasal washings and aspirates are unacceptable for Xpert Xpress SARS-CoV-2/FLU/RSV testing.  Fact Sheet for Patients: EntrepreneurPulse.com.au  Fact Sheet for Healthcare Providers: IncredibleEmployment.be  This test is not  yet approved or cleared by the [place] and has been authorized for detection and/or diagnosis of SARS-CoV-2 by FDA under an Emergency Use Authorization (EUA). This EUA will remain in effect (meaning this test can be used) for the duration of the COVID-19 declaration under Section 564(b)(1) of the Act, 21 U.S.C. section 360bbb-3(b)(1), unless the authorization is terminated or revoked.     Resp Syncytial Virus by PCR NEGATIVE NEGATIVE Final    Comment: (NOTE) Fact Sheet for Patients: EntrepreneurPulse.com.au  Fact Sheet for Healthcare Providers: IncredibleEmployment.be  This test is not yet approved or cleared by the [place] FDA and has been authorized for detection and/or diagnosis of SARS-CoV-2 by FDA under an Emergency Use Authorization (EUA). This EUA will remain in effect (meaning this test can be used) for  the duration of the COVID-19 declaration under Section 564(b)(1) of the Act, 21 U.S.C. section 360bbb-3(b)(1), unless the authorization is terminated or revoked.  Performed at [facility], [place] [address] [place], [place] [address]   Culture, blood (routine x 2)     Status: None (Preliminary result)   Collection Time: 06/20/22  3:00 PM   Specimen: BLOOD  Result Value Ref Range Status   Specimen Description   Final    BLOOD LEFT ANTECUBITAL Performed at [facility] [address] [place], [place] [address]    Special Requests   Final    BOTTLES DRAWN AEROBIC AND ANAEROBIC Blood Culture adequate volume Performed at [facility] [address] [place], [place] [address]    Culture   Final    NO GROWTH 3 DAYS Performed at [facility], [place] [address] [place], [place] [address]    Report Status PENDING  Incomplete  Culture, blood (routine x 2)     Status: None (Preliminary result)   Collection Time: 06/20/22  3:15 PM   Specimen: BLOOD  Result Value Ref Range Status   Specimen Description   Final    BLOOD RIGHT ANTECUBITAL Performed at [facility] [address] [place], [place] [address]    Special Requests   Final    BOTTLES DRAWN AEROBIC AND ANAEROBIC Blood Culture adequate volume Performed at [facility] [address], [place], [place] [address]    Culture   Final    NO GROWTH 3 DAYS Performed at [facility], [place] [address], [place], [place] [address]    Report Status PENDING  Incomplete  Urine Culture (for pregnant, neutropenic or urologic patients or patients with an indwelling urinary catheter)     Status: Abnormal   Collection Time: 06/20/22 11:40 PM   Specimen: Urine, Clean Catch  Result Value Ref Range Status   Specimen Description   Final    URINE, CLEAN CATCH Performed at [facility], [place] [address], [place],   [address]    Special Requests   Final    NONE Performed at [facility], [place] [address], [place], [place] [address]    Culture 20,000 COLONIES/mL ENTEROCOCCUS FAECALIS (A)  Final   Report Status 06/23/2022 FINAL  Final   Organism ID, Bacteria ENTEROCOCCUS FAECALIS (A)  Final      Susceptibility   Enterococcus faecalis - MIC*    AMPICILLIN <=2 SENSITIVE Sensitive     NITROFURANTOIN <=16 SENSITIVE Sensitive     VANCOMYCIN 1 SENSITIVE Sensitive     * 20,000 COLONIES/mL ENTEROCOCCUS FAECALIS     Labs: BNP (  last 3 results) Recent Labs    06/20/22 1355  BNP [id number]*   Basic Metabolic Panel: Recent Labs  Lab 06/20/22 1343 06/20/22 1355 06/21/22 0015 06/21/22 0347 06/23/22 0414  NA 132*  --   --  138 135  K 4.4  --   --  3.5 3.3*  CL 92*  --   --  106 99  CO2 27  --   --  25 24  GLUCOSE 726*  --   --  272* 249*  BUN 32*  --   --  20 19  CREATININE 0.92  --  0.82 0.69 0.71  CALCIUM 9.3  --   --  8.2* 8.6*  MG  --  2.1 1.7  --  1.5*  PHOS  --  3.8 2.4*  --  3.3   Liver Function Tests: Recent Labs  Lab 06/20/22 1355 06/21/22 0347  AST 20 19  ALT 18 13  ALKPHOS 74 45  BILITOT 0.8 0.7  PROT 7.0 4.9*  ALBUMIN 3.7 2.5*   Recent Labs  Lab 06/20/22 1355  LIPASE 57*   Recent Labs  Lab 06/20/22 1355  AMMONIA 14   CBC: Recent Labs  Lab 06/20/22 1343 06/20/22 1530 06/21/22 0015 06/21/22 0347 06/23/22 0414  WBC 5.4 6.8 6.4 7.1 5.9  NEUTROABS  --  5.0  --   --   --   HGB 14.2 14.8 12.4 11.6* 13.2  HCT 42.9 43.6 37.5 35.5* 40.1  MCV 88.6 87.6 89.7 90.6 88.3  PLT 199 197 172 172 163   Cardiac Enzymes: No results for input(s): "CKTOTAL", "CKMB", "CKMBINDEX", "TROPONINI" in the last 168 hours. BNP: Invalid input(s): "POCBNP" CBG: Recent Labs  Lab 06/22/22 1210 06/22/22 1700 06/22/22 2121 06/23/22 0754 06/23/22 1205  GLUCAP 219* 230* 219* 189* 178*   D-Dimer No results for input(s): "DDIMER" in the last 72 hours. Hgb A1c Recent Labs     06/21/22 0018  HGBA1C 14.3*   Lipid Profile No results for input(s): "CHOL", "HDL", "LDLCALC", "TRIG", "CHOLHDL", "LDLDIRECT" in the last 72 hours. Thyroid function studies Recent Labs    06/21/22 0015  TSH 2.674   Anemia work up No results for input(s): "VITAMINB12", "FOLATE", "FERRITIN", "TIBC", "IRON", "RETICCTPCT" in the last 72 hours. Urinalysis    Component Value Date/Time   COLORURINE STRAW (A) 06/20/2022 1355   APPEARANCEUR CLEAR 06/20/2022 1355   LABSPEC 1.020 06/20/2022 1355   PHURINE 8.0 06/20/2022 1355   GLUCOSEU >=500 (A) 06/20/2022 1355   HGBUR NEGATIVE 06/20/2022 1355   BILIRUBINUR NEGATIVE 06/20/2022 1355   KETONESUR 20 (A) 06/20/2022 1355   PROTEINUR NEGATIVE 06/20/2022 1355   UROBILINOGEN 0.2 12/04/2009 1243   NITRITE NEGATIVE 06/20/2022 1355   LEUKOCYTESUR SMALL (A) 06/20/2022 1355   Sepsis Labs Recent Labs  Lab 06/20/22 1530 06/21/22 0015 06/21/22 0347 06/23/22 0414  WBC 6.8 6.4 7.1 5.9   Microbiology Recent Results (from the past 240 hour(s))  Resp panel by RT-PCR (RSV, Flu A&B, Covid) Anterior Nasal Swab     Status: None   Collection Time: 06/20/22  2:35 PM   Specimen: Anterior Nasal Swab  Result Value Ref Range Status   SARS Coronavirus 2 by RT PCR NEGATIVE NEGATIVE Final    Comment: (NOTE) SARS-CoV-2 target nucleic acids are NOT DETECTED.  The SARS-CoV-2 RNA is generally detectable in upper respiratory specimens during the acute phase of infection. The lowest concentration of SARS-CoV-2 viral copies this assay can detect is 138 copies/mL. A negative result does not preclude  SARS-Cov-2 infection and should not be used as the sole basis for treatment or other patient management decisions. A negative result may occur with  improper specimen collection/handling, submission of specimen other than nasopharyngeal swab, presence of viral mutation(s) within the areas targeted by this assay, and inadequate number of viral copies(<138  copies/mL). A negative result must be combined with clinical observations, patient history, and epidemiological information. The expected result is Negative.  Fact Sheet for Patients:  EntrepreneurPulse.com.au  Fact Sheet for Healthcare Providers:  IncredibleEmployment.be  This test is no t yet approved or cleared by the [place] FDA and  has been authorized for detection and/or diagnosis of SARS-CoV-2 by FDA under an Emergency Use Authorization (EUA). This EUA will remain  in effect (meaning this test can be used) for the duration of the COVID-19 declaration under Section 564(b)(1) of the Act, 21 U.S.C.section 360bbb-3(b)(1), unless the authorization is terminated  or revoked sooner.       Influenza A by PCR NEGATIVE NEGATIVE Final   Influenza B by PCR NEGATIVE NEGATIVE Final    Comment: (NOTE) The Xpert Xpress SARS-CoV-2/FLU/RSV plus assay is intended as an aid in the diagnosis of influenza from Nasopharyngeal swab specimens and should not be used as a sole basis for treatment. Nasal washings and aspirates are unacceptable for Xpert Xpress SARS-CoV-2/FLU/RSV testing.  Fact Sheet for Patients: EntrepreneurPulse.com.au  Fact Sheet for Healthcare Providers: IncredibleEmployment.be  This test is not yet approved or cleared by the [place] FDA and has been authorized for detection and/or diagnosis of SARS-CoV-2 by FDA under an Emergency Use Authorization (EUA). This EUA will remain in effect (meaning this test can be used) for the duration of the COVID-19 declaration under Section 564(b)(1) of the Act, 21 U.S.C. section 360bbb-3(b)(1), unless the authorization is terminated or revoked.     Resp Syncytial Virus by PCR NEGATIVE NEGATIVE Final    Comment: (NOTE) Fact Sheet for Patients: EntrepreneurPulse.com.au  Fact Sheet for Healthcare  Providers: IncredibleEmployment.be  This test is not yet approved or cleared by the [place] FDA and has been authorized for detection and/or diagnosis of SARS-CoV-2 by FDA under an Emergency Use Authorization (EUA). This EUA will remain in effect (meaning this test can be used) for the duration of the COVID-19 declaration under Section 564(b)(1) of the Act, 21 U.S.C. section 360bbb-3(b)(1), unless the authorization is terminated or revoked.  Performed at [facility], [place] [address], [place], [place] [address]   Culture, blood (routine x 2)     Status: None (Preliminary result)   Collection Time: 06/20/22  3:00 PM   Specimen: BLOOD  Result Value Ref Range Status   Specimen Description   Final    BLOOD LEFT ANTECUBITAL Performed at [place] [address], [place], [place] [address]    Special Requests   Final    BOTTLES DRAWN AEROBIC AND ANAEROBIC Blood Culture adequate volume Performed at [facility] [address] [place], [place] [address]    Culture   Final    NO GROWTH 3 DAYS Performed at [facility], [place] [address] [place], [place] [address]    Report Status PENDING  Incomplete  Culture, blood (routine x 2)     Status: None (Preliminary result)   Collection Time: 06/20/22  3:15 PM   Specimen: BLOOD  Result Value Ref Range Status   Specimen Description   Final    BLOOD RIGHT ANTECUBITAL Performed at [facility],  [place] [address], [place], [place] [address]    Special Requests   Final    BOTTLES DRAWN AEROBIC AND ANAEROBIC Blood Culture adequate volume Performed at [place] [address] [place], [place] [address]    Culture   Final    NO GROWTH 3 DAYS Performed at [facility], [place] [address], [place], [place] [address]    Report Status PENDING  Incomplete  Urine Culture (for pregnant, neutropenic or urologic patients  or patients with an indwelling urinary catheter)     Status: Abnormal   Collection Time: 06/20/22 11:40 PM   Specimen: Urine, Clean Catch  Result Value Ref Range Status   Specimen Description   Final    URINE, CLEAN CATCH Performed at [facility], [place] [address] [place], [place] [address]    Special Requests   Final    NONE Performed at [facility], [place] [address] [place], [place] [address]    Culture 20,000 COLONIES/mL ENTEROCOCCUS FAECALIS (A)  Final   Report Status 06/23/2022 FINAL  Final   Organism ID, Bacteria ENTEROCOCCUS FAECALIS (A)  Final      Susceptibility   Enterococcus faecalis - MIC*    AMPICILLIN <=2 SENSITIVE Sensitive     NITROFURANTOIN <=16 SENSITIVE Sensitive     VANCOMYCIN 1 SENSITIVE Sensitive     * 20,000 COLONIES/mL ENTEROCOCCUS FAECALIS     Time coordinating discharge: Over 30 minutes  SIGNED:   Duard Brady, MD  [facility] 06/23/2022, 1:17 PM Pager   If 7PM-7AM, please contact night-coverage

## 2022-06-23 NOTE — Discharge Instructions (Signed)
Advised to follow-up with primary care physician in 1 week. Advised to take Keflex 500 mg twice daily for 4 more days for UTI. Syncope workup has been unremarkable.

## 2022-06-23 NOTE — Progress Notes (Signed)
Patient's discharge instructions were given to patient's legal guardian, which were repeated several times.   When patient was taken down to get picked up by legal guardian to be taken back to the [facility]), patient had to be brought back up to the floor because legal guardian never showed back.   After waiting patiently for at least 20 minutes, patient finally was picked up by legal guardian to be taken back to [facility]).

## 2022-06-23 NOTE — Plan of Care (Signed)
  Problem: Education: Goal: Knowledge of condition and prescribed therapy will improve Outcome: Completed/Met   Problem: Cardiac: Goal: Will achieve and/or maintain adequate cardiac output Outcome: Completed/Met   Problem: Physical Regulation: Goal: Complications related to the disease process, condition or treatment will be avoided or minimized Outcome: Completed/Met   Problem: Education: Goal: Knowledge of General Education information will improve Description: Including pain rating scale, medication(s)/side effects and non-pharmacologic comfort measures Outcome: Completed/Met   Problem: Health Behavior/Discharge Planning: Goal: Ability to manage health-related needs will improve Outcome: Completed/Met   Problem: Clinical Measurements: Goal: Ability to maintain clinical measurements within normal limits will improve Outcome: Completed/Met Goal: Will remain free from infection Outcome: Completed/Met Goal: Diagnostic test results will improve Outcome: Completed/Met Goal: Respiratory complications will improve Outcome: Completed/Met Goal: Cardiovascular complication will be avoided Outcome: Completed/Met   Problem: Activity: Goal: Risk for activity intolerance will decrease Outcome: Completed/Met   Problem: Nutrition: Goal: Adequate nutrition will be maintained Outcome: Completed/Met   Problem: Coping: Goal: Level of anxiety will decrease Outcome: Completed/Met   Problem: Elimination: Goal: Will not experience complications related to bowel motility Outcome: Completed/Met Goal: Will not experience complications related to urinary retention Outcome: Completed/Met   Problem: Pain Managment: Goal: General experience of comfort will improve Outcome: Completed/Met   Problem: Safety: Goal: Ability to remain free from injury will improve Outcome: Completed/Met   Problem: Skin Integrity: Goal: Risk for impaired skin integrity will decrease Outcome: Completed/Met    Problem: Education: Goal: Ability to describe self-care measures that may prevent or decrease complications (Diabetes Survival Skills Education) will improve Outcome: Completed/Met Goal: Individualized Educational Video(s) Outcome: Completed/Met   Problem: Coping: Goal: Ability to adjust to condition or change in health will improve Outcome: Completed/Met   Problem: Fluid Volume: Goal: Ability to maintain a balanced intake and output will improve Outcome: Completed/Met   Problem: Health Behavior/Discharge Planning: Goal: Ability to identify and utilize available resources and services will improve Outcome: Completed/Met Goal: Ability to manage health-related needs will improve Outcome: Completed/Met   Problem: Metabolic: Goal: Ability to maintain appropriate glucose levels will improve Outcome: Completed/Met   Problem: Nutritional: Goal: Maintenance of adequate nutrition will improve Outcome: Completed/Met Goal: Progress toward achieving an optimal weight will improve Outcome: Completed/Met   Problem: Skin Integrity: Goal: Risk for impaired skin integrity will decrease Outcome: Completed/Met   Problem: Tissue Perfusion: Goal: Adequacy of tissue perfusion will improve Outcome: Completed/Met   Problem: Education: Goal: Ability to describe self-care measures that may prevent or decrease complications (Diabetes Survival Skills Education) will improve Outcome: Completed/Met Goal: Individualized Educational Video(s) Outcome: Completed/Met   Problem: Coping: Goal: Ability to adjust to condition or change in health will improve Outcome: Completed/Met   Problem: Fluid Volume: Goal: Ability to maintain a balanced intake and output will improve Outcome: Completed/Met   Problem: Health Behavior/Discharge Planning: Goal: Ability to identify and utilize available resources and services will improve Outcome: Completed/Met Goal: Ability to manage health-related needs will  improve Outcome: Completed/Met   Problem: Metabolic: Goal: Ability to maintain appropriate glucose levels will improve Outcome: Completed/Met   Problem: Nutritional: Goal: Maintenance of adequate nutrition will improve Outcome: Completed/Met Goal: Progress toward achieving an optimal weight will improve Outcome: Completed/Met   Problem: Skin Integrity: Goal: Risk for impaired skin integrity will decrease Outcome: Completed/Met

## 2022-06-23 NOTE — Inpatient Diabetes Management (Signed)
Inpatient Diabetes Program Recommendations  AACE/ADA: New Consensus Statement on Inpatient Glycemic Control (2015)  Target Ranges:  Prepandial:   less than 140 mg/dL      Peak postprandial:   less than 180 mg/dL (1-2 hours)      Critically ill patients:  140 - 180 mg/dL   Lab Results  Component Value Date   GLUCAP 189 (H) 06/23/2022   HGBA1C 14.3 (H) 06/21/2022    Review of Glycemic Control  Latest Reference Range & Units 06/22/22 12:10 06/22/22 17:00 06/22/22 21:21 06/23/22 07:54  Glucose-Capillary 70 - 99 mg/dL 219 (H) 230 (H) 219 (H) 189 (H)  (H): Data is abnormally high Diabetes history: DM 2 Outpatient Diabetes medications: Trulicity 0.5 mg Weekly Current orders for Inpatient glycemic control:  Novolog 0-15 units TID: Semglee 15 units qhs   Inpatient Diabetes Program Recommendations:    Consider increasing Semglee to 20 units QHS and would anticipate this dose at discharge.   Spoke with Arbie Cookey, patient's sister and legal guardian regarding outpatient diabetes management.  Reviewed patient's current A1c of 14.3%. Explained what a A1c is and what it measures. Also reviewed goal A1c with patient, importance of good glucose control @ home, and blood sugar goals. Reviewed patho of DM, need for improvement, current inpatient trends and dosages, survival skills, interventions, vascular changes and commorbidities associated.  Patient to go to ALF where medications can be administered and blood sugars can be monitored. Provided goals and encouraged to follow up with PCP; planning to make appointment.  Arbie Cookey reports that there may be difficulty with controlling sugary beverages and items that high in sugar at the facility. Provided some resources to overcome this challenge while still promoting independence.  Discussed the need for insulin and questions to ask to further advocate for patient. Sister expressed understanding and has no further questions at this time.  Secure chat sent to MD  with recs.   Thanks, Bronson Curb, MSN, RNC-OB Diabetes Coordinator [phone] (8a-5p)

## 2022-06-23 NOTE — Progress Notes (Signed)
Patient is being discharged with present general guardian. Medications and discharge instructions explained and received

## 2022-06-24 DIAGNOSIS — R2689 Other abnormalities of gait and mobility: Secondary | ICD-10-CM | POA: Diagnosis not present

## 2022-06-24 DIAGNOSIS — M6281 Muscle weakness (generalized): Secondary | ICD-10-CM | POA: Diagnosis not present

## 2022-06-24 DIAGNOSIS — N3946 Mixed incontinence: Secondary | ICD-10-CM | POA: Diagnosis not present

## 2022-06-24 DIAGNOSIS — R279 Unspecified lack of coordination: Secondary | ICD-10-CM | POA: Diagnosis not present

## 2022-06-25 DIAGNOSIS — R2689 Other abnormalities of gait and mobility: Secondary | ICD-10-CM | POA: Diagnosis not present

## 2022-06-25 DIAGNOSIS — R279 Unspecified lack of coordination: Secondary | ICD-10-CM | POA: Diagnosis not present

## 2022-06-25 DIAGNOSIS — N3946 Mixed incontinence: Secondary | ICD-10-CM | POA: Diagnosis not present

## 2022-06-25 DIAGNOSIS — M6281 Muscle weakness (generalized): Secondary | ICD-10-CM | POA: Diagnosis not present

## 2022-06-25 LAB — CULTURE, BLOOD (ROUTINE X 2)
Culture: NO GROWTH
Culture: NO GROWTH
Special Requests: ADEQUATE
Special Requests: ADEQUATE

## 2022-06-26 DIAGNOSIS — M79672 Pain in left foot: Secondary | ICD-10-CM | POA: Diagnosis not present

## 2022-06-26 DIAGNOSIS — B351 Tinea unguium: Secondary | ICD-10-CM | POA: Diagnosis not present

## 2022-06-26 DIAGNOSIS — M79671 Pain in right foot: Secondary | ICD-10-CM | POA: Diagnosis not present

## 2022-06-26 DIAGNOSIS — L600 Ingrowing nail: Secondary | ICD-10-CM | POA: Diagnosis not present

## 2022-06-26 DIAGNOSIS — E1140 Type 2 diabetes mellitus with diabetic neuropathy, unspecified: Secondary | ICD-10-CM | POA: Diagnosis not present

## 2022-06-29 DIAGNOSIS — N3946 Mixed incontinence: Secondary | ICD-10-CM | POA: Diagnosis not present

## 2022-06-29 DIAGNOSIS — M6281 Muscle weakness (generalized): Secondary | ICD-10-CM | POA: Diagnosis not present

## 2022-06-29 DIAGNOSIS — R2689 Other abnormalities of gait and mobility: Secondary | ICD-10-CM | POA: Diagnosis not present

## 2022-06-29 DIAGNOSIS — R279 Unspecified lack of coordination: Secondary | ICD-10-CM | POA: Diagnosis not present

## 2022-06-30 DIAGNOSIS — M6281 Muscle weakness (generalized): Secondary | ICD-10-CM | POA: Diagnosis not present

## 2022-06-30 DIAGNOSIS — I10 Essential (primary) hypertension: Secondary | ICD-10-CM | POA: Diagnosis not present

## 2022-06-30 DIAGNOSIS — D320 Benign neoplasm of cerebral meninges: Secondary | ICD-10-CM | POA: Diagnosis not present

## 2022-06-30 DIAGNOSIS — R2689 Other abnormalities of gait and mobility: Secondary | ICD-10-CM | POA: Diagnosis not present

## 2022-06-30 DIAGNOSIS — I700 Atherosclerosis of aorta: Secondary | ICD-10-CM | POA: Diagnosis not present

## 2022-06-30 DIAGNOSIS — E1169 Type 2 diabetes mellitus with other specified complication: Secondary | ICD-10-CM | POA: Diagnosis not present

## 2022-06-30 DIAGNOSIS — R279 Unspecified lack of coordination: Secondary | ICD-10-CM | POA: Diagnosis not present

## 2022-06-30 DIAGNOSIS — R8290 Unspecified abnormal findings in urine: Secondary | ICD-10-CM | POA: Diagnosis not present

## 2022-06-30 DIAGNOSIS — Z87440 Personal history of urinary (tract) infections: Secondary | ICD-10-CM | POA: Diagnosis not present

## 2022-06-30 DIAGNOSIS — N390 Urinary tract infection, site not specified: Secondary | ICD-10-CM | POA: Diagnosis not present

## 2022-06-30 DIAGNOSIS — E278 Other specified disorders of adrenal gland: Secondary | ICD-10-CM | POA: Diagnosis not present

## 2022-06-30 DIAGNOSIS — Z09 Encounter for follow-up examination after completed treatment for conditions other than malignant neoplasm: Secondary | ICD-10-CM | POA: Diagnosis not present

## 2022-06-30 DIAGNOSIS — R911 Solitary pulmonary nodule: Secondary | ICD-10-CM | POA: Diagnosis not present

## 2022-06-30 DIAGNOSIS — R739 Hyperglycemia, unspecified: Secondary | ICD-10-CM | POA: Diagnosis not present

## 2022-06-30 DIAGNOSIS — N3946 Mixed incontinence: Secondary | ICD-10-CM | POA: Diagnosis not present

## 2022-07-01 ENCOUNTER — Other Ambulatory Visit (HOSPITAL_COMMUNITY): Payer: Self-pay

## 2022-07-01 DIAGNOSIS — N3946 Mixed incontinence: Secondary | ICD-10-CM | POA: Diagnosis not present

## 2022-07-01 DIAGNOSIS — R279 Unspecified lack of coordination: Secondary | ICD-10-CM | POA: Diagnosis not present

## 2022-07-01 DIAGNOSIS — M6281 Muscle weakness (generalized): Secondary | ICD-10-CM | POA: Diagnosis not present

## 2022-07-01 DIAGNOSIS — R2689 Other abnormalities of gait and mobility: Secondary | ICD-10-CM | POA: Diagnosis not present

## 2022-07-02 DIAGNOSIS — N3946 Mixed incontinence: Secondary | ICD-10-CM | POA: Diagnosis not present

## 2022-07-02 DIAGNOSIS — R279 Unspecified lack of coordination: Secondary | ICD-10-CM | POA: Diagnosis not present

## 2022-07-02 DIAGNOSIS — M6281 Muscle weakness (generalized): Secondary | ICD-10-CM | POA: Diagnosis not present

## 2022-07-02 DIAGNOSIS — R2689 Other abnormalities of gait and mobility: Secondary | ICD-10-CM | POA: Diagnosis not present

## 2022-07-03 DIAGNOSIS — R279 Unspecified lack of coordination: Secondary | ICD-10-CM | POA: Diagnosis not present

## 2022-07-03 DIAGNOSIS — R2689 Other abnormalities of gait and mobility: Secondary | ICD-10-CM | POA: Diagnosis not present

## 2022-07-03 DIAGNOSIS — M6281 Muscle weakness (generalized): Secondary | ICD-10-CM | POA: Diagnosis not present

## 2022-07-03 DIAGNOSIS — N3946 Mixed incontinence: Secondary | ICD-10-CM | POA: Diagnosis not present

## 2022-07-05 ENCOUNTER — Encounter (HOSPITAL_COMMUNITY): Payer: Self-pay | Admitting: Emergency Medicine

## 2022-07-05 ENCOUNTER — Other Ambulatory Visit: Payer: Self-pay

## 2022-07-05 ENCOUNTER — Emergency Department (HOSPITAL_COMMUNITY)
Admission: EM | Admit: 2022-07-05 | Discharge: 2022-07-06 | Disposition: A | Discharge status: Other Acute Inpatient Hospital | Payer: Medicare Other | Attending: Emergency Medicine | Admitting: Emergency Medicine

## 2022-07-05 DIAGNOSIS — F0390 Unspecified dementia without behavioral disturbance: Secondary | ICD-10-CM | POA: Insufficient documentation

## 2022-07-05 DIAGNOSIS — E1165 Type 2 diabetes mellitus with hyperglycemia: Secondary | ICD-10-CM | POA: Insufficient documentation

## 2022-07-05 DIAGNOSIS — I10 Essential (primary) hypertension: Secondary | ICD-10-CM | POA: Insufficient documentation

## 2022-07-05 DIAGNOSIS — R410 Disorientation, unspecified: Secondary | ICD-10-CM | POA: Diagnosis not present

## 2022-07-05 DIAGNOSIS — R739 Hyperglycemia, unspecified: Secondary | ICD-10-CM

## 2022-07-05 DIAGNOSIS — Z79899 Other long term (current) drug therapy: Secondary | ICD-10-CM | POA: Diagnosis not present

## 2022-07-05 LAB — CBC WITH DIFFERENTIAL/PLATELET
Abs Immature Granulocytes: 0.03 10*3/uL (ref 0.00–0.07)
Basophils Absolute: 0 10*3/uL (ref 0.0–0.1)
Basophils Relative: 1 %
Eosinophils Absolute: 0 10*3/uL (ref 0.0–0.5)
Eosinophils Relative: 1 %
HCT: 42.5 % (ref 36.0–46.0)
Hemoglobin: 14.4 g/dL (ref 12.0–15.0)
Immature Granulocytes: 1 %
Lymphocytes Relative: 23 %
Lymphs Abs: 1.4 10*3/uL (ref 0.7–4.0)
MCH: 29.5 pg (ref 26.0–34.0)
MCHC: 33.9 g/dL (ref 30.0–36.0)
MCV: 87.1 fL (ref 80.0–100.0)
Monocytes Absolute: 0.4 10*3/uL (ref 0.1–1.0)
Monocytes Relative: 6 %
Neutro Abs: 4.2 10*3/uL (ref 1.7–7.7)
Neutrophils Relative %: 68 %
Platelets: 194 10*3/uL (ref 150–400)
RBC: 4.88 MIL/uL (ref 3.87–5.11)
RDW: 12.5 % (ref 11.5–15.5)
WBC: 6 10*3/uL (ref 4.0–10.5)
nRBC: 0 % (ref 0.0–0.2)

## 2022-07-05 LAB — CBG MONITORING, ED
Glucose-Capillary: 441 mg/dL — ABNORMAL HIGH (ref 70–99)
Glucose-Capillary: 489 mg/dL — ABNORMAL HIGH (ref 70–99)
Glucose-Capillary: 183 mg/dL — ABNORMAL HIGH (ref 70–99)
Glucose-Capillary: 243 mg/dL — ABNORMAL HIGH (ref 70–99)
Glucose-Capillary: 172 mg/dL — ABNORMAL HIGH (ref 70–99)

## 2022-07-05 LAB — BASIC METABOLIC PANEL
Anion gap: 15 (ref 5–15)
BUN: 21 mg/dL (ref 8–23)
CO2: 23 mmol/L (ref 22–32)
Calcium: 9.1 mg/dL (ref 8.9–10.3)
Chloride: 89 mmol/L — ABNORMAL LOW (ref 98–111)
Creatinine, Ser: 0.97 mg/dL (ref 0.44–1.00)
GFR, Estimated: >60 mL/min (ref >60)
Glucose, Bld: 694 mg/dL (ref 70–99)
Potassium: 4.1 mmol/L (ref 3.5–5.1)
Sodium: 127 mmol/L — ABNORMAL LOW (ref 135–145)

## 2022-07-05 MED ORDER — SODIUM CHLORIDE 0.9 % IV BOLUS
1000.0000 mL | Freq: Once | INTRAVENOUS | Status: AC
  Administered 2022-07-05: 1000 mL via INTRAVENOUS

## 2022-07-05 MED ORDER — INSULIN ASPART 100 UNIT/ML IV SOLN
5.0000 [IU] | Freq: Once | INTRAVENOUS | Status: AC
  Administered 2022-07-05: 5 [IU] via INTRAVENOUS
  Filled 2022-07-05: qty 0.05

## 2022-07-05 MED ORDER — INSULIN ASPART 100 UNIT/ML IJ SOLN
15.0000 [IU] | Freq: Once | INTRAMUSCULAR | Status: AC
  Administered 2022-07-05: 15 [IU] via SUBCUTANEOUS
  Filled 2022-07-05: qty 0.15

## 2022-07-05 NOTE — ED Triage Notes (Signed)
BIB EMS from [facility] for  hyperglycemia.  Glucometer reading high at facility and by EMS. Per EMS pt was recently changed to trulicity.

## 2022-07-05 NOTE — ED Provider Notes (Signed)
[facility]GENC[facility] DE[facility]MENT AT [facility] Provider Note   CSN: [id number] Arrival date & time: 07/05/22  1706     History  Chief Complaint  Patient presents with   Hyperglycemia    Alexandra Lamb is a 77 y.o. female.  Patient has a history of diabetes hypertension dementia.  She is sent to the emergency department for high glucose  The history is provided by the nursing home and medical records.  Hyperglycemia Blood sugar level PTA:  Over 600 Severity:  Moderate Onset quality:  Sudden Timing:  Constant Progression:  Worsening Chronicity:  New Diabetes status:  Controlled with oral medications Context: not change in medication   Relieved by:  Nothing Ineffective treatments:  None tried Associated symptoms: no abdominal pain, no chest pain and no fatigue   Risk factors: no obesity        Home Medications Prior to Admission medications   Medication Sig Start Date End Date Taking? Authorizing Provider  acetaminophen (TYLENOL) 500 MG tablet Take 500 mg by mouth every 6 (six) hours as needed.      [provider]  amLODipine (NORVASC) 2.5 MG tablet Take 2.5 mg by mouth daily.    [provider]  amoxicillin (AMOXIL) 500 MG capsule Take 1 capsule (500 mg total) by mouth 3 (three) times daily for 4 days 06/23/22   Willeen Niece, MD  atenolol (TENORMIN) 100 MG tablet Take 100 mg by mouth daily.    [provider]  Cholecalciferol (RA VITAMIN D-3) 2000 units CAPS Take 1 capsule (2,000 Units total) by mouth daily. 12/21/17   Serena Croissant, MD  divalproex (DEPAKOTE ER) 500 MG 24 hr tablet 1,500 mg at bedtime. 09/28/19   [provider]  Dulaglutide (TRULICITY) 4.5 MG/0.5ML SOPN Inject 0.5 mLs into the skin every 7 (seven) days.    [provider]  latanoprost (XALATAN) 0.005 % ophthalmic solution Place 1 drop into both eyes at bedtime.    [provider]  levothyroxine (SYNTHROID) 112 MCG tablet Take 112 mcg by  mouth daily before breakfast.    [provider]  memantine (NAMENDA) 10 MG tablet Take 10 mg by mouth 2 (two) times daily.    [provider]  OLANZapine (ZYPREXA) 15 MG tablet Take 15 mg by mouth at bedtime.    [provider]  simvastatin (ZOCOR) 20 MG tablet Take 20 mg by mouth daily.    [provider]  valsartan-hydrochlorothiazide (DIOVAN-HCT) 320-12.5 MG tablet Take 1 tablet by mouth daily.    [provider]      Allergies    Patient has no known allergies.    Review of Systems   Review of Systems  Constitutional:  Negative for appetite change and fatigue.  HENT:  Negative for congestion, ear discharge and sinus pressure.   Eyes:  Negative for discharge.  Respiratory:  Negative for cough.   Cardiovascular:  Negative for chest pain.  Gastrointestinal:  Negative for abdominal pain and diarrhea.  Genitourinary:  Negative for frequency and hematuria.  Musculoskeletal:  Negative for back pain.  Skin:  Negative for rash.  Neurological:  Negative for seizures and headaches.  Psychiatric/Behavioral:  Negative for hallucinations.     Physical Exam Updated Vital Signs BP 100/81   Pulse 79   Temp 98 F (36.7 C)   Resp 18   SpO2 97%  Physical Exam Vitals and nursing note reviewed.  Constitutional:      Appearance: She is well-developed.  HENT:  Head: Normocephalic.     Nose: Nose normal.  Eyes:     General: No scleral icterus.    Conjunctiva/sclera: Conjunctivae normal.  Neck:     Thyroid: No thyromegaly.  Cardiovascular:     Rate and Rhythm: Normal rate and regular rhythm.     Heart sounds: No murmur heard.    No friction rub. No gallop.  Pulmonary:     Breath sounds: No stridor. No wheezing or rales.  Chest:     Chest wall: No tenderness.  Abdominal:     General: There is no distension.     Tenderness: There is no abdominal tenderness. There is no rebound.  Musculoskeletal:        General: Normal range of motion.      Cervical back: Neck supple.  Lymphadenopathy:     Cervical: No cervical adenopathy.  Skin:    Findings: No erythema or rash.  Neurological:     Mental Status: She is alert.     Motor: No abnormal muscle tone.     Coordination: Coordination normal.     Comments: Oriented to person  Psychiatric:        Behavior: Behavior normal.     ED Results / Procedures / Treatments   Labs (all labs ordered are listed, but only abnormal results are displayed) Labs Reviewed  BASIC METABOLIC PANEL - Abnormal; Notable for the following components:      Result Value   Sodium 127 (*)    Chloride 89 (*)    Glucose, Bld 694 (*)    All other components within normal limits  CBG MONITORING, ED - Abnormal; Notable for the following components:   Glucose-Capillary 489 (*)    All other components within normal limits  CBG MONITORING, ED - Abnormal; Notable for the following components:   Glucose-Capillary 441 (*)    All other components within normal limits  CBG MONITORING, ED - Abnormal; Notable for the following components:   Glucose-Capillary 243 (*)    All other components within normal limits  CBG MONITORING, ED - Abnormal; Notable for the following components:   Glucose-Capillary 183 (*)    All other components within normal limits  CBG MONITORING, ED - Abnormal; Notable for the following components:   Glucose-Capillary 172 (*)    All other components within normal limits  CBC WITH DIFFERENTIAL/PLATELET    EKG None  Radiology No results found.  Procedures Procedures    Medications Ordered in ED Medications  sodium chloride 0.9 % bolus 1,000 mL (0 mLs Intravenous Stopped 07/05/22 1834)  insulin aspart (novoLOG) injection 15 Units (15 Units Subcutaneous Given 07/05/22 1848)  sodium chloride 0.9 % bolus 1,000 mL (0 mLs Intravenous Stopped 07/05/22 2148)  insulin aspart (novoLOG) injection 5 Units (5 Units Intravenous Given 07/05/22 2024)    ED Course/ Medical Decision Making/  A&P                             Medical Decision Making Amount and/or Complexity of Data Reviewed Labs: ordered.  Risk OTC drugs. Prescription drug management.    This patient presents to the ED for concern of elevated glucose, this involves an extensive number of treatment options, and is a complaint that carries with it a high risk of complications and morbidity.  The differential diagnosis includes poorly controlled diabetes, infection   Co morbidities that complicate the patient evaluation  Diabetes and dementia   Additional history obtained:  Additional history obtained from nursing home, patient External records from outside source obtained and reviewed including hospital records   Lab Tests:  I Ordered, and personally interpreted labs.  The pertinent results include: Initial glucose 694   Imaging Studies ordered:  No imaging Cardiac Monitoring: / EKG:  The patient was maintained on a cardiac monitor.  I personally viewed and interpreted the cardiac monitored which showed an underlying rhythm of: Normal sinus rhythm   Consultations Obtained:  No consultant  Problem List / ED Course / Critical interventions / Medication management  Diabetes and dementia I ordered medication including insulin and saline Reevaluation of the patient after these medicines showed that the patient improved I have reviewed the patients home medicines and have made adjustments as needed   Social Determinants of Health:  None   Test / Admission - Considered:  None   Patient with poorly controlled diabetes.  She was given insulin and saline and her glucose became under control.  She will follow-up with her doctor for change in diabetic medicine        Final Clinical Impression(s) / ED Diagnoses Final diagnoses:  Hyperglycemia    Rx / DC Orders ED Discharge Orders     None         Bethann Berkshire, MD 07/07/22 [phone]

## 2022-07-05 NOTE — ED Notes (Signed)
[facility] contacted for transport 

## 2022-07-05 NOTE — Discharge Instructions (Signed)
Follow-up with your doctor this week to adjust your diabetic medicine

## 2022-07-05 NOTE — ED Notes (Signed)
Pt brief changed. Pt had small soft BM in brief. Pt cleaned and purwick placed.

## 2022-07-06 DIAGNOSIS — R279 Unspecified lack of coordination: Secondary | ICD-10-CM | POA: Diagnosis not present

## 2022-07-06 DIAGNOSIS — Z7401 Bed confinement status: Secondary | ICD-10-CM | POA: Diagnosis not present

## 2022-07-06 DIAGNOSIS — R2689 Other abnormalities of gait and mobility: Secondary | ICD-10-CM | POA: Diagnosis not present

## 2022-07-06 DIAGNOSIS — R739 Hyperglycemia, unspecified: Secondary | ICD-10-CM | POA: Diagnosis not present

## 2022-07-06 DIAGNOSIS — N3946 Mixed incontinence: Secondary | ICD-10-CM | POA: Diagnosis not present

## 2022-07-06 DIAGNOSIS — M6281 Muscle weakness (generalized): Secondary | ICD-10-CM | POA: Diagnosis not present

## 2022-07-06 DIAGNOSIS — R4182 Altered mental status, unspecified: Secondary | ICD-10-CM | POA: Diagnosis not present

## 2022-07-06 NOTE — ED Notes (Signed)
Pt pericare performed.  Removed soiled brief with medium brown BM, cleaned patient and placed clean brief before departure with [facility]

## 2022-07-07 DIAGNOSIS — R279 Unspecified lack of coordination: Secondary | ICD-10-CM | POA: Diagnosis not present

## 2022-07-07 DIAGNOSIS — N3946 Mixed incontinence: Secondary | ICD-10-CM | POA: Diagnosis not present

## 2022-07-07 DIAGNOSIS — M6281 Muscle weakness (generalized): Secondary | ICD-10-CM | POA: Diagnosis not present

## 2022-07-07 DIAGNOSIS — R2689 Other abnormalities of gait and mobility: Secondary | ICD-10-CM | POA: Diagnosis not present

## 2022-07-08 DIAGNOSIS — N3946 Mixed incontinence: Secondary | ICD-10-CM | POA: Diagnosis not present

## 2022-07-08 DIAGNOSIS — R2689 Other abnormalities of gait and mobility: Secondary | ICD-10-CM | POA: Diagnosis not present

## 2022-07-08 DIAGNOSIS — M6281 Muscle weakness (generalized): Secondary | ICD-10-CM | POA: Diagnosis not present

## 2022-07-08 DIAGNOSIS — E785 Hyperlipidemia, unspecified: Secondary | ICD-10-CM | POA: Diagnosis not present

## 2022-07-08 DIAGNOSIS — I10 Essential (primary) hypertension: Secondary | ICD-10-CM | POA: Diagnosis not present

## 2022-07-08 DIAGNOSIS — E039 Hypothyroidism, unspecified: Secondary | ICD-10-CM | POA: Diagnosis not present

## 2022-07-08 DIAGNOSIS — R279 Unspecified lack of coordination: Secondary | ICD-10-CM | POA: Diagnosis not present

## 2022-07-08 DIAGNOSIS — E1169 Type 2 diabetes mellitus with other specified complication: Secondary | ICD-10-CM | POA: Diagnosis not present

## 2022-07-09 DIAGNOSIS — R2689 Other abnormalities of gait and mobility: Secondary | ICD-10-CM | POA: Diagnosis not present

## 2022-07-09 DIAGNOSIS — M6281 Muscle weakness (generalized): Secondary | ICD-10-CM | POA: Diagnosis not present

## 2022-07-09 DIAGNOSIS — N3946 Mixed incontinence: Secondary | ICD-10-CM | POA: Diagnosis not present

## 2022-07-09 DIAGNOSIS — R279 Unspecified lack of coordination: Secondary | ICD-10-CM | POA: Diagnosis not present

## 2022-07-13 DIAGNOSIS — R2689 Other abnormalities of gait and mobility: Secondary | ICD-10-CM | POA: Diagnosis not present

## 2022-07-13 DIAGNOSIS — R279 Unspecified lack of coordination: Secondary | ICD-10-CM | POA: Diagnosis not present

## 2022-07-13 DIAGNOSIS — N3946 Mixed incontinence: Secondary | ICD-10-CM | POA: Diagnosis not present

## 2022-07-13 DIAGNOSIS — M6281 Muscle weakness (generalized): Secondary | ICD-10-CM | POA: Diagnosis not present

## 2022-07-14 DIAGNOSIS — R2689 Other abnormalities of gait and mobility: Secondary | ICD-10-CM | POA: Diagnosis not present

## 2022-07-14 DIAGNOSIS — R279 Unspecified lack of coordination: Secondary | ICD-10-CM | POA: Diagnosis not present

## 2022-07-14 DIAGNOSIS — N3946 Mixed incontinence: Secondary | ICD-10-CM | POA: Diagnosis not present

## 2022-07-14 DIAGNOSIS — M6281 Muscle weakness (generalized): Secondary | ICD-10-CM | POA: Diagnosis not present

## 2022-07-15 DIAGNOSIS — R2689 Other abnormalities of gait and mobility: Secondary | ICD-10-CM | POA: Diagnosis not present

## 2022-07-15 DIAGNOSIS — M6281 Muscle weakness (generalized): Secondary | ICD-10-CM | POA: Diagnosis not present

## 2022-07-15 DIAGNOSIS — N3946 Mixed incontinence: Secondary | ICD-10-CM | POA: Diagnosis not present

## 2022-07-15 DIAGNOSIS — R279 Unspecified lack of coordination: Secondary | ICD-10-CM | POA: Diagnosis not present

## 2022-07-16 DIAGNOSIS — R279 Unspecified lack of coordination: Secondary | ICD-10-CM | POA: Diagnosis not present

## 2022-07-16 DIAGNOSIS — R2689 Other abnormalities of gait and mobility: Secondary | ICD-10-CM | POA: Diagnosis not present

## 2022-07-16 DIAGNOSIS — M6281 Muscle weakness (generalized): Secondary | ICD-10-CM | POA: Diagnosis not present

## 2022-07-16 DIAGNOSIS — N3946 Mixed incontinence: Secondary | ICD-10-CM | POA: Diagnosis not present

## 2022-07-17 DIAGNOSIS — M6281 Muscle weakness (generalized): Secondary | ICD-10-CM | POA: Diagnosis not present

## 2022-07-17 DIAGNOSIS — R2689 Other abnormalities of gait and mobility: Secondary | ICD-10-CM | POA: Diagnosis not present

## 2022-07-17 DIAGNOSIS — R279 Unspecified lack of coordination: Secondary | ICD-10-CM | POA: Diagnosis not present

## 2022-07-17 DIAGNOSIS — N3946 Mixed incontinence: Secondary | ICD-10-CM | POA: Diagnosis not present

## 2022-07-20 DIAGNOSIS — R2689 Other abnormalities of gait and mobility: Secondary | ICD-10-CM | POA: Diagnosis not present

## 2022-07-20 DIAGNOSIS — R4789 Other speech disturbances: Secondary | ICD-10-CM | POA: Diagnosis not present

## 2022-07-20 DIAGNOSIS — M6281 Muscle weakness (generalized): Secondary | ICD-10-CM | POA: Diagnosis not present

## 2022-07-20 DIAGNOSIS — N3946 Mixed incontinence: Secondary | ICD-10-CM | POA: Diagnosis not present

## 2022-07-20 DIAGNOSIS — R279 Unspecified lack of coordination: Secondary | ICD-10-CM | POA: Diagnosis not present

## 2022-07-20 DIAGNOSIS — R488 Other symbolic dysfunctions: Secondary | ICD-10-CM | POA: Diagnosis not present

## 2022-07-20 DIAGNOSIS — R471 Dysarthria and anarthria: Secondary | ICD-10-CM | POA: Diagnosis not present

## 2022-07-21 DIAGNOSIS — N3946 Mixed incontinence: Secondary | ICD-10-CM | POA: Diagnosis not present

## 2022-07-21 DIAGNOSIS — R279 Unspecified lack of coordination: Secondary | ICD-10-CM | POA: Diagnosis not present

## 2022-07-21 DIAGNOSIS — R471 Dysarthria and anarthria: Secondary | ICD-10-CM | POA: Diagnosis not present

## 2022-07-21 DIAGNOSIS — R2689 Other abnormalities of gait and mobility: Secondary | ICD-10-CM | POA: Diagnosis not present

## 2022-07-21 DIAGNOSIS — R4789 Other speech disturbances: Secondary | ICD-10-CM | POA: Diagnosis not present

## 2022-07-21 DIAGNOSIS — M6281 Muscle weakness (generalized): Secondary | ICD-10-CM | POA: Diagnosis not present

## 2022-07-22 DIAGNOSIS — N3946 Mixed incontinence: Secondary | ICD-10-CM | POA: Diagnosis not present

## 2022-07-22 DIAGNOSIS — R279 Unspecified lack of coordination: Secondary | ICD-10-CM | POA: Diagnosis not present

## 2022-07-22 DIAGNOSIS — R4789 Other speech disturbances: Secondary | ICD-10-CM | POA: Diagnosis not present

## 2022-07-22 DIAGNOSIS — M6281 Muscle weakness (generalized): Secondary | ICD-10-CM | POA: Diagnosis not present

## 2022-07-22 DIAGNOSIS — R2689 Other abnormalities of gait and mobility: Secondary | ICD-10-CM | POA: Diagnosis not present

## 2022-07-22 DIAGNOSIS — R471 Dysarthria and anarthria: Secondary | ICD-10-CM | POA: Diagnosis not present

## 2022-07-23 DIAGNOSIS — M6281 Muscle weakness (generalized): Secondary | ICD-10-CM | POA: Diagnosis not present

## 2022-07-23 DIAGNOSIS — N3946 Mixed incontinence: Secondary | ICD-10-CM | POA: Diagnosis not present

## 2022-07-23 DIAGNOSIS — R2689 Other abnormalities of gait and mobility: Secondary | ICD-10-CM | POA: Diagnosis not present

## 2022-07-23 DIAGNOSIS — R4789 Other speech disturbances: Secondary | ICD-10-CM | POA: Diagnosis not present

## 2022-07-23 DIAGNOSIS — R279 Unspecified lack of coordination: Secondary | ICD-10-CM | POA: Diagnosis not present

## 2022-07-23 DIAGNOSIS — R471 Dysarthria and anarthria: Secondary | ICD-10-CM | POA: Diagnosis not present

## 2022-07-24 DIAGNOSIS — N3946 Mixed incontinence: Secondary | ICD-10-CM | POA: Diagnosis not present

## 2022-07-24 DIAGNOSIS — M6281 Muscle weakness (generalized): Secondary | ICD-10-CM | POA: Diagnosis not present

## 2022-07-24 DIAGNOSIS — R279 Unspecified lack of coordination: Secondary | ICD-10-CM | POA: Diagnosis not present

## 2022-07-24 DIAGNOSIS — R471 Dysarthria and anarthria: Secondary | ICD-10-CM | POA: Diagnosis not present

## 2022-07-24 DIAGNOSIS — R2689 Other abnormalities of gait and mobility: Secondary | ICD-10-CM | POA: Diagnosis not present

## 2022-07-24 DIAGNOSIS — R4789 Other speech disturbances: Secondary | ICD-10-CM | POA: Diagnosis not present

## 2022-07-26 DIAGNOSIS — R4789 Other speech disturbances: Secondary | ICD-10-CM | POA: Diagnosis not present

## 2022-07-26 DIAGNOSIS — N3946 Mixed incontinence: Secondary | ICD-10-CM | POA: Diagnosis not present

## 2022-07-26 DIAGNOSIS — M6281 Muscle weakness (generalized): Secondary | ICD-10-CM | POA: Diagnosis not present

## 2022-07-26 DIAGNOSIS — R471 Dysarthria and anarthria: Secondary | ICD-10-CM | POA: Diagnosis not present

## 2022-07-26 DIAGNOSIS — R2689 Other abnormalities of gait and mobility: Secondary | ICD-10-CM | POA: Diagnosis not present

## 2022-07-26 DIAGNOSIS — R279 Unspecified lack of coordination: Secondary | ICD-10-CM | POA: Diagnosis not present

## 2022-07-28 DIAGNOSIS — R4789 Other speech disturbances: Secondary | ICD-10-CM | POA: Diagnosis not present

## 2022-07-28 DIAGNOSIS — R279 Unspecified lack of coordination: Secondary | ICD-10-CM | POA: Diagnosis not present

## 2022-07-28 DIAGNOSIS — N3946 Mixed incontinence: Secondary | ICD-10-CM | POA: Diagnosis not present

## 2022-07-28 DIAGNOSIS — R2689 Other abnormalities of gait and mobility: Secondary | ICD-10-CM | POA: Diagnosis not present

## 2022-07-28 DIAGNOSIS — M6281 Muscle weakness (generalized): Secondary | ICD-10-CM | POA: Diagnosis not present

## 2022-07-28 DIAGNOSIS — R471 Dysarthria and anarthria: Secondary | ICD-10-CM | POA: Diagnosis not present

## 2022-07-29 DIAGNOSIS — R4789 Other speech disturbances: Secondary | ICD-10-CM | POA: Diagnosis not present

## 2022-07-29 DIAGNOSIS — R279 Unspecified lack of coordination: Secondary | ICD-10-CM | POA: Diagnosis not present

## 2022-07-29 DIAGNOSIS — R2689 Other abnormalities of gait and mobility: Secondary | ICD-10-CM | POA: Diagnosis not present

## 2022-07-29 DIAGNOSIS — R471 Dysarthria and anarthria: Secondary | ICD-10-CM | POA: Diagnosis not present

## 2022-07-29 DIAGNOSIS — M6281 Muscle weakness (generalized): Secondary | ICD-10-CM | POA: Diagnosis not present

## 2022-07-29 DIAGNOSIS — N3946 Mixed incontinence: Secondary | ICD-10-CM | POA: Diagnosis not present

## 2022-07-30 DIAGNOSIS — R2689 Other abnormalities of gait and mobility: Secondary | ICD-10-CM | POA: Diagnosis not present

## 2022-07-30 DIAGNOSIS — M6281 Muscle weakness (generalized): Secondary | ICD-10-CM | POA: Diagnosis not present

## 2022-07-30 DIAGNOSIS — R4789 Other speech disturbances: Secondary | ICD-10-CM | POA: Diagnosis not present

## 2022-07-30 DIAGNOSIS — R471 Dysarthria and anarthria: Secondary | ICD-10-CM | POA: Diagnosis not present

## 2022-07-30 DIAGNOSIS — R279 Unspecified lack of coordination: Secondary | ICD-10-CM | POA: Diagnosis not present

## 2022-07-30 DIAGNOSIS — N3946 Mixed incontinence: Secondary | ICD-10-CM | POA: Diagnosis not present

## 2022-07-31 DIAGNOSIS — R4789 Other speech disturbances: Secondary | ICD-10-CM | POA: Diagnosis not present

## 2022-07-31 DIAGNOSIS — M6281 Muscle weakness (generalized): Secondary | ICD-10-CM | POA: Diagnosis not present

## 2022-07-31 DIAGNOSIS — N3946 Mixed incontinence: Secondary | ICD-10-CM | POA: Diagnosis not present

## 2022-07-31 DIAGNOSIS — R471 Dysarthria and anarthria: Secondary | ICD-10-CM | POA: Diagnosis not present

## 2022-07-31 DIAGNOSIS — R2689 Other abnormalities of gait and mobility: Secondary | ICD-10-CM | POA: Diagnosis not present

## 2022-07-31 DIAGNOSIS — R279 Unspecified lack of coordination: Secondary | ICD-10-CM | POA: Diagnosis not present

## 2022-08-03 DIAGNOSIS — U071 COVID-19: Secondary | ICD-10-CM | POA: Diagnosis not present

## 2022-08-04 DIAGNOSIS — R279 Unspecified lack of coordination: Secondary | ICD-10-CM | POA: Diagnosis not present

## 2022-08-04 DIAGNOSIS — M6281 Muscle weakness (generalized): Secondary | ICD-10-CM | POA: Diagnosis not present

## 2022-08-04 DIAGNOSIS — R471 Dysarthria and anarthria: Secondary | ICD-10-CM | POA: Diagnosis not present

## 2022-08-04 DIAGNOSIS — R2689 Other abnormalities of gait and mobility: Secondary | ICD-10-CM | POA: Diagnosis not present

## 2022-08-04 DIAGNOSIS — R4789 Other speech disturbances: Secondary | ICD-10-CM | POA: Diagnosis not present

## 2022-08-04 DIAGNOSIS — N3946 Mixed incontinence: Secondary | ICD-10-CM | POA: Diagnosis not present

## 2022-08-10 DIAGNOSIS — R4789 Other speech disturbances: Secondary | ICD-10-CM | POA: Diagnosis not present

## 2022-08-10 DIAGNOSIS — R471 Dysarthria and anarthria: Secondary | ICD-10-CM | POA: Diagnosis not present

## 2022-08-10 DIAGNOSIS — M6281 Muscle weakness (generalized): Secondary | ICD-10-CM | POA: Diagnosis not present

## 2022-08-10 DIAGNOSIS — R2689 Other abnormalities of gait and mobility: Secondary | ICD-10-CM | POA: Diagnosis not present

## 2022-08-10 DIAGNOSIS — R279 Unspecified lack of coordination: Secondary | ICD-10-CM | POA: Diagnosis not present

## 2022-08-10 DIAGNOSIS — N3946 Mixed incontinence: Secondary | ICD-10-CM | POA: Diagnosis not present

## 2022-08-11 DIAGNOSIS — M6281 Muscle weakness (generalized): Secondary | ICD-10-CM | POA: Diagnosis not present

## 2022-08-11 DIAGNOSIS — R4789 Other speech disturbances: Secondary | ICD-10-CM | POA: Diagnosis not present

## 2022-08-11 DIAGNOSIS — R2689 Other abnormalities of gait and mobility: Secondary | ICD-10-CM | POA: Diagnosis not present

## 2022-08-11 DIAGNOSIS — N3946 Mixed incontinence: Secondary | ICD-10-CM | POA: Diagnosis not present

## 2022-08-11 DIAGNOSIS — R471 Dysarthria and anarthria: Secondary | ICD-10-CM | POA: Diagnosis not present

## 2022-08-11 DIAGNOSIS — R279 Unspecified lack of coordination: Secondary | ICD-10-CM | POA: Diagnosis not present

## 2022-08-12 DIAGNOSIS — R4789 Other speech disturbances: Secondary | ICD-10-CM | POA: Diagnosis not present

## 2022-08-12 DIAGNOSIS — R471 Dysarthria and anarthria: Secondary | ICD-10-CM | POA: Diagnosis not present

## 2022-08-12 DIAGNOSIS — R2689 Other abnormalities of gait and mobility: Secondary | ICD-10-CM | POA: Diagnosis not present

## 2022-08-12 DIAGNOSIS — N3946 Mixed incontinence: Secondary | ICD-10-CM | POA: Diagnosis not present

## 2022-08-12 DIAGNOSIS — M6281 Muscle weakness (generalized): Secondary | ICD-10-CM | POA: Diagnosis not present

## 2022-08-12 DIAGNOSIS — R279 Unspecified lack of coordination: Secondary | ICD-10-CM | POA: Diagnosis not present

## 2022-08-13 DIAGNOSIS — M6281 Muscle weakness (generalized): Secondary | ICD-10-CM | POA: Diagnosis not present

## 2022-08-13 DIAGNOSIS — R2689 Other abnormalities of gait and mobility: Secondary | ICD-10-CM | POA: Diagnosis not present

## 2022-08-13 DIAGNOSIS — R279 Unspecified lack of coordination: Secondary | ICD-10-CM | POA: Diagnosis not present

## 2022-08-13 DIAGNOSIS — R4789 Other speech disturbances: Secondary | ICD-10-CM | POA: Diagnosis not present

## 2022-08-13 DIAGNOSIS — N3946 Mixed incontinence: Secondary | ICD-10-CM | POA: Diagnosis not present

## 2022-08-13 DIAGNOSIS — R471 Dysarthria and anarthria: Secondary | ICD-10-CM | POA: Diagnosis not present

## 2022-08-14 DIAGNOSIS — R471 Dysarthria and anarthria: Secondary | ICD-10-CM | POA: Diagnosis not present

## 2022-08-14 DIAGNOSIS — N3946 Mixed incontinence: Secondary | ICD-10-CM | POA: Diagnosis not present

## 2022-08-14 DIAGNOSIS — R4789 Other speech disturbances: Secondary | ICD-10-CM | POA: Diagnosis not present

## 2022-08-14 DIAGNOSIS — M6281 Muscle weakness (generalized): Secondary | ICD-10-CM | POA: Diagnosis not present

## 2022-08-14 DIAGNOSIS — R279 Unspecified lack of coordination: Secondary | ICD-10-CM | POA: Diagnosis not present

## 2022-08-14 DIAGNOSIS — R2689 Other abnormalities of gait and mobility: Secondary | ICD-10-CM | POA: Diagnosis not present

## 2022-08-17 DIAGNOSIS — M6281 Muscle weakness (generalized): Secondary | ICD-10-CM | POA: Diagnosis not present

## 2022-08-17 DIAGNOSIS — R4789 Other speech disturbances: Secondary | ICD-10-CM | POA: Diagnosis not present

## 2022-08-17 DIAGNOSIS — R471 Dysarthria and anarthria: Secondary | ICD-10-CM | POA: Diagnosis not present

## 2022-08-17 DIAGNOSIS — N3946 Mixed incontinence: Secondary | ICD-10-CM | POA: Diagnosis not present

## 2022-08-17 DIAGNOSIS — R279 Unspecified lack of coordination: Secondary | ICD-10-CM | POA: Diagnosis not present

## 2022-08-17 DIAGNOSIS — R2689 Other abnormalities of gait and mobility: Secondary | ICD-10-CM | POA: Diagnosis not present

## 2022-08-18 DIAGNOSIS — R4789 Other speech disturbances: Secondary | ICD-10-CM | POA: Diagnosis not present

## 2022-08-18 DIAGNOSIS — R2689 Other abnormalities of gait and mobility: Secondary | ICD-10-CM | POA: Diagnosis not present

## 2022-08-18 DIAGNOSIS — R471 Dysarthria and anarthria: Secondary | ICD-10-CM | POA: Diagnosis not present

## 2022-08-18 DIAGNOSIS — M6281 Muscle weakness (generalized): Secondary | ICD-10-CM | POA: Diagnosis not present

## 2022-08-18 DIAGNOSIS — N3946 Mixed incontinence: Secondary | ICD-10-CM | POA: Diagnosis not present

## 2022-08-18 DIAGNOSIS — R279 Unspecified lack of coordination: Secondary | ICD-10-CM | POA: Diagnosis not present

## 2022-08-19 DIAGNOSIS — N3946 Mixed incontinence: Secondary | ICD-10-CM | POA: Diagnosis not present

## 2022-08-19 DIAGNOSIS — R2689 Other abnormalities of gait and mobility: Secondary | ICD-10-CM | POA: Diagnosis not present

## 2022-08-19 DIAGNOSIS — R488 Other symbolic dysfunctions: Secondary | ICD-10-CM | POA: Diagnosis not present

## 2022-08-19 DIAGNOSIS — R471 Dysarthria and anarthria: Secondary | ICD-10-CM | POA: Diagnosis not present

## 2022-08-19 DIAGNOSIS — M6281 Muscle weakness (generalized): Secondary | ICD-10-CM | POA: Diagnosis not present

## 2022-08-19 DIAGNOSIS — R4789 Other speech disturbances: Secondary | ICD-10-CM | POA: Diagnosis not present

## 2022-08-19 DIAGNOSIS — R279 Unspecified lack of coordination: Secondary | ICD-10-CM | POA: Diagnosis not present

## 2022-08-20 DIAGNOSIS — R2689 Other abnormalities of gait and mobility: Secondary | ICD-10-CM | POA: Diagnosis not present

## 2022-08-20 DIAGNOSIS — M6281 Muscle weakness (generalized): Secondary | ICD-10-CM | POA: Diagnosis not present

## 2022-08-20 DIAGNOSIS — N3946 Mixed incontinence: Secondary | ICD-10-CM | POA: Diagnosis not present

## 2022-08-20 DIAGNOSIS — R279 Unspecified lack of coordination: Secondary | ICD-10-CM | POA: Diagnosis not present

## 2022-08-20 DIAGNOSIS — R471 Dysarthria and anarthria: Secondary | ICD-10-CM | POA: Diagnosis not present

## 2022-08-20 DIAGNOSIS — R4789 Other speech disturbances: Secondary | ICD-10-CM | POA: Diagnosis not present

## 2022-08-21 DIAGNOSIS — F200 Paranoid schizophrenia: Secondary | ICD-10-CM | POA: Diagnosis not present

## 2022-08-21 DIAGNOSIS — R279 Unspecified lack of coordination: Secondary | ICD-10-CM | POA: Diagnosis not present

## 2022-08-21 DIAGNOSIS — G3184 Mild cognitive impairment, so stated: Secondary | ICD-10-CM | POA: Diagnosis not present

## 2022-08-21 DIAGNOSIS — M6281 Muscle weakness (generalized): Secondary | ICD-10-CM | POA: Diagnosis not present

## 2022-08-21 DIAGNOSIS — R4789 Other speech disturbances: Secondary | ICD-10-CM | POA: Diagnosis not present

## 2022-08-21 DIAGNOSIS — R2689 Other abnormalities of gait and mobility: Secondary | ICD-10-CM | POA: Diagnosis not present

## 2022-08-21 DIAGNOSIS — N3946 Mixed incontinence: Secondary | ICD-10-CM | POA: Diagnosis not present

## 2022-08-21 DIAGNOSIS — R471 Dysarthria and anarthria: Secondary | ICD-10-CM | POA: Diagnosis not present

## 2022-08-24 DIAGNOSIS — M6281 Muscle weakness (generalized): Secondary | ICD-10-CM | POA: Diagnosis not present

## 2022-08-24 DIAGNOSIS — R4789 Other speech disturbances: Secondary | ICD-10-CM | POA: Diagnosis not present

## 2022-08-24 DIAGNOSIS — R2689 Other abnormalities of gait and mobility: Secondary | ICD-10-CM | POA: Diagnosis not present

## 2022-08-24 DIAGNOSIS — N3946 Mixed incontinence: Secondary | ICD-10-CM | POA: Diagnosis not present

## 2022-08-24 DIAGNOSIS — R471 Dysarthria and anarthria: Secondary | ICD-10-CM | POA: Diagnosis not present

## 2022-08-24 DIAGNOSIS — R279 Unspecified lack of coordination: Secondary | ICD-10-CM | POA: Diagnosis not present

## 2022-08-25 DIAGNOSIS — G3184 Mild cognitive impairment, so stated: Secondary | ICD-10-CM | POA: Diagnosis not present

## 2022-08-25 DIAGNOSIS — F200 Paranoid schizophrenia: Secondary | ICD-10-CM | POA: Diagnosis not present

## 2022-08-25 DIAGNOSIS — R4789 Other speech disturbances: Secondary | ICD-10-CM | POA: Diagnosis not present

## 2022-08-25 DIAGNOSIS — N3946 Mixed incontinence: Secondary | ICD-10-CM | POA: Diagnosis not present

## 2022-08-25 DIAGNOSIS — R279 Unspecified lack of coordination: Secondary | ICD-10-CM | POA: Diagnosis not present

## 2022-08-25 DIAGNOSIS — R471 Dysarthria and anarthria: Secondary | ICD-10-CM | POA: Diagnosis not present

## 2022-08-25 DIAGNOSIS — R2689 Other abnormalities of gait and mobility: Secondary | ICD-10-CM | POA: Diagnosis not present

## 2022-08-25 DIAGNOSIS — M6281 Muscle weakness (generalized): Secondary | ICD-10-CM | POA: Diagnosis not present

## 2022-08-26 DIAGNOSIS — R471 Dysarthria and anarthria: Secondary | ICD-10-CM | POA: Diagnosis not present

## 2022-08-26 DIAGNOSIS — M6281 Muscle weakness (generalized): Secondary | ICD-10-CM | POA: Diagnosis not present

## 2022-08-26 DIAGNOSIS — N3946 Mixed incontinence: Secondary | ICD-10-CM | POA: Diagnosis not present

## 2022-08-26 DIAGNOSIS — R2689 Other abnormalities of gait and mobility: Secondary | ICD-10-CM | POA: Diagnosis not present

## 2022-08-26 DIAGNOSIS — R4789 Other speech disturbances: Secondary | ICD-10-CM | POA: Diagnosis not present

## 2022-08-26 DIAGNOSIS — R279 Unspecified lack of coordination: Secondary | ICD-10-CM | POA: Diagnosis not present

## 2022-08-27 DIAGNOSIS — M6281 Muscle weakness (generalized): Secondary | ICD-10-CM | POA: Diagnosis not present

## 2022-08-27 DIAGNOSIS — R4789 Other speech disturbances: Secondary | ICD-10-CM | POA: Diagnosis not present

## 2022-08-27 DIAGNOSIS — N3946 Mixed incontinence: Secondary | ICD-10-CM | POA: Diagnosis not present

## 2022-08-27 DIAGNOSIS — R2689 Other abnormalities of gait and mobility: Secondary | ICD-10-CM | POA: Diagnosis not present

## 2022-08-27 DIAGNOSIS — R471 Dysarthria and anarthria: Secondary | ICD-10-CM | POA: Diagnosis not present

## 2022-08-27 DIAGNOSIS — R279 Unspecified lack of coordination: Secondary | ICD-10-CM | POA: Diagnosis not present

## 2022-08-28 DIAGNOSIS — N3946 Mixed incontinence: Secondary | ICD-10-CM | POA: Diagnosis not present

## 2022-08-28 DIAGNOSIS — M6281 Muscle weakness (generalized): Secondary | ICD-10-CM | POA: Diagnosis not present

## 2022-08-28 DIAGNOSIS — R2689 Other abnormalities of gait and mobility: Secondary | ICD-10-CM | POA: Diagnosis not present

## 2022-08-28 DIAGNOSIS — R4789 Other speech disturbances: Secondary | ICD-10-CM | POA: Diagnosis not present

## 2022-08-28 DIAGNOSIS — R279 Unspecified lack of coordination: Secondary | ICD-10-CM | POA: Diagnosis not present

## 2022-08-28 DIAGNOSIS — R471 Dysarthria and anarthria: Secondary | ICD-10-CM | POA: Diagnosis not present

## 2022-08-31 DIAGNOSIS — R4789 Other speech disturbances: Secondary | ICD-10-CM | POA: Diagnosis not present

## 2022-08-31 DIAGNOSIS — R471 Dysarthria and anarthria: Secondary | ICD-10-CM | POA: Diagnosis not present

## 2022-08-31 DIAGNOSIS — R2689 Other abnormalities of gait and mobility: Secondary | ICD-10-CM | POA: Diagnosis not present

## 2022-08-31 DIAGNOSIS — N3946 Mixed incontinence: Secondary | ICD-10-CM | POA: Diagnosis not present

## 2022-08-31 DIAGNOSIS — M6281 Muscle weakness (generalized): Secondary | ICD-10-CM | POA: Diagnosis not present

## 2022-08-31 DIAGNOSIS — R279 Unspecified lack of coordination: Secondary | ICD-10-CM | POA: Diagnosis not present

## 2022-09-01 DIAGNOSIS — R4789 Other speech disturbances: Secondary | ICD-10-CM | POA: Diagnosis not present

## 2022-09-01 DIAGNOSIS — R279 Unspecified lack of coordination: Secondary | ICD-10-CM | POA: Diagnosis not present

## 2022-09-01 DIAGNOSIS — N3946 Mixed incontinence: Secondary | ICD-10-CM | POA: Diagnosis not present

## 2022-09-01 DIAGNOSIS — R471 Dysarthria and anarthria: Secondary | ICD-10-CM | POA: Diagnosis not present

## 2022-09-01 DIAGNOSIS — R2689 Other abnormalities of gait and mobility: Secondary | ICD-10-CM | POA: Diagnosis not present

## 2022-09-01 DIAGNOSIS — M6281 Muscle weakness (generalized): Secondary | ICD-10-CM | POA: Diagnosis not present

## 2022-09-02 DIAGNOSIS — N3946 Mixed incontinence: Secondary | ICD-10-CM | POA: Diagnosis not present

## 2022-09-02 DIAGNOSIS — R4789 Other speech disturbances: Secondary | ICD-10-CM | POA: Diagnosis not present

## 2022-09-02 DIAGNOSIS — R2689 Other abnormalities of gait and mobility: Secondary | ICD-10-CM | POA: Diagnosis not present

## 2022-09-02 DIAGNOSIS — M6281 Muscle weakness (generalized): Secondary | ICD-10-CM | POA: Diagnosis not present

## 2022-09-02 DIAGNOSIS — R471 Dysarthria and anarthria: Secondary | ICD-10-CM | POA: Diagnosis not present

## 2022-09-02 DIAGNOSIS — R279 Unspecified lack of coordination: Secondary | ICD-10-CM | POA: Diagnosis not present

## 2022-09-03 DIAGNOSIS — R2689 Other abnormalities of gait and mobility: Secondary | ICD-10-CM | POA: Diagnosis not present

## 2022-09-03 DIAGNOSIS — R471 Dysarthria and anarthria: Secondary | ICD-10-CM | POA: Diagnosis not present

## 2022-09-03 DIAGNOSIS — M6281 Muscle weakness (generalized): Secondary | ICD-10-CM | POA: Diagnosis not present

## 2022-09-03 DIAGNOSIS — R279 Unspecified lack of coordination: Secondary | ICD-10-CM | POA: Diagnosis not present

## 2022-09-03 DIAGNOSIS — N3946 Mixed incontinence: Secondary | ICD-10-CM | POA: Diagnosis not present

## 2022-09-03 DIAGNOSIS — R4789 Other speech disturbances: Secondary | ICD-10-CM | POA: Diagnosis not present

## 2022-09-04 DIAGNOSIS — M6281 Muscle weakness (generalized): Secondary | ICD-10-CM | POA: Diagnosis not present

## 2022-09-04 DIAGNOSIS — R4789 Other speech disturbances: Secondary | ICD-10-CM | POA: Diagnosis not present

## 2022-09-04 DIAGNOSIS — R279 Unspecified lack of coordination: Secondary | ICD-10-CM | POA: Diagnosis not present

## 2022-09-04 DIAGNOSIS — N3946 Mixed incontinence: Secondary | ICD-10-CM | POA: Diagnosis not present

## 2022-09-04 DIAGNOSIS — R2689 Other abnormalities of gait and mobility: Secondary | ICD-10-CM | POA: Diagnosis not present

## 2022-09-04 DIAGNOSIS — R471 Dysarthria and anarthria: Secondary | ICD-10-CM | POA: Diagnosis not present

## 2022-09-07 DIAGNOSIS — R2689 Other abnormalities of gait and mobility: Secondary | ICD-10-CM | POA: Diagnosis not present

## 2022-09-07 DIAGNOSIS — M6281 Muscle weakness (generalized): Secondary | ICD-10-CM | POA: Diagnosis not present

## 2022-09-07 DIAGNOSIS — R4789 Other speech disturbances: Secondary | ICD-10-CM | POA: Diagnosis not present

## 2022-09-07 DIAGNOSIS — N3946 Mixed incontinence: Secondary | ICD-10-CM | POA: Diagnosis not present

## 2022-09-07 DIAGNOSIS — R279 Unspecified lack of coordination: Secondary | ICD-10-CM | POA: Diagnosis not present

## 2022-09-07 DIAGNOSIS — R471 Dysarthria and anarthria: Secondary | ICD-10-CM | POA: Diagnosis not present

## 2022-09-08 DIAGNOSIS — N3946 Mixed incontinence: Secondary | ICD-10-CM | POA: Diagnosis not present

## 2022-09-08 DIAGNOSIS — M6281 Muscle weakness (generalized): Secondary | ICD-10-CM | POA: Diagnosis not present

## 2022-09-08 DIAGNOSIS — R471 Dysarthria and anarthria: Secondary | ICD-10-CM | POA: Diagnosis not present

## 2022-09-08 DIAGNOSIS — R4789 Other speech disturbances: Secondary | ICD-10-CM | POA: Diagnosis not present

## 2022-09-08 DIAGNOSIS — R279 Unspecified lack of coordination: Secondary | ICD-10-CM | POA: Diagnosis not present

## 2022-09-08 DIAGNOSIS — R2689 Other abnormalities of gait and mobility: Secondary | ICD-10-CM | POA: Diagnosis not present

## 2022-09-09 DIAGNOSIS — M6281 Muscle weakness (generalized): Secondary | ICD-10-CM | POA: Diagnosis not present

## 2022-09-09 DIAGNOSIS — R279 Unspecified lack of coordination: Secondary | ICD-10-CM | POA: Diagnosis not present

## 2022-09-09 DIAGNOSIS — R471 Dysarthria and anarthria: Secondary | ICD-10-CM | POA: Diagnosis not present

## 2022-09-09 DIAGNOSIS — R2689 Other abnormalities of gait and mobility: Secondary | ICD-10-CM | POA: Diagnosis not present

## 2022-09-09 DIAGNOSIS — R4789 Other speech disturbances: Secondary | ICD-10-CM | POA: Diagnosis not present

## 2022-09-09 DIAGNOSIS — N3946 Mixed incontinence: Secondary | ICD-10-CM | POA: Diagnosis not present

## 2022-09-10 DIAGNOSIS — N3946 Mixed incontinence: Secondary | ICD-10-CM | POA: Diagnosis not present

## 2022-09-10 DIAGNOSIS — M6281 Muscle weakness (generalized): Secondary | ICD-10-CM | POA: Diagnosis not present

## 2022-09-10 DIAGNOSIS — R279 Unspecified lack of coordination: Secondary | ICD-10-CM | POA: Diagnosis not present

## 2022-09-10 DIAGNOSIS — R4789 Other speech disturbances: Secondary | ICD-10-CM | POA: Diagnosis not present

## 2022-09-10 DIAGNOSIS — R471 Dysarthria and anarthria: Secondary | ICD-10-CM | POA: Diagnosis not present

## 2022-09-10 DIAGNOSIS — R2689 Other abnormalities of gait and mobility: Secondary | ICD-10-CM | POA: Diagnosis not present

## 2022-09-11 DIAGNOSIS — R4789 Other speech disturbances: Secondary | ICD-10-CM | POA: Diagnosis not present

## 2022-09-11 DIAGNOSIS — R2689 Other abnormalities of gait and mobility: Secondary | ICD-10-CM | POA: Diagnosis not present

## 2022-09-11 DIAGNOSIS — N3946 Mixed incontinence: Secondary | ICD-10-CM | POA: Diagnosis not present

## 2022-09-11 DIAGNOSIS — M6281 Muscle weakness (generalized): Secondary | ICD-10-CM | POA: Diagnosis not present

## 2022-09-11 DIAGNOSIS — R279 Unspecified lack of coordination: Secondary | ICD-10-CM | POA: Diagnosis not present

## 2022-09-11 DIAGNOSIS — R471 Dysarthria and anarthria: Secondary | ICD-10-CM | POA: Diagnosis not present

## 2022-09-15 DIAGNOSIS — R2689 Other abnormalities of gait and mobility: Secondary | ICD-10-CM | POA: Diagnosis not present

## 2022-09-15 DIAGNOSIS — R279 Unspecified lack of coordination: Secondary | ICD-10-CM | POA: Diagnosis not present

## 2022-09-15 DIAGNOSIS — N3946 Mixed incontinence: Secondary | ICD-10-CM | POA: Diagnosis not present

## 2022-09-15 DIAGNOSIS — R4789 Other speech disturbances: Secondary | ICD-10-CM | POA: Diagnosis not present

## 2022-09-15 DIAGNOSIS — M6281 Muscle weakness (generalized): Secondary | ICD-10-CM | POA: Diagnosis not present

## 2022-09-15 DIAGNOSIS — R471 Dysarthria and anarthria: Secondary | ICD-10-CM | POA: Diagnosis not present

## 2022-09-16 DIAGNOSIS — M6281 Muscle weakness (generalized): Secondary | ICD-10-CM | POA: Diagnosis not present

## 2022-09-16 DIAGNOSIS — R471 Dysarthria and anarthria: Secondary | ICD-10-CM | POA: Diagnosis not present

## 2022-09-16 DIAGNOSIS — R2689 Other abnormalities of gait and mobility: Secondary | ICD-10-CM | POA: Diagnosis not present

## 2022-09-16 DIAGNOSIS — R279 Unspecified lack of coordination: Secondary | ICD-10-CM | POA: Diagnosis not present

## 2022-09-16 DIAGNOSIS — N3946 Mixed incontinence: Secondary | ICD-10-CM | POA: Diagnosis not present

## 2022-09-16 DIAGNOSIS — R4789 Other speech disturbances: Secondary | ICD-10-CM | POA: Diagnosis not present

## 2022-09-17 DIAGNOSIS — M6281 Muscle weakness (generalized): Secondary | ICD-10-CM | POA: Diagnosis not present

## 2022-09-17 DIAGNOSIS — R471 Dysarthria and anarthria: Secondary | ICD-10-CM | POA: Diagnosis not present

## 2022-09-17 DIAGNOSIS — N3946 Mixed incontinence: Secondary | ICD-10-CM | POA: Diagnosis not present

## 2022-09-17 DIAGNOSIS — R279 Unspecified lack of coordination: Secondary | ICD-10-CM | POA: Diagnosis not present

## 2022-09-17 DIAGNOSIS — R2689 Other abnormalities of gait and mobility: Secondary | ICD-10-CM | POA: Diagnosis not present

## 2022-09-17 DIAGNOSIS — R4789 Other speech disturbances: Secondary | ICD-10-CM | POA: Diagnosis not present

## 2022-09-18 DIAGNOSIS — M6281 Muscle weakness (generalized): Secondary | ICD-10-CM | POA: Diagnosis not present

## 2022-09-18 DIAGNOSIS — R471 Dysarthria and anarthria: Secondary | ICD-10-CM | POA: Diagnosis not present

## 2022-09-18 DIAGNOSIS — N3946 Mixed incontinence: Secondary | ICD-10-CM | POA: Diagnosis not present

## 2022-09-18 DIAGNOSIS — R2689 Other abnormalities of gait and mobility: Secondary | ICD-10-CM | POA: Diagnosis not present

## 2022-09-18 DIAGNOSIS — R4789 Other speech disturbances: Secondary | ICD-10-CM | POA: Diagnosis not present

## 2022-09-18 DIAGNOSIS — R279 Unspecified lack of coordination: Secondary | ICD-10-CM | POA: Diagnosis not present

## 2022-09-21 DIAGNOSIS — R279 Unspecified lack of coordination: Secondary | ICD-10-CM | POA: Diagnosis not present

## 2022-09-21 DIAGNOSIS — R471 Dysarthria and anarthria: Secondary | ICD-10-CM | POA: Diagnosis not present

## 2022-09-21 DIAGNOSIS — R4789 Other speech disturbances: Secondary | ICD-10-CM | POA: Diagnosis not present

## 2022-09-21 DIAGNOSIS — R2689 Other abnormalities of gait and mobility: Secondary | ICD-10-CM | POA: Diagnosis not present

## 2022-09-21 DIAGNOSIS — M6281 Muscle weakness (generalized): Secondary | ICD-10-CM | POA: Diagnosis not present

## 2022-09-21 DIAGNOSIS — N3946 Mixed incontinence: Secondary | ICD-10-CM | POA: Diagnosis not present

## 2022-09-21 DIAGNOSIS — R488 Other symbolic dysfunctions: Secondary | ICD-10-CM | POA: Diagnosis not present

## 2022-09-22 DIAGNOSIS — R471 Dysarthria and anarthria: Secondary | ICD-10-CM | POA: Diagnosis not present

## 2022-09-22 DIAGNOSIS — N3946 Mixed incontinence: Secondary | ICD-10-CM | POA: Diagnosis not present

## 2022-09-22 DIAGNOSIS — M6281 Muscle weakness (generalized): Secondary | ICD-10-CM | POA: Diagnosis not present

## 2022-09-22 DIAGNOSIS — R2689 Other abnormalities of gait and mobility: Secondary | ICD-10-CM | POA: Diagnosis not present

## 2022-09-22 DIAGNOSIS — R4789 Other speech disturbances: Secondary | ICD-10-CM | POA: Diagnosis not present

## 2022-09-22 DIAGNOSIS — R279 Unspecified lack of coordination: Secondary | ICD-10-CM | POA: Diagnosis not present

## 2022-09-23 DIAGNOSIS — R471 Dysarthria and anarthria: Secondary | ICD-10-CM | POA: Diagnosis not present

## 2022-09-23 DIAGNOSIS — M6281 Muscle weakness (generalized): Secondary | ICD-10-CM | POA: Diagnosis not present

## 2022-09-23 DIAGNOSIS — R279 Unspecified lack of coordination: Secondary | ICD-10-CM | POA: Diagnosis not present

## 2022-09-23 DIAGNOSIS — R4789 Other speech disturbances: Secondary | ICD-10-CM | POA: Diagnosis not present

## 2022-09-23 DIAGNOSIS — R2689 Other abnormalities of gait and mobility: Secondary | ICD-10-CM | POA: Diagnosis not present

## 2022-09-23 DIAGNOSIS — N3946 Mixed incontinence: Secondary | ICD-10-CM | POA: Diagnosis not present

## 2022-09-24 DIAGNOSIS — N3946 Mixed incontinence: Secondary | ICD-10-CM | POA: Diagnosis not present

## 2022-09-24 DIAGNOSIS — M6281 Muscle weakness (generalized): Secondary | ICD-10-CM | POA: Diagnosis not present

## 2022-09-24 DIAGNOSIS — R471 Dysarthria and anarthria: Secondary | ICD-10-CM | POA: Diagnosis not present

## 2022-09-24 DIAGNOSIS — R279 Unspecified lack of coordination: Secondary | ICD-10-CM | POA: Diagnosis not present

## 2022-09-24 DIAGNOSIS — R4789 Other speech disturbances: Secondary | ICD-10-CM | POA: Diagnosis not present

## 2022-09-24 DIAGNOSIS — R2689 Other abnormalities of gait and mobility: Secondary | ICD-10-CM | POA: Diagnosis not present

## 2022-09-25 DIAGNOSIS — R279 Unspecified lack of coordination: Secondary | ICD-10-CM | POA: Diagnosis not present

## 2022-09-25 DIAGNOSIS — N3946 Mixed incontinence: Secondary | ICD-10-CM | POA: Diagnosis not present

## 2022-09-25 DIAGNOSIS — R4789 Other speech disturbances: Secondary | ICD-10-CM | POA: Diagnosis not present

## 2022-09-25 DIAGNOSIS — M6281 Muscle weakness (generalized): Secondary | ICD-10-CM | POA: Diagnosis not present

## 2022-09-25 DIAGNOSIS — R471 Dysarthria and anarthria: Secondary | ICD-10-CM | POA: Diagnosis not present

## 2022-09-25 DIAGNOSIS — R2689 Other abnormalities of gait and mobility: Secondary | ICD-10-CM | POA: Diagnosis not present

## 2022-09-28 DIAGNOSIS — R4789 Other speech disturbances: Secondary | ICD-10-CM | POA: Diagnosis not present

## 2022-09-28 DIAGNOSIS — M6281 Muscle weakness (generalized): Secondary | ICD-10-CM | POA: Diagnosis not present

## 2022-09-28 DIAGNOSIS — R471 Dysarthria and anarthria: Secondary | ICD-10-CM | POA: Diagnosis not present

## 2022-09-28 DIAGNOSIS — R2689 Other abnormalities of gait and mobility: Secondary | ICD-10-CM | POA: Diagnosis not present

## 2022-09-28 DIAGNOSIS — R279 Unspecified lack of coordination: Secondary | ICD-10-CM | POA: Diagnosis not present

## 2022-09-28 DIAGNOSIS — N3946 Mixed incontinence: Secondary | ICD-10-CM | POA: Diagnosis not present

## 2022-09-29 DIAGNOSIS — R471 Dysarthria and anarthria: Secondary | ICD-10-CM | POA: Diagnosis not present

## 2022-09-29 DIAGNOSIS — N3946 Mixed incontinence: Secondary | ICD-10-CM | POA: Diagnosis not present

## 2022-09-29 DIAGNOSIS — R2689 Other abnormalities of gait and mobility: Secondary | ICD-10-CM | POA: Diagnosis not present

## 2022-09-29 DIAGNOSIS — R4789 Other speech disturbances: Secondary | ICD-10-CM | POA: Diagnosis not present

## 2022-09-29 DIAGNOSIS — R279 Unspecified lack of coordination: Secondary | ICD-10-CM | POA: Diagnosis not present

## 2022-09-29 DIAGNOSIS — M6281 Muscle weakness (generalized): Secondary | ICD-10-CM | POA: Diagnosis not present

## 2022-09-30 ENCOUNTER — Other Ambulatory Visit: Payer: Self-pay | Admitting: Family Medicine

## 2022-09-30 DIAGNOSIS — R471 Dysarthria and anarthria: Secondary | ICD-10-CM | POA: Diagnosis not present

## 2022-09-30 DIAGNOSIS — R2689 Other abnormalities of gait and mobility: Secondary | ICD-10-CM | POA: Diagnosis not present

## 2022-09-30 DIAGNOSIS — R9389 Abnormal findings on diagnostic imaging of other specified body structures: Secondary | ICD-10-CM

## 2022-09-30 DIAGNOSIS — M6281 Muscle weakness (generalized): Secondary | ICD-10-CM | POA: Diagnosis not present

## 2022-09-30 DIAGNOSIS — R279 Unspecified lack of coordination: Secondary | ICD-10-CM | POA: Diagnosis not present

## 2022-09-30 DIAGNOSIS — N3946 Mixed incontinence: Secondary | ICD-10-CM | POA: Diagnosis not present

## 2022-09-30 DIAGNOSIS — R4789 Other speech disturbances: Secondary | ICD-10-CM | POA: Diagnosis not present

## 2022-10-01 DIAGNOSIS — R4789 Other speech disturbances: Secondary | ICD-10-CM | POA: Diagnosis not present

## 2022-10-01 DIAGNOSIS — R2689 Other abnormalities of gait and mobility: Secondary | ICD-10-CM | POA: Diagnosis not present

## 2022-10-01 DIAGNOSIS — M6281 Muscle weakness (generalized): Secondary | ICD-10-CM | POA: Diagnosis not present

## 2022-10-01 DIAGNOSIS — R279 Unspecified lack of coordination: Secondary | ICD-10-CM | POA: Diagnosis not present

## 2022-10-01 DIAGNOSIS — R471 Dysarthria and anarthria: Secondary | ICD-10-CM | POA: Diagnosis not present

## 2022-10-01 DIAGNOSIS — N3946 Mixed incontinence: Secondary | ICD-10-CM | POA: Diagnosis not present

## 2022-10-02 DIAGNOSIS — N3946 Mixed incontinence: Secondary | ICD-10-CM | POA: Diagnosis not present

## 2022-10-02 DIAGNOSIS — R4789 Other speech disturbances: Secondary | ICD-10-CM | POA: Diagnosis not present

## 2022-10-02 DIAGNOSIS — R471 Dysarthria and anarthria: Secondary | ICD-10-CM | POA: Diagnosis not present

## 2022-10-02 DIAGNOSIS — R279 Unspecified lack of coordination: Secondary | ICD-10-CM | POA: Diagnosis not present

## 2022-10-02 DIAGNOSIS — M6281 Muscle weakness (generalized): Secondary | ICD-10-CM | POA: Diagnosis not present

## 2022-10-02 DIAGNOSIS — R2689 Other abnormalities of gait and mobility: Secondary | ICD-10-CM | POA: Diagnosis not present

## 2022-10-05 DIAGNOSIS — R4789 Other speech disturbances: Secondary | ICD-10-CM | POA: Diagnosis not present

## 2022-10-05 DIAGNOSIS — R279 Unspecified lack of coordination: Secondary | ICD-10-CM | POA: Diagnosis not present

## 2022-10-05 DIAGNOSIS — M6281 Muscle weakness (generalized): Secondary | ICD-10-CM | POA: Diagnosis not present

## 2022-10-05 DIAGNOSIS — R471 Dysarthria and anarthria: Secondary | ICD-10-CM | POA: Diagnosis not present

## 2022-10-05 DIAGNOSIS — N3946 Mixed incontinence: Secondary | ICD-10-CM | POA: Diagnosis not present

## 2022-10-05 DIAGNOSIS — R2689 Other abnormalities of gait and mobility: Secondary | ICD-10-CM | POA: Diagnosis not present

## 2022-10-06 DIAGNOSIS — N3946 Mixed incontinence: Secondary | ICD-10-CM | POA: Diagnosis not present

## 2022-10-06 DIAGNOSIS — R4789 Other speech disturbances: Secondary | ICD-10-CM | POA: Diagnosis not present

## 2022-10-06 DIAGNOSIS — R471 Dysarthria and anarthria: Secondary | ICD-10-CM | POA: Diagnosis not present

## 2022-10-06 DIAGNOSIS — R2689 Other abnormalities of gait and mobility: Secondary | ICD-10-CM | POA: Diagnosis not present

## 2022-10-06 DIAGNOSIS — M6281 Muscle weakness (generalized): Secondary | ICD-10-CM | POA: Diagnosis not present

## 2022-10-06 DIAGNOSIS — R279 Unspecified lack of coordination: Secondary | ICD-10-CM | POA: Diagnosis not present

## 2022-10-07 DIAGNOSIS — R279 Unspecified lack of coordination: Secondary | ICD-10-CM | POA: Diagnosis not present

## 2022-10-07 DIAGNOSIS — M6281 Muscle weakness (generalized): Secondary | ICD-10-CM | POA: Diagnosis not present

## 2022-10-07 DIAGNOSIS — R471 Dysarthria and anarthria: Secondary | ICD-10-CM | POA: Diagnosis not present

## 2022-10-07 DIAGNOSIS — N3946 Mixed incontinence: Secondary | ICD-10-CM | POA: Diagnosis not present

## 2022-10-07 DIAGNOSIS — R2689 Other abnormalities of gait and mobility: Secondary | ICD-10-CM | POA: Diagnosis not present

## 2022-10-07 DIAGNOSIS — R4789 Other speech disturbances: Secondary | ICD-10-CM | POA: Diagnosis not present

## 2022-10-08 DIAGNOSIS — N3946 Mixed incontinence: Secondary | ICD-10-CM | POA: Diagnosis not present

## 2022-10-08 DIAGNOSIS — R2689 Other abnormalities of gait and mobility: Secondary | ICD-10-CM | POA: Diagnosis not present

## 2022-10-08 DIAGNOSIS — R279 Unspecified lack of coordination: Secondary | ICD-10-CM | POA: Diagnosis not present

## 2022-10-08 DIAGNOSIS — R471 Dysarthria and anarthria: Secondary | ICD-10-CM | POA: Diagnosis not present

## 2022-10-08 DIAGNOSIS — R4789 Other speech disturbances: Secondary | ICD-10-CM | POA: Diagnosis not present

## 2022-10-08 DIAGNOSIS — M6281 Muscle weakness (generalized): Secondary | ICD-10-CM | POA: Diagnosis not present

## 2022-10-09 DIAGNOSIS — R4789 Other speech disturbances: Secondary | ICD-10-CM | POA: Diagnosis not present

## 2022-10-09 DIAGNOSIS — R2689 Other abnormalities of gait and mobility: Secondary | ICD-10-CM | POA: Diagnosis not present

## 2022-10-09 DIAGNOSIS — R279 Unspecified lack of coordination: Secondary | ICD-10-CM | POA: Diagnosis not present

## 2022-10-09 DIAGNOSIS — R471 Dysarthria and anarthria: Secondary | ICD-10-CM | POA: Diagnosis not present

## 2022-10-09 DIAGNOSIS — M6281 Muscle weakness (generalized): Secondary | ICD-10-CM | POA: Diagnosis not present

## 2022-10-09 DIAGNOSIS — N3946 Mixed incontinence: Secondary | ICD-10-CM | POA: Diagnosis not present

## 2022-10-12 DIAGNOSIS — N3946 Mixed incontinence: Secondary | ICD-10-CM | POA: Diagnosis not present

## 2022-10-12 DIAGNOSIS — R4789 Other speech disturbances: Secondary | ICD-10-CM | POA: Diagnosis not present

## 2022-10-12 DIAGNOSIS — M6281 Muscle weakness (generalized): Secondary | ICD-10-CM | POA: Diagnosis not present

## 2022-10-12 DIAGNOSIS — R279 Unspecified lack of coordination: Secondary | ICD-10-CM | POA: Diagnosis not present

## 2022-10-12 DIAGNOSIS — R2689 Other abnormalities of gait and mobility: Secondary | ICD-10-CM | POA: Diagnosis not present

## 2022-10-12 DIAGNOSIS — R471 Dysarthria and anarthria: Secondary | ICD-10-CM | POA: Diagnosis not present

## 2022-10-13 DIAGNOSIS — N3946 Mixed incontinence: Secondary | ICD-10-CM | POA: Diagnosis not present

## 2022-10-13 DIAGNOSIS — M6281 Muscle weakness (generalized): Secondary | ICD-10-CM | POA: Diagnosis not present

## 2022-10-13 DIAGNOSIS — R2689 Other abnormalities of gait and mobility: Secondary | ICD-10-CM | POA: Diagnosis not present

## 2022-10-13 DIAGNOSIS — R4789 Other speech disturbances: Secondary | ICD-10-CM | POA: Diagnosis not present

## 2022-10-13 DIAGNOSIS — R471 Dysarthria and anarthria: Secondary | ICD-10-CM | POA: Diagnosis not present

## 2022-10-13 DIAGNOSIS — R279 Unspecified lack of coordination: Secondary | ICD-10-CM | POA: Diagnosis not present

## 2022-10-14 DIAGNOSIS — R4789 Other speech disturbances: Secondary | ICD-10-CM | POA: Diagnosis not present

## 2022-10-14 DIAGNOSIS — R471 Dysarthria and anarthria: Secondary | ICD-10-CM | POA: Diagnosis not present

## 2022-10-14 DIAGNOSIS — R2689 Other abnormalities of gait and mobility: Secondary | ICD-10-CM | POA: Diagnosis not present

## 2022-10-14 DIAGNOSIS — M6281 Muscle weakness (generalized): Secondary | ICD-10-CM | POA: Diagnosis not present

## 2022-10-14 DIAGNOSIS — N3946 Mixed incontinence: Secondary | ICD-10-CM | POA: Diagnosis not present

## 2022-10-14 DIAGNOSIS — R279 Unspecified lack of coordination: Secondary | ICD-10-CM | POA: Diagnosis not present

## 2022-10-15 DIAGNOSIS — N3946 Mixed incontinence: Secondary | ICD-10-CM | POA: Diagnosis not present

## 2022-10-15 DIAGNOSIS — R2689 Other abnormalities of gait and mobility: Secondary | ICD-10-CM | POA: Diagnosis not present

## 2022-10-15 DIAGNOSIS — R471 Dysarthria and anarthria: Secondary | ICD-10-CM | POA: Diagnosis not present

## 2022-10-15 DIAGNOSIS — R279 Unspecified lack of coordination: Secondary | ICD-10-CM | POA: Diagnosis not present

## 2022-10-15 DIAGNOSIS — M6281 Muscle weakness (generalized): Secondary | ICD-10-CM | POA: Diagnosis not present

## 2022-10-15 DIAGNOSIS — R4789 Other speech disturbances: Secondary | ICD-10-CM | POA: Diagnosis not present

## 2022-10-16 DIAGNOSIS — R279 Unspecified lack of coordination: Secondary | ICD-10-CM | POA: Diagnosis not present

## 2022-10-16 DIAGNOSIS — R4789 Other speech disturbances: Secondary | ICD-10-CM | POA: Diagnosis not present

## 2022-10-16 DIAGNOSIS — N3946 Mixed incontinence: Secondary | ICD-10-CM | POA: Diagnosis not present

## 2022-10-16 DIAGNOSIS — R471 Dysarthria and anarthria: Secondary | ICD-10-CM | POA: Diagnosis not present

## 2022-10-16 DIAGNOSIS — M6281 Muscle weakness (generalized): Secondary | ICD-10-CM | POA: Diagnosis not present

## 2022-10-16 DIAGNOSIS — R2689 Other abnormalities of gait and mobility: Secondary | ICD-10-CM | POA: Diagnosis not present

## 2022-10-19 DIAGNOSIS — N3946 Mixed incontinence: Secondary | ICD-10-CM | POA: Diagnosis not present

## 2022-10-19 DIAGNOSIS — R488 Other symbolic dysfunctions: Secondary | ICD-10-CM | POA: Diagnosis not present

## 2022-10-19 DIAGNOSIS — R2689 Other abnormalities of gait and mobility: Secondary | ICD-10-CM | POA: Diagnosis not present

## 2022-10-19 DIAGNOSIS — R471 Dysarthria and anarthria: Secondary | ICD-10-CM | POA: Diagnosis not present

## 2022-10-19 DIAGNOSIS — R4789 Other speech disturbances: Secondary | ICD-10-CM | POA: Diagnosis not present

## 2022-10-19 DIAGNOSIS — R279 Unspecified lack of coordination: Secondary | ICD-10-CM | POA: Diagnosis not present

## 2022-10-19 DIAGNOSIS — M6281 Muscle weakness (generalized): Secondary | ICD-10-CM | POA: Diagnosis not present

## 2022-10-20 DIAGNOSIS — R471 Dysarthria and anarthria: Secondary | ICD-10-CM | POA: Diagnosis not present

## 2022-10-20 DIAGNOSIS — M6281 Muscle weakness (generalized): Secondary | ICD-10-CM | POA: Diagnosis not present

## 2022-10-20 DIAGNOSIS — N3946 Mixed incontinence: Secondary | ICD-10-CM | POA: Diagnosis not present

## 2022-10-20 DIAGNOSIS — R4789 Other speech disturbances: Secondary | ICD-10-CM | POA: Diagnosis not present

## 2022-10-20 DIAGNOSIS — R279 Unspecified lack of coordination: Secondary | ICD-10-CM | POA: Diagnosis not present

## 2022-10-20 DIAGNOSIS — R2689 Other abnormalities of gait and mobility: Secondary | ICD-10-CM | POA: Diagnosis not present

## 2022-10-21 DIAGNOSIS — R471 Dysarthria and anarthria: Secondary | ICD-10-CM | POA: Diagnosis not present

## 2022-10-21 DIAGNOSIS — E1169 Type 2 diabetes mellitus with other specified complication: Secondary | ICD-10-CM | POA: Diagnosis not present

## 2022-10-21 DIAGNOSIS — F209 Schizophrenia, unspecified: Secondary | ICD-10-CM | POA: Diagnosis not present

## 2022-10-21 DIAGNOSIS — R2689 Other abnormalities of gait and mobility: Secondary | ICD-10-CM | POA: Diagnosis not present

## 2022-10-21 DIAGNOSIS — E785 Hyperlipidemia, unspecified: Secondary | ICD-10-CM | POA: Diagnosis not present

## 2022-10-21 DIAGNOSIS — E039 Hypothyroidism, unspecified: Secondary | ICD-10-CM | POA: Diagnosis not present

## 2022-10-21 DIAGNOSIS — R4789 Other speech disturbances: Secondary | ICD-10-CM | POA: Diagnosis not present

## 2022-10-21 DIAGNOSIS — I10 Essential (primary) hypertension: Secondary | ICD-10-CM | POA: Diagnosis not present

## 2022-10-21 DIAGNOSIS — N3946 Mixed incontinence: Secondary | ICD-10-CM | POA: Diagnosis not present

## 2022-10-21 DIAGNOSIS — R279 Unspecified lack of coordination: Secondary | ICD-10-CM | POA: Diagnosis not present

## 2022-10-21 DIAGNOSIS — M6281 Muscle weakness (generalized): Secondary | ICD-10-CM | POA: Diagnosis not present

## 2022-10-27 DIAGNOSIS — M6281 Muscle weakness (generalized): Secondary | ICD-10-CM | POA: Diagnosis not present

## 2022-10-27 DIAGNOSIS — R4789 Other speech disturbances: Secondary | ICD-10-CM | POA: Diagnosis not present

## 2022-10-27 DIAGNOSIS — R2689 Other abnormalities of gait and mobility: Secondary | ICD-10-CM | POA: Diagnosis not present

## 2022-10-27 DIAGNOSIS — R279 Unspecified lack of coordination: Secondary | ICD-10-CM | POA: Diagnosis not present

## 2022-10-27 DIAGNOSIS — R471 Dysarthria and anarthria: Secondary | ICD-10-CM | POA: Diagnosis not present

## 2022-10-27 DIAGNOSIS — N3946 Mixed incontinence: Secondary | ICD-10-CM | POA: Diagnosis not present

## 2022-10-28 DIAGNOSIS — R2689 Other abnormalities of gait and mobility: Secondary | ICD-10-CM | POA: Diagnosis not present

## 2022-10-28 DIAGNOSIS — R279 Unspecified lack of coordination: Secondary | ICD-10-CM | POA: Diagnosis not present

## 2022-10-28 DIAGNOSIS — R471 Dysarthria and anarthria: Secondary | ICD-10-CM | POA: Diagnosis not present

## 2022-10-28 DIAGNOSIS — N3946 Mixed incontinence: Secondary | ICD-10-CM | POA: Diagnosis not present

## 2022-10-28 DIAGNOSIS — R4789 Other speech disturbances: Secondary | ICD-10-CM | POA: Diagnosis not present

## 2022-10-28 DIAGNOSIS — M6281 Muscle weakness (generalized): Secondary | ICD-10-CM | POA: Diagnosis not present

## 2022-10-29 ENCOUNTER — Other Ambulatory Visit: Payer: Medicare Other

## 2022-10-29 DIAGNOSIS — R4789 Other speech disturbances: Secondary | ICD-10-CM | POA: Diagnosis not present

## 2022-10-29 DIAGNOSIS — R279 Unspecified lack of coordination: Secondary | ICD-10-CM | POA: Diagnosis not present

## 2022-10-29 DIAGNOSIS — R471 Dysarthria and anarthria: Secondary | ICD-10-CM | POA: Diagnosis not present

## 2022-10-29 DIAGNOSIS — R2689 Other abnormalities of gait and mobility: Secondary | ICD-10-CM | POA: Diagnosis not present

## 2022-10-29 DIAGNOSIS — N3946 Mixed incontinence: Secondary | ICD-10-CM | POA: Diagnosis not present

## 2022-10-29 DIAGNOSIS — M6281 Muscle weakness (generalized): Secondary | ICD-10-CM | POA: Diagnosis not present

## 2022-10-30 DIAGNOSIS — M6281 Muscle weakness (generalized): Secondary | ICD-10-CM | POA: Diagnosis not present

## 2022-10-30 DIAGNOSIS — N3946 Mixed incontinence: Secondary | ICD-10-CM | POA: Diagnosis not present

## 2022-10-30 DIAGNOSIS — R2689 Other abnormalities of gait and mobility: Secondary | ICD-10-CM | POA: Diagnosis not present

## 2022-10-30 DIAGNOSIS — R279 Unspecified lack of coordination: Secondary | ICD-10-CM | POA: Diagnosis not present

## 2022-10-30 DIAGNOSIS — R4789 Other speech disturbances: Secondary | ICD-10-CM | POA: Diagnosis not present

## 2022-10-30 DIAGNOSIS — R471 Dysarthria and anarthria: Secondary | ICD-10-CM | POA: Diagnosis not present

## 2022-11-01 DIAGNOSIS — R471 Dysarthria and anarthria: Secondary | ICD-10-CM | POA: Diagnosis not present

## 2022-11-01 DIAGNOSIS — N3946 Mixed incontinence: Secondary | ICD-10-CM | POA: Diagnosis not present

## 2022-11-01 DIAGNOSIS — R4789 Other speech disturbances: Secondary | ICD-10-CM | POA: Diagnosis not present

## 2022-11-01 DIAGNOSIS — R279 Unspecified lack of coordination: Secondary | ICD-10-CM | POA: Diagnosis not present

## 2022-11-01 DIAGNOSIS — R2689 Other abnormalities of gait and mobility: Secondary | ICD-10-CM | POA: Diagnosis not present

## 2022-11-01 DIAGNOSIS — M6281 Muscle weakness (generalized): Secondary | ICD-10-CM | POA: Diagnosis not present

## 2022-11-02 DIAGNOSIS — R2689 Other abnormalities of gait and mobility: Secondary | ICD-10-CM | POA: Diagnosis not present

## 2022-11-02 DIAGNOSIS — M6281 Muscle weakness (generalized): Secondary | ICD-10-CM | POA: Diagnosis not present

## 2022-11-02 DIAGNOSIS — R471 Dysarthria and anarthria: Secondary | ICD-10-CM | POA: Diagnosis not present

## 2022-11-02 DIAGNOSIS — R279 Unspecified lack of coordination: Secondary | ICD-10-CM | POA: Diagnosis not present

## 2022-11-02 DIAGNOSIS — N3946 Mixed incontinence: Secondary | ICD-10-CM | POA: Diagnosis not present

## 2022-11-02 DIAGNOSIS — R4789 Other speech disturbances: Secondary | ICD-10-CM | POA: Diagnosis not present

## 2022-11-04 DIAGNOSIS — R279 Unspecified lack of coordination: Secondary | ICD-10-CM | POA: Diagnosis not present

## 2022-11-04 DIAGNOSIS — R471 Dysarthria and anarthria: Secondary | ICD-10-CM | POA: Diagnosis not present

## 2022-11-04 DIAGNOSIS — N3946 Mixed incontinence: Secondary | ICD-10-CM | POA: Diagnosis not present

## 2022-11-04 DIAGNOSIS — R4789 Other speech disturbances: Secondary | ICD-10-CM | POA: Diagnosis not present

## 2022-11-04 DIAGNOSIS — M6281 Muscle weakness (generalized): Secondary | ICD-10-CM | POA: Diagnosis not present

## 2022-11-04 DIAGNOSIS — R2689 Other abnormalities of gait and mobility: Secondary | ICD-10-CM | POA: Diagnosis not present

## 2022-11-05 DIAGNOSIS — B351 Tinea unguium: Secondary | ICD-10-CM | POA: Diagnosis not present

## 2022-11-05 DIAGNOSIS — R471 Dysarthria and anarthria: Secondary | ICD-10-CM | POA: Diagnosis not present

## 2022-11-05 DIAGNOSIS — E1140 Type 2 diabetes mellitus with diabetic neuropathy, unspecified: Secondary | ICD-10-CM | POA: Diagnosis not present

## 2022-11-05 DIAGNOSIS — M6281 Muscle weakness (generalized): Secondary | ICD-10-CM | POA: Diagnosis not present

## 2022-11-05 DIAGNOSIS — R600 Localized edema: Secondary | ICD-10-CM | POA: Diagnosis not present

## 2022-11-05 DIAGNOSIS — R279 Unspecified lack of coordination: Secondary | ICD-10-CM | POA: Diagnosis not present

## 2022-11-05 DIAGNOSIS — L600 Ingrowing nail: Secondary | ICD-10-CM | POA: Diagnosis not present

## 2022-11-05 DIAGNOSIS — R4789 Other speech disturbances: Secondary | ICD-10-CM | POA: Diagnosis not present

## 2022-11-05 DIAGNOSIS — R234 Changes in skin texture: Secondary | ICD-10-CM | POA: Diagnosis not present

## 2022-11-05 DIAGNOSIS — M79672 Pain in left foot: Secondary | ICD-10-CM | POA: Diagnosis not present

## 2022-11-05 DIAGNOSIS — M79671 Pain in right foot: Secondary | ICD-10-CM | POA: Diagnosis not present

## 2022-11-05 DIAGNOSIS — R2689 Other abnormalities of gait and mobility: Secondary | ICD-10-CM | POA: Diagnosis not present

## 2022-11-05 DIAGNOSIS — N3946 Mixed incontinence: Secondary | ICD-10-CM | POA: Diagnosis not present

## 2022-11-06 DIAGNOSIS — R471 Dysarthria and anarthria: Secondary | ICD-10-CM | POA: Diagnosis not present

## 2022-11-06 DIAGNOSIS — M6281 Muscle weakness (generalized): Secondary | ICD-10-CM | POA: Diagnosis not present

## 2022-11-06 DIAGNOSIS — N3946 Mixed incontinence: Secondary | ICD-10-CM | POA: Diagnosis not present

## 2022-11-06 DIAGNOSIS — R4789 Other speech disturbances: Secondary | ICD-10-CM | POA: Diagnosis not present

## 2022-11-06 DIAGNOSIS — R2689 Other abnormalities of gait and mobility: Secondary | ICD-10-CM | POA: Diagnosis not present

## 2022-11-06 DIAGNOSIS — R279 Unspecified lack of coordination: Secondary | ICD-10-CM | POA: Diagnosis not present

## 2022-11-09 DIAGNOSIS — M6281 Muscle weakness (generalized): Secondary | ICD-10-CM | POA: Diagnosis not present

## 2022-11-09 DIAGNOSIS — N3946 Mixed incontinence: Secondary | ICD-10-CM | POA: Diagnosis not present

## 2022-11-09 DIAGNOSIS — R471 Dysarthria and anarthria: Secondary | ICD-10-CM | POA: Diagnosis not present

## 2022-11-09 DIAGNOSIS — R2689 Other abnormalities of gait and mobility: Secondary | ICD-10-CM | POA: Diagnosis not present

## 2022-11-09 DIAGNOSIS — R4789 Other speech disturbances: Secondary | ICD-10-CM | POA: Diagnosis not present

## 2022-11-09 DIAGNOSIS — R279 Unspecified lack of coordination: Secondary | ICD-10-CM | POA: Diagnosis not present

## 2022-11-10 DIAGNOSIS — R279 Unspecified lack of coordination: Secondary | ICD-10-CM | POA: Diagnosis not present

## 2022-11-10 DIAGNOSIS — R4789 Other speech disturbances: Secondary | ICD-10-CM | POA: Diagnosis not present

## 2022-11-10 DIAGNOSIS — N3946 Mixed incontinence: Secondary | ICD-10-CM | POA: Diagnosis not present

## 2022-11-10 DIAGNOSIS — R2689 Other abnormalities of gait and mobility: Secondary | ICD-10-CM | POA: Diagnosis not present

## 2022-11-10 DIAGNOSIS — R471 Dysarthria and anarthria: Secondary | ICD-10-CM | POA: Diagnosis not present

## 2022-11-10 DIAGNOSIS — M6281 Muscle weakness (generalized): Secondary | ICD-10-CM | POA: Diagnosis not present

## 2022-11-11 DIAGNOSIS — N3946 Mixed incontinence: Secondary | ICD-10-CM | POA: Diagnosis not present

## 2022-11-11 DIAGNOSIS — R279 Unspecified lack of coordination: Secondary | ICD-10-CM | POA: Diagnosis not present

## 2022-11-11 DIAGNOSIS — M6281 Muscle weakness (generalized): Secondary | ICD-10-CM | POA: Diagnosis not present

## 2022-11-11 DIAGNOSIS — R471 Dysarthria and anarthria: Secondary | ICD-10-CM | POA: Diagnosis not present

## 2022-11-11 DIAGNOSIS — R4789 Other speech disturbances: Secondary | ICD-10-CM | POA: Diagnosis not present

## 2022-11-11 DIAGNOSIS — R2689 Other abnormalities of gait and mobility: Secondary | ICD-10-CM | POA: Diagnosis not present

## 2022-11-13 DIAGNOSIS — R471 Dysarthria and anarthria: Secondary | ICD-10-CM | POA: Diagnosis not present

## 2022-11-13 DIAGNOSIS — M6281 Muscle weakness (generalized): Secondary | ICD-10-CM | POA: Diagnosis not present

## 2022-11-13 DIAGNOSIS — R4789 Other speech disturbances: Secondary | ICD-10-CM | POA: Diagnosis not present

## 2022-11-13 DIAGNOSIS — N3946 Mixed incontinence: Secondary | ICD-10-CM | POA: Diagnosis not present

## 2022-11-13 DIAGNOSIS — R2689 Other abnormalities of gait and mobility: Secondary | ICD-10-CM | POA: Diagnosis not present

## 2022-11-13 DIAGNOSIS — R279 Unspecified lack of coordination: Secondary | ICD-10-CM | POA: Diagnosis not present

## 2022-11-16 DIAGNOSIS — M6281 Muscle weakness (generalized): Secondary | ICD-10-CM | POA: Diagnosis not present

## 2022-11-16 DIAGNOSIS — N3946 Mixed incontinence: Secondary | ICD-10-CM | POA: Diagnosis not present

## 2022-11-16 DIAGNOSIS — R471 Dysarthria and anarthria: Secondary | ICD-10-CM | POA: Diagnosis not present

## 2022-11-16 DIAGNOSIS — R2689 Other abnormalities of gait and mobility: Secondary | ICD-10-CM | POA: Diagnosis not present

## 2022-11-16 DIAGNOSIS — R4789 Other speech disturbances: Secondary | ICD-10-CM | POA: Diagnosis not present

## 2022-11-16 DIAGNOSIS — R279 Unspecified lack of coordination: Secondary | ICD-10-CM | POA: Diagnosis not present

## 2022-11-18 ENCOUNTER — Other Ambulatory Visit: Payer: Medicare Other

## 2022-11-18 DIAGNOSIS — Z1231 Encounter for screening mammogram for malignant neoplasm of breast: Secondary | ICD-10-CM | POA: Diagnosis not present

## 2022-11-19 DIAGNOSIS — R471 Dysarthria and anarthria: Secondary | ICD-10-CM | POA: Diagnosis not present

## 2022-11-19 DIAGNOSIS — R488 Other symbolic dysfunctions: Secondary | ICD-10-CM | POA: Diagnosis not present

## 2022-11-19 DIAGNOSIS — R4789 Other speech disturbances: Secondary | ICD-10-CM | POA: Diagnosis not present

## 2022-11-23 DIAGNOSIS — R471 Dysarthria and anarthria: Secondary | ICD-10-CM | POA: Diagnosis not present

## 2022-11-23 DIAGNOSIS — R488 Other symbolic dysfunctions: Secondary | ICD-10-CM | POA: Diagnosis not present

## 2022-11-23 DIAGNOSIS — R4789 Other speech disturbances: Secondary | ICD-10-CM | POA: Diagnosis not present

## 2022-11-25 ENCOUNTER — Other Ambulatory Visit: Payer: Medicare Other

## 2022-11-26 DIAGNOSIS — R471 Dysarthria and anarthria: Secondary | ICD-10-CM | POA: Diagnosis not present

## 2022-11-26 DIAGNOSIS — R4789 Other speech disturbances: Secondary | ICD-10-CM | POA: Diagnosis not present

## 2022-11-26 DIAGNOSIS — R488 Other symbolic dysfunctions: Secondary | ICD-10-CM | POA: Diagnosis not present

## 2022-11-30 DIAGNOSIS — R488 Other symbolic dysfunctions: Secondary | ICD-10-CM | POA: Diagnosis not present

## 2022-11-30 DIAGNOSIS — R4789 Other speech disturbances: Secondary | ICD-10-CM | POA: Diagnosis not present

## 2022-11-30 DIAGNOSIS — R471 Dysarthria and anarthria: Secondary | ICD-10-CM | POA: Diagnosis not present

## 2022-12-01 ENCOUNTER — Inpatient Hospital Stay: Admission: RE | Admit: 2022-12-01 | Payer: Medicare Other | Source: Ambulatory Visit

## 2022-12-04 DIAGNOSIS — E039 Hypothyroidism, unspecified: Secondary | ICD-10-CM | POA: Diagnosis not present

## 2022-12-08 DIAGNOSIS — H26491 Other secondary cataract, right eye: Secondary | ICD-10-CM | POA: Diagnosis not present

## 2022-12-08 DIAGNOSIS — H2512 Age-related nuclear cataract, left eye: Secondary | ICD-10-CM | POA: Diagnosis not present

## 2022-12-08 DIAGNOSIS — H401131 Primary open-angle glaucoma, bilateral, mild stage: Secondary | ICD-10-CM | POA: Diagnosis not present

## 2022-12-23 DIAGNOSIS — F200 Paranoid schizophrenia: Secondary | ICD-10-CM | POA: Diagnosis not present

## 2022-12-23 DIAGNOSIS — G3184 Mild cognitive impairment, so stated: Secondary | ICD-10-CM | POA: Diagnosis not present

## 2022-12-25 ENCOUNTER — Other Ambulatory Visit: Payer: Self-pay | Admitting: Family Medicine

## 2022-12-25 DIAGNOSIS — D320 Benign neoplasm of cerebral meninges: Secondary | ICD-10-CM

## 2023-01-13 DIAGNOSIS — Z23 Encounter for immunization: Secondary | ICD-10-CM | POA: Diagnosis not present

## 2023-01-13 DIAGNOSIS — Z0000 Encounter for general adult medical examination without abnormal findings: Secondary | ICD-10-CM | POA: Diagnosis not present

## 2023-01-13 DIAGNOSIS — F209 Schizophrenia, unspecified: Secondary | ICD-10-CM | POA: Diagnosis not present

## 2023-01-13 DIAGNOSIS — E039 Hypothyroidism, unspecified: Secondary | ICD-10-CM | POA: Diagnosis not present

## 2023-01-13 DIAGNOSIS — I10 Essential (primary) hypertension: Secondary | ICD-10-CM | POA: Diagnosis not present

## 2023-01-13 DIAGNOSIS — D329 Benign neoplasm of meninges, unspecified: Secondary | ICD-10-CM | POA: Diagnosis not present

## 2023-01-13 DIAGNOSIS — E785 Hyperlipidemia, unspecified: Secondary | ICD-10-CM | POA: Diagnosis not present

## 2023-01-13 DIAGNOSIS — M8588 Other specified disorders of bone density and structure, other site: Secondary | ICD-10-CM | POA: Diagnosis not present

## 2023-01-13 DIAGNOSIS — R911 Solitary pulmonary nodule: Secondary | ICD-10-CM | POA: Diagnosis not present

## 2023-01-13 DIAGNOSIS — E1169 Type 2 diabetes mellitus with other specified complication: Secondary | ICD-10-CM | POA: Diagnosis not present

## 2023-01-17 ENCOUNTER — Inpatient Hospital Stay: Admission: RE | Admit: 2023-01-17 | Payer: Medicare Other | Source: Ambulatory Visit

## 2023-01-17 ENCOUNTER — Other Ambulatory Visit: Payer: Medicare Other

## 2023-01-19 ENCOUNTER — Inpatient Hospital Stay
Admission: RE | Admit: 2023-01-19 | Discharge: 2023-01-19 | Discharge status: Home / Self Care | Payer: Medicare Other | Source: Ambulatory Visit | Attending: Family Medicine | Admitting: Family Medicine

## 2023-01-19 DIAGNOSIS — D320 Benign neoplasm of cerebral meninges: Secondary | ICD-10-CM

## 2023-01-22 ENCOUNTER — Other Ambulatory Visit: Payer: Medicare Other

## 2023-01-29 ENCOUNTER — Ambulatory Visit
Admission: RE | Admit: 2023-01-29 | Discharge: 2023-01-29 | Disposition: A | Discharge status: Home / Self Care | Payer: Medicare Other | Source: Ambulatory Visit | Attending: Family Medicine | Admitting: Family Medicine

## 2023-01-29 DIAGNOSIS — I2510 Atherosclerotic heart disease of native coronary artery without angina pectoris: Secondary | ICD-10-CM | POA: Diagnosis not present

## 2023-01-29 DIAGNOSIS — R911 Solitary pulmonary nodule: Secondary | ICD-10-CM | POA: Diagnosis not present

## 2023-01-29 DIAGNOSIS — Z87891 Personal history of nicotine dependence: Secondary | ICD-10-CM | POA: Diagnosis not present

## 2023-01-29 DIAGNOSIS — I700 Atherosclerosis of aorta: Secondary | ICD-10-CM | POA: Diagnosis not present

## 2023-01-29 DIAGNOSIS — Z853 Personal history of malignant neoplasm of breast: Secondary | ICD-10-CM | POA: Diagnosis not present

## 2023-01-29 DIAGNOSIS — D3502 Benign neoplasm of left adrenal gland: Secondary | ICD-10-CM | POA: Diagnosis not present

## 2023-01-29 DIAGNOSIS — R9389 Abnormal findings on diagnostic imaging of other specified body structures: Secondary | ICD-10-CM

## 2023-01-29 DIAGNOSIS — D3501 Benign neoplasm of right adrenal gland: Secondary | ICD-10-CM | POA: Diagnosis not present

## 2023-02-02 ENCOUNTER — Other Ambulatory Visit: Payer: Medicare Other

## 2023-02-02 ENCOUNTER — Ambulatory Visit
Admission: RE | Admit: 2023-02-02 | Discharge: 2023-02-02 | Disposition: A | Discharge status: Home / Self Care | Payer: Medicare Other | Source: Ambulatory Visit | Attending: Family Medicine | Admitting: Family Medicine

## 2023-02-04 ENCOUNTER — Other Ambulatory Visit: Payer: Medicare Other

## 2023-02-11 ENCOUNTER — Other Ambulatory Visit: Payer: Medicare Other

## 2023-02-11 DIAGNOSIS — Z23 Encounter for immunization: Secondary | ICD-10-CM | POA: Diagnosis not present

## 2023-02-22 DIAGNOSIS — F200 Paranoid schizophrenia: Secondary | ICD-10-CM | POA: Diagnosis not present

## 2023-03-09 DIAGNOSIS — E039 Hypothyroidism, unspecified: Secondary | ICD-10-CM | POA: Diagnosis not present

## 2023-03-10 ENCOUNTER — Other Ambulatory Visit: Payer: Self-pay

## 2023-03-10 ENCOUNTER — Inpatient Hospital Stay: Payer: Medicare Other | Attending: Internal Medicine | Admitting: Internal Medicine

## 2023-03-10 ENCOUNTER — Inpatient Hospital Stay: Payer: Medicare Other

## 2023-03-10 ENCOUNTER — Telehealth: Payer: Self-pay | Admitting: Pulmonary Disease

## 2023-03-10 VITALS — BP 162/71 | HR 79 | Temp 97.5°F | Resp 14 | Ht 67.0 in | Wt 137.3 lb

## 2023-03-10 DIAGNOSIS — E119 Type 2 diabetes mellitus without complications: Secondary | ICD-10-CM | POA: Insufficient documentation

## 2023-03-10 DIAGNOSIS — I10 Essential (primary) hypertension: Secondary | ICD-10-CM | POA: Insufficient documentation

## 2023-03-10 DIAGNOSIS — F209 Schizophrenia, unspecified: Secondary | ICD-10-CM | POA: Diagnosis not present

## 2023-03-10 DIAGNOSIS — R911 Solitary pulmonary nodule: Secondary | ICD-10-CM | POA: Diagnosis not present

## 2023-03-10 DIAGNOSIS — D320 Benign neoplasm of cerebral meninges: Secondary | ICD-10-CM | POA: Diagnosis not present

## 2023-03-10 DIAGNOSIS — E7800 Pure hypercholesterolemia, unspecified: Secondary | ICD-10-CM | POA: Insufficient documentation

## 2023-03-10 DIAGNOSIS — Z853 Personal history of malignant neoplasm of breast: Secondary | ICD-10-CM | POA: Insufficient documentation

## 2023-03-10 LAB — CBC WITH DIFFERENTIAL (CANCER CENTER ONLY)
Abs Immature Granulocytes: 0.02 10*3/uL (ref 0.00–0.07)
Basophils Absolute: 0 10*3/uL (ref 0.0–0.1)
Basophils Relative: 0 %
Eosinophils Absolute: 0.1 10*3/uL (ref 0.0–0.5)
Eosinophils Relative: 1 %
HCT: 41.3 % (ref 36.0–46.0)
Hemoglobin: 13.7 g/dL (ref 12.0–15.0)
Immature Granulocytes: 0 %
Lymphocytes Relative: 25 %
Lymphs Abs: 2.1 10*3/uL (ref 0.7–4.0)
MCH: 28.5 pg (ref 26.0–34.0)
MCHC: 33.2 g/dL (ref 30.0–36.0)
MCV: 85.9 fL (ref 80.0–100.0)
Monocytes Absolute: 0.6 10*3/uL (ref 0.1–1.0)
Monocytes Relative: 7 %
Neutro Abs: 5.5 10*3/uL (ref 1.7–7.7)
Neutrophils Relative %: 67 %
Platelet Count: 251 10*3/uL (ref 150–400)
RBC: 4.81 MIL/uL (ref 3.87–5.11)
RDW: 13 % (ref 11.5–15.5)
WBC Count: 8.3 10*3/uL (ref 4.0–10.5)
nRBC: 0 % (ref 0.0–0.2)

## 2023-03-10 LAB — CMP (CANCER CENTER ONLY)
ALT: 10 U/L (ref 0–44)
AST: 14 U/L — ABNORMAL LOW (ref 15–41)
Albumin: 4.1 g/dL (ref 3.5–5.0)
Alkaline Phosphatase: 61 U/L (ref 38–126)
Anion gap: 5 (ref 5–15)
BUN: 24 mg/dL — ABNORMAL HIGH (ref 8–23)
CO2: 30 mmol/L (ref 22–32)
Calcium: 10.1 mg/dL (ref 8.9–10.3)
Chloride: 102 mmol/L (ref 98–111)
Creatinine: 0.82 mg/dL (ref 0.44–1.00)
GFR, Estimated: >60 mL/min (ref >60)
Glucose, Bld: 144 mg/dL — ABNORMAL HIGH (ref 70–99)
Potassium: 4.7 mmol/L (ref 3.5–5.1)
Sodium: 137 mmol/L (ref 135–145)
Total Bilirubin: 0.4 mg/dL (ref <1.2)
Total Protein: 7.3 g/dL (ref 6.5–8.1)

## 2023-03-10 NOTE — Telephone Encounter (Signed)
Please advise which day is best for virtual visit with you. Thanks!

## 2023-03-10 NOTE — Progress Notes (Signed)
[facility] [phone]   Fax:([phone]) [phone]  CONSULT NOTE  REFERRING PHYSICIAN: Dr. Laurann Montana  REASON FOR CONSULTATION:  77 years old white female with suspicious lung nodule  HPI Alexandra Lamb is a 77 y.o. female presented to the clinic today for initial evaluation accompanied by her sister.Discussed the use of AI scribe software for clinical note transcription with the patient, who gave verbal consent to proceed.  History of Present Illness   The patient, a 77 year old with a history of left breast cancer (treated with mastectomy in 2011), hypertension, diabetes, hypercholesterolemia, and schizophrenia, presents for evaluation of a suspicious spot in the right lung. The patient was unaware of this finding prior to the consultation. The spot was initially identified during a routine physical examination when a CT angiogram of the chest in March 2024 revealed a possible cystic nodule. A subsequent CT scan on 02/21/2023 confirmed the presence of a partially solid cystic nodule in the anterior apical part of the right upper lobe, which had increased in size from 1.2 cm to 1.5 cm over several months.  The patient denies any chest pain, shortness of breath, or cough. However, she reports a recent episode of diarrhea and a significant weight loss of approximately 20 pounds over an unspecified period. The patient also mentions a recent CT and MRI of the brain that showed suspicious meningiomas.  The patient has a significant smoking history, having smoked heavily for a long period, starting in college and quitting approximately six to seven years ago. The patient denies any alcohol or drug abuse. The family history is notable for a father who died of a heart attack and a mother who died of lung problems related to COPD. The patient is a widow, has no children, and previously worked in IT consultant.   RADIOLOGY CT Chest without contrast: Partially solid cystic  nodule in the anterior apical part of the right upper lobe, enlarged in size (02/21/2023) CT Angiogram of the chest: Partially solid cystic nodule measuring 1.2 cm (06/20/2022) CT Brain: Suspicious meningiomas MRI Brain: Suspicious meningiomas      HPI  Past Medical History:  Diagnosis Date   Cancer (HCC)    left breast   Diabetes mellitus    Hyperlipidemia    Hypertension    MCI (mild cognitive impairment)    Schizophrenia (HCC)    paranoid    Past Surgical History:  Procedure Laterality Date   BREAST SURGERY  2011   left breast mast   MASTECTOMY     left   TOOTH EXTRACTION      Family History  Problem Relation Age of Onset   Cancer Maternal Aunt        brain tumor   Dementia Neg Hx     Social History Social History   Tobacco Use   Smoking status: Former    Current packs/day: 0.00    Types: Cigarettes    Quit date: 04/21/2007    Years since quitting: 15.8   Smokeless tobacco: Never  Vaping Use   Vaping status: Never Used  Substance Use Topics   Alcohol use: No   Drug use: No    No Known Allergies  Current Outpatient Medications  Medication Sig Dispense Refill   acetaminophen (TYLENOL) 500 MG tablet Take 500 mg by mouth every 6 (six) hours as needed.       amLODipine (NORVASC) 2.5 MG tablet Take 2.5 mg by mouth daily.     amoxicillin (AMOXIL) 500  MG capsule Take 1 capsule (500 mg total) by mouth 3 (three) times daily for 4 days 12 capsule 0   atenolol (TENORMIN) 100 MG tablet Take 100 mg by mouth daily.     Cholecalciferol (RA VITAMIN D-3) 2000 units CAPS Take 1 capsule (2,000 Units total) by mouth daily. 90 capsule 3   divalproex (DEPAKOTE ER) 500 MG 24 hr tablet 1,500 mg at bedtime.     Dulaglutide (TRULICITY) 4.5 MG/0.5ML SOPN Inject 0.5 mLs into the skin every 7 (seven) days.     latanoprost (XALATAN) 0.005 % ophthalmic solution Place 1 drop into both eyes at bedtime.     levothyroxine (SYNTHROID) 112 MCG tablet Take 112 mcg by mouth daily before  breakfast.     memantine (NAMENDA) 10 MG tablet Take 10 mg by mouth 2 (two) times daily.     OLANZapine (ZYPREXA) 15 MG tablet Take 15 mg by mouth at bedtime.     simvastatin (ZOCOR) 20 MG tablet Take 20 mg by mouth daily.     valsartan-hydrochlorothiazide (DIOVAN-HCT) 320-12.5 MG tablet Take 1 tablet by mouth daily.     No current facility-administered medications for this visit.    Review of Systems  Constitutional: positive for fatigue and weight loss Eyes: negative Ears, nose, mouth, throat, and face: negative Respiratory: negative Cardiovascular: negative Gastrointestinal: negative Genitourinary:negative Integument/breast: negative Hematologic/lymphatic: negative Musculoskeletal:negative Neurological: negative Behavioral/Psych: negative Endocrine: negative Allergic/Immunologic: negative  Physical Exam  ZOX:WRUEA, healthy, no distress, well nourished, and well developed SKIN: skin color, texture, turgor are normal, no rashes or significant lesions HEAD: Normocephalic, No masses, lesions, tenderness or abnormalities EYES: normal, PERRLA, Conjunctiva are pink and non-injected EARS: External ears normal, Canals clear OROPHARYNX:no exudate, no erythema, and lips, buccal mucosa, and tongue normal  NECK: supple, no adenopathy, no JVD LYMPH:  no palpable lymphadenopathy, no hepatosplenomegaly BREAST:not examined LUNGS: clear to auscultation , and palpation HEART: regular rate & rhythm, no murmurs, and no gallops ABDOMEN:abdomen soft, non-tender, normal bowel sounds, and no masses or organomegaly BACK: Back symmetric, no curvature., No CVA tenderness EXTREMITIES:no joint deformities, effusion, or inflammation, no edema  NEURO: alert & oriented x 3 with fluent speech, no focal motor/sensory deficits  PERFORMANCE STATUS: ECOG 1  LABORATORY DATA: Lab Results  Component Value Date   WBC 8.3 03/10/2023   HGB 13.7 03/10/2023   HCT 41.3 03/10/2023   MCV 85.9 03/10/2023   PLT  251 03/10/2023      Chemistry      Component Value Date/Time   NA 137 03/10/2023 1311   NA 139 06/19/2014 1041   K 4.7 03/10/2023 1311   K 4.6 06/19/2014 1041   CL 102 03/10/2023 1311   CO2 30 03/10/2023 1311   CO2 25 06/19/2014 1041   BUN 24 (H) 03/10/2023 1311   BUN 23.5 06/19/2014 1041   CREATININE 0.82 03/10/2023 1311   CREATININE 0.9 06/19/2014 1041      Component Value Date/Time   CALCIUM 10.1 03/10/2023 1311   CALCIUM 9.6 06/19/2014 1041   ALKPHOS 61 03/10/2023 1311   ALKPHOS 56 06/19/2014 1041   AST 14 (L) 03/10/2023 1311   AST 24 06/19/2014 1041   ALT 10 03/10/2023 1311   ALT 12 06/19/2014 1041   BILITOT 0.4 03/10/2023 1311   BILITOT 0.39 06/19/2014 1041       RADIOGRAPHIC STUDIES: No results found.  ASSESSMENT AND PLAN: Assessment and Plan    Suspicious Lung Nodule Suspicious lung nodule in the anterior apical part of the right  upper lobe, increased in size from 1.2 cm to 1.5 cm, raising suspicion for NSCLC. Differential diagnosis includes inflammation. Likely early stage (stage 1A) if confirmed as lung cancer. Discussed treatment options: surgery (preferred if pulmonary function tests indicate suitability) or radiation (if surgery is not feasible). 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 Further evaluation required to determine nature and impact. - Monitor with follow-up imaging as needed  Breast Cancer Left breast cancer treated with mastectomy in 2011. No hormonal therapy post-surgery. Currently, no active issues. - Continue routine follow-up as per oncology guidelines  Hypertension Chronic condition managed with medication. No acute issues. - Continue current  antihypertensive therapy - Monitor blood pressure regularly  Diabetes Mellitus Chronic condition managed with medication. No acute issues. - Continue current diabetes management plan - Monitor blood glucose levels regularly  Hyperlipidemia Chronic condition managed with medication. No acute issues. - Continue current lipid-lowering therapy - Monitor lipid levels regularly  Schizophrenia Chronic condition managed with medication. No acute issues. - Continue current antipsychotic therapy - Monitor mental health regularly  General Health Maintenance Discussed importance of routine screenings and lifestyle modifications. - Encourage routine physical exams - Promote a healthy lifestyle including diet and exercise  Follow-up - Schedule follow-up appointment in three weeks to discuss PET scan and biopsy results - Coordinate with pulmonology for biopsy appointment - Ensure timely communication of PET scan results.   The patient was advised to call immediately if she has any other concerning symptoms in the interval. The patient voices understanding of current disease status and treatment options and is in agreement with the current care plan.  All questions were answered. The patient knows to call the clinic with any problems, questions or concerns. We can certainly see the patient much sooner if necessary.  Thank you so much for allowing me to participate in the care of Alexandra Lamb. I will continue to follow up the patient with you and assist in her care. The total time spent in the appointment was 60 minutes.  Disclaimer: This note was dictated with voice recognition software. Similar sounding words can inadvertently be transcribed and may not be corrected upon review.   Lajuana Matte March 10, 2023, 2:20 PM

## 2023-03-10 NOTE — Progress Notes (Signed)
I met the pt face to face today at her Med Onc consult with Dr.Mohamed. Pt was accompanied by her sister, [name]. The plan for the pt is an evaluation by pulmonology and a PET scan. Pt currently lives in an assisted living facility and enjoys it very much. Her sister, [name], providers her transportation. I provided my card to [facility] and the pt. All questions answered.

## 2023-03-12 ENCOUNTER — Encounter (HOSPITAL_COMMUNITY)
Admission: RE | Admit: 2023-03-12 | Discharge: 2023-03-12 | Disposition: A | Discharge status: Home / Self Care | Payer: Medicare Other | Source: Ambulatory Visit | Attending: Internal Medicine | Admitting: Internal Medicine

## 2023-03-12 DIAGNOSIS — R911 Solitary pulmonary nodule: Secondary | ICD-10-CM | POA: Diagnosis not present

## 2023-03-12 DIAGNOSIS — R918 Other nonspecific abnormal finding of lung field: Secondary | ICD-10-CM | POA: Diagnosis not present

## 2023-03-12 LAB — GLUCOSE, CAPILLARY: Glucose-Capillary: 183 mg/dL — ABNORMAL HIGH (ref 70–99)

## 2023-03-12 MED ORDER — FLUDEOXYGLUCOSE F - 18 (FDG) INJECTION
6.5000 | Freq: Once | INTRAVENOUS | Status: AC | PRN
  Administered 2023-03-12: 6.85 via INTRAVENOUS

## 2023-03-12 NOTE — Telephone Encounter (Signed)
Called pt's sister (legal guardian) and scheduled video visit on 12/3 at 1:30pm. [name] verbalized understanding and denied any further questions or concerns at this time.

## 2023-03-23 ENCOUNTER — Telehealth: Payer: Medicare Other | Admitting: Pulmonary Disease

## 2023-03-23 DIAGNOSIS — R911 Solitary pulmonary nodule: Secondary | ICD-10-CM | POA: Diagnosis not present

## 2023-03-23 DIAGNOSIS — C3411 Malignant neoplasm of upper lobe, right bronchus or lung: Secondary | ICD-10-CM | POA: Insufficient documentation

## 2023-03-23 NOTE — H&P (View-Only) (Signed)
 Virtual Visit via Video Note  I connected with Alexandra Lamb on 03/23/23 at  1:30 PM EST by a video enabled telemedicine application and verified that I am speaking with the correct person using two identifiers.  Location: Patient: home, met with both sisters, one guardian  Provider: office,     I discussed the limitations of evaluation and management by telemedicine and the availability of in person appointments. The patient expressed understanding and agreed to proceed.  History of Present Illness:  Appt was with both sisters. One of which is patients guardian.   Per Dr. Arbutus Ped - "Alexandra Lamb is a 77 y.o. female presented to the clinic today for initial evaluation accompanied by her sister.Discussed the use of AI scribe software for clinical note transcription with the patient, who gave verbal consent to proceed.   History of Present Illness   The patient, a 77 year old with a history of left breast cancer (treated with mastectomy in 2011), hypertension, diabetes, hypercholesterolemia, and schizophrenia, presents for evaluation of a suspicious spot in the right lung. The patient was unaware of this finding prior to the consultation. The spot was initially identified during a routine physical examination when a CT angiogram of the chest in March 2024 revealed a possible cystic nodule. A subsequent CT scan on 02/21/2023 confirmed the presence of a partially solid cystic nodule in the anterior apical part of the right upper lobe, which had increased in size from 1.2 cm to 1.5 cm over several months.   The patient denies any chest pain, shortness of breath, or cough. However, she reports a recent episode of diarrhea and a significant weight loss of approximately 20 pounds over an unspecified period. The patient also mentions a recent CT and MRI of the brain that showed suspicious meningiomas.   The patient has a significant smoking history, having smoked heavily for a long period, starting in  college and quitting approximately six to seven years ago. The patient denies any alcohol or drug abuse. The family history is notable for a father who died of a heart attack and a mother who died of lung problems related to COPD. 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 She has RUL nodule concerning for malignancy.  Patient unable to participate in today's visit due to baseline mental issues and schizophrenia.  Patient's sister is her guardian     Observations/Objective: N/a   Reviewed CT imaging as well as nuclear medicine pet imaging  Assessment and Plan:  Right upper lobe pulmonary nodule Plan: Hypermetabolic on pet imaging and concerning on CT imaging based on morphology of malignancy. Today we talked about risk-benefit alternatives consideration bronchoscopy. Patients guardians are willing to to sign consent for procedure   Follow Up Instructions:  I discussed the assessment and treatment plan with the patient. The patient was provided an opportunity to ask questions and all were answered. The patient agreed with the plan and demonstrated an understanding of the instructions.   The patient was advised to call back or seek an in-person evaluation if the symptoms worsen or if the condition fails to improve as anticipated.  I provided 16 minutes of non-face-to-face time during this encounter.   Josephine Igo, DO

## 2023-03-23 NOTE — Progress Notes (Signed)
Virtual Visit via Video Note  I connected with Alexandra Lamb on 03/23/23 at  1:30 PM EST by a video enabled telemedicine application and verified that I am speaking with the correct person using two identifiers.  Location: Patient: home, met with both sisters, one guardian  Provider: office,     I discussed the limitations of evaluation and management by telemedicine and the availability of in person appointments. The patient expressed understanding and agreed to proceed.  History of Present Illness:  Appt was with both sisters. One of which is patients guardian.   Per Dr. Arbutus Ped - "Alexandra Lamb is a 77 y.o. female presented to the clinic today for initial evaluation accompanied by her sister.Discussed the use of AI scribe software for clinical note transcription with the patient, who gave verbal consent to proceed.   History of Present Illness   The patient, a 77 year old with a history of left breast cancer (treated with mastectomy in 2011), hypertension, diabetes, hypercholesterolemia, and schizophrenia, presents for evaluation of a suspicious spot in the right lung. The patient was unaware of this finding prior to the consultation. The spot was initially identified during a routine physical examination when a CT angiogram of the chest in March 2024 revealed a possible cystic nodule. A subsequent CT scan on 02/21/2023 confirmed the presence of a partially solid cystic nodule in the anterior apical part of the right upper lobe, which had increased in size from 1.2 cm to 1.5 cm over several months.   The patient denies any chest pain, shortness of breath, or cough. However, she reports a recent episode of diarrhea and a significant weight loss of approximately 20 pounds over an unspecified period. The patient also mentions a recent CT and MRI of the brain that showed suspicious meningiomas.   The patient has a significant smoking history, having smoked heavily for a long period, starting in  college and quitting approximately six to seven years ago. The patient denies any alcohol or drug abuse. The family history is notable for a father who died of a heart attack and a mother who died of lung problems related to COPD. 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 She has RUL nodule concerning for malignancy.  Patient unable to participate in today's visit due to baseline mental issues and schizophrenia.  Patient's sister is her guardian     Observations/Objective: N/a   Reviewed CT imaging as well as nuclear medicine pet imaging  Assessment and Plan:  Right upper lobe pulmonary nodule Plan: Hypermetabolic on pet imaging and concerning on CT imaging based on morphology of malignancy. Today we talked about risk-benefit alternatives consideration bronchoscopy. Patients guardians are willing to to sign consent for procedure   Follow Up Instructions:  I discussed the assessment and treatment plan with the patient. The patient was provided an opportunity to ask questions and all were answered. The patient agreed with the plan and demonstrated an understanding of the instructions.   The patient was advised to call back or seek an in-person evaluation if the symptoms worsen or if the condition fails to improve as anticipated.  I provided 16 minutes of non-face-to-face time during this encounter.   Josephine Igo, DO

## 2023-03-24 ENCOUNTER — Encounter: Payer: Self-pay | Admitting: Pulmonary Disease

## 2023-03-26 ENCOUNTER — Encounter (HOSPITAL_COMMUNITY): Payer: Medicare Other

## 2023-03-29 ENCOUNTER — Emergency Department (HOSPITAL_COMMUNITY): Payer: Medicare Other

## 2023-03-29 ENCOUNTER — Encounter (HOSPITAL_COMMUNITY): Payer: Self-pay | Admitting: Emergency Medicine

## 2023-03-29 ENCOUNTER — Telehealth: Payer: Self-pay | Admitting: Internal Medicine

## 2023-03-29 ENCOUNTER — Emergency Department (HOSPITAL_COMMUNITY)
Admission: EM | Admit: 2023-03-29 | Discharge: 2023-03-30 | Disposition: A | Discharge status: Skilled Nursing Facility | Payer: Medicare Other | Attending: Emergency Medicine | Admitting: Emergency Medicine

## 2023-03-29 DIAGNOSIS — W01198A Fall on same level from slipping, tripping and stumbling with subsequent striking against other object, initial encounter: Secondary | ICD-10-CM | POA: Insufficient documentation

## 2023-03-29 DIAGNOSIS — Z7985 Long-term (current) use of injectable non-insulin antidiabetic drugs: Secondary | ICD-10-CM | POA: Diagnosis not present

## 2023-03-29 DIAGNOSIS — S0101XA Laceration without foreign body of scalp, initial encounter: Secondary | ICD-10-CM | POA: Insufficient documentation

## 2023-03-29 DIAGNOSIS — Z79899 Other long term (current) drug therapy: Secondary | ICD-10-CM | POA: Insufficient documentation

## 2023-03-29 DIAGNOSIS — E119 Type 2 diabetes mellitus without complications: Secondary | ICD-10-CM | POA: Insufficient documentation

## 2023-03-29 DIAGNOSIS — I252 Old myocardial infarction: Secondary | ICD-10-CM | POA: Insufficient documentation

## 2023-03-29 DIAGNOSIS — W19XXXA Unspecified fall, initial encounter: Secondary | ICD-10-CM | POA: Diagnosis not present

## 2023-03-29 DIAGNOSIS — R911 Solitary pulmonary nodule: Secondary | ICD-10-CM | POA: Diagnosis not present

## 2023-03-29 DIAGNOSIS — W010XXA Fall on same level from slipping, tripping and stumbling without subsequent striking against object, initial encounter: Secondary | ICD-10-CM | POA: Insufficient documentation

## 2023-03-29 DIAGNOSIS — I10 Essential (primary) hypertension: Secondary | ICD-10-CM | POA: Insufficient documentation

## 2023-03-29 DIAGNOSIS — R0689 Other abnormalities of breathing: Secondary | ICD-10-CM | POA: Diagnosis not present

## 2023-03-29 DIAGNOSIS — S0990XA Unspecified injury of head, initial encounter: Secondary | ICD-10-CM | POA: Diagnosis not present

## 2023-03-29 LAB — BASIC METABOLIC PANEL
Anion gap: 12 (ref 5–15)
BUN: 22 mg/dL (ref 8–23)
CO2: 26 mmol/L (ref 22–32)
Calcium: 9.5 mg/dL (ref 8.9–10.3)
Chloride: 98 mmol/L (ref 98–111)
Creatinine, Ser: 0.67 mg/dL (ref 0.44–1.00)
GFR, Estimated: >60 mL/min (ref >60)
Glucose, Bld: 167 mg/dL — ABNORMAL HIGH (ref 70–99)
Potassium: 3.4 mmol/L — ABNORMAL LOW (ref 3.5–5.1)
Sodium: 136 mmol/L (ref 135–145)

## 2023-03-29 LAB — CBC
HCT: 39.4 % (ref 36.0–46.0)
Hemoglobin: 13.4 g/dL (ref 12.0–15.0)
MCH: 28.7 pg (ref 26.0–34.0)
MCHC: 34 g/dL (ref 30.0–36.0)
MCV: 84.4 fL (ref 80.0–100.0)
Platelets: 187 10*3/uL (ref 150–400)
RBC: 4.67 MIL/uL (ref 3.87–5.11)
RDW: 13 % (ref 11.5–15.5)
WBC: 8.8 10*3/uL (ref 4.0–10.5)
nRBC: 0 % (ref 0.0–0.2)

## 2023-03-29 MED ORDER — LIDOCAINE-EPINEPHRINE (PF) 2 %-1:200000 IJ SOLN
10.0000 mL | Freq: Once | INTRAMUSCULAR | Status: AC
  Administered 2023-03-29: 10 mL
  Filled 2023-03-29: qty 20

## 2023-03-29 NOTE — Discharge Instructions (Signed)
Follow-up with your primary care doctor or an urgent care to have the staples removed in 7 days.  It is okay to rinse the blood out of your hair this evening.

## 2023-03-29 NOTE — Progress Notes (Signed)
Surgical Instructions   Your procedure is scheduled on Tuesday March 30, 2023. Report to [facility] Main Entrance "A" at 6:45 A.M., then check in with the Admitting office. Any questions or running late day of surgery: call [phone]  Questions prior to your surgery date: call [phone], Monday-Friday, 8am-4pm. If you experience any cold or flu symptoms such as cough, fever, chills, shortness of breath, etc. between now and your scheduled surgery, please notify us at the above number.     Remember:  Do not eat or drink  after midnight the night before your surgery  Take these medicines the morning of surgery with A SIP OF WATER  amLODipine (NORVASC)  atenolol (TENORMIN)  levothyroxine (SYNTHROID)  memantine (NAMENDA)  simvastatin (ZOCOR)   May take these medicines IF NEEDED: acetaminophen (TYLENOL)    One week prior to surgery, STOP taking any Aspirin (unless otherwise instructed by your surgeon) Aleve, Naproxen, Ibuprofen, Motrin, Advil, Goody's, BC's, all herbal medications, fish oil, and non-prescription vitamins.   WHAT DO I DO ABOUT MY DIABETES MEDICATION?   Do not take oral diabetes medicines (pills) the morning of surgery.        DO NOT TAKE YOUR Dulaglutide (TRULICITY) 7 DAYS PRIOR TO SURGERY, WITH THE LAST DOSE BEING NO LATER THAN 03/22/2023.     The day of surgery, do not take other diabetes injectables, including Byetta (exenatide), Bydureon (exenatide ER), Victoza (liraglutide), or Trulicity (dulaglutide).  If your CBG is greater than 220 mg/dL, you may take  of your sliding scale (correction) dose of insulin.   HOW TO MANAGE YOUR DIABETES BEFORE AND AFTER SURGERY  Why is it important to control my blood sugar before and after surgery? Improving blood sugar levels before and after surgery helps healing and can limit problems. A way of improving blood sugar control is eating a healthy diet by:  Eating less sugar and carbohydrates  Increasing  activity/exercise  Talking with your doctor about reaching your blood sugar goals High blood sugars (greater than 180 mg/dL) can raise your risk of infections and slow your recovery, so you will need to focus on controlling your diabetes during the weeks before surgery. Make sure that the doctor who takes care of your diabetes knows about your planned surgery including the date and location.  How do I manage my blood sugar before surgery? Check your blood sugar at least 4 times a day, starting 2 days before surgery, to make sure that the level is not too high or low.  Check your blood sugar the morning of your surgery when you wake up and every 2 hours until you get to the Short Stay unit.  If your blood sugar is less than 70 mg/dL, you will need to treat for low blood sugar: Do not take insulin. Treat a low blood sugar (less than 70 mg/dL) with  cup of clear juice (cranberry or apple), 4 glucose tablets, OR glucose gel. Recheck blood sugar in 15 minutes after treatment (to make sure it is greater than 70 mg/dL). If your blood sugar is not greater than 70 mg/dL on recheck, call [phone] for further instructions. Report your blood sugar to the short stay nurse when you get to Short Stay.  If you are admitted to the hospital after surgery: Your blood sugar will be checked by the staff and you will probably be given insulin after surgery (instead of oral diabetes medicines) to make sure you have good blood sugar levels. The goal for blood sugar control  after surgery is 80-180 mg/dL.                      Do NOT Smoke (Tobacco/Vaping) for 24 hours prior to your procedure.  If you use a CPAP at night, you may bring your mask/headgear for your overnight stay.   You will be asked to remove any contacts, glasses, piercing's, hearing aid's, dentures/partials prior to surgery. Please bring cases for these items if needed.    Patients discharged the day of surgery will not be allowed to drive  home, and someone needs to stay with them for 24 hours.  SURGICAL WAITING ROOM VISITATION Patients may have no more than 2 support people in the waiting area - these visitors may rotate.   Pre-op nurse will coordinate an appropriate time for 1 ADULT support person, who may not rotate, to accompany patient in pre-op.  Children under the age of 48 must have an adult with them who is not the patient and must remain in the main waiting area with an adult.  If the patient needs to stay at the hospital during part of their recovery, the visitor guidelines for inpatient rooms apply.  Please refer to the [facility] website for the visitor guidelines for any additional information.   If you received a COVID test during your pre-op visit  it is requested that you wear a mask when out in public, stay away from anyone that may not be feeling well and notify your surgeon if you develop symptoms. If you have been in contact with anyone that has tested positive in the last 10 days please notify you surgeon.      Pre-operative CHG Bathing Instructions   You can play a key role in reducing the risk of infection after surgery. Your skin needs to be as free of germs as possible. You can reduce the number of germs on your skin by washing with Dial soap before surgery.              TAKE A SHOWER THE NIGHT BEFORE SURGERY AND THE DAY OF SURGERY    Please keep in mind the following:  DO NOT shave, including legs and underarms, 48 hours prior to surgery.   You may shave your face before/day of surgery.  Place clean sheets on your bed the night before surgery Use a clean washcloth (not used since being washed) for each shower.  Horticulturist, commercial and private area with [employer]. If you choose to wash your hair, wash first with your normal shampoo.  After you use shampoo/soap, rinse your hair and body thoroughly to remove shampoo/soap residue.  3.   Pat dry with a clean towel  4.    Put on clean pajamas    Additional instructions for the day of surgery: DO NOT APPLY any lotions, deodorants or perfumes.   Do not wear jewelry or makeup Do not wear nail polish, gel polish, artificial nails, or any other type of covering on natural nails (fingers and toes) Do not bring valuables to the hospital. [facility] is not responsible for valuables/personal belongings. Put on clean/comfortable clothes.  Please brush your teeth.  Ask your nurse before applying any prescription medications to the skin.

## 2023-03-29 NOTE — ED Provider Notes (Signed)
[place] EMERGENCY DEPARTMENT AT [facility] Provider Note   CSN: [id number] Arrival date & time: 03/29/23  2144     History  Chief Complaint  Patient presents with   Alexandra Lamb is a 77 y.o. female.   Fall     Patient has a history of diabetes hyperlipidemia hypertension myocardial infarction cognitive impairment who presents to the ED for evaluation of a head injury.  Patient states she was at [facility] facility.  Patient states she was reaching up for something while trying to use her walker when she ended up losing her balance and fell backwards.  Patient struck the back of her head.  She did sustain a laceration.  She denies any other injuries.  No back pain no chest pain no abdominal pain.  No extremity pain.  She did not lose consciousness.  She does not have a headache  Home Medications Prior to Admission medications   Medication Sig Start Date End Date Taking? Authorizing Provider  acetaminophen (TYLENOL) 500 MG tablet Take 500 mg by mouth every 6 (six) hours as needed.      [provider]  amLODipine (NORVASC) 2.5 MG tablet Take 2.5 mg by mouth daily.    [provider]  amoxicillin (AMOXIL) 500 MG capsule Take 1 capsule (500 mg total) by mouth 3 (three) times daily for 4 days Patient not taking: Reported on 03/29/2023 06/23/22   Willeen Niece, MD  atenolol (TENORMIN) 100 MG tablet Take 100 mg by mouth daily.    [provider]  Cholecalciferol (RA VITAMIN D-3) 2000 units CAPS Take 1 capsule (2,000 Units total) by mouth daily. 12/21/17   Serena Croissant, MD  divalproex (DEPAKOTE ER) 500 MG 24 hr tablet 1,500 mg at bedtime. 09/28/19   [provider]  Dulaglutide (TRULICITY) 4.5 MG/0.5ML SOPN Inject 0.5 mLs into the skin every 7 (seven) days.    [provider]  latanoprost (XALATAN) 0.005 % ophthalmic solution Place 1 drop into both eyes at bedtime.    [provider]  levothyroxine  (SYNTHROID) 112 MCG tablet Take 112 mcg by mouth daily before breakfast.    [provider]  memantine (NAMENDA) 10 MG tablet Take 10 mg by mouth 2 (two) times daily.    [provider]  OLANZapine (ZYPREXA) 15 MG tablet Take 15 mg by mouth at bedtime.    [provider]  simvastatin (ZOCOR) 20 MG tablet Take 20 mg by mouth daily.    [provider]  valsartan-hydrochlorothiazide (DIOVAN-HCT) 320-12.5 MG tablet Take 1 tablet by mouth daily.    [provider]      Allergies    Patient has no known allergies.    Review of Systems   Review of Systems  Physical Exam Updated Vital Signs BP (!) 182/71   Pulse 89   Temp (!) 96.8 F (36 C) (Axillary)   Resp 18   Ht 1.702 m (5\' 7" )   Wt 63 kg   SpO2 98%   BMI 21.75 kg/m  Physical Exam Vitals and nursing note reviewed.  Constitutional:      General: She is not in acute distress.    Appearance: She is well-developed.  HENT:     Head: Normocephalic.     Comments: Hematoma and small laceration posterior scalp, no active bleeding    Right Ear: External ear normal.     Left Ear: External ear normal.  Eyes:     General:  No scleral icterus.       Right eye: No discharge.        Left eye: No discharge.     Conjunctiva/sclera: Conjunctivae normal.  Neck:     Trachea: No tracheal deviation.  Cardiovascular:     Rate and Rhythm: Normal rate and regular rhythm.  Pulmonary:     Effort: Pulmonary effort is normal. No respiratory distress.     Breath sounds: Normal breath sounds. No stridor. No wheezing or rales.  Abdominal:     General: Bowel sounds are normal. There is no distension.     Palpations: Abdomen is soft.     Tenderness: There is no abdominal tenderness. There is no guarding or rebound.  Musculoskeletal:        General: No tenderness or deformity.     Cervical back: Neck supple.  Skin:    General: Skin is warm and dry.     Findings: No rash.  Neurological:     General: No  focal deficit present.     Mental Status: She is alert.     Cranial Nerves: No cranial nerve deficit, dysarthria or facial asymmetry.     Sensory: No sensory deficit.     Motor: No abnormal muscle tone or seizure activity.     Coordination: Coordination normal.  Psychiatric:        Mood and Affect: Mood normal.     ED Results / Procedures / Treatments   Labs (all labs ordered are listed, but only abnormal results are displayed) Labs Reviewed  BASIC METABOLIC PANEL - Abnormal; Notable for the following components:      Result Value   Potassium 3.4 (*)    Glucose, Bld 167 (*)    All other components within normal limits  CBC    EKG None  Radiology CT Head Wo Contrast  Result Date: 03/29/2023 CLINICAL DATA:  Fall, posterior head injury EXAM: CT HEAD WITHOUT CONTRAST CT CERVICAL SPINE WITHOUT CONTRAST TECHNIQUE: Multidetector CT imaging of the head and cervical spine was performed following the standard protocol without intravenous contrast. Multiplanar CT image reconstructions of the cervical spine were also generated. RADIATION DOSE REDUCTION: This exam was performed according to the departmental dose-optimization program which includes automated exposure control, adjustment of the mA and/or kV according to patient size and/or use of iterative reconstruction technique. COMPARISON:  MRI brain dated 06/21/2022 FINDINGS: CT HEAD FINDINGS Brain: No evidence of acute infarction, hemorrhage, hydrocephalus, extra-axial collection or mass lesion/mass effect. Stable 10 mm meningioma overlying the left cerebellum (series 3/image 9). Mild subcortical white matter and periventricular small vessel ischemic changes. Vascular: Intracranial atherosclerosis. Skull: Normal. Negative for fracture or focal lesion. Sinuses/Orbits: Partial opacification of the right frontal, bilateral ethmoid, bilateral maxillary, and left sphenoid sinuses. Mastoid air cells are clear. Other: Moderate extracranial hematoma  overlying the left parietal vertex (series 8/image 23). CT CERVICAL SPINE FINDINGS Motion degraded images. Alignment: Normal cervical lordosis. Skull base and vertebrae: No acute fracture. No primary bone lesion or focal pathologic process. Soft tissues and spinal canal: No prevertebral fluid or swelling. No visible canal hematoma. Disc levels: Mild degenerative changes at C5-6. Spinal canal is patent. Upper chest: Visualized lung apices are clear. Other: Visualized thyroid is grossly unremarkable. IMPRESSION: Moderate extracranial hematoma overlying the left parietal vertex. No evidence of calvarial fracture. No acute intracranial abnormality. Mild small vessel ischemic changes. 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 No traumatic injury to the cervical spine, noting motion degradation. Electronically Signed  By: Charline Bills M.D.   On: 03/29/2023 23:23   CT Cervical Spine Wo Contrast  Result Date: 03/29/2023 CLINICAL DATA:  Fall, posterior head injury EXAM: CT HEAD WITHOUT CONTRAST CT CERVICAL SPINE WITHOUT CONTRAST TECHNIQUE: Multidetector CT imaging of the head and cervical spine was performed following the standard protocol without intravenous contrast. Multiplanar CT image reconstructions of the cervical spine were also generated. RADIATION DOSE REDUCTION: This exam was performed according to the departmental dose-optimization program which includes automated exposure control, adjustment of the mA and/or kV according to patient size and/or use of iterative reconstruction technique. COMPARISON:  MRI brain dated 06/21/2022 FINDINGS: CT HEAD FINDINGS Brain: No evidence of acute infarction, hemorrhage, hydrocephalus, extra-axial collection or mass lesion/mass effect. Stable 10 mm meningioma overlying the left cerebellum (series 3/image 9). Mild subcortical white matter and periventricular small vessel ischemic changes. Vascular: Intracranial atherosclerosis. Skull: Normal. Negative  for fracture or focal lesion. Sinuses/Orbits: Partial opacification of the right frontal, bilateral ethmoid, bilateral maxillary, and left sphenoid sinuses. Mastoid air cells are clear. Other: Moderate extracranial hematoma overlying the left parietal vertex (series 8/image 23). CT CERVICAL SPINE FINDINGS Motion degraded images. Alignment: Normal cervical lordosis. Skull base and vertebrae: No acute fracture. No primary bone lesion or focal pathologic process. Soft tissues and spinal canal: No prevertebral fluid or swelling. No visible canal hematoma. Disc levels: Mild degenerative changes at C5-6. Spinal canal is patent. Upper chest: Visualized lung apices are clear. Other: Visualized thyroid is grossly unremarkable. IMPRESSION: Moderate extracranial hematoma overlying the left parietal vertex. No evidence of calvarial fracture. No acute intracranial abnormality. Mild small vessel ischemic changes. 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 No traumatic injury to the cervical spine, noting motion degradation. Electronically Signed   By: Charline Bills M.D.   On: 03/29/2023 23:23    Procedures .Laceration Repair  Date/Time: 03/30/2023 12:01 AM  Performed by: Linwood Dibbles, MD Authorized by: Linwood Dibbles, MD   Consent:    Consent obtained:  Verbal   Consent given by:  Patient   Risks, benefits, and alternatives were discussed: yes     Risks discussed:  Infection Universal protocol:    Patient identity confirmed:  Verbally with patient Anesthesia:    Anesthesia method:  Local infiltration   Local anesthetic:  Lidocaine 1% WITH epi Laceration details:    Location:  Scalp   Length (cm):  3 Pre-procedure details:    Preparation:  Patient was prepped and draped in usual sterile fashion Exploration:    Wound extent: no underlying fracture and no vascular damage   Treatment:    Area cleansed with:  Saline   Amount of cleaning:  Standard   Irrigation method:  Syringe    Visualized foreign bodies/material removed: no     Debridement:  None   Undermining:  None Skin repair:    Repair method:  Staples   Number of staples:  4 Approximation:    Approximation:  Close Repair type:    Repair type:  Simple Post-procedure details:    Dressing:  Open (no dressing)   Procedure completion:  Tolerated well, no immediate complications     Medications Ordered in ED Medications  lidocaine-EPINEPHrine (XYLOCAINE W/EPI) 2 %-1:200000 (PF) injection 10 mL (10 mLs Infiltration Given by Other 03/29/23 2247)    ED Course/ Medical Decision Making/ A&P Clinical Course as of 03/30/23 0003  Mon Mar 29, 2023  2333 CBC and metabolic panel unremarkable [JK]  2334 C-spine CT without acute findings.  Head  CT shows no intracranial bleeding or skull fracture [JK]    Clinical Course User Index [JK] Linwood Dibbles, MD                                 Medical Decision Making Problems Addressed: Fall, initial encounter: acute illness or injury that poses a threat to life or bodily functions Laceration of scalp, initial encounter: acute illness or injury that poses a threat to life or bodily functions  Amount and/or Complexity of Data Reviewed Labs: ordered. Decision-making details documented in ED Course. Radiology: ordered and independent interpretation performed.  Risk Prescription drug management.   Patient presented to the ED for evaluation after a fall.  Pt stumbled, lost her balance.  No LOC.  No syncope.  CT scan does not show subdural, subarachnoid or fracture.  External hematoma noted at site of laceration.  Laceration repaired without difficulty. Evaluation and diagnostic testing in the emergency department does not suggest an emergent condition requiring admission or immediate intervention beyond what has been performed at this time.  The patient is safe for discharge and has been instructed to return immediately for worsening symptoms, change in symptoms or any other  concerns.         Final Clinical Impression(s) / ED Diagnoses Final diagnoses:  Laceration of scalp, initial encounter  Fall, initial encounter    Rx / DC Orders ED Discharge Orders     None         Linwood Dibbles, MD 03/30/23 0003

## 2023-03-29 NOTE — Telephone Encounter (Signed)
Morrie Sheldon from patient PCP office called requesting notes from results from visit. Reached out to DR. Mohamed nurse Vincent Peyer to forward requested documents over to pcp.

## 2023-03-29 NOTE — ED Notes (Signed)
10:59 PM  Patient transported to CT.

## 2023-03-29 NOTE — Progress Notes (Signed)
SDW call - attempted.    Multiple calls with messages to [facility], [phone]. Messages were not returned.  A copy of the pre-surgical instructions were faxed to the facility at [phone].  Unable to verify patients history or medication list.  Spoke with patients Alexandra Lamb, sister at. [phone].  Her sister Alexandra Lamb, will be bringing the patient to the hospital in the morning from the Assisted Living facility.  She was given arrival time of 0645, NPO status.    Due to not being able to speak to Healthcare staff at the facility, it is unknown when her last dose of Trulicity was, or the range of blood sugars.

## 2023-03-29 NOTE — ED Triage Notes (Signed)
BIB EMS from [facility] for unwit fall. Patient reports that she was reaching up to get something off of a box and fell backwards. Denies LOC. Denies thinners. She is alert and oriented x 4. Hematoma noted to posterior head.

## 2023-03-30 ENCOUNTER — Other Ambulatory Visit: Payer: Self-pay

## 2023-03-30 ENCOUNTER — Ambulatory Visit (HOSPITAL_COMMUNITY): Payer: Medicare Other | Admitting: Physician Assistant

## 2023-03-30 ENCOUNTER — Ambulatory Visit (HOSPITAL_COMMUNITY): Payer: Medicare Other

## 2023-03-30 ENCOUNTER — Encounter (HOSPITAL_COMMUNITY)
Admission: AD | Disposition: A | Discharge status: Home / Self Care | Payer: Self-pay | Source: Ambulatory Visit | Attending: Pulmonary Disease

## 2023-03-30 ENCOUNTER — Ambulatory Visit (HOSPITAL_COMMUNITY)
Admission: AD | Admit: 2023-03-30 | Discharge: 2023-03-30 | Disposition: A | Discharge status: Home / Self Care | Payer: Medicare Other | Source: Ambulatory Visit | Attending: Pulmonary Disease | Admitting: Pulmonary Disease

## 2023-03-30 ENCOUNTER — Encounter (HOSPITAL_COMMUNITY): Payer: Self-pay | Admitting: Pulmonary Disease

## 2023-03-30 ENCOUNTER — Ambulatory Visit (HOSPITAL_BASED_OUTPATIENT_CLINIC_OR_DEPARTMENT_OTHER): Payer: Medicare Other | Admitting: Physician Assistant

## 2023-03-30 DIAGNOSIS — I252 Old myocardial infarction: Secondary | ICD-10-CM | POA: Insufficient documentation

## 2023-03-30 DIAGNOSIS — W010XXA Fall on same level from slipping, tripping and stumbling without subsequent striking against object, initial encounter: Secondary | ICD-10-CM | POA: Insufficient documentation

## 2023-03-30 DIAGNOSIS — Z48813 Encounter for surgical aftercare following surgery on the respiratory system: Secondary | ICD-10-CM | POA: Diagnosis not present

## 2023-03-30 DIAGNOSIS — R911 Solitary pulmonary nodule: Secondary | ICD-10-CM

## 2023-03-30 DIAGNOSIS — C3411 Malignant neoplasm of upper lobe, right bronchus or lung: Secondary | ICD-10-CM | POA: Diagnosis present

## 2023-03-30 DIAGNOSIS — Z7985 Long-term (current) use of injectable non-insulin antidiabetic drugs: Secondary | ICD-10-CM | POA: Insufficient documentation

## 2023-03-30 DIAGNOSIS — I10 Essential (primary) hypertension: Secondary | ICD-10-CM | POA: Diagnosis not present

## 2023-03-30 DIAGNOSIS — R918 Other nonspecific abnormal finding of lung field: Secondary | ICD-10-CM | POA: Diagnosis not present

## 2023-03-30 DIAGNOSIS — E119 Type 2 diabetes mellitus without complications: Secondary | ICD-10-CM | POA: Diagnosis not present

## 2023-03-30 DIAGNOSIS — S0101XA Laceration without foreign body of scalp, initial encounter: Secondary | ICD-10-CM | POA: Insufficient documentation

## 2023-03-30 DIAGNOSIS — Z538 Procedure and treatment not carried out for other reasons: Secondary | ICD-10-CM | POA: Diagnosis not present

## 2023-03-30 LAB — GLUCOSE, CAPILLARY
Glucose-Capillary: 152 mg/dL — ABNORMAL HIGH (ref 70–99)
Glucose-Capillary: 120 mg/dL — ABNORMAL HIGH (ref 70–99)

## 2023-03-30 SURGERY — ROBOTIC ASSISTED NAVIGATIONAL BRONCHOSCOPY
Anesthesia: General | Laterality: Right

## 2023-03-30 SURGERY — BRONCHOSCOPY, WITH BIOPSY USING ELECTROMAGNETIC NAVIGATION
Anesthesia: General | Laterality: Right

## 2023-03-30 MED ORDER — DEXMEDETOMIDINE HCL IN NACL 80 MCG/20ML IV SOLN
INTRAVENOUS | Status: DC | PRN
  Administered 2023-03-30 (×2): 10 ug via INTRAVENOUS

## 2023-03-30 MED ORDER — LIDOCAINE 2% (20 MG/ML) 5 ML SYRINGE
INTRAMUSCULAR | Status: DC | PRN
  Administered 2023-03-30: 50 mg via INTRAVENOUS

## 2023-03-30 MED ORDER — PROPOFOL 10 MG/ML IV BOLUS
INTRAVENOUS | Status: DC | PRN
  Administered 2023-03-30: 100 mg via INTRAVENOUS
  Administered 2023-03-30: 150 ug/kg/min via INTRAVENOUS

## 2023-03-30 MED ORDER — SODIUM CHLORIDE 0.9 % IV SOLN: INTRAVENOUS | Status: DC

## 2023-03-30 MED ORDER — ONDANSETRON HCL 4 MG/2ML IJ SOLN
INTRAMUSCULAR | Status: DC | PRN
  Administered 2023-03-30: 4 mg via INTRAVENOUS

## 2023-03-30 MED ORDER — ROCURONIUM BROMIDE 10 MG/ML (PF) SYRINGE
PREFILLED_SYRINGE | INTRAVENOUS | Status: DC | PRN
  Administered 2023-03-30: 40 mg via INTRAVENOUS

## 2023-03-30 MED ORDER — ATENOLOL 100 MG PO TABS
100.0000 mg | ORAL_TABLET | Freq: Once | ORAL | Status: AC
  Administered 2023-03-30: 100 mg via ORAL
  Filled 2023-03-30 (×2): qty 1

## 2023-03-30 MED ORDER — CHLORHEXIDINE GLUCONATE 0.12 % MT SOLN
15.0000 mL | Freq: Once | OROMUCOSAL | Status: AC
  Administered 2023-03-30: 15 mL via OROMUCOSAL

## 2023-03-30 MED ORDER — PHENYLEPHRINE HCL-NACL 20-0.9 MG/250ML-% IV SOLN
INTRAVENOUS | Status: DC | PRN
  Administered 2023-03-30 (×2): 80 ug via INTRAVENOUS

## 2023-03-30 MED ORDER — SUGAMMADEX SODIUM 200 MG/2ML IV SOLN
INTRAVENOUS | Status: DC | PRN
  Administered 2023-03-30: 200 mg via INTRAVENOUS

## 2023-03-30 MED ORDER — DEXAMETHASONE SODIUM PHOSPHATE 10 MG/ML IJ SOLN
INTRAMUSCULAR | Status: DC | PRN
  Administered 2023-03-30: 10 mg via INTRAVENOUS

## 2023-03-30 MED ORDER — CHLORHEXIDINE GLUCONATE 0.12 % MT SOLN
OROMUCOSAL | Status: AC
  Filled 2023-03-30: qty 15

## 2023-03-30 NOTE — H&P (Signed)
Synopsis: Referred in Dec 2024 for lung nodule by No ref. provider found  Subjective:   PATIENT ID: Alexandra Lamb GENDER: female DOB: 12-Jan-1946, MRN: [id number]  No chief complaint on file.   Per Alexandra Lamb - "Alexandra Lamb is a 77 y.o. female presented to the clinic today for initial evaluation accompanied by her sister.Discussed the use of AI scribe software for clinical note transcription with the patient, who gave verbal consent to proceed.   History of Present Illness   The patient, a 77 year old with a history of left breast cancer (treated with mastectomy in 2011), hypertension, diabetes, hypercholesterolemia, and schizophrenia, presents for evaluation of a suspicious spot in the right lung. The patient was unaware of this finding prior to the consultation. The spot was initially identified during a routine physical examination when a CT angiogram of the chest in March 2024 revealed a possible cystic nodule. A subsequent CT scan on 02/21/2023 confirmed the presence of a partially solid cystic nodule in the anterior apical part of the right upper lobe, which had increased in size from 1.2 cm to 1.5 cm over several months.   The patient denies any chest pain, shortness of breath, or cough. However, she reports a recent episode of diarrhea and a significant weight loss of approximately 20 pounds over an unspecified period. The patient also mentions a recent CT and MRI of the brain that showed suspicious meningiomas.   The patient has a significant smoking history, having smoked heavily for a long period, starting in college and quitting approximately six to seven years ago. The patient denies any alcohol or drug abuse. The family history is notable for a father who died of a heart attack and a mother who died of lung problems related to COPD. The patient is a widow, has no children, and previously worked in IT consultant."   OV 12/3 - patient was referred to me for evaluation of the  abnormal CT chest. She has RUL nodule concerning for malignancy.  Patient unable to participate in today's visit due to baseline mental issues and schizophrenia.  Patient's sister is her guardian  OV 12/10: plans for bronchoscopy today. Family at bedside for consent   Past Medical History:  Diagnosis Date   Cancer [facility])    left breast   Diabetes mellitus    Hyperlipidemia    Hypertension    MCI (mild cognitive impairment)    Schizophrenia (HCC)    paranoid     Family History  Problem Relation Age of Onset   Cancer Maternal Aunt        brain tumor   Dementia Neg Hx      Past Surgical History:  Procedure Laterality Date   BREAST SURGERY  2011   left breast mast   MASTECTOMY     left   TOOTH EXTRACTION      Social History   Socioeconomic History   Marital status: Widowed    Spouse name: Not on file   Number of children: Not on file   Years of education: Not on file   Highest education level: Not on file  Occupational History   Not on file  Tobacco Use   Smoking status: Former    Current packs/day: 0.00    Types: Cigarettes    Quit date: 04/21/2007    Years since quitting: 15.9   Smokeless tobacco: Never  Vaping Use   Vaping status: Never Used  Substance and Sexual Activity   Alcohol use: No  Drug use: No   Sexual activity: Not Currently  Other Topics Concern   Not on file  Social History Narrative   Resides at [facility]   Social Determinants of Health   Financial Resource Strain: Not on file  Food Insecurity: Not on file  Transportation Needs: Not on file  Physical Activity: Not on file  Stress: Not on file  Social Connections: Not on file  Intimate Partner Violence: Not on file     No Known Allergies   @ENCMEDSTART @  Review of Systems  Constitutional:  Negative for chills, fever, malaise/fatigue and weight loss.  HENT:  Negative for hearing loss, sore throat and tinnitus.   Eyes:  Negative for blurred vision and double vision.   Respiratory:  Negative for cough, hemoptysis, sputum production, shortness of breath, wheezing and stridor.   Cardiovascular:  Negative for chest pain, palpitations, orthopnea, leg swelling and PND.  Gastrointestinal:  Negative for abdominal pain, constipation, diarrhea, heartburn, nausea and vomiting.  Genitourinary:  Negative for dysuria, hematuria and urgency.  Musculoskeletal:  Negative for joint pain and myalgias.  Skin:  Negative for itching and rash.  Neurological:  Negative for dizziness, tingling, weakness and headaches.  Endo/Heme/Allergies:  Negative for environmental allergies. Does not bruise/bleed easily.  Psychiatric/Behavioral:  Negative for depression. The patient is not nervous/anxious and does not have insomnia.   All other systems reviewed and are negative.    Objective:  Physical Exam Vitals reviewed.  Constitutional:      General: She is not in acute distress.    Appearance: She is well-developed.  HENT:     Head: Normocephalic and atraumatic.  Eyes:     General: No scleral icterus.    Conjunctiva/sclera: Conjunctivae normal.     Pupils: Pupils are equal, round, and reactive to light.  Neck:     Vascular: No JVD.     Trachea: No tracheal deviation.  Cardiovascular:     Rate and Rhythm: Normal rate and regular rhythm.     Heart sounds: Normal heart sounds. No murmur heard. Pulmonary:     Effort: Pulmonary effort is normal. No tachypnea, accessory muscle usage or respiratory distress.     Breath sounds: No stridor. No wheezing, rhonchi or rales.  Abdominal:     General: There is no distension.     Palpations: Abdomen is soft.     Tenderness: There is no abdominal tenderness.  Musculoskeletal:        General: No tenderness.     Cervical back: Neck supple.  Lymphadenopathy:     Cervical: No cervical adenopathy.  Skin:    General: Skin is warm and dry.     Capillary Refill: Capillary refill takes less than 2 seconds.     Findings: No rash.   Neurological:     Mental Status: She is alert and oriented to person, place, and time.  Psychiatric:        Behavior: Behavior normal.      Vitals:   03/30/23 0723  BP: (!) 179/73  Pulse: 95  Resp: 18  Temp: 97.8 F (36.6 C)  TempSrc: Oral  SpO2: 100%  Weight: 63 kg  Height: 5\' 7"  (1.702 m)   100% on RA BMI Readings from Last 3 Encounters:  03/30/23 21.75 kg/m  03/29/23 21.75 kg/m  03/10/23 21.50 kg/m   Wt Readings from Last 3 Encounters:  03/30/23 63 kg  03/29/23 63 kg  03/10/23 62.3 kg     CBC    Component Value Date/Time  WBC 8.8 03/29/2023 2242   RBC 4.67 03/29/2023 2242   HGB 13.4 03/29/2023 2242   HGB 13.7 03/10/2023 1311   HGB 12.5 06/19/2014 1041   HCT 39.4 03/29/2023 2242   HCT 37.3 06/19/2014 1041   PLT 187 03/29/2023 2242   PLT 251 03/10/2023 1311   PLT 143 (L) 06/19/2014 1041   MCV 84.4 03/29/2023 2242   MCV 89.7 06/19/2014 1041   MCH 28.7 03/29/2023 2242   MCHC 34.0 03/29/2023 2242   RDW 13.0 03/29/2023 2242   RDW 13.3 06/19/2014 1041   LYMPHSABS 2.1 03/10/2023 1311   LYMPHSABS 1.1 06/19/2014 1041   MONOABS 0.6 03/10/2023 1311   MONOABS 0.4 06/19/2014 1041   EOSABS 0.1 03/10/2023 1311   EOSABS 0.0 06/19/2014 1041   BASOSABS 0.0 03/10/2023 1311   BASOSABS 0.0 06/19/2014 1041      Chest Imaging:   IMPRESSION: 1. The 1.2 by 0.8 cm apical segment right upper lobe nodule has a maximum SUV of 3.7, suspicious for malignancy. 2. No hypermetabolic adenopathy or additional significant hypermetabolic lesion identified. 3. Bilateral benign adrenal adenomas. 4. Mild chronic right maxillary sinusitis. 5. Coronary, aortic arch, and branch vessel atherosclerotic vascular disease. Mild cardiomegaly. 6. Left mastectomy and left axillary dissection.  Pulmonary Functions Testing Results:     No data to display          FeNO:   Pathology:   Echocardiogram:   Heart Catheterization:     Assessment & Plan:    RUL lung  nodule  Discussion: Here today for RAB Discussed risks benefits and alternatives  Patient is agreeable to proceed.     Current Facility-Administered Medications:    0.9 %  sodium chloride infusion, , Intravenous, Continuous, Mal Amabile, MD   chlorhexidine (PERIDEX) 0.12 % solution, , , ,   Josephine Igo, DO [facility] 03/30/2023 8:26 AM

## 2023-03-30 NOTE — Anesthesia Postprocedure Evaluation (Signed)
Anesthesia Post Note  Patient: Alexandra Lamb  Procedure(s) Performed: ROBOTIC ASSISTED NAVIGATIONAL BRONCHOSCOPY WITH FLURO (Right)     Patient location during evaluation: PACU Anesthesia Type: General Level of consciousness: awake and alert and oriented Pain management: pain level controlled Vital Signs Assessment: post-procedure vital signs reviewed and stable Respiratory status: spontaneous breathing, nonlabored ventilation and respiratory function stable Cardiovascular status: blood pressure returned to baseline and stable Postop Assessment: no apparent nausea or vomiting Anesthetic complications: no   No notable events documented.  Last Vitals:  Vitals:   03/30/23 1015 03/30/23 1030  BP: 134/60 (!) 148/67  Pulse: 75 78  Resp: 19 (!) 23  Temp:  36.4 C  SpO2: 97% 96%    Last Pain:  Vitals:   03/30/23 1030  TempSrc:   PainSc: 0-No pain                 [name] A.

## 2023-03-30 NOTE — Progress Notes (Signed)
Dr. Tonia Brooms made aware that the patient fell yesterday and was evaluated in the ED. Ok to proceed per Dr. Tonia Brooms.

## 2023-03-30 NOTE — Transfer of Care (Signed)
Immediate Anesthesia Transfer of Care Note  Patient: Alexandra Lamb  Procedure(s) Performed: ROBOTIC ASSISTED NAVIGATIONAL BRONCHOSCOPY WITH FLURO (Right)  Patient Location: PACU  Anesthesia Type:General  Level of Consciousness: sedated and drowsy  Airway & Oxygen Therapy: Patient Spontanous Breathing and Patient connected to face mask oxygen  Post-op Assessment: Report given to RN and Post -op Vital signs reviewed and stable  Post vital signs: Reviewed and stable  Last Vitals:  Vitals Value Taken Time  BP 125/57 03/30/23 0950  Temp 97.3   Pulse 71 03/30/23 0951  Resp 27 03/30/23 0951  SpO2 100 % 03/30/23 0951  Vitals shown include unfiled device data.  Last Pain:  Vitals:   03/30/23 0732  TempSrc:   PainSc: 0-No pain      Patients Stated Pain Goal: 0 (03/30/23 0732)  Complications: No notable events documented.

## 2023-03-30 NOTE — Discharge Instructions (Signed)
Flexible Bronchoscopy, Care After This sheet gives you information about how to care for yourself after your test. Your doctor may also give you more specific instructions. If you have problems or questions, contact your doctor. Follow these instructions at home: Eating and drinking Do not eat or drink anything (not even water) for 2 hours after your test, or until your numbing medicine (local anesthetic) wears off. When your numbness is gone and your cough and gag reflexes have come back, you may: Eat only soft foods. Slowly drink liquids. The day after the test, go back to your normal diet. Driving Do not drive for 24 hours if you were given a medicine to help you relax (sedative). Do not drive or use heavy machinery while taking prescription pain medicine. General instructions  Take over-the-counter and prescription medicines only as told by your doctor. Return to your normal activities as told. Ask what activities are safe for you. Do not use any products that have nicotine or tobacco in them. This includes cigarettes and e-cigarettes. If you need help quitting, ask your doctor. Keep all follow-up visits as told by your doctor. This is important. It is very important if you had a tissue sample (biopsy) taken. Get help right away if: You have shortness of breath that gets worse. You get light-headed. You feel like you are going to pass out (faint). You have chest pain. You cough up: More than a little blood. More blood than before. Summary Do not eat or drink anything (not even water) for 2 hours after your test, or until your numbing medicine wears off. Do not use cigarettes. Do not use e-cigarettes. Get help right away if you have chest pain.  This information is not intended to replace advice given to you by your health care provider. Make sure you discuss any questions you have with your health care provider. Document Released: 02/01/2009 Document Revised: 03/19/2017 Document  Reviewed: 04/24/2016 Elsevier Patient Education  2020 [employer]  

## 2023-03-30 NOTE — Anesthesia Preprocedure Evaluation (Addendum)
Anesthesia Evaluation  Patient identified by MRN, date of birth, ID band Patient awake    Reviewed: Allergy & Precautions, NPO status , Patient's Chart, lab work & pertinent test results, reviewed documented beta blocker date and time   Airway Mallampati: II  TM Distance: >3 FB     Dental  (+) Edentulous Upper, Edentulous Lower   Pulmonary former smoker Right lung nodule   Pulmonary exam normal breath sounds clear to auscultation       Cardiovascular hypertension, Pt. on medications and Pt. on home beta blockers Normal cardiovascular exam Rhythm:Regular Rate:Normal     Neuro/Psych  PSYCHIATRIC DISORDERS    Schizophrenia  Cognitive impairmentHOH bilateral Glaucoma negative neurological ROS     GI/Hepatic negative GI ROS, Neg liver ROS,,,  Endo/Other  diabetes, Poorly Controlled, Type 2, Oral Hypoglycemic Agents  GLP-1 RA last dose 12/4 Left Breast Ca S/P mastectomy Hyperlipidemia  Renal/GU negative Renal ROS  negative genitourinary   Musculoskeletal negative musculoskeletal ROS (+)    Abdominal   Peds  Hematology negative hematology ROS (+)   Anesthesia Other Findings   Reproductive/Obstetrics                             Anesthesia Physical Anesthesia Plan  ASA: 3  Anesthesia Plan: General   Post-op Pain Management: Minimal or no pain anticipated   Induction: Intravenous  PONV Risk Score and Plan: 3 and Treatment may vary due to age or medical condition, TIVA and Ondansetron  Airway Management Planned: Oral ETT  Additional Equipment: None  Intra-op Plan:   Post-operative Plan: Extubation in OR  Informed Consent: I have reviewed the patients History and Physical, chart, labs and discussed the procedure including the risks, benefits and alternatives for the proposed anesthesia with the patient or authorized representative who has indicated his/her understanding and acceptance.      Dental advisory given  Plan Discussed with: CRNA and Anesthesiologist  Anesthesia Plan Comments:         Anesthesia Quick Evaluation

## 2023-03-30 NOTE — Interval H&P Note (Signed)
History and Physical Interval Note:  03/30/2023 8:26 AM  Alexandra Lamb  has presented today for surgery, with the diagnosis of LUNG NODULE-RIGHT.  The various methods of treatment have been discussed with the patient and family. After consideration of risks, benefits and other options for treatment, the patient has consented to  Procedure(s): ROBOTIC ASSISTED NAVIGATIONAL BRONCHOSCOPY WITH FLURO (Right) as a surgical intervention.  The patient's history has been reviewed, patient examined, no change in status, stable for surgery.  I have reviewed the patient's chart and labs.  Questions were answered to the patient's satisfaction.     Rachel Bo Nelson Julson

## 2023-03-30 NOTE — Progress Notes (Signed)
Patient's sister originally stated that patient had a right arm extremity restriction. When talking to Dr. Malen Gauze the patient's sister stated that the left arm is actually the restricted extremity. PIV was already started in the left arm per the sister's original statement. Ok to leave to IV in the left arm per Dr. Malen Gauze.

## 2023-03-30 NOTE — Anesthesia Procedure Notes (Signed)
Procedure Name: Intubation Date/Time: 03/30/2023 8:55 AM  Performed by: Pincus Large, CRNAPre-anesthesia Checklist: Patient identified, Emergency Drugs available, Suction available and Patient being monitored Patient Re-evaluated:Patient Re-evaluated prior to induction Oxygen Delivery Method: Circle System Utilized Preoxygenation: Pre-oxygenation with 100% oxygen Induction Type: IV induction Ventilation: Mask ventilation without difficulty Laryngoscope Size: Miller and 2 Grade View: Grade II Tube type: Oral Tube size: 8.0 mm Number of attempts: 1 Airway Equipment and Method: Stylet and Oral airway Placement Confirmation: ETT inserted through vocal cords under direct vision, positive ETCO2 and breath sounds checked- equal and bilateral Secured at: 22 cm Tube secured with: Tape Dental Injury: Teeth and Oropharynx as per pre-operative assessment

## 2023-03-30 NOTE — Op Note (Signed)
Video Bronchoscopy with Robotic Assisted Bronchoscopic Navigation   Date of Operation: 03/30/2023   Pre-op Diagnosis: Right upper lobe pulmonary nodule  Post-op Diagnosis: Right upper lobe pulmonary nodule  Surgeon: Josephine Igo, DO  Assistants: None   Anesthesia: General endotracheal anesthesia  Operation: Flexible video fiberoptic bronchoscopy with robotic assistance and biopsies.  Estimated Blood Loss: Minimal  Complications: None  Indications and History: Alexandra Lamb is a 77 y.o. female with history of lung nodule . The risks, benefits, complications, treatment options and expected outcomes were discussed with the patient.  The possibilities of pneumothorax, pneumonia, reaction to medication, pulmonary aspiration, perforation of a viscus, bleeding, failure to diagnose a condition and creating a complication requiring transfusion or operation were discussed with the patient who freely signed the consent.    Description of Procedure: The patient was seen in the Preoperative Area, was examined and was deemed appropriate to proceed.  The patient was taken to [facility] endoscopy room 3, identified as Alexandra Lamb and the procedure verified as Flexible Video Fiberoptic Bronchoscopy.  A Time Out was held and the above information confirmed.   Prior to the date of the procedure a high-resolution CT scan of the chest was performed. Utilizing ION software program a virtual tracheobronchial tree was generated to allow the creation of distinct navigation pathways to the patient's parenchymal abnormalities. After being taken to the operating room general anesthesia was initiated and the patient  was orally intubated. The video fiberoptic bronchoscope was introduced via the endotracheal tube and a general inspection was performed which showed normal right and left lung anatomy, aspiration of the bilateral mainstems was completed to remove any remaining secretions. Robotic catheter inserted into  patient's endotracheal tube.   Target #1 right upper lobe pulmonary nodule: The distinct navigation pathways prepared prior to this procedure were then utilized to navigate to patient's lesion identified on CT scan. The robotic catheter was secured into place and the vision probe was withdrawn.  We attempted multiple times for approximately 45 minutes to navigate the catheter into the right upper lobe.  We kept running into issues with the catheter buckling and not able to make the sharp term.  This predominantly related to her anatomy and the sharp apical cutoff into the right upper lobe.  We are usually able to overcome this by advancing tools and allowing the catheter to become stiffer on making the articulation however we were unable to overcome the catheter buckling.  The catheter rubbing up against the mucosa was starting to cause the area to bleed and become irritated.  Intraoperatively I called the patient's sister and to inform her that I felt that it was unnecessary to continue attempts at articulating the robotic catheter into the apex.  She understood and the procedure was aborted.  At the end of the procedure a general airway inspection was performed and there was no evidence of active bleeding. The bronchoscope was removed.  The patient tolerated the procedure well. There was no significant blood loss and there were no obvious complications. A post-procedural chest x-ray is pending.  Samples Target #1: 1. None   Plans:  The patient will be discharged from the PACU to home when recovered from anesthesia and after chest x-ray is reviewed. Outpatient followup will be with Kandice Robinsons, NP.   Josephine Igo, DO [facility] 03/30/2023 9:49 AM

## 2023-03-30 NOTE — Progress Notes (Signed)
Dr. Malen Gauze made aware that the patient did not take Atenolol 100 mg this morning. Verbal order received to give Atenolol 100 mg.

## 2023-04-07 ENCOUNTER — Encounter: Payer: Self-pay | Admitting: Acute Care

## 2023-04-07 ENCOUNTER — Telehealth (INDEPENDENT_AMBULATORY_CARE_PROVIDER_SITE_OTHER): Payer: Medicare Other | Admitting: Acute Care

## 2023-04-07 DIAGNOSIS — Z87891 Personal history of nicotine dependence: Secondary | ICD-10-CM

## 2023-04-07 DIAGNOSIS — Z9889 Other specified postprocedural states: Secondary | ICD-10-CM | POA: Diagnosis not present

## 2023-04-07 DIAGNOSIS — R911 Solitary pulmonary nodule: Secondary | ICD-10-CM

## 2023-04-07 DIAGNOSIS — R2681 Unsteadiness on feet: Secondary | ICD-10-CM | POA: Diagnosis not present

## 2023-04-07 DIAGNOSIS — J449 Chronic obstructive pulmonary disease, unspecified: Secondary | ICD-10-CM | POA: Diagnosis not present

## 2023-04-07 NOTE — Patient Instructions (Addendum)
It was good to talk with you today Based on what you are able to tell us today it sounds like Shirley has done well since her procedure. Please call and let us know if she has had any continued blood in her sputum, fever, discolored secretions, or adverse reaction to her anesthesia. As the by biopsy was nondiagnostic plan will be for referral to radiation oncology to treat the pulmonary nodule without tissue diagnosis. Dr. Tonia Brooms had already spoken with Dr. Kathrynn Running about this after he had attempted  bronchoscopy  with biopsies and was unsuccessful due to patient anatomy. I made the official referral today so you will get a call to get this appointment scheduled. Good luck with treatment moving forward. Please let us know if there is anything that we can do Please contact office for sooner follow up if symptoms do not improve or worsen or seek emergency care

## 2023-04-07 NOTE — Progress Notes (Signed)
Virtual Visit via Video Note  I connected with Alexandra Lamb on 04/07/23 at 10:30 AM EST by telephone and verified that I am speaking with the correct person using two identifiers.  Location: Patient: At home Provider: [address], [place], [place], [address]    I discussed the limitations, risks, security and privacy concerns of performing an evaluation and management service by telephone and the availability of in person appointments. I also discussed with the patient that there may be a patient responsible charge related to this service. The patient expressed understanding and agreed to proceed.   History of Present Illness: 77 year old female with history of breast cancer which was treated with a mastectomy in 2011, hypertension, diabetes, hypercholesterolemia, schizophrenia and a lung nodule referred to Dr. Tonia Lamb 1224 for bronchoscopy with biopsy by Dr. Arbutus Lamb.  Nodule was initially identified during a routine physical exam with a CT angiogram of the chest in March 2024.  A subsequent CT of the chest was done 02/21/2023 which confirmed the presence of a partially solid cystic nodule in the anterior apical part of the right upper lobe, which had increased in size from 1.2 cm to 1.5 cm over several months.  Patient has a significant smoking history having smoked heavily for a long period of time starting in college and quitting approximately 2009, which would be approximately 58-pack-year history.  There is family history of heart disease in her father and COPD in her mother.  Patient is a widow has no children and had previously worked in IT consultant.   Per her sister, the patient denied any chest pain, shortness of breath or cough.  However she does report a significant weight loss of approximately 20 pounds over an unspecified period of time.  CT and MRI of the brain showed suspicions meningiomas.  Today's video visit was attended by patient's sister Alexandra Lamb, who lives out of town.   Patient's sister Alexandra Lamb is her legal guardian.  The patient was not present at today's visit due to her underlying mental health issues.  Per patient sister Alexandra Lamb, the patient has not had any significant hemoptysis, fever, discolored secretions, or an adverse reaction to her anesthesia post bronchoscopy with biopsies.  We discussed that the biopsy that was done 03/30/2023 was non diagnostic due to patient's anatomy and inability to make a sharp apical cut off into the right upper lobe.  Dr. Tonia Lamb tried for 45 minutes and was unable to get tissue for evaluation.  Dr. Tonia Lamb had spoken to Dr. Kathrynn Lamb at radiation oncology about the patient based on her imaging and her past medical history of lung cancer Dr. Kathrynn Lamb has been kind enough to consider radiation to the right upper lobe mass for treatment without tissue sampling.  I have referred the patient to radiation oncology today, with the hope that she will get scheduled for consult soon.  The patient's brother-in-law is a Sports administrator and they had desired tissue sampling to know what this was however I discussed with patient's sister today if treatment would be the same regardless rather than put the patient through another biopsy attempt it made more sense to refer to radiation oncology in the best interest of the patient.  Patient's sister Alexandra Lamb was in agreement with the plan and had no further questions at completion of the call.  I have asked that she let us know if the patient has any fever or discolored secretions after the bronchoscopy so that we can make sure she received the appropriate treatment.  Observations/Objective: PET scan 03/12/2023 The 1.2 by 0.8 cm apical segment right upper lobe nodule has a maximum SUV of 3.7, suspicious for malignancy. No hypermetabolic adenopathy or additional significant hypermetabolic lesion identified. Bilateral benign adrenal adenomas. Mild chronic right maxillary sinusitis. Coronary, aortic arch, and branch  vessel atherosclerotic vascular disease. Mild cardiomegaly. Left mastectomy and left axillary dissection.   Aortic Atherosclerosis (ICD10-I70.0).  CT Chest with contrast 01/29/2023 Irregular predominantly solid and part cystic anterior apical right upper lobe 1.5 x 1.0 cm pulmonary nodule, slightly increased in size and increased in density (increased solid component and decreased cystic component) since 06/20/2022 chest CT angiogram study. Primary bronchogenic malignancy to be excluded. Multidisciplinary thoracic oncology consultation suggested. PET-CT may be considered. 2. No thoracic adenopathy. 3. Three-vessel coronary atherosclerosis. 4. Stable bilateral adrenal adenomas, for which no follow-up imaging is recommended. 5.  Aortic Atherosclerosis (ICD10-I70.0).  Assessment and Plan: Irregular predominantly solid and part cystic anterior apical right upper lobe 1.5 x 1.0 cm pulmonary nodule, slightly increased in size and increased in density (increased solid component and decreased cystic component) since 06/20/2022 chest . Bronchoscopy with biopsy attempt on 03/30/2023 was anatomically difficult specimen was not obtained for tissue sampling Dr. Tonia Lamb has spoken with Dr. Kathrynn Lamb with radiation oncology about treating this finding without tissue sampling based on suspicious imaging and patient's history of breast cancer. Plan Patient has been referred to radiation oncology You should be receiving a call to get consultation with radiation oncology scheduled. Please call and let us know if she has had any continued blood in her sputum, fever, discolored secretions, or adverse reaction to her anesthesia. Good luck with treatment moving forward. Please let us know if there is anything that we can do Please contact office for sooner follow up if symptoms do not improve or worsen or seek emergency care    Follow Up Instructions: Follow-up with radiation oncology once have been  scheduled. Please call if you have any questions or concerns that we can help address. Please contact office for sooner follow up if symptoms do not improve or worsen or seek emergency care     I discussed the assessment and treatment plan with the patient. The patient was provided an opportunity to ask questions and all were answered. The patient agreed with the plan and demonstrated an understanding of the instructions.   The patient was advised to call back or seek an in-person evaluation if the symptoms worsen or if the condition fails to improve as anticipated.  I provided 25 minutes of face-to-face (video visit)  time during this encounter.  Call was initiated as a video visit however patient sister was having issues with her microphone and it was completed as a telephone visit   Bevelyn Ngo, NP  04/07/2023

## 2023-04-08 DIAGNOSIS — Z4802 Encounter for removal of sutures: Secondary | ICD-10-CM | POA: Diagnosis not present

## 2023-04-08 DIAGNOSIS — S0100XA Unspecified open wound of scalp, initial encounter: Secondary | ICD-10-CM | POA: Diagnosis not present

## 2023-04-09 DIAGNOSIS — R2681 Unsteadiness on feet: Secondary | ICD-10-CM | POA: Diagnosis not present

## 2023-04-09 DIAGNOSIS — J449 Chronic obstructive pulmonary disease, unspecified: Secondary | ICD-10-CM | POA: Diagnosis not present

## 2023-04-12 DIAGNOSIS — R2681 Unsteadiness on feet: Secondary | ICD-10-CM | POA: Diagnosis not present

## 2023-04-12 DIAGNOSIS — J449 Chronic obstructive pulmonary disease, unspecified: Secondary | ICD-10-CM | POA: Diagnosis not present

## 2023-04-15 NOTE — Progress Notes (Signed)
Thoracic Location of Tumor / Histology: Right Lung (upper lobe) Pulmonary Nodule  03/30/2023 Dr. Elige Radon Icard DG Chest Port 1 View CLINICAL DATA: Status post right bronchoscopy.   IMPRESSION: 1. No acute findings. No evidence of a pneumothorax or other complication following bronchoscopy.   03/29/2023 Dr. Linwood Dibbles CT Head without Contrast CLINICAL DATA: Fall, posterior head injury    IMPRESSION: Moderate extracranial hematoma overlying the left parietal vertex. No evidence of calvarial fracture.  No acute intracranial abnormality. Mild small vessel ischemic changes. 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  No traumatic injury to the cervical spine, noting motion degradation.  03/12/2023 Dr. Arbutus Ped NM PET Image Initial (PI) Skull Base to Thigh CLINICAL DATA:  Initial treatment strategy for right upper lobe lung nodule.  IMPRESSION: 1. The 1.2 by 0.8 cm apical segment right upper lobe nodule has a maximum SUV of 3.7, suspicious for malignancy. 2. No hypermetabolic adenopathy or additional significant hypermetabolic lesion identified. 3. Bilateral benign adrenal adenomas. 4. Mild chronic right maxillary sinusitis. 5. Coronary, aortic arch, and branch vessel atherosclerotic vascular disease. Mild cardiomegaly. 6. Left mastectomy and left axillary dissection. Aortic Atherosclerosis (ICD10-I70.0).   01/29/2023 Dr. Laurann Montana CT Chest without Contrast CLINICAL DATA:  Follow-up apical right upper lobe pulmonary nodule.  Former smoker. History of left breast cancer. * Tracking Code: BO *  IMPRESSION: 1. Irregular predominantly solid and part cystic anterior apical right upper lobe 1.5 x 1.0 cm pulmonary nodule, slightly increased in size and increased in density (increased solid component and decreased cystic component) since 06/20/2022 chest CT angiogram study. Primary bronchogenic malignancy to be excluded. Multidisciplinary thoracic oncology  consultation suggested. PET-CT may be considered. 2. No thoracic adenopathy. 3. Three-vessel coronary atherosclerosis. 4. Stable bilateral adrenal adenomas, for which no follow-up imaging is recommended. 5.  Aortic Atherosclerosis (ICD10-I70.0).   Biopsy: 01/29/2023 Dr. Elige Radon Icard Video Bronchoscopy with Robotic Assisted Bronchoscopic Navigation of the right upper lobe pulmonary nodule  Past/Anticipated interventions by cardiothoracic surgery, if any:  04/07/2023 Kandice Robinsons, NP    Past/Anticipated interventions by medical oncology, if any:   03/10/2023 Dr. Arbutus Ped   Tobacco/Marijuana/Snuff/ETOH use: Former smoker, no drug snuff, or alcohol use.  Signs/Symptoms Weight changes, if any: Weight loss 20 lbs . Intended due to medication for diabetes. Respiratory complaints, if any: No, COPD Hemoptysis, if any: No Pain issues, if any: 0/10   SAFETY ISSUES: Prior radiation? No Pacemaker/ICD?  No Possible current pregnancy? Postmenopausal Is the patient on methotrexate? No  Current Complaints / other details:  Patient lives in assisted living home they will take care of medications no pharmacy list in chart at this time per guardians

## 2023-04-18 NOTE — Progress Notes (Signed)
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 Malignant neoplasm of right upper lobe of lung (HCC)  C34.11       HISTORY OF PRESENT ILLNESS: Alexandra Lamb is a 77 y.o. female seen at the request of Dr. Tonia Brooms. She has a history of left breast cancer, s/p left mastectomy in 2011. More recently, she was noted to have a right apex lung nodule during a hospital admission in 06/2022. She returned for a follow up chest CT on 01/29/23 showing: 1.5 cm apical RUL pulmonary nodule, slightly increased in size and density since 06/2022; no thoracic adenopathy. She was referred to Dr. Arbutus Ped on 03/10/23 for further evaluation. The recommendation was to proceed with PET scan and referral to pulmonology. She underwent PET scan on 03/12/23 showing: 1.2 cm apical RUL nodule has maximum SUV of 3.7, suspicious for malignancy; no hypermetabolic adenopathy or additional significant hypermetabolic lesion identified.  She was referred to Dr. Tonia Brooms in pulmonology, who recommended proceeding with bronchoscopy. Unfortunately, during the procedure on 03/30/23, Dr. Tonia Brooms was unable to obtain a biopsy sample due to patient's anatomy and inability to make a sharp apical cut off the RUL.  She presents today with two of her four siblings and one he is her guardian.  PREVIOUS RADIATION THERAPY: No  PAST MEDICAL HISTORY:  Past Medical History:  Diagnosis Date   Cancer (HCC)    left breast   Diabetes mellitus    Hyperlipidemia    Hypertension    MCI (mild cognitive impairment)    Schizophrenia (HCC)    paranoid      PAST SURGICAL HISTORY: Past Surgical  History:  Procedure Laterality Date   BREAST SURGERY  2011   left breast mast   MASTECTOMY     left   TOOTH EXTRACTION      FAMILY HISTORY:  Family History  Problem Relation Age of Onset   Cancer Maternal Aunt        brain tumor   Dementia Neg Hx     SOCIAL HISTORY:  Social History   Socioeconomic History   Marital status: Widowed    Spouse name: Not on file   Number of children: Not on file   Years of education: Not on file   Highest education level: Not on file  Occupational History   Not on file  Tobacco Use   Smoking status: Former    Current packs/day: 0.00    Types: Cigarettes    Quit date: 04/21/2007    Years since quitting: 16.0   Smokeless tobacco: Never  Vaping Use   Vaping status: Never Used  Substance and Sexual Activity   Alcohol use: No   Drug use: No   Sexual activity: Not Currently  Other Topics Concern   Not on file  Social History Narrative   Resides at [employer]   Social Drivers of Health   Financial Resource Strain: Not on file  Food Insecurity: No Food Insecurity (04/19/2023)   Hunger Vital Sign    Worried About Running Out of Food in the Last Year: Never true  Ran Out of Food in the Last Year: Never true  Transportation Needs: No Transportation Needs (04/19/2023)   PRAPARE - Administrator, Civil Service (Medical): No    Lack of Transportation (Non-Medical): No  Physical Activity: Not on file  Stress: Not on file  Social Connections: Not on file  Intimate Partner Violence: Not At Risk (04/19/2023)   Humiliation, Afraid, Rape, and Kick questionnaire    Fear of Current or Ex-Partner: No    Emotionally Abused: No    Physically Abused: No    Sexually Abused: No    ALLERGIES: Metformin  MEDICATIONS:  Current Outpatient Medications  Medication Sig Dispense Refill   acetaminophen (TYLENOL) 500 MG tablet Take 500 mg by mouth every 6 (six) hours as needed.       amLODipine (NORVASC) 2.5 MG tablet Take 2.5 mg by  mouth daily.     amoxicillin (AMOXIL) 500 MG capsule Take 1 capsule (500 mg total) by mouth 3 (three) times daily for 4 days (Patient not taking: Reported on 03/29/2023) 12 capsule 0   atenolol (TENORMIN) 100 MG tablet Take 100 mg by mouth daily.     Cholecalciferol (RA VITAMIN D-3) 2000 units CAPS Take 1 capsule (2,000 Units total) by mouth daily. 90 capsule 3   divalproex (DEPAKOTE ER) 500 MG 24 hr tablet 1,500 mg at bedtime.     Dulaglutide (TRULICITY) 4.5 MG/0.5ML SOPN Inject 0.5 mLs into the skin every 7 (seven) days.     latanoprost (XALATAN) 0.005 % ophthalmic solution Place 1 drop into both eyes at bedtime.     levothyroxine (SYNTHROID) 112 MCG tablet Take 112 mcg by mouth daily before breakfast.     memantine (NAMENDA) 10 MG tablet Take 10 mg by mouth 2 (two) times daily.     metFORMIN (GLUCOPHAGE-XR) 500 MG 24 hr tablet Take 500 mg by mouth daily.     Multiple Vitamins-Minerals (CENTRUM ULTRA WOMENS) TABS as directed Orally once a day for 100 days     OLANZapine (ZYPREXA) 15 MG tablet Take 15 mg by mouth at bedtime.     simvastatin (ZOCOR) 20 MG tablet Take 20 mg by mouth daily.     valsartan-hydrochlorothiazide (DIOVAN-HCT) 320-12.5 MG tablet Take 1 tablet by mouth daily.     No current facility-administered medications for this encounter.    REVIEW OF SYSTEMS:  On review of systems, the patient reports that she is doing well overall. She denies any chest pain, shortness of breath, cough, fevers, chills, night sweats, unintended weight changes. She denies any bowel or bladder disturbances, and denies abdominal pain, nausea or vomiting. She denies any new musculoskeletal or joint aches or pains.  A complete review of systems is obtained and is otherwise negative.    PHYSICAL EXAM:  Wt Readings from Last 3 Encounters:  04/19/23 143 lb (64.9 kg)  03/30/23 138 lb 14.2 oz (63 kg)  03/29/23 138 lb 14.2 oz (63 kg)   Temp Readings from Last 3 Encounters:  04/19/23 98 F (36.7 C) (Oral)   03/30/23 97.6 F (36.4 C)  03/30/23 (!) 97.4 F (36.3 C) (Oral)   BP Readings from Last 3 Encounters:  04/19/23 (!) 159/69  03/30/23 (!) 148/67  03/30/23 (!) 189/76   Pulse Readings from Last 3 Encounters:  04/19/23 80  03/30/23 78  03/30/23 89   Pain Assessment Pain Score: 0-No pain/10  In general this is a well appearing woman in no acute distress. She's alert and oriented x4 and appropriate throughout  the examination. Cardiopulmonary assessment is negative for acute distress and she exhibits normal effort.     KPS = 100  100 - Normal; no complaints; no evidence of disease. 90   - Able to carry on normal activity; minor signs or symptoms of disease. 80   - Normal activity with effort; some signs or symptoms of disease. 38   - Cares for self; unable to carry on normal activity or to do active work. 60   - Requires occasional assistance, but is able to care for most of his personal needs. 50   - Requires considerable assistance and frequent medical care. 40   - Disabled; requires special care and assistance. 30   - Severely disabled; hospital admission is indicated although death not imminent. 20   - Very sick; hospital admission necessary; active supportive treatment necessary. 10   - Moribund; fatal processes progressing rapidly. 0     - Dead  Karnofsky DA, Abelmann WH, Craver LS and Burchenal JH [phone]) The use of the nitrogen mustards in the palliative treatment of carcinoma: with particular reference to bronchogenic carcinoma Cancer 1 634-56  LABORATORY DATA:  Lab Results  Component Value Date   WBC 8.8 03/29/2023   HGB 13.4 03/29/2023   HCT 39.4 03/29/2023   MCV 84.4 03/29/2023   PLT 187 03/29/2023   Lab Results  Component Value Date   NA 136 03/29/2023   K 3.4 (L) 03/29/2023   CL 98 03/29/2023   CO2 26 03/29/2023   Lab Results  Component Value Date   ALT 10 03/10/2023   AST 14 (L) 03/10/2023   ALKPHOS 61 03/10/2023   BILITOT 0.4 03/10/2023      RADIOGRAPHY: DG Chest Port 1 View Result Date: 03/30/2023 CLINICAL DATA:  Status post right bronchoscopy. EXAM: PORTABLE CHEST 1 VIEW COMPARISON:  06/20/2022 and older exams.  CT, 01/29/2023. FINDINGS: Cardiac silhouette is normal in size. No mediastinal or hilar masses. Irregular focal opacity at the right apex is unchanged. No lung consolidation or edema. No pleural effusion.  No pneumothorax. Skeletal structures are demineralized, grossly intact. Stable changes from a prior left mastectomy. IMPRESSION: 1. No acute findings. No evidence of a pneumothorax or other complication following bronchoscopy. Electronically Signed   By: Amie Portland M.D.   On: 03/30/2023 11:17   DG C-Arm 1-60 Min-No Report Result Date: 03/30/2023 Fluoroscopy was utilized by the requesting physician.  No radiographic interpretation.   CT Head Wo Contrast Result Date: 03/29/2023 CLINICAL DATA:  Fall, posterior head injury EXAM: CT HEAD WITHOUT CONTRAST CT CERVICAL SPINE WITHOUT CONTRAST TECHNIQUE: Multidetector CT imaging of the head and cervical spine was performed following the standard protocol without intravenous contrast. Multiplanar CT image reconstructions of the cervical spine were also generated. RADIATION DOSE REDUCTION: This exam was performed according to the departmental dose-optimization program which includes automated exposure control, adjustment of the mA and/or kV according to patient size and/or use of iterative reconstruction technique. COMPARISON:  MRI brain dated 06/21/2022 FINDINGS: CT HEAD FINDINGS Brain: No evidence of acute infarction, hemorrhage, hydrocephalus, extra-axial collection or mass lesion/mass effect. Stable 10 mm meningioma overlying the left cerebellum (series 3/image 9). Mild subcortical white matter and periventricular small vessel ischemic changes. Vascular: Intracranial atherosclerosis. Skull: Normal. Negative for fracture or focal lesion. Sinuses/Orbits: Partial opacification of the  right frontal, bilateral ethmoid, bilateral maxillary, and left sphenoid sinuses. Mastoid air cells are clear. Other: Moderate extracranial hematoma overlying the left parietal vertex (series 8/image 23). CT CERVICAL SPINE FINDINGS Motion  degraded images. Alignment: Normal cervical lordosis. Skull base and vertebrae: No acute fracture. No primary bone lesion or focal pathologic process. Soft tissues and spinal canal: No prevertebral fluid or swelling. No visible canal hematoma. Disc levels: Mild degenerative changes at C5-6. Spinal canal is patent. Upper chest: Visualized lung apices are clear. Other: Visualized thyroid is grossly unremarkable. IMPRESSION: Moderate extracranial hematoma overlying the left parietal vertex. No evidence of calvarial fracture. No acute intracranial abnormality. Mild small vessel ischemic changes. 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 No traumatic injury to the cervical spine, noting motion degradation. Electronically Signed   By: Charline Bills M.D.   On: 03/29/2023 23:23   CT Cervical Spine Wo Contrast Result Date: 03/29/2023 CLINICAL DATA:  Fall, posterior head injury EXAM: CT HEAD WITHOUT CONTRAST CT CERVICAL SPINE WITHOUT CONTRAST TECHNIQUE: Multidetector CT imaging of the head and cervical spine was performed following the standard protocol without intravenous contrast. Multiplanar CT image reconstructions of the cervical spine were also generated. RADIATION DOSE REDUCTION: This exam was performed according to the departmental dose-optimization program which includes automated exposure control, adjustment of the mA and/or kV according to patient size and/or use of iterative reconstruction technique. COMPARISON:  MRI brain dated 06/21/2022 FINDINGS: CT HEAD FINDINGS Brain: No evidence of acute infarction, hemorrhage, hydrocephalus, extra-axial collection or mass lesion/mass effect. Stable 10 mm meningioma overlying the left cerebellum (series 3/image  9). Mild subcortical white matter and periventricular small vessel ischemic changes. Vascular: Intracranial atherosclerosis. Skull: Normal. Negative for fracture or focal lesion. Sinuses/Orbits: Partial opacification of the right frontal, bilateral ethmoid, bilateral maxillary, and left sphenoid sinuses. Mastoid air cells are clear. Other: Moderate extracranial hematoma overlying the left parietal vertex (series 8/image 23). CT CERVICAL SPINE FINDINGS Motion degraded images. Alignment: Normal cervical lordosis. Skull base and vertebrae: No acute fracture. No primary bone lesion or focal pathologic process. Soft tissues and spinal canal: No prevertebral fluid or swelling. No visible canal hematoma. Disc levels: Mild degenerative changes at C5-6. Spinal canal is patent. Upper chest: Visualized lung apices are clear. Other: Visualized thyroid is grossly unremarkable. IMPRESSION: Moderate extracranial hematoma overlying the left parietal vertex. No evidence of calvarial fracture. No acute intracranial abnormality. Mild small vessel ischemic changes. 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 No traumatic injury to the cervical spine, noting motion degradation. Electronically Signed   By: Charline Bills M.D.   On: 03/29/2023 23:23      IMPRESSION/PLAN: 1. 77 y.o. woman with PET-positive apical RUL pulmonary nodule.  It likely represents an early stage non-small cell carcinoma not amenable to attempted navigational robotic bronchoscopy.  The cavitary nature of the small lesion may also limit amenability to percutaneous biopsy  Today, we talked to the patient and family about the findings and workup thus far. We discussed the natural history of lung cancer and general treatment, highlighting the role of radiotherapy in the management. We discussed the available radiation techniques, and focused on the details and logistics of delivery. We reviewed the anticipated acute and late sequelae  associated with radiation in this setting. The patient was encouraged to ask questions that were answered to her satisfaction.   At the end of our conversation, the patient and her family will consider empiric SBRT in 3 fractions with curative intent.   I personally spent 60 minutes in this encounter including chart review, reviewing radiological studies, meeting face-to-face with the patient, entering orders and completing documentation.   ------------------------------------------------   Margaretmary Dys, MD [facility]  Radiation Oncology Direct Dial: [phone]  Fax: [phone] [email or web address]  Skype  LinkedIn   This document serves as a record of services personally performed by Margaretmary Dys, MD. It was created on his behalf by Mickie Bail, a trained medical scribe. The creation of this record is based on the scribe's personal observations and the provider's statements to them. This document has been checked and approved by the attending provider.

## 2023-04-19 ENCOUNTER — Ambulatory Visit
Admission: RE | Admit: 2023-04-19 | Discharge: 2023-04-19 | Disposition: A | Discharge status: Home / Self Care | Payer: Medicare Other | Source: Ambulatory Visit | Attending: Radiation Oncology | Admitting: Radiation Oncology

## 2023-04-19 ENCOUNTER — Encounter: Payer: Self-pay | Admitting: Radiation Oncology

## 2023-04-19 VITALS — BP 159/69 | HR 80 | Temp 98.0°F | Resp 18 | Ht 67.0 in | Wt 143.0 lb

## 2023-04-19 DIAGNOSIS — Z79899 Other long term (current) drug therapy: Secondary | ICD-10-CM | POA: Diagnosis not present

## 2023-04-19 DIAGNOSIS — Z87891 Personal history of nicotine dependence: Secondary | ICD-10-CM | POA: Insufficient documentation

## 2023-04-19 DIAGNOSIS — C3411 Malignant neoplasm of upper lobe, right bronchus or lung: Secondary | ICD-10-CM | POA: Diagnosis not present

## 2023-04-19 DIAGNOSIS — R2681 Unsteadiness on feet: Secondary | ICD-10-CM | POA: Diagnosis not present

## 2023-04-19 DIAGNOSIS — J449 Chronic obstructive pulmonary disease, unspecified: Secondary | ICD-10-CM | POA: Diagnosis not present

## 2023-04-19 DIAGNOSIS — F200 Paranoid schizophrenia: Secondary | ICD-10-CM | POA: Insufficient documentation

## 2023-04-19 DIAGNOSIS — Z9012 Acquired absence of left breast and nipple: Secondary | ICD-10-CM | POA: Insufficient documentation

## 2023-04-19 DIAGNOSIS — M47812 Spondylosis without myelopathy or radiculopathy, cervical region: Secondary | ICD-10-CM | POA: Diagnosis not present

## 2023-04-19 DIAGNOSIS — I672 Cerebral atherosclerosis: Secondary | ICD-10-CM | POA: Diagnosis not present

## 2023-04-19 DIAGNOSIS — Z7984 Long term (current) use of oral hypoglycemic drugs: Secondary | ICD-10-CM | POA: Diagnosis not present

## 2023-04-19 DIAGNOSIS — D320 Benign neoplasm of cerebral meninges: Secondary | ICD-10-CM | POA: Diagnosis not present

## 2023-04-19 DIAGNOSIS — E785 Hyperlipidemia, unspecified: Secondary | ICD-10-CM | POA: Diagnosis not present

## 2023-04-19 DIAGNOSIS — Z7985 Long-term (current) use of injectable non-insulin antidiabetic drugs: Secondary | ICD-10-CM | POA: Diagnosis not present

## 2023-04-19 DIAGNOSIS — E119 Type 2 diabetes mellitus without complications: Secondary | ICD-10-CM | POA: Insufficient documentation

## 2023-04-19 DIAGNOSIS — I10 Essential (primary) hypertension: Secondary | ICD-10-CM | POA: Insufficient documentation

## 2023-04-19 DIAGNOSIS — C7951 Secondary malignant neoplasm of bone: Secondary | ICD-10-CM | POA: Diagnosis not present

## 2023-04-19 DIAGNOSIS — I6782 Cerebral ischemia: Secondary | ICD-10-CM | POA: Insufficient documentation

## 2023-04-19 DIAGNOSIS — Z79890 Hormone replacement therapy (postmenopausal): Secondary | ICD-10-CM | POA: Diagnosis not present

## 2023-04-19 DIAGNOSIS — Z853 Personal history of malignant neoplasm of breast: Secondary | ICD-10-CM | POA: Diagnosis not present

## 2023-04-23 ENCOUNTER — Telehealth: Payer: Self-pay | Admitting: Radiation Oncology

## 2023-04-23 DIAGNOSIS — R2681 Unsteadiness on feet: Secondary | ICD-10-CM | POA: Diagnosis not present

## 2023-04-23 DIAGNOSIS — J449 Chronic obstructive pulmonary disease, unspecified: Secondary | ICD-10-CM | POA: Diagnosis not present

## 2023-04-23 NOTE — Telephone Encounter (Signed)
 Pt's sister [name] called stating she was following up on a call made yesterday about pt's care. No note found in pt's chart related to this call. Sister states: family and pt have decided to not have biopsy done and instead want to proceed with radiation treatment. Caregiver was advised that I would send word to Dr. Alline team of this and would f/u to make sure next steps are taken to minimize further delay in pt care. Sister Jenkins able to verify she will be making appts on pt's behalf and best contact number [phone]. She will be travelling down from [place] to provide care and transportation.

## 2023-04-26 ENCOUNTER — Telehealth: Payer: Self-pay | Admitting: *Deleted

## 2023-04-26 DIAGNOSIS — J449 Chronic obstructive pulmonary disease, unspecified: Secondary | ICD-10-CM | POA: Diagnosis not present

## 2023-04-26 DIAGNOSIS — R2681 Unsteadiness on feet: Secondary | ICD-10-CM | POA: Diagnosis not present

## 2023-04-26 NOTE — Telephone Encounter (Signed)
 RETURNED PATIENT'S PHONE CALL, LVM FOR A RETURN CALL

## 2023-04-28 DIAGNOSIS — R2681 Unsteadiness on feet: Secondary | ICD-10-CM | POA: Diagnosis not present

## 2023-04-28 DIAGNOSIS — J449 Chronic obstructive pulmonary disease, unspecified: Secondary | ICD-10-CM | POA: Diagnosis not present

## 2023-04-29 DIAGNOSIS — Z87891 Personal history of nicotine dependence: Secondary | ICD-10-CM | POA: Diagnosis not present

## 2023-04-29 DIAGNOSIS — C3411 Malignant neoplasm of upper lobe, right bronchus or lung: Secondary | ICD-10-CM | POA: Diagnosis not present

## 2023-04-29 NOTE — Progress Notes (Signed)
  Radiation Oncology         [phone] ________________________________  Name: Alexandra Lamb MRN: [id number]  Date: 04/30/2023  DOB: 1946-04-13  STEREOTACTIC BODY RADIOTHERAPY SIMULATION AND TREATMENT PLANNING NOTE    ICD-10-CM   1. 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  Identity was confirmed.  All relevant records and images related to the planned course of therapy were reviewed.  The patient freely provided informed written consent to proceed with treatment after reviewing the details related to the planned course of therapy. The consent form was witnessed and verified by the simulation staff.  Then, the patient was set-up in a stable reproducible  supine position for radiation therapy.  A BodyFix immobilization pillow was fabricated for reproducible positioning.  Then I personally applied the abdominal compression paddle to limit respiratory excursion.  4D respiratoy motion management CT images were obtained.  Surface markings were placed.  The CT images were loaded into the planning software.  Then, using Cine, MIP, and standard views, the internal target volume (ITV) and planning target volumes (PTV) were delinieated, and avoidance structures were contoured.  Treatment planning then occurred.  The radiation prescription was entered and confirmed.  A total of two complex treatment devices were fabricated in the form of the BodyFix immobilization pillow and a neck accuform cushion.  I have requested : 3D Simulation  I have requested a DVH of the following structures: Heart, Lungs, Esophagus, Chest Wall, Brachial Plexus, Major Blood Vessels, and targets.  SPECIAL TREATMENT PROCEDURE:  The planned course of therapy using radiation constitutes a special treatment procedure. Special care is  required in the management of this patient for the following reasons. This treatment constitutes a Special Treatment Procedure for the following reason: [ High dose per fraction requiring special monitoring for increased toxicities of treatment including daily imaging..  The special nature of the planned course of radiotherapy will require increased physician supervision and oversight to ensure patient's safety with optimal treatment outcomes.  This requires extended time and effort.    RESPIRATORY MOTION MANAGEMENT SIMULATION:  In order to account for effect of respiratory motion on target structures and other organs in the planning and delivery of radiotherapy, this patient underwent respiratory motion management simulation.  To accomplish this, when the patient was brought to the CT simulation planning suite, 4D respiratoy motion management CT images were obtained.  The CT images were loaded into the planning software.  Then, using a variety of tools including Cine, MIP, and standard views, the target volume and planning target volumes (PTV) were delineated.  Avoidance structures were contoured.  Treatment planning then occurred.  Dose volume histograms were generated and reviewed for each of the requested structure.  The resulting plan was carefully reviewed and approved today.  PLAN:  The patient will receive 54 Gy in 3 fractions.  ________________________________  Donnice FELIX Patrcia, M.D.

## 2023-04-30 ENCOUNTER — Ambulatory Visit
Admission: RE | Admit: 2023-04-30 | Discharge: 2023-04-30 | Disposition: A | Discharge status: Home / Self Care | Payer: Medicare Other | Source: Ambulatory Visit | Attending: Radiation Oncology | Admitting: Radiation Oncology

## 2023-04-30 ENCOUNTER — Other Ambulatory Visit: Payer: Self-pay

## 2023-04-30 DIAGNOSIS — C3411 Malignant neoplasm of upper lobe, right bronchus or lung: Secondary | ICD-10-CM | POA: Insufficient documentation

## 2023-04-30 DIAGNOSIS — Z510 Encounter for antineoplastic radiation therapy: Secondary | ICD-10-CM | POA: Insufficient documentation

## 2023-04-30 DIAGNOSIS — Z87891 Personal history of nicotine dependence: Secondary | ICD-10-CM | POA: Diagnosis not present

## 2023-05-03 DIAGNOSIS — J449 Chronic obstructive pulmonary disease, unspecified: Secondary | ICD-10-CM | POA: Diagnosis not present

## 2023-05-03 DIAGNOSIS — R2681 Unsteadiness on feet: Secondary | ICD-10-CM | POA: Diagnosis not present

## 2023-05-05 DIAGNOSIS — R2681 Unsteadiness on feet: Secondary | ICD-10-CM | POA: Diagnosis not present

## 2023-05-05 DIAGNOSIS — J449 Chronic obstructive pulmonary disease, unspecified: Secondary | ICD-10-CM | POA: Diagnosis not present

## 2023-05-07 DIAGNOSIS — C3411 Malignant neoplasm of upper lobe, right bronchus or lung: Secondary | ICD-10-CM | POA: Diagnosis not present

## 2023-05-07 DIAGNOSIS — Z87891 Personal history of nicotine dependence: Secondary | ICD-10-CM | POA: Diagnosis not present

## 2023-05-07 DIAGNOSIS — R2681 Unsteadiness on feet: Secondary | ICD-10-CM | POA: Diagnosis not present

## 2023-05-07 DIAGNOSIS — J449 Chronic obstructive pulmonary disease, unspecified: Secondary | ICD-10-CM | POA: Diagnosis not present

## 2023-05-07 DIAGNOSIS — Z510 Encounter for antineoplastic radiation therapy: Secondary | ICD-10-CM | POA: Diagnosis not present

## 2023-05-10 DIAGNOSIS — J449 Chronic obstructive pulmonary disease, unspecified: Secondary | ICD-10-CM | POA: Diagnosis not present

## 2023-05-10 DIAGNOSIS — R2681 Unsteadiness on feet: Secondary | ICD-10-CM | POA: Diagnosis not present

## 2023-05-11 ENCOUNTER — Ambulatory Visit: Payer: Medicare Other | Admitting: Radiation Oncology

## 2023-05-12 ENCOUNTER — Ambulatory Visit: Payer: Medicare Other | Admitting: Radiation Oncology

## 2023-05-12 DIAGNOSIS — R2681 Unsteadiness on feet: Secondary | ICD-10-CM | POA: Diagnosis not present

## 2023-05-12 DIAGNOSIS — J449 Chronic obstructive pulmonary disease, unspecified: Secondary | ICD-10-CM | POA: Diagnosis not present

## 2023-05-13 ENCOUNTER — Other Ambulatory Visit: Payer: Self-pay

## 2023-05-13 ENCOUNTER — Ambulatory Visit
Admission: RE | Admit: 2023-05-13 | Discharge: 2023-05-13 | Disposition: A | Discharge status: Home / Self Care | Payer: Medicare Other | Source: Ambulatory Visit | Attending: Radiation Oncology | Admitting: Radiation Oncology

## 2023-05-13 DIAGNOSIS — Z510 Encounter for antineoplastic radiation therapy: Secondary | ICD-10-CM | POA: Diagnosis not present

## 2023-05-13 DIAGNOSIS — C3411 Malignant neoplasm of upper lobe, right bronchus or lung: Secondary | ICD-10-CM | POA: Diagnosis not present

## 2023-05-13 LAB — RAD ONC ARIA SESSION SUMMARY
Course Elapsed Days: 0
Plan Fractions Treated to Date: 1
Plan Prescribed Dose Per Fraction: 18 Gy
Plan Total Fractions Prescribed: 3
Plan Total Prescribed Dose: 54 Gy
Reference Point Dosage Given to Date: 18 Gy
Reference Point Session Dosage Given: 18 Gy
Session Number: 1

## 2023-05-14 ENCOUNTER — Ambulatory Visit: Payer: Medicare Other | Admitting: Radiation Oncology

## 2023-05-17 ENCOUNTER — Other Ambulatory Visit: Payer: Self-pay

## 2023-05-17 ENCOUNTER — Ambulatory Visit
Admission: RE | Admit: 2023-05-17 | Discharge: 2023-05-17 | Disposition: A | Discharge status: Home / Self Care | Payer: Medicare Other | Source: Ambulatory Visit | Attending: Radiation Oncology | Admitting: Radiation Oncology

## 2023-05-17 DIAGNOSIS — C3411 Malignant neoplasm of upper lobe, right bronchus or lung: Secondary | ICD-10-CM | POA: Diagnosis not present

## 2023-05-17 DIAGNOSIS — Z510 Encounter for antineoplastic radiation therapy: Secondary | ICD-10-CM | POA: Diagnosis not present

## 2023-05-17 LAB — RAD ONC ARIA SESSION SUMMARY
Course Elapsed Days: 4
Plan Fractions Treated to Date: 2
Plan Prescribed Dose Per Fraction: 18 Gy
Plan Total Fractions Prescribed: 3
Plan Total Prescribed Dose: 54 Gy
Reference Point Dosage Given to Date: 36 Gy
Reference Point Session Dosage Given: 18 Gy
Session Number: 2

## 2023-05-18 DIAGNOSIS — J449 Chronic obstructive pulmonary disease, unspecified: Secondary | ICD-10-CM | POA: Diagnosis not present

## 2023-05-18 DIAGNOSIS — R2681 Unsteadiness on feet: Secondary | ICD-10-CM | POA: Diagnosis not present

## 2023-05-19 ENCOUNTER — Ambulatory Visit: Payer: Medicare Other

## 2023-05-19 ENCOUNTER — Ambulatory Visit
Admission: RE | Admit: 2023-05-19 | Discharge: 2023-05-19 | Disposition: A | Discharge status: Home / Self Care | Payer: Medicare Other | Source: Ambulatory Visit | Attending: Radiation Oncology | Admitting: Radiation Oncology

## 2023-05-19 ENCOUNTER — Other Ambulatory Visit: Payer: Self-pay

## 2023-05-19 ENCOUNTER — Ambulatory Visit: Payer: Medicare Other | Admitting: Radiation Oncology

## 2023-05-19 DIAGNOSIS — Z87891 Personal history of nicotine dependence: Secondary | ICD-10-CM | POA: Diagnosis not present

## 2023-05-19 DIAGNOSIS — Z510 Encounter for antineoplastic radiation therapy: Secondary | ICD-10-CM | POA: Diagnosis not present

## 2023-05-19 DIAGNOSIS — C3411 Malignant neoplasm of upper lobe, right bronchus or lung: Secondary | ICD-10-CM

## 2023-05-19 LAB — RAD ONC ARIA SESSION SUMMARY
Course Elapsed Days: 6
Plan Fractions Treated to Date: 3
Plan Prescribed Dose Per Fraction: 18 Gy
Plan Total Fractions Prescribed: 3
Plan Total Prescribed Dose: 54 Gy
Reference Point Dosage Given to Date: 54 Gy
Reference Point Session Dosage Given: 18 Gy
Session Number: 3

## 2023-05-20 ENCOUNTER — Telehealth: Payer: Self-pay | Admitting: *Deleted

## 2023-05-20 DIAGNOSIS — R2681 Unsteadiness on feet: Secondary | ICD-10-CM | POA: Diagnosis not present

## 2023-05-20 DIAGNOSIS — J449 Chronic obstructive pulmonary disease, unspecified: Secondary | ICD-10-CM | POA: Diagnosis not present

## 2023-05-20 NOTE — Telephone Encounter (Signed)
RETURNED PATIENT'S SISTER'S PHONE CALL ([name] CARTER), UNABLE TO LVM, NO VM

## 2023-05-20 NOTE — Radiation Completion Notes (Addendum)
  Radiation Oncology         [phone] ________________________________  Name: Alexandra Lamb MRN: [id number]  Date: 05/20/2023  DOB: March 04, 1946  Referring Physician: Audie Box, M.D. Date of Service: 2023-05-20 Radiation Oncologist: Margaretmary Bayley, M.D. 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  The patient will receive a call in about one month from the radiation oncology department.  We will arrange for a posttreatment CT chest scan in April 2025 and a telephone follow-up visit thereafter to review the results.  She will continue follow up with [facility] pulmonology as well.  ------------------------------------------------   Margaretmary Dys, MD [facility]  Radiation Oncology Direct Dial: [phone]  Fax: [phone] [email or web address]  Skype  LinkedIn

## 2023-05-21 ENCOUNTER — Ambulatory Visit: Payer: Medicare Other | Admitting: Radiation Oncology

## 2023-05-25 DIAGNOSIS — J449 Chronic obstructive pulmonary disease, unspecified: Secondary | ICD-10-CM | POA: Diagnosis not present

## 2023-05-25 DIAGNOSIS — R2681 Unsteadiness on feet: Secondary | ICD-10-CM | POA: Diagnosis not present

## 2023-05-26 DIAGNOSIS — M79671 Pain in right foot: Secondary | ICD-10-CM | POA: Diagnosis not present

## 2023-05-26 DIAGNOSIS — I872 Venous insufficiency (chronic) (peripheral): Secondary | ICD-10-CM | POA: Diagnosis not present

## 2023-05-26 DIAGNOSIS — J449 Chronic obstructive pulmonary disease, unspecified: Secondary | ICD-10-CM | POA: Diagnosis not present

## 2023-05-26 DIAGNOSIS — R2681 Unsteadiness on feet: Secondary | ICD-10-CM | POA: Diagnosis not present

## 2023-05-26 DIAGNOSIS — B351 Tinea unguium: Secondary | ICD-10-CM | POA: Diagnosis not present

## 2023-05-26 DIAGNOSIS — E1140 Type 2 diabetes mellitus with diabetic neuropathy, unspecified: Secondary | ICD-10-CM | POA: Diagnosis not present

## 2023-05-26 DIAGNOSIS — R600 Localized edema: Secondary | ICD-10-CM | POA: Diagnosis not present

## 2023-05-26 DIAGNOSIS — L608 Other nail disorders: Secondary | ICD-10-CM | POA: Diagnosis not present

## 2023-05-26 DIAGNOSIS — M79672 Pain in left foot: Secondary | ICD-10-CM | POA: Diagnosis not present

## 2023-05-26 DIAGNOSIS — L600 Ingrowing nail: Secondary | ICD-10-CM | POA: Diagnosis not present

## 2023-05-26 DIAGNOSIS — R234 Changes in skin texture: Secondary | ICD-10-CM | POA: Diagnosis not present

## 2023-05-28 DIAGNOSIS — R2681 Unsteadiness on feet: Secondary | ICD-10-CM | POA: Diagnosis not present

## 2023-05-28 DIAGNOSIS — J449 Chronic obstructive pulmonary disease, unspecified: Secondary | ICD-10-CM | POA: Diagnosis not present

## 2023-06-01 ENCOUNTER — Telehealth: Payer: Self-pay | Admitting: Radiation Oncology

## 2023-06-01 DIAGNOSIS — R2681 Unsteadiness on feet: Secondary | ICD-10-CM | POA: Diagnosis not present

## 2023-06-01 DIAGNOSIS — J449 Chronic obstructive pulmonary disease, unspecified: Secondary | ICD-10-CM | POA: Diagnosis not present

## 2023-06-01 NOTE — Telephone Encounter (Addendum)
2/11 @ 4:20 pm Patient's sister [name] called to confirm patient follow appointment for 2/25 and for nursing to call her Legal Guardian first, so she can be there with patient in person.  Also she requested to be in on the call on the date of her sister appointment, due to she is long-distance.  Secure chat sent to Ruel Favors, so they are aware.

## 2023-06-02 NOTE — Progress Notes (Incomplete)
Patient request...  Call Matthieu,Carol (Legal Guardian) [phone] first for patient's 06/15/23 "Post Treat" phone call.   Ruel Favors, LPN

## 2023-06-04 DIAGNOSIS — J449 Chronic obstructive pulmonary disease, unspecified: Secondary | ICD-10-CM | POA: Diagnosis not present

## 2023-06-04 DIAGNOSIS — F200 Paranoid schizophrenia: Secondary | ICD-10-CM | POA: Diagnosis not present

## 2023-06-04 DIAGNOSIS — R2681 Unsteadiness on feet: Secondary | ICD-10-CM | POA: Diagnosis not present

## 2023-06-07 DIAGNOSIS — J449 Chronic obstructive pulmonary disease, unspecified: Secondary | ICD-10-CM | POA: Diagnosis not present

## 2023-06-07 DIAGNOSIS — R2681 Unsteadiness on feet: Secondary | ICD-10-CM | POA: Diagnosis not present

## 2023-06-09 ENCOUNTER — Other Ambulatory Visit: Payer: Self-pay | Admitting: Urology

## 2023-06-09 DIAGNOSIS — J449 Chronic obstructive pulmonary disease, unspecified: Secondary | ICD-10-CM | POA: Diagnosis not present

## 2023-06-09 DIAGNOSIS — C3411 Malignant neoplasm of upper lobe, right bronchus or lung: Secondary | ICD-10-CM

## 2023-06-09 DIAGNOSIS — R2681 Unsteadiness on feet: Secondary | ICD-10-CM | POA: Diagnosis not present

## 2023-06-10 DIAGNOSIS — J449 Chronic obstructive pulmonary disease, unspecified: Secondary | ICD-10-CM | POA: Diagnosis not present

## 2023-06-10 DIAGNOSIS — R2681 Unsteadiness on feet: Secondary | ICD-10-CM | POA: Diagnosis not present

## 2023-06-11 ENCOUNTER — Other Ambulatory Visit: Payer: Self-pay | Admitting: Urology

## 2023-06-14 DIAGNOSIS — J449 Chronic obstructive pulmonary disease, unspecified: Secondary | ICD-10-CM | POA: Diagnosis not present

## 2023-06-14 DIAGNOSIS — R2681 Unsteadiness on feet: Secondary | ICD-10-CM | POA: Diagnosis not present

## 2023-06-15 ENCOUNTER — Ambulatory Visit
Admission: RE | Admit: 2023-06-15 | Discharge: 2023-06-15 | Disposition: A | Discharge status: Home / Self Care | Payer: Medicare Other | Source: Ambulatory Visit | Attending: Internal Medicine | Admitting: Internal Medicine

## 2023-06-16 ENCOUNTER — Telehealth: Payer: Self-pay | Admitting: *Deleted

## 2023-06-16 DIAGNOSIS — J449 Chronic obstructive pulmonary disease, unspecified: Secondary | ICD-10-CM | POA: Diagnosis not present

## 2023-06-16 DIAGNOSIS — R2681 Unsteadiness on feet: Secondary | ICD-10-CM | POA: Diagnosis not present

## 2023-06-16 NOTE — Telephone Encounter (Signed)
 Called patient to inform of CT for 08-11-23- arrival time- 11 am @ [facility], no restrictions to scan, patient to receive results from Marcello Fennel on 08-18-23 @ 10:30 am  via telephone, spoke with patient 's sister- Doreene Nest and she is aware of these appts. and the instructions

## 2023-06-16 NOTE — Progress Notes (Signed)
  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 Called x2. Voicemail left.  The patient has scheduled follow up with her medical oncologist [facility] pulmonology for ongoing care, and was encouraged to call if she develops concerns or questions regarding radiation.    Ruel Favors, LPN

## 2023-06-22 DIAGNOSIS — F200 Paranoid schizophrenia: Secondary | ICD-10-CM | POA: Diagnosis not present

## 2023-06-22 DIAGNOSIS — J449 Chronic obstructive pulmonary disease, unspecified: Secondary | ICD-10-CM | POA: Diagnosis not present

## 2023-06-22 DIAGNOSIS — R2681 Unsteadiness on feet: Secondary | ICD-10-CM | POA: Diagnosis not present

## 2023-06-23 DIAGNOSIS — J449 Chronic obstructive pulmonary disease, unspecified: Secondary | ICD-10-CM | POA: Diagnosis not present

## 2023-06-23 DIAGNOSIS — R2681 Unsteadiness on feet: Secondary | ICD-10-CM | POA: Diagnosis not present

## 2023-06-29 DIAGNOSIS — J449 Chronic obstructive pulmonary disease, unspecified: Secondary | ICD-10-CM | POA: Diagnosis not present

## 2023-06-29 DIAGNOSIS — R2681 Unsteadiness on feet: Secondary | ICD-10-CM | POA: Diagnosis not present

## 2023-06-30 DIAGNOSIS — R2681 Unsteadiness on feet: Secondary | ICD-10-CM | POA: Diagnosis not present

## 2023-06-30 DIAGNOSIS — J449 Chronic obstructive pulmonary disease, unspecified: Secondary | ICD-10-CM | POA: Diagnosis not present

## 2023-07-07 DIAGNOSIS — R2681 Unsteadiness on feet: Secondary | ICD-10-CM | POA: Diagnosis not present

## 2023-07-07 DIAGNOSIS — J449 Chronic obstructive pulmonary disease, unspecified: Secondary | ICD-10-CM | POA: Diagnosis not present

## 2023-07-08 DIAGNOSIS — J449 Chronic obstructive pulmonary disease, unspecified: Secondary | ICD-10-CM | POA: Diagnosis not present

## 2023-07-08 DIAGNOSIS — R2681 Unsteadiness on feet: Secondary | ICD-10-CM | POA: Diagnosis not present

## 2023-07-13 DIAGNOSIS — E278 Other specified disorders of adrenal gland: Secondary | ICD-10-CM | POA: Diagnosis not present

## 2023-07-13 DIAGNOSIS — E785 Hyperlipidemia, unspecified: Secondary | ICD-10-CM | POA: Diagnosis not present

## 2023-07-13 DIAGNOSIS — I10 Essential (primary) hypertension: Secondary | ICD-10-CM | POA: Diagnosis not present

## 2023-07-13 DIAGNOSIS — E039 Hypothyroidism, unspecified: Secondary | ICD-10-CM | POA: Diagnosis not present

## 2023-07-13 DIAGNOSIS — Z85118 Personal history of other malignant neoplasm of bronchus and lung: Secondary | ICD-10-CM | POA: Diagnosis not present

## 2023-07-13 DIAGNOSIS — F209 Schizophrenia, unspecified: Secondary | ICD-10-CM | POA: Diagnosis not present

## 2023-07-13 DIAGNOSIS — E1169 Type 2 diabetes mellitus with other specified complication: Secondary | ICD-10-CM | POA: Diagnosis not present

## 2023-07-15 DIAGNOSIS — R2681 Unsteadiness on feet: Secondary | ICD-10-CM | POA: Diagnosis not present

## 2023-07-15 DIAGNOSIS — J449 Chronic obstructive pulmonary disease, unspecified: Secondary | ICD-10-CM | POA: Diagnosis not present

## 2023-07-16 DIAGNOSIS — R2681 Unsteadiness on feet: Secondary | ICD-10-CM | POA: Diagnosis not present

## 2023-07-16 DIAGNOSIS — J449 Chronic obstructive pulmonary disease, unspecified: Secondary | ICD-10-CM | POA: Diagnosis not present

## 2023-07-19 DIAGNOSIS — J449 Chronic obstructive pulmonary disease, unspecified: Secondary | ICD-10-CM | POA: Diagnosis not present

## 2023-07-19 DIAGNOSIS — R2681 Unsteadiness on feet: Secondary | ICD-10-CM | POA: Diagnosis not present

## 2023-07-21 DIAGNOSIS — J449 Chronic obstructive pulmonary disease, unspecified: Secondary | ICD-10-CM | POA: Diagnosis not present

## 2023-07-21 DIAGNOSIS — R2681 Unsteadiness on feet: Secondary | ICD-10-CM | POA: Diagnosis not present

## 2023-07-23 DIAGNOSIS — H401131 Primary open-angle glaucoma, bilateral, mild stage: Secondary | ICD-10-CM | POA: Diagnosis not present

## 2023-07-23 DIAGNOSIS — H2512 Age-related nuclear cataract, left eye: Secondary | ICD-10-CM | POA: Diagnosis not present

## 2023-07-28 DIAGNOSIS — R2681 Unsteadiness on feet: Secondary | ICD-10-CM | POA: Diagnosis not present

## 2023-07-28 DIAGNOSIS — J449 Chronic obstructive pulmonary disease, unspecified: Secondary | ICD-10-CM | POA: Diagnosis not present

## 2023-07-30 DIAGNOSIS — R2681 Unsteadiness on feet: Secondary | ICD-10-CM | POA: Diagnosis not present

## 2023-07-30 DIAGNOSIS — J449 Chronic obstructive pulmonary disease, unspecified: Secondary | ICD-10-CM | POA: Diagnosis not present

## 2023-08-03 DIAGNOSIS — R2681 Unsteadiness on feet: Secondary | ICD-10-CM | POA: Diagnosis not present

## 2023-08-03 DIAGNOSIS — J449 Chronic obstructive pulmonary disease, unspecified: Secondary | ICD-10-CM | POA: Diagnosis not present

## 2023-08-06 DIAGNOSIS — R2681 Unsteadiness on feet: Secondary | ICD-10-CM | POA: Diagnosis not present

## 2023-08-06 DIAGNOSIS — J449 Chronic obstructive pulmonary disease, unspecified: Secondary | ICD-10-CM | POA: Diagnosis not present

## 2023-08-11 ENCOUNTER — Ambulatory Visit (HOSPITAL_COMMUNITY): Payer: Medicare Other | Attending: Urology

## 2023-08-11 DIAGNOSIS — R2681 Unsteadiness on feet: Secondary | ICD-10-CM | POA: Diagnosis not present

## 2023-08-11 DIAGNOSIS — J449 Chronic obstructive pulmonary disease, unspecified: Secondary | ICD-10-CM | POA: Diagnosis not present

## 2023-08-12 DIAGNOSIS — J449 Chronic obstructive pulmonary disease, unspecified: Secondary | ICD-10-CM | POA: Diagnosis not present

## 2023-08-12 DIAGNOSIS — R2681 Unsteadiness on feet: Secondary | ICD-10-CM | POA: Diagnosis not present

## 2023-08-16 ENCOUNTER — Telehealth: Payer: Self-pay | Admitting: *Deleted

## 2023-08-16 NOTE — Telephone Encounter (Signed)
 CALLED PATIENT TO INFORM OF CT FOR 08-20-23- ARRIVAL TIME- 9:45 AM @ [facility], NO RESTRICTIONS TO SCAN, PATIENT TO RECEIVE RESULTS FROM ASHLYN BRUNING VIA TELEPHONE ON 08/26/23 @ 9 AM, LVM FOR A RETURN CALL

## 2023-08-17 DIAGNOSIS — J449 Chronic obstructive pulmonary disease, unspecified: Secondary | ICD-10-CM | POA: Diagnosis not present

## 2023-08-17 DIAGNOSIS — R2681 Unsteadiness on feet: Secondary | ICD-10-CM | POA: Diagnosis not present

## 2023-08-18 ENCOUNTER — Telehealth: Payer: Self-pay | Admitting: *Deleted

## 2023-08-18 ENCOUNTER — Ambulatory Visit: Payer: Self-pay | Admitting: Urology

## 2023-08-18 NOTE — Telephone Encounter (Signed)
 Called patient's sister- Garret Kales to give info., lvm for a return call

## 2023-08-19 DIAGNOSIS — R2681 Unsteadiness on feet: Secondary | ICD-10-CM | POA: Diagnosis not present

## 2023-08-19 DIAGNOSIS — J449 Chronic obstructive pulmonary disease, unspecified: Secondary | ICD-10-CM | POA: Diagnosis not present

## 2023-08-20 ENCOUNTER — Ambulatory Visit (HOSPITAL_COMMUNITY): Attending: Urology

## 2023-08-23 DIAGNOSIS — J449 Chronic obstructive pulmonary disease, unspecified: Secondary | ICD-10-CM | POA: Diagnosis not present

## 2023-08-23 DIAGNOSIS — R2681 Unsteadiness on feet: Secondary | ICD-10-CM | POA: Diagnosis not present

## 2023-08-24 NOTE — Progress Notes (Signed)
 Apt rescheduled

## 2023-08-25 ENCOUNTER — Telehealth: Payer: Self-pay | Admitting: *Deleted

## 2023-08-25 DIAGNOSIS — R2681 Unsteadiness on feet: Secondary | ICD-10-CM | POA: Diagnosis not present

## 2023-08-25 DIAGNOSIS — J449 Chronic obstructive pulmonary disease, unspecified: Secondary | ICD-10-CM | POA: Diagnosis not present

## 2023-08-25 NOTE — Telephone Encounter (Signed)
 CALLED PATIENT'S CARETAKER CAROL TO INFORM OF CT FOR 09-09-23- ARRIVAL TIME- 9:45 AM @ [facility], NO RESTRICTIOINS TO SCAN, PATIENT AND SISTER- [name] CARTER  TO RECEIVE RESULTS FROM CT ON 09-15-23 @ 11:30 AM VIA TELEPHONE BY ASHLYN BRUNING SPOKE WITH PATIENT'S CAREGIVER CAROL, AND SHE IS AWARE OF THESE APPTS. AND THE INSTRUCTIONS

## 2023-08-25 NOTE — Telephone Encounter (Signed)
 CALLED PATIENT'S SISTER ([name] CARTER) TO INFORM HER THAT HER SISTER DIDN'T SHOW FOR SCAN, SPOKE WITH MS. CARTER AND SHE IS AWARE OF THIS

## 2023-08-26 ENCOUNTER — Inpatient Hospital Stay
Admission: RE | Admit: 2023-08-26 | Discharge: 2023-08-26 | Disposition: A | Discharge status: Home / Self Care | Source: Ambulatory Visit | Attending: Urology | Admitting: Urology

## 2023-09-01 DIAGNOSIS — J449 Chronic obstructive pulmonary disease, unspecified: Secondary | ICD-10-CM | POA: Diagnosis not present

## 2023-09-01 DIAGNOSIS — R2681 Unsteadiness on feet: Secondary | ICD-10-CM | POA: Diagnosis not present

## 2023-09-02 DIAGNOSIS — J449 Chronic obstructive pulmonary disease, unspecified: Secondary | ICD-10-CM | POA: Diagnosis not present

## 2023-09-02 DIAGNOSIS — R2681 Unsteadiness on feet: Secondary | ICD-10-CM | POA: Diagnosis not present

## 2023-09-08 DIAGNOSIS — R2681 Unsteadiness on feet: Secondary | ICD-10-CM | POA: Diagnosis not present

## 2023-09-08 DIAGNOSIS — J449 Chronic obstructive pulmonary disease, unspecified: Secondary | ICD-10-CM | POA: Diagnosis not present

## 2023-09-09 ENCOUNTER — Ambulatory Visit (HOSPITAL_COMMUNITY)
Admission: RE | Admit: 2023-09-09 | Discharge: 2023-09-09 | Disposition: A | Discharge status: Home / Self Care | Source: Ambulatory Visit | Attending: Urology | Admitting: Urology

## 2023-09-09 DIAGNOSIS — C3490 Malignant neoplasm of unspecified part of unspecified bronchus or lung: Secondary | ICD-10-CM | POA: Diagnosis not present

## 2023-09-09 DIAGNOSIS — C3411 Malignant neoplasm of upper lobe, right bronchus or lung: Secondary | ICD-10-CM | POA: Diagnosis not present

## 2023-09-09 DIAGNOSIS — J449 Chronic obstructive pulmonary disease, unspecified: Secondary | ICD-10-CM | POA: Diagnosis not present

## 2023-09-09 DIAGNOSIS — R2681 Unsteadiness on feet: Secondary | ICD-10-CM | POA: Diagnosis not present

## 2023-09-09 DIAGNOSIS — J439 Emphysema, unspecified: Secondary | ICD-10-CM | POA: Diagnosis not present

## 2023-09-14 DIAGNOSIS — B351 Tinea unguium: Secondary | ICD-10-CM | POA: Diagnosis not present

## 2023-09-14 DIAGNOSIS — R2681 Unsteadiness on feet: Secondary | ICD-10-CM | POA: Diagnosis not present

## 2023-09-14 DIAGNOSIS — E1140 Type 2 diabetes mellitus with diabetic neuropathy, unspecified: Secondary | ICD-10-CM | POA: Diagnosis not present

## 2023-09-14 DIAGNOSIS — R234 Changes in skin texture: Secondary | ICD-10-CM | POA: Diagnosis not present

## 2023-09-14 DIAGNOSIS — L600 Ingrowing nail: Secondary | ICD-10-CM | POA: Diagnosis not present

## 2023-09-14 DIAGNOSIS — M79672 Pain in left foot: Secondary | ICD-10-CM | POA: Diagnosis not present

## 2023-09-14 DIAGNOSIS — M79671 Pain in right foot: Secondary | ICD-10-CM | POA: Diagnosis not present

## 2023-09-14 DIAGNOSIS — J449 Chronic obstructive pulmonary disease, unspecified: Secondary | ICD-10-CM | POA: Diagnosis not present

## 2023-09-14 DIAGNOSIS — R600 Localized edema: Secondary | ICD-10-CM | POA: Diagnosis not present

## 2023-09-14 DIAGNOSIS — L608 Other nail disorders: Secondary | ICD-10-CM | POA: Diagnosis not present

## 2023-09-14 DIAGNOSIS — I872 Venous insufficiency (chronic) (peripheral): Secondary | ICD-10-CM | POA: Diagnosis not present

## 2023-09-14 NOTE — Progress Notes (Signed)
 Telephone nursing appointment for review of most recent CT-Chest results. I verified patient's identity x2 and began nursing interview.    Patient states issues as follows... -Pain: *** -Fatigue: *** -Chest: *** -Lungs: *** -Cardiac: *** -Skin: *** -Appetite: ***   Patient denies any other related issues at this time.   Meaningful use complete.   Patient aware of their telephone appointment w/ Ashlyn Bruning PA-C. I left my extension [phone] in case patient needs anything. Patient verbalized understanding. This concludes the nursing interview.   Patient preferred phone # [phone]    Avery Bodo, LPN

## 2023-09-15 ENCOUNTER — Ambulatory Visit
Admission: RE | Admit: 2023-09-15 | Discharge: 2023-09-15 | Disposition: A | Discharge status: Home / Self Care | Source: Ambulatory Visit | Attending: Urology | Admitting: Urology

## 2023-09-16 ENCOUNTER — Ambulatory Visit
Admission: RE | Admit: 2023-09-16 | Discharge: 2023-09-16 | Disposition: A | Discharge status: Home / Self Care | Source: Ambulatory Visit | Attending: Urology | Admitting: Urology

## 2023-09-16 ENCOUNTER — Telehealth: Payer: Self-pay | Admitting: Urology

## 2023-09-16 ENCOUNTER — Encounter: Payer: Self-pay | Admitting: Urology

## 2023-09-16 DIAGNOSIS — R2681 Unsteadiness on feet: Secondary | ICD-10-CM | POA: Diagnosis not present

## 2023-09-16 DIAGNOSIS — C3411 Malignant neoplasm of upper lobe, right bronchus or lung: Secondary | ICD-10-CM

## 2023-09-16 DIAGNOSIS — Z87891 Personal history of nicotine dependence: Secondary | ICD-10-CM | POA: Diagnosis not present

## 2023-09-16 DIAGNOSIS — J449 Chronic obstructive pulmonary disease, unspecified: Secondary | ICD-10-CM | POA: Diagnosis not present

## 2023-09-16 NOTE — Telephone Encounter (Signed)
 5/29 Patient has been r/s for 1:30 pm on today.  Left voicemail with Talbert Face pt's sister so they are aware.

## 2023-09-16 NOTE — Telephone Encounter (Signed)
 5/29 Patient's sister called about patient missed her f/u appt over the phone on yesterday would like to r/s.  She stated did not received call and would like to r/s.  Secure chat sent to [employer] B[employer], so they are aware.

## 2023-09-16 NOTE — Progress Notes (Signed)
 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   Narrative:  I spoke with the patient and her sister, Alexandra Lamb, to conduct her routine scheduled 4 month follow up visit via telephone to spare the patient unnecessary potential exposure in the healthcare setting during the current COVID-19 pandemic.  The patient was notified in advance and gave permission to proceed with this visit format.     She tolerated the SBRT very well with only modest fatigue.  Her posttreatment CT chest performed on 09/09/2023 shows a stable appearance of the treated RUL lung nodule.  However, there is a new 1.2 cm nodule in the LLL lung that could be inflammation versus neoplasm so the recommendation is for a short interval follow-up chest CT in 2-3 months.  We reviewed these results by telephone today.                            On review of systems, obtained from her sister, Alexandra Lamb, the patient has not been complaining of cough, increased shortness of breath, hemoptysis, chest pain, fever or chills. She reports a healthy appetite and overall, is pleased with her progress to date.  ALLERGIES:  is allergic to metformin.  Meds: Current Outpatient Medications  Medication Sig Dispense Refill   acetaminophen  (TYLENOL ) 500 MG tablet Take 500 mg by mouth every 6 (six) hours as needed.       amLODipine  (NORVASC ) 2.5 MG tablet Take 2.5 mg by mouth daily.     amoxicillin  (AMOXIL ) 500 MG capsule Take 1 capsule (500 mg total) by mouth 3 (three) times daily for 4 days (Patient not taking: Reported on 03/29/2023) 12 capsule 0    atenolol  (TENORMIN ) 100 MG tablet Take 100 mg by mouth daily.     Cholecalciferol  (RA VITAMIN D -3) 2000 units CAPS Take 1 capsule (2,000 Units total) by mouth daily. 90 capsule 3   divalproex (DEPAKOTE ER) 500 MG 24 hr tablet 1,500 mg at bedtime.     latanoprost  (XALATAN ) 0.005 % ophthalmic solution Place 1 drop into both eyes at bedtime.     levothyroxine  (SYNTHROID ) 137 MCG tablet SMARTSIG:1.0 Tablet(s) By Mouth Daily     memantine  (NAMENDA ) 10 MG tablet Take 10 mg by mouth 2 (two) times daily.     metFORMIN (GLUCOPHAGE-XR) 500 MG 24 hr tablet Take 500 mg by mouth daily.     Multiple Vitamins-Minerals (CENTRUM ULTRA WOMENS) TABS as directed Orally once a day for 100 days     OLANZapine  (ZYPREXA ) 15 MG tablet Take 15 mg by mouth at bedtime.     OLANZapine  (ZYPREXA ) 5 MG tablet Take 5 mg by mouth daily.     OZEMPIC, 2 MG/DOSE, 8 MG/3ML SOPN      simvastatin  (ZOCOR ) 20 MG tablet Take 20 mg by mouth daily.     valsartan -hydrochlorothiazide  (DIOVAN -HCT) 320-25 MG tablet SMARTSIG:1.0 Tablet(s) By Mouth Daily     No current facility-administered medications for this encounter.    Physical  Findings:  vitals were not taken for this visit.  Pain Assessment Pain Score: 0-No pain/10 Unable to assess due to telephone follow-up visit format.  Lab Findings: Lab Results  Component Value Date   WBC 8.8 03/29/2023   HGB 13.4 03/29/2023   HCT 39.4 03/29/2023   MCV 84.4 03/29/2023   PLT 187 03/29/2023     Radiographic Findings: CT Chest Wo Contrast Result Date: 09/14/2023 CLINICAL DATA:  Restaging exam for non-small cell lung cancer, assess treatment response. EXAM: CT CHEST WITHOUT CONTRAST TECHNIQUE: Multidetector CT imaging of the chest was performed following the standard protocol without IV contrast. RADIATION DOSE REDUCTION: This exam was performed according to the departmental dose-optimization program which includes automated exposure control, adjustment of the mA and/or kV according to  patient size and/or use of iterative reconstruction technique. COMPARISON:  PET-CT 03/12/2023, chest CT without contrast 01/29/2023, and CTA chest 06/20/2022. FINDINGS: Cardiovascular: There is mild cardiomegaly. No pericardial effusion. There are left main and three-vessel coronary artery calcifications, moderate calcific plaque in the aorta great vessels, and no aortic aneurysm. The pulmonary trunk remains prominent 3.1 cm indicating arterial hypertension. There is no venous dilatation. Mediastinum/Nodes: No enlarged mediastinal or axillary lymph nodes are identified without contrast. Thyroid  gland, trachea, and esophagus demonstrate no significant findings. There are surgical clips in the left axilla, old left mastectomy. Lungs/Pleura: Respiratory motion limits the study. Again noted is a 1.5 x 1 cm lobulated solid nodule in the apical segment of the right upper lobe on 8:27 with pleural stranding. This is unchanged in overall size compared with the last 2 studies but is slightly larger than on 06/20/2022. There previously was a small cavitary component to this. A cavity is no longer seen in this lesion. This could indicate soft tissue filling in the cavity. There is a loose cluster of 3 tiny nodules in the lateral right upper lobe apex on images 32-34 of series 8, largest is 3 mm, stable probably due to small airway impactions. There is a calcified granuloma in the left lung apex at about this level. There is a new, ill-defined slightly lobular solid left lower lobe nodule laterally measuring 1.2 x 0.6 cm on 8:78, nonspecific and could be inflammatory or neoplasm. There is no consolidation, effusion or further nodules. There are mild centrilobular emphysematous changes in both upper lobes. Upper Abdomen: Stable bilateral adrenal adenomas, largest is 1.5 cm on the left. No acute upper abdominal findings. Abdominal aortic and visceral arterial calcific atherosclerosis. Musculoskeletal: Mild kyphodextroscoliosis  and degenerative change thoracic spine with multilevel bridging enthesopathy. No regional bone metastasis is seen. There are bilateral overlying external breast prostheses with old left mastectomy. IMPRESSION: 1. 1.5 x 1 cm lobulated solid nodule in the apical segment of the right upper lobe with pleural stranding, unchanged in overall size compared with the last 2 studies but slightly larger than on 06/20/2022. There previously was a small cavitary component to this. A cavity is no longer seen in this lesion. This could indicate soft tissue filling in the cavity. 2. New 1.2 x 0.6 cm ill-defined slightly lobular solid left lower lobe nodule laterally, nonspecific and could be inflammatory or neoplastic. A 2 month follow-up CT or PET-CT is recommended. 3. Stable loose cluster of 3 tiny nodules in the lateral right upper lobe apex, largest is 3 mm, probably due to small airway impactions. 4. Mild emphysema. 5. Cardiomegaly with calcific aortic and coronary artery disease. 6. Prominent pulmonary trunk 3.1 cm indicating arterial hypertension. 7. Stable bilateral adrenal  adenomas. 8. Old left mastectomy with bilateral external breast prostheses. Aortic Atherosclerosis (ICD10-I70.0) and Emphysema (ICD10-J43.9). Electronically Signed   By: Denman Fischer M.D.   On: 09/14/2023 23:37    Impression/Plan: 1. 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 She appears to have recovered well from the effects of her recent SBRT. Her posttreatment CT chest performed on 09/09/2023 shows a stable appearance of the treated RUL lung nodule.  However, there is a new 1.2 cm nodule in the LLL lung that could be inflammation versus neoplasm so the recommendation is for a short interval follow-up chest CT in 3 months.  We reviewed the results and recommendations and they are in agreement to proceed with short interval CT chest imaging in August 2025.  I will plan to contact them by  telephone to review the results and recommendations at that time.  Pending this scan is stable, we will then moved to serial 14-month CT chest scans to continue to monitor for any evidence of disease progression or recurrence.  Once we reach 5 years without evidence of disease, we will then move to annual low-dose screening CT chest scans.  They appear to have a good understanding of our recommendations and are comfortable and in agreement with the stated plan.  They know that they are welcome to call at anytime in the interim with any questions or concerns.   We personally spent 30 minutes in this encounter including chart review, reviewing radiological studies, telephone conversation with the patient and her sister, Alexandra Lamb, entering orders, coordinating care and completing documentation.    Arta Bihari, PA-C

## 2023-09-16 NOTE — Progress Notes (Addendum)
 Telephone nursing appointment for review of most recent CT-Chest results. I verified patient's sister's identity x2, Ms. Alexandra Lamb and began nursing interview.   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  -Pain: Denies -Fatigue: Denies -Skin: Denies -Lungs: Denies -Appetite: Good   Patient denies any other related issues at this time.   Meaningful use complete.   Patient aware of their telephone appointment w/ Ashlyn Bruning PA-C. I left my extension [phone] in case patient needs anything. Patient verbalized understanding. This concludes the nursing interview.   Patient preferred phone # [phone]   Avery Bodo, LPN

## 2023-09-21 DIAGNOSIS — J449 Chronic obstructive pulmonary disease, unspecified: Secondary | ICD-10-CM | POA: Diagnosis not present

## 2023-09-21 DIAGNOSIS — R2681 Unsteadiness on feet: Secondary | ICD-10-CM | POA: Diagnosis not present

## 2023-09-23 DIAGNOSIS — R2681 Unsteadiness on feet: Secondary | ICD-10-CM | POA: Diagnosis not present

## 2023-09-23 DIAGNOSIS — J449 Chronic obstructive pulmonary disease, unspecified: Secondary | ICD-10-CM | POA: Diagnosis not present

## 2023-09-27 DIAGNOSIS — J449 Chronic obstructive pulmonary disease, unspecified: Secondary | ICD-10-CM | POA: Diagnosis not present

## 2023-09-27 DIAGNOSIS — R2681 Unsteadiness on feet: Secondary | ICD-10-CM | POA: Diagnosis not present

## 2023-09-29 DIAGNOSIS — R2681 Unsteadiness on feet: Secondary | ICD-10-CM | POA: Diagnosis not present

## 2023-09-29 DIAGNOSIS — J449 Chronic obstructive pulmonary disease, unspecified: Secondary | ICD-10-CM | POA: Diagnosis not present

## 2023-10-01 DIAGNOSIS — J449 Chronic obstructive pulmonary disease, unspecified: Secondary | ICD-10-CM | POA: Diagnosis not present

## 2023-10-01 DIAGNOSIS — R2681 Unsteadiness on feet: Secondary | ICD-10-CM | POA: Diagnosis not present

## 2023-10-05 DIAGNOSIS — R2681 Unsteadiness on feet: Secondary | ICD-10-CM | POA: Diagnosis not present

## 2023-10-05 DIAGNOSIS — J449 Chronic obstructive pulmonary disease, unspecified: Secondary | ICD-10-CM | POA: Diagnosis not present

## 2023-10-06 DIAGNOSIS — J449 Chronic obstructive pulmonary disease, unspecified: Secondary | ICD-10-CM | POA: Diagnosis not present

## 2023-10-06 DIAGNOSIS — R2681 Unsteadiness on feet: Secondary | ICD-10-CM | POA: Diagnosis not present

## 2023-10-08 DIAGNOSIS — J449 Chronic obstructive pulmonary disease, unspecified: Secondary | ICD-10-CM | POA: Diagnosis not present

## 2023-10-08 DIAGNOSIS — R2681 Unsteadiness on feet: Secondary | ICD-10-CM | POA: Diagnosis not present

## 2023-10-11 DIAGNOSIS — R2681 Unsteadiness on feet: Secondary | ICD-10-CM | POA: Diagnosis not present

## 2023-10-11 DIAGNOSIS — J449 Chronic obstructive pulmonary disease, unspecified: Secondary | ICD-10-CM | POA: Diagnosis not present

## 2023-10-12 DIAGNOSIS — J449 Chronic obstructive pulmonary disease, unspecified: Secondary | ICD-10-CM | POA: Diagnosis not present

## 2023-10-12 DIAGNOSIS — R2681 Unsteadiness on feet: Secondary | ICD-10-CM | POA: Diagnosis not present

## 2023-10-13 DIAGNOSIS — J449 Chronic obstructive pulmonary disease, unspecified: Secondary | ICD-10-CM | POA: Diagnosis not present

## 2023-10-13 DIAGNOSIS — R2681 Unsteadiness on feet: Secondary | ICD-10-CM | POA: Diagnosis not present

## 2023-10-18 ENCOUNTER — Telehealth: Payer: Self-pay | Admitting: *Deleted

## 2023-10-18 NOTE — Telephone Encounter (Signed)
 CALLED PATIENT'S SISTER [name] CARTER TO GET HER IN-PUT, LVM FOR A RETURN CALL

## 2023-10-28 ENCOUNTER — Telehealth: Payer: Self-pay | Admitting: *Deleted

## 2023-10-28 NOTE — Telephone Encounter (Signed)
 Called patient to inform of Ct for 12-17-23- arrival time- 12:45 pm @ [facility], no restrictions to scan, patient to receive results from Ashlyn Bruning on 12-22-23 @ 11:30 am, via telephone, PA Ashlyn Bruning will call patient's sister [name] with these results

## 2023-11-24 DIAGNOSIS — Z1231 Encounter for screening mammogram for malignant neoplasm of breast: Secondary | ICD-10-CM | POA: Diagnosis not present

## 2023-12-17 ENCOUNTER — Ambulatory Visit (HOSPITAL_COMMUNITY)
Admission: RE | Admit: 2023-12-17 | Discharge: 2023-12-17 | Disposition: A | Discharge status: Home / Self Care | Source: Ambulatory Visit | Attending: Urology | Admitting: Urology

## 2023-12-17 DIAGNOSIS — I700 Atherosclerosis of aorta: Secondary | ICD-10-CM | POA: Diagnosis not present

## 2023-12-17 DIAGNOSIS — C3411 Malignant neoplasm of upper lobe, right bronchus or lung: Secondary | ICD-10-CM | POA: Diagnosis not present

## 2023-12-17 DIAGNOSIS — C3490 Malignant neoplasm of unspecified part of unspecified bronchus or lung: Secondary | ICD-10-CM | POA: Diagnosis not present

## 2023-12-17 DIAGNOSIS — J432 Centrilobular emphysema: Secondary | ICD-10-CM | POA: Diagnosis not present

## 2023-12-21 DIAGNOSIS — F200 Paranoid schizophrenia: Secondary | ICD-10-CM | POA: Diagnosis not present

## 2023-12-22 ENCOUNTER — Ambulatory Visit
Admission: RE | Admit: 2023-12-22 | Discharge: 2023-12-22 | Disposition: A | Discharge status: Home / Self Care | Source: Ambulatory Visit | Attending: Urology | Admitting: Urology

## 2023-12-22 ENCOUNTER — Encounter: Payer: Self-pay | Admitting: Urology

## 2023-12-22 DIAGNOSIS — R911 Solitary pulmonary nodule: Secondary | ICD-10-CM

## 2023-12-22 DIAGNOSIS — C3411 Malignant neoplasm of upper lobe, right bronchus or lung: Secondary | ICD-10-CM

## 2023-12-22 DIAGNOSIS — Z87891 Personal history of nicotine dependence: Secondary | ICD-10-CM | POA: Diagnosis not present

## 2023-12-22 NOTE — Progress Notes (Signed)
 Alexandra Lamb  is has telephone follow up appointment  post radiation to lung. Spoke with Patients legal guardian [name]. Patients identity verified.   Lung Side: Right Upper lobe, patient completed treatment on 05/19/23  Does the patient complain of any of the following: Pain:No Shortness of breath w/wo exertion:  remains at baseline; Cough: remains at baseline Hemoptysis: No Pain with swallowing: No Swallowing/choking concerns: No Appetite: good  Energy Level:fair   Post radiation skin Changes: No    Additional comments if applicable:

## 2023-12-22 NOTE — Progress Notes (Signed)
 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   Narrative:  I spoke with the patient and her sister, Alexandra Lamb, to conduct her routine scheduled 4 month follow up visit via telephone to spare the patient unnecessary potential exposure in the healthcare setting.  The patient was notified in advance and gave permission to proceed with this visit format.     She tolerated the SBRT very well with only modest fatigue.  Her posttreatment CT chest performed on 09/09/2023 showed a stable appearance of the treated RUL lung nodule but there was a new 1.2 cm nodule in the LLL lung that could be inflammation versus neoplasm so the recommendation was for a short interval follow-up chest CT in 3 months.  She had the repeat CT Chest scan on 12/17/23 and this shows some slight interval enlargement of the spiculated nodule in the LLL lung, measuring 1.5 x 0.9 cm as compared to 1.3 x 0.7 cm on prior scan from 08/2023. The previously treated RUL lung nodule is stable in appearance and there is no evidence of lymphadenopathy or metastatic disease in the chest.  We reviewed these results by telephone today.  The recommendation is for further evaluation with PET.                       On review of systems, obtained from her sister, Alexandra Lamb, the patient has not been complaining of cough, increased shortness of breath, hemoptysis, chest pain, fever or chills. She reports a healthy appetite and overall, is pleased with her progress to date.  ALLERGIES:  is allergic to metformin.  Meds: Current Outpatient Medications   Medication Sig Dispense Refill   acetaminophen  (TYLENOL ) 500 MG tablet Take 500 mg by mouth every 6 (six) hours as needed.       amLODipine  (NORVASC ) 2.5 MG tablet Take 2.5 mg by mouth daily.     amoxicillin  (AMOXIL ) 500 MG capsule Take 1 capsule (500 mg total) by mouth 3 (three) times daily for 4 days (Patient not taking: Reported on 03/29/2023) 12 capsule 0   atenolol  (TENORMIN ) 100 MG tablet Take 100 mg by mouth daily.     Cholecalciferol  (RA VITAMIN D -3) 2000 units CAPS Take 1 capsule (2,000 Units total) by mouth daily. 90 capsule 3   divalproex (DEPAKOTE ER) 500 MG 24 hr tablet 1,500 mg at bedtime.     latanoprost  (XALATAN ) 0.005 % ophthalmic solution Place 1 drop into both eyes at bedtime.     levothyroxine  (SYNTHROID ) 137 MCG tablet SMARTSIG:1.0 Tablet(s) By Mouth Daily     memantine  (NAMENDA ) 10 MG tablet Take 10 mg by mouth 2 (two) times daily.     metFORMIN (GLUCOPHAGE-XR) 500 MG 24 hr tablet Take 500 mg by mouth daily.     Multiple Vitamins-Minerals (CENTRUM ULTRA WOMENS) TABS as directed Orally once a day for 100 days     OLANZapine  (ZYPREXA ) 15 MG tablet Take 15 mg by mouth at bedtime.     OLANZapine  (ZYPREXA ) 5  MG tablet Take 5 mg by mouth daily.     OZEMPIC, 2 MG/DOSE, 8 MG/3ML SOPN      simvastatin  (ZOCOR ) 20 MG tablet Take 20 mg by mouth daily.     valsartan -hydrochlorothiazide  (DIOVAN -HCT) 320-25 MG tablet SMARTSIG:1.0 Tablet(s) By Mouth Daily     No current facility-administered medications for this encounter.    Physical Findings:  vitals were not taken for this visit.  Pain Assessment Pain Score: 0-No pain/10 Unable to assess due to telephone follow-up visit format.  Lab Findings: Lab Results  Component Value Date   WBC 8.8 03/29/2023   HGB 13.4 03/29/2023   HCT 39.4 03/29/2023   MCV 84.4 03/29/2023   PLT 187 03/29/2023     Radiographic Findings: CT Chest Wo Contrast Result Date: 12/22/2023 CLINICAL DATA:  Non-small cell lung cancer restaging * Tracking  Code: BO * EXAM: CT CHEST WITHOUT CONTRAST TECHNIQUE: Multidetector CT imaging of the chest was performed following the standard protocol without IV contrast. RADIATION DOSE REDUCTION: This exam was performed according to the departmental dose-optimization program which includes automated exposure control, adjustment of the mA and/or kV according to patient size and/or use of iterative reconstruction technique. COMPARISON:  09/09/2023 FINDINGS: Cardiovascular: Aortic atherosclerosis. Normal heart size. Three-vessel coronary artery calcifications. No pericardial effusion. Mediastinum/Nodes: No enlarged mediastinal, hilar, or axillary lymph nodes. Thyroid  gland, trachea, and esophagus demonstrate no significant findings. Lungs/Pleura: Suspect slight interval enlargement of a spiculated nodule in the left lower lobe, measuring 1.5 x 0.9 cm, previously 1.3 x 0.7 cm when measured similarly (series 5, image 86). No significant change in size of a spiculated nodule in the anterior right apex measuring 1.2 x 0.7 cm and remeasured with similar technique (series 5, image 28). Slight interval increase in adjacent ground-glass. Other tiny nodules unchanged. Background of mild centrilobular and paraseptal emphysema. No pleural effusion or pneumothorax. Upper Abdomen: No acute abnormality. Benign adenomatous thickening of the bilateral adrenal glands. Musculoskeletal: Status post left mastectomy. No acute osseous findings. IMPRESSION: 1. Suspect slight interval enlargement of a spiculated nodule in the left lower lobe, measuring 1.5 x 0.9 cm, previously 1.3 x 0.7 cm when measured similarly. This is highly suspicious for multifocal adenocarcinoma. 2. No significant change in size of a spiculated nodule in the anterior right apex. Slight interval increase in adjacent ground-glass, consistent with developing radiation pneumonitis. 3. No evidence of lymphadenopathy or metastatic disease in the chest. 4. Emphysema. 5. Coronary artery  disease. Aortic Atherosclerosis (ICD10-I70.0) and Emphysema (ICD10-J43.9). Electronically Signed   By: Alexandra Lamb M.D.   On: 12/22/2023 07:07    Impression/Plan: 1. 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 She appears to have recovered well from the effects of the SBRT.  We reviewed the results and recommendations and they are in agreement to proceed with short interval CT chest imaging in August 2025.  The follow up CT Chest scan from 12/17/23 shows some slight interval enlargement of the spiculated nodule in the LLL lung, measuring 1.5 x 0.9 cm as compared to 1.3 x 0.7 cm on prior scan from 08/2023. The previously treated RUL lung nodule is stable in appearance and there is no evidence of lymphadenopathy or metastatic disease in the chest.  We reviewed these results by telephone today and the recommendation for further evaluation with PET which they are in agreement with  Her sister, Alexandra Lamb, will be here in [place] visiting from 01/10/24 - 01/20/24 so they  would like to coordinate for the PET to be done that week so Alexandra Lamb can help with transportation. I will plan to contact them by telephone to review the results as soon as they are available. They appear to have a good understanding of our recommendations and are comfortable and in agreement with the stated plan.  They know that they are welcome to call at anytime in the interim with any questions or concerns.  I personally spent 30 minutes in this encounter including chart review, reviewing radiological studies, telephone conversation with the patient and her sister, Alexandra Lamb, entering orders, coordinating care and completing documentation.    [name], PA-C

## 2023-12-23 ENCOUNTER — Telehealth: Payer: Self-pay | Admitting: *Deleted

## 2023-12-23 NOTE — Telephone Encounter (Signed)
 CALLED PATIENT'S SISTER- [name] CARTER TO INFORM OF PET SCAN FOR 01-11-24- ARRIVAL TIME- 2 PM @ [facility], PATIENT TO HAVE WATER ONLY- 6 HRS. PRIOR TO SCAN, PATIENT'S SISTER- [name] CARTER TO RECEIVE RESULTS FROM ASHLYN BRUNING ON 01-12-24 @ 9:30 AM VIA TELEPHONE, SPOKE WITH MS. CARTER AND SHE IS AWARE OF THESE APPTS. AND THE INSTRUCTIONS

## 2023-12-27 ENCOUNTER — Other Ambulatory Visit (HOSPITAL_BASED_OUTPATIENT_CLINIC_OR_DEPARTMENT_OTHER): Payer: Self-pay

## 2024-01-10 ENCOUNTER — Other Ambulatory Visit (HOSPITAL_COMMUNITY)

## 2024-01-11 ENCOUNTER — Encounter (HOSPITAL_COMMUNITY)
Admission: RE | Admit: 2024-01-11 | Discharge: 2024-01-11 | Disposition: A | Discharge status: Home / Self Care | Source: Ambulatory Visit | Attending: Urology | Admitting: Urology

## 2024-01-11 DIAGNOSIS — C3411 Malignant neoplasm of upper lobe, right bronchus or lung: Secondary | ICD-10-CM | POA: Insufficient documentation

## 2024-01-11 DIAGNOSIS — C3432 Malignant neoplasm of lower lobe, left bronchus or lung: Secondary | ICD-10-CM | POA: Diagnosis not present

## 2024-01-11 DIAGNOSIS — R911 Solitary pulmonary nodule: Secondary | ICD-10-CM | POA: Diagnosis present

## 2024-01-11 LAB — GLUCOSE, CAPILLARY: Glucose-Capillary: 166 mg/dL — ABNORMAL HIGH (ref 70–99)

## 2024-01-11 MED ORDER — FLUDEOXYGLUCOSE F - 18 (FDG) INJECTION
7.1000 | Freq: Once | INTRAVENOUS | Status: AC
  Administered 2024-01-11: 6.75 via INTRAVENOUS

## 2024-01-12 ENCOUNTER — Ambulatory Visit
Admission: RE | Admit: 2024-01-12 | Discharge: 2024-01-12 | Disposition: A | Discharge status: Home / Self Care | Source: Ambulatory Visit | Attending: Urology | Admitting: Urology

## 2024-01-12 DIAGNOSIS — C3432 Malignant neoplasm of lower lobe, left bronchus or lung: Secondary | ICD-10-CM | POA: Diagnosis not present

## 2024-01-12 DIAGNOSIS — Z87891 Personal history of nicotine dependence: Secondary | ICD-10-CM | POA: Diagnosis not present

## 2024-01-12 DIAGNOSIS — C3411 Malignant neoplasm of upper lobe, right bronchus or lung: Secondary | ICD-10-CM

## 2024-01-12 DIAGNOSIS — R911 Solitary pulmonary nodule: Secondary | ICD-10-CM

## 2024-01-12 NOTE — Progress Notes (Addendum)
 Telephone follow up to discuss PET scan results for Right Upper lobe   She completed their radiation on:   05/19/23   Does the patient complain of any of the following: Pain:  0/10 Shortness of breath w/wo exertion: No Cough: No Hemoptysis: No Pain with swallowing: No Swallowing/choking concerns: No Appetite: Good Energy Level: Fair Post radiation skin changes: No  Sister Ms. [name] guardian for Mrs. Oshiro was available for call.  Ms Vanderstelt is a resident in a nursing facility.  Additional comments if applicable: None

## 2024-01-13 ENCOUNTER — Telehealth: Payer: Self-pay

## 2024-01-13 NOTE — Telephone Encounter (Signed)
 RN received message to call Alexandra Lamb in reference to her sister Mrs. Alexandra Lamb about treatment options.  Her question will treatment to left lower lobe be the same as last time with or without biopsy?  She was advised that Yes- treatment would be the same as she has had I the past without biopsy, 3-5 fxs SBRT. Biopsy would only potentially change the treatment plan if this turned out to be a small cell lung cancer instead of a non-small cell lung cancer but it has the radiographic appearance of non-small cell so Dr. Patrcia feels confident in treating with SBRT without tissue if they want to avoid biopsy.   Ms. Alexandra Lamb was appreciative of the information and will let us  know if treatment is an option.

## 2024-01-14 ENCOUNTER — Telehealth: Payer: Self-pay | Admitting: Urology

## 2024-01-14 DIAGNOSIS — R911 Solitary pulmonary nodule: Secondary | ICD-10-CM | POA: Insufficient documentation

## 2024-01-14 NOTE — Telephone Encounter (Signed)
 I attempted to return [name]'s sister, [name]'s call to answer some questions that she had when [name] had called to confirm CT simulation date/time.  She was not available so I left a message on her voicemail requesting that she return my call at her earliest convenience.  [name], MMS, PA-C [place]  [facility] Radiation Oncology Physician Assistant Direct Dial: [phone]  Fax: [phone]

## 2024-01-14 NOTE — Progress Notes (Signed)
 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   Narrative:  I spoke with the patient and her sister, Alexandra Lamb, to conduct her routine scheduled 4 month follow up visit via telephone to spare the patient unnecessary potential exposure in the healthcare setting.  The patient was notified in advance and gave permission to proceed with this visit format.     She tolerated the SBRT very well with only modest fatigue.  Her posttreatment CT chest performed on 09/09/2023 showed a stable appearance of the treated RUL lung nodule but there was a new 1.2 cm nodule in the LLL lung that could be inflammation versus neoplasm so the recommendation was for a short interval follow-up chest CT in 3 months.  She had the repeat CT Chest scan on 12/17/23 and this showed some slight interval enlargement of the spiculated nodule in the LLL lung, measuring 1.5 x 0.9 cm as compared to 1.3 x 0.7 cm on prior scan from 08/2023. The previously treated RUL lung nodule was stable in appearance and there was no evidence of lymphadenopathy or metastatic disease in the chest.  We reviewed these results by telephone on 12/22/23 as well as the recommendation for further evaluation with PET which she was in agreement with. The PET scan was performed 01/11/24 and showed low-level metabolism within an irregular left lower lobe nodule, worrisome for adenocarcinoma but no malignant lymphadenopathy or evidence of disease recurrence in the previously treated right upper lobe lung nodule.   Dr. Patrcia has personally reviewed her CT and PET imaging and feels that this is radiographically convincing for a new early-stage lung cancer.  We discussed this today as well as the option to proceed with bronchoscopy for tissue biopsy versus proceed with empiric SBRT.          On review of systems, obtained from her sister, Alexandra Lamb, the patient has not been complaining of cough, increased shortness of breath, hemoptysis, chest pain, fever or chills. She reports a healthy appetite and overall, is pleased with her progress to date.  ALLERGIES:  is allergic to metformin.  Meds: Current Outpatient Medications  Medication Sig Dispense Refill   Insulin  Pen Needle (PEN NEEDLES) 31G X 6 MM MISC as directed once daily with [place]; Duration: 90 days     TRULICITY 3 MG/0.5ML SOAJ 3 mg Subcutaneous once a week     acetaminophen  (TYLENOL ) 500 MG tablet Take 500 mg by mouth every 6 (six) hours as needed.       amLODipine  (NORVASC ) 2.5 MG tablet Take 2.5 mg by mouth daily.     atenolol  (TENORMIN ) 100 MG tablet Take 100 mg by mouth daily.     Cholecalciferol  (RA VITAMIN D -3) 2000 units CAPS Take 1 capsule (2,000 Units total) by mouth daily. 90 capsule 3   divalproex (DEPAKOTE ER) 500 MG 24 hr tablet 1,500 mg at bedtime.     latanoprost  (XALATAN ) 0.005 % ophthalmic solution Place 1 drop into both  eyes at bedtime.     levothyroxine  (SYNTHROID ) 137 MCG tablet SMARTSIG:1.0 Tablet(s) By Mouth Daily     memantine  (NAMENDA ) 10 MG tablet Take 10 mg by mouth 2 (two) times daily.     metFORMIN (GLUCOPHAGE-XR) 500 MG 24 hr tablet Take 500 mg by mouth daily.     Multiple Vitamins-Minerals (CENTRUM ULTRA WOMENS) TABS as directed Orally once a day for 100 days     OLANZapine  (ZYPREXA ) 15 MG tablet Take 15 mg by mouth at bedtime.     OLANZapine  (ZYPREXA ) 5 MG tablet Take 5 mg by mouth daily.     simvastatin  (ZOCOR ) 20 MG tablet Take 20 mg by mouth daily.     valsartan -hydrochlorothiazide  (DIOVAN -HCT) 320-25 MG tablet  SMARTSIG:1.0 Tablet(s) By Mouth Daily     No current facility-administered medications for this encounter.    Physical Findings:  vitals were not taken for this visit.   /10 Unable to assess due to telephone follow-up visit format.  Lab Findings: Lab Results  Component Value Date   WBC 8.8 03/29/2023   HGB 13.4 03/29/2023   HCT 39.4 03/29/2023   MCV 84.4 03/29/2023   PLT 187 03/29/2023     Radiographic Findings: NM PET Image Restag (PS) Skull Base To Thigh Result Date: 01/12/2024 CLINICAL DATA:  Subsequent treatment strategy for lung cancer. EXAM: NUCLEAR MEDICINE PET SKULL BASE TO THIGH TECHNIQUE: 6.8 mCi F-18 FDG was injected intravenously. Full-ring PET imaging was performed from the skull base to thigh after the radiotracer. CT data was obtained and used for attenuation correction and anatomic localization. Fasting blood glucose: 166 mg/dl COMPARISON:  CT chest 91/70/7974 and PET 03/12/2023. FINDINGS: Mediastinal blood pool activity: SUV max 2.8 Liver activity: SUV max NA NECK: No abnormal hypermetabolism. Incidental CT findings: None. CHEST: Apical segment right upper lobe nodule, distorted by motion, better measured and evaluated on CT chest 12/17/2023, SUV max 2.2, decreased from 3.7 on 03/12/2023. Irregular left lower lobe nodule, also distorted by respiratory motion and better measured on 12/17/2023, SUV max 2.2. No additional abnormal hypermetabolism. Incidental CT findings: Atherosclerotic calcification of the aorta, aortic valve and coronary arteries. Enlarged pulmonic trunk and heart. No pericardial or pleural effusion. Surgical clips in the left axilla with postoperative changes in the left breast. ABDOMEN/PELVIS: No abnormal hypermetabolism. Incidental CT findings: Bilateral adrenal adenomas and probable renal sinus cysts. No specific follow-up necessary. Probable uterine fibroid. SKELETON: No abnormal hypermetabolism. Incidental CT findings: Degenerative changes in the spine.  IMPRESSION: 1. Low level metabolism within an irregular left lower lobe nodule, worrisome for adenocarcinoma. 2. Low level metabolism within a treated apical segment right upper lobe nodule, decreased from prior. 3. Bilateral adrenal adenomas. 4. Aortic atherosclerosis (ICD10-I70.0). Coronary artery calcification. 5. Enlarged pulmonic trunk, indicative of pulmonary arterial hypertension. Electronically Signed   By: Newell Eke M.D.   On: 01/12/2024 08:47   CT Chest Wo Contrast Result Date: 12/22/2023 CLINICAL DATA:  Non-small cell lung cancer restaging * Tracking Code: BO * EXAM: CT CHEST WITHOUT CONTRAST TECHNIQUE: Multidetector CT imaging of the chest was performed following the standard protocol without IV contrast. RADIATION DOSE REDUCTION: This exam was performed according to the departmental dose-optimization program which includes automated exposure control, adjustment of the mA and/or kV according to patient size and/or use of iterative reconstruction technique. COMPARISON:  09/09/2023 FINDINGS: Cardiovascular: Aortic atherosclerosis. Normal heart size. Three-vessel coronary artery calcifications. No pericardial effusion. Mediastinum/Nodes: No enlarged mediastinal, hilar, or axillary lymph nodes. Thyroid  gland, trachea, and esophagus demonstrate no significant  findings. Lungs/Pleura: Suspect slight interval enlargement of a spiculated nodule in the left lower lobe, measuring 1.5 x 0.9 cm, previously 1.3 x 0.7 cm when measured similarly (series 5, image 86). No significant change in size of a spiculated nodule in the anterior right apex measuring 1.2 x 0.7 cm and remeasured with similar technique (series 5, image 28). Slight interval increase in adjacent ground-glass. Other tiny nodules unchanged. Background of mild centrilobular and paraseptal emphysema. No pleural effusion or pneumothorax. Upper Abdomen: No acute abnormality. Benign adenomatous thickening of the bilateral adrenal glands.  Musculoskeletal: Status post left mastectomy. No acute osseous findings. IMPRESSION: 1. Suspect slight interval enlargement of a spiculated nodule in the left lower lobe, measuring 1.5 x 0.9 cm, previously 1.3 x 0.7 cm when measured similarly. This is highly suspicious for multifocal adenocarcinoma. 2. No significant change in size of a spiculated nodule in the anterior right apex. Slight interval increase in adjacent ground-glass, consistent with developing radiation pneumonitis. 3. No evidence of lymphadenopathy or metastatic disease in the chest. 4. Emphysema. 5. Coronary artery disease. Aortic Atherosclerosis (ICD10-I70.0) and Emphysema (ICD10-J43.9). Electronically Signed   By: Marolyn JONETTA Jaksch M.D.   On: 12/22/2023 07:07    Impression/Plan: 1. 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 She appears to have recovered well from the effects of previous SBRT to the RUL lung nodule.  We reviewed the results and recommendations of her recent PET imaging for further evaluation of the enlarging LLL lung nodule and they would like to take some time to consider whether they prefer to have bronchoscopy for tissue confirmation versus avoiding a surgical procedure and treating empirically since the radiographic appearance and behavior is characteristic of a small, early stage NSCLC,  They have our contact information and know that they are welcome to call with any further questions or concerns and/or once they reach a decision so that we can move forward with treatment planning accordingly. I enjoyed meeting with them again today and look forward to continuing to participate in her care.  I personally spent 30 minutes in this encounter including chart review, reviewing radiological studies, telephone conversation with the patient and her sister, Alexandra Lamb, entering orders, coordinating care and completing documentation.    Alexandra MICAEL Rusk, PA-C

## 2024-01-19 DIAGNOSIS — F209 Schizophrenia, unspecified: Secondary | ICD-10-CM | POA: Diagnosis not present

## 2024-01-19 DIAGNOSIS — E1169 Type 2 diabetes mellitus with other specified complication: Secondary | ICD-10-CM | POA: Diagnosis not present

## 2024-01-19 DIAGNOSIS — Z0000 Encounter for general adult medical examination without abnormal findings: Secondary | ICD-10-CM | POA: Diagnosis not present

## 2024-01-19 DIAGNOSIS — M8588 Other specified disorders of bone density and structure, other site: Secondary | ICD-10-CM | POA: Diagnosis not present

## 2024-01-19 DIAGNOSIS — E278 Other specified disorders of adrenal gland: Secondary | ICD-10-CM | POA: Diagnosis not present

## 2024-01-19 DIAGNOSIS — I10 Essential (primary) hypertension: Secondary | ICD-10-CM | POA: Diagnosis not present

## 2024-01-19 DIAGNOSIS — E039 Hypothyroidism, unspecified: Secondary | ICD-10-CM | POA: Diagnosis not present

## 2024-01-19 DIAGNOSIS — E785 Hyperlipidemia, unspecified: Secondary | ICD-10-CM | POA: Diagnosis not present

## 2024-01-19 DIAGNOSIS — Z853 Personal history of malignant neoplasm of breast: Secondary | ICD-10-CM | POA: Diagnosis not present

## 2024-01-19 DIAGNOSIS — Z23 Encounter for immunization: Secondary | ICD-10-CM | POA: Diagnosis not present

## 2024-01-19 DIAGNOSIS — Z85118 Personal history of other malignant neoplasm of bronchus and lung: Secondary | ICD-10-CM | POA: Diagnosis not present

## 2024-01-19 DIAGNOSIS — D329 Benign neoplasm of meninges, unspecified: Secondary | ICD-10-CM | POA: Diagnosis not present

## 2024-01-24 DIAGNOSIS — H401131 Primary open-angle glaucoma, bilateral, mild stage: Secondary | ICD-10-CM | POA: Diagnosis not present

## 2024-01-24 DIAGNOSIS — H2512 Age-related nuclear cataract, left eye: Secondary | ICD-10-CM | POA: Diagnosis not present

## 2024-01-27 ENCOUNTER — Ambulatory Visit: Admitting: Radiation Oncology

## 2024-01-31 DIAGNOSIS — Z87891 Personal history of nicotine dependence: Secondary | ICD-10-CM | POA: Diagnosis not present

## 2024-01-31 DIAGNOSIS — C3432 Malignant neoplasm of lower lobe, left bronchus or lung: Secondary | ICD-10-CM | POA: Insufficient documentation

## 2024-01-31 NOTE — Progress Notes (Signed)
  Radiation Oncology         [phone] ________________________________  Name: JANACE DECKER MRN: [id number]  Date: 02/04/2024  DOB: September 21, 1945  STEREOTACTIC BODY RADIOTHERAPY SIMULATION AND TREATMENT PLANNING NOTE    ICD-10-CM   1. Primary cancer of left lower lobe of lung (HCC)  C34.32       DIAGNOSIS:  78 yo woman with a new Stage I LLL primary NSCLC, putative  NARRATIVE:  The patient was brought to the CT Simulation planning suite.  Identity was confirmed.  All relevant records and images related to the planned course of therapy were reviewed.  The patient freely provided informed written consent to proceed with treatment after reviewing the details related to the planned course of therapy. The consent form was witnessed and verified by the simulation staff.  Then, the patient was set-up in a stable reproducible  supine position for radiation therapy.  A BodyFix immobilization pillow was fabricated for reproducible positioning.  Then I personally applied the abdominal compression paddle to limit respiratory excursion.  4D respiratoy motion management CT images were obtained.  Surface markings were placed.  The CT images were loaded into the planning software.  Then, using Cine, MIP, and standard views, the internal target volume (ITV) and planning target volumes (PTV) were delinieated, and avoidance structures were contoured.  Treatment planning then occurred.  The radiation prescription was entered and confirmed.  A total of two complex treatment devices were fabricated in the form of the BodyFix immobilization pillow and a neck accuform cushion.  I have requested : 3D Simulation  I have requested a DVH of the following structures: Heart, Lungs, Esophagus, Chest Wall, Brachial Plexus, Major Blood Vessels, and targets.  SPECIAL TREATMENT PROCEDURE:  The planned course of therapy using radiation constitutes a special treatment procedure. Special care is required in the management of this  patient for the following reasons. This treatment constitutes a Special Treatment Procedure for the following reason: [ High dose per fraction requiring special monitoring for increased toxicities of treatment including daily imaging..  The special nature of the planned course of radiotherapy will require increased physician supervision and oversight to ensure patient's safety with optimal treatment outcomes.  This requires extended time and effort.    RESPIRATORY MOTION MANAGEMENT SIMULATION:  In order to account for effect of respiratory motion on target structures and other organs in the planning and delivery of radiotherapy, this patient underwent respiratory motion management simulation.  To accomplish this, when the patient was brought to the CT simulation planning suite, 4D respiratoy motion management CT images were obtained.  The CT images were loaded into the planning software.  Then, using a variety of tools including Cine, MIP, and standard views, the target volume and planning target volumes (PTV) were delineated.  Avoidance structures were contoured.  Treatment planning then occurred.  Dose volume histograms were generated and reviewed for each of the requested structure.  The resulting plan was carefully reviewed and approved today.  PLAN:  The patient will receive 54 Gy in 3 fractions.  ________________________________  Donnice FELIX Patrcia, M.D.

## 2024-02-02 ENCOUNTER — Telehealth: Payer: Self-pay

## 2024-02-02 NOTE — Telephone Encounter (Signed)
 RN returned call Alexandra Lamb was calling on behalf of her sister Alexandra Lamb.  She had some questions about an upcoming CT sim appointment 02/04/2024.  RN was unable to get Ms. Lamb on the phone left voicemail with CT sim direct number so she can address her questions about this appointment on 02/04/2024.

## 2024-02-04 ENCOUNTER — Ambulatory Visit
Admission: RE | Admit: 2024-02-04 | Discharge: 2024-02-04 | Disposition: A | Discharge status: Home / Self Care | Source: Ambulatory Visit | Attending: Radiation Oncology | Admitting: Radiation Oncology

## 2024-02-04 DIAGNOSIS — C3432 Malignant neoplasm of lower lobe, left bronchus or lung: Secondary | ICD-10-CM | POA: Insufficient documentation

## 2024-02-04 DIAGNOSIS — Z87891 Personal history of nicotine dependence: Secondary | ICD-10-CM | POA: Diagnosis not present

## 2024-02-09 DIAGNOSIS — C3432 Malignant neoplasm of lower lobe, left bronchus or lung: Secondary | ICD-10-CM | POA: Diagnosis not present

## 2024-02-09 DIAGNOSIS — Z87891 Personal history of nicotine dependence: Secondary | ICD-10-CM | POA: Diagnosis not present

## 2024-02-10 ENCOUNTER — Telehealth: Payer: Self-pay | Admitting: Radiation Oncology

## 2024-02-10 NOTE — Telephone Encounter (Signed)
 10/23 Received voicemail from patient's sister with questions about patient's treatment appt.  Email forward to Support RTT and L1 machine, so they are aware.

## 2024-02-14 ENCOUNTER — Ambulatory Visit

## 2024-02-14 NOTE — Addendum Note (Signed)
 Encounter addended by: Irven Gauze E, [place] on: 02/14/2024 9:26 PM  Actions taken: Imaging Exam ended

## 2024-02-15 ENCOUNTER — Other Ambulatory Visit: Payer: Self-pay

## 2024-02-15 ENCOUNTER — Ambulatory Visit

## 2024-02-15 ENCOUNTER — Ambulatory Visit
Admission: RE | Admit: 2024-02-15 | Discharge: 2024-02-15 | Disposition: A | Source: Ambulatory Visit | Attending: Radiation Oncology

## 2024-02-15 DIAGNOSIS — C3432 Malignant neoplasm of lower lobe, left bronchus or lung: Secondary | ICD-10-CM | POA: Diagnosis not present

## 2024-02-15 LAB — RAD ONC ARIA SESSION SUMMARY
Course Elapsed Days: 0
Plan Fractions Treated to Date: 1
Plan Prescribed Dose Per Fraction: 18 Gy
Plan Total Fractions Prescribed: 3
Plan Total Prescribed Dose: 54 Gy
Reference Point Dosage Given to Date: 18 Gy
Reference Point Session Dosage Given: 18 Gy
Session Number: 1

## 2024-02-16 ENCOUNTER — Ambulatory Visit

## 2024-02-17 ENCOUNTER — Ambulatory Visit

## 2024-02-18 ENCOUNTER — Other Ambulatory Visit: Payer: Self-pay

## 2024-02-18 ENCOUNTER — Ambulatory Visit

## 2024-02-18 ENCOUNTER — Ambulatory Visit
Admission: RE | Admit: 2024-02-18 | Discharge: 2024-02-18 | Disposition: A | Discharge status: Home / Self Care | Source: Ambulatory Visit | Attending: Radiation Oncology | Admitting: Radiation Oncology

## 2024-02-18 DIAGNOSIS — C3432 Malignant neoplasm of lower lobe, left bronchus or lung: Secondary | ICD-10-CM | POA: Diagnosis not present

## 2024-02-18 LAB — RAD ONC ARIA SESSION SUMMARY
Course Elapsed Days: 3
Plan Fractions Treated to Date: 2
Plan Prescribed Dose Per Fraction: 18 Gy
Plan Total Fractions Prescribed: 3
Plan Total Prescribed Dose: 54 Gy
Reference Point Dosage Given to Date: 36 Gy
Reference Point Session Dosage Given: 18 Gy
Session Number: 2

## 2024-02-22 ENCOUNTER — Ambulatory Visit
Admission: RE | Admit: 2024-02-22 | Discharge: 2024-02-22 | Disposition: A | Discharge status: Home / Self Care | Source: Ambulatory Visit | Attending: Radiation Oncology | Admitting: Radiation Oncology

## 2024-02-22 ENCOUNTER — Other Ambulatory Visit: Payer: Self-pay

## 2024-02-22 DIAGNOSIS — C3432 Malignant neoplasm of lower lobe, left bronchus or lung: Secondary | ICD-10-CM | POA: Diagnosis present

## 2024-02-22 DIAGNOSIS — Z87891 Personal history of nicotine dependence: Secondary | ICD-10-CM | POA: Diagnosis not present

## 2024-02-22 DIAGNOSIS — Z510 Encounter for antineoplastic radiation therapy: Secondary | ICD-10-CM | POA: Diagnosis not present

## 2024-02-22 LAB — RAD ONC ARIA SESSION SUMMARY
Course Elapsed Days: 7
Plan Fractions Treated to Date: 3
Plan Prescribed Dose Per Fraction: 18 Gy
Plan Total Fractions Prescribed: 3
Plan Total Prescribed Dose: 54 Gy
Reference Point Dosage Given to Date: 54 Gy
Reference Point Session Dosage Given: 18 Gy
Session Number: 3

## 2024-02-23 NOTE — Radiation Completion Notes (Signed)
 Patient Name: Alexandra Lamb, Alexandra Lamb MRN: [id number] Date of Birth: 1945/04/21 Referring Physician: ADINE GIFT, M.D. Date of Service: 2024-02-23 Radiation Oncologist: Adina Barge, M.D. [facility]                             RADIATION ONCOLOGY END OF TREATMENT NOTE     Diagnosis: C34.11 Malignant neoplasm of upper lobe, right bronchus or lung Staging on 2013-06-13: Breast cancer of upper-outer quadrant of left female breast (HCC) T=T2, N=N0, M=cM0 Staging on 2013-06-13: Breast cancer of upper-outer quadrant of left female breast (HCC) T=T2, N=N0, M=cM0 Staging on 2023-04-19: Primary cancer of right upper lobe of lung (HCC) T=cT1b, N=cN0, M=cM0 Intent: Curative     ==========DELIVERED PLANS==========  First Treatment Date: 2024-02-15 Last Treatment Date: 2024-02-22   Plan Name: Lung_LLL_SBRT Site: Lung, Left Technique: SBRT/SRT-IMRT Mode: Photon Dose Per Fraction: 18 Gy Prescribed Dose (Delivered / Prescribed): 54 Gy / 54 Gy Prescribed Fxs (Delivered / Prescribed): 3 / 3     ==========ON TREATMENT VISIT DATES========== 2024-02-15, 2024-02-18, 2024-02-22, 2024-02-22     ==========UPCOMING VISITS==========       ==========APPENDIX - ON TREATMENT VISIT NOTES==========   See weekly On Treatment Notes in Epic for details in the Media tab (listed as Progress notes on the On Treatment Visit Dates listed above).

## 2024-02-24 ENCOUNTER — Ambulatory Visit

## 2024-02-28 DIAGNOSIS — R234 Changes in skin texture: Secondary | ICD-10-CM | POA: Diagnosis not present

## 2024-02-28 DIAGNOSIS — B351 Tinea unguium: Secondary | ICD-10-CM | POA: Diagnosis not present

## 2024-02-28 DIAGNOSIS — R600 Localized edema: Secondary | ICD-10-CM | POA: Diagnosis not present

## 2024-02-28 DIAGNOSIS — M79671 Pain in right foot: Secondary | ICD-10-CM | POA: Diagnosis not present

## 2024-02-28 DIAGNOSIS — I872 Venous insufficiency (chronic) (peripheral): Secondary | ICD-10-CM | POA: Diagnosis not present

## 2024-02-28 DIAGNOSIS — L600 Ingrowing nail: Secondary | ICD-10-CM | POA: Diagnosis not present

## 2024-02-28 DIAGNOSIS — M79672 Pain in left foot: Secondary | ICD-10-CM | POA: Diagnosis not present

## 2024-02-28 DIAGNOSIS — E1140 Type 2 diabetes mellitus with diabetic neuropathy, unspecified: Secondary | ICD-10-CM | POA: Diagnosis not present

## 2024-02-28 DIAGNOSIS — L608 Other nail disorders: Secondary | ICD-10-CM | POA: Diagnosis not present

## 2024-03-07 DIAGNOSIS — Z23 Encounter for immunization: Secondary | ICD-10-CM | POA: Diagnosis not present

## 2024-05-01 ENCOUNTER — Telehealth: Payer: Self-pay | Admitting: *Deleted

## 2024-05-01 ENCOUNTER — Other Ambulatory Visit: Payer: Self-pay | Admitting: Urology

## 2024-05-01 DIAGNOSIS — C3432 Malignant neoplasm of lower lobe, left bronchus or lung: Secondary | ICD-10-CM

## 2024-05-01 DIAGNOSIS — C3411 Malignant neoplasm of upper lobe, right bronchus or lung: Secondary | ICD-10-CM

## 2024-05-01 NOTE — Telephone Encounter (Signed)
 CALLED PATIENT'S SISTER- [name] CARTER TO INFORM OF CT FOR 06/01/24- ARRIVAL TIME- 1 PM @ [facility], NO RESTRICTIONS TO SCAN, PATIENT'S SISTER- [name] CARTER TO RECEIVE RESULTS FROM ASHLYN BRUNING ON 06/07/24 @ 11 AM VIA TELEPHONE, SPOKE WITH PATIENT'S SISTER [name] CARTER AND SHE IS AWARE OF THESE APPTS. AND THE INSTRUCTIONS

## 2024-05-13 ENCOUNTER — Emergency Department (HOSPITAL_COMMUNITY)

## 2024-05-13 ENCOUNTER — Other Ambulatory Visit: Payer: Self-pay

## 2024-05-13 ENCOUNTER — Emergency Department (HOSPITAL_COMMUNITY)
Admission: EM | Admit: 2024-05-13 | Discharge: 2024-05-13 | Disposition: A | Discharge status: Home / Self Care | Attending: Emergency Medicine | Admitting: Emergency Medicine

## 2024-05-13 ENCOUNTER — Encounter (HOSPITAL_COMMUNITY): Payer: Self-pay | Admitting: Emergency Medicine

## 2024-05-13 DIAGNOSIS — M25552 Pain in left hip: Secondary | ICD-10-CM | POA: Diagnosis not present

## 2024-05-13 DIAGNOSIS — M549 Dorsalgia, unspecified: Secondary | ICD-10-CM | POA: Insufficient documentation

## 2024-05-13 DIAGNOSIS — S065XAA Traumatic subdural hemorrhage with loss of consciousness status unknown, initial encounter: Secondary | ICD-10-CM

## 2024-05-13 DIAGNOSIS — W19XXXA Unspecified fall, initial encounter: Secondary | ICD-10-CM

## 2024-05-13 DIAGNOSIS — E871 Hypo-osmolality and hyponatremia: Secondary | ICD-10-CM | POA: Insufficient documentation

## 2024-05-13 DIAGNOSIS — W01198A Fall on same level from slipping, tripping and stumbling with subsequent striking against other object, initial encounter: Secondary | ICD-10-CM | POA: Insufficient documentation

## 2024-05-13 DIAGNOSIS — R079 Chest pain, unspecified: Secondary | ICD-10-CM | POA: Diagnosis present

## 2024-05-13 DIAGNOSIS — I509 Heart failure, unspecified: Secondary | ICD-10-CM | POA: Insufficient documentation

## 2024-05-13 DIAGNOSIS — I110 Hypertensive heart disease with heart failure: Secondary | ICD-10-CM | POA: Diagnosis not present

## 2024-05-13 DIAGNOSIS — Z79899 Other long term (current) drug therapy: Secondary | ICD-10-CM | POA: Diagnosis not present

## 2024-05-13 DIAGNOSIS — Z85118 Personal history of other malignant neoplasm of bronchus and lung: Secondary | ICD-10-CM | POA: Insufficient documentation

## 2024-05-13 DIAGNOSIS — S0990XA Unspecified injury of head, initial encounter: Secondary | ICD-10-CM | POA: Diagnosis present

## 2024-05-13 DIAGNOSIS — S065X0A Traumatic subdural hemorrhage without loss of consciousness, initial encounter: Secondary | ICD-10-CM | POA: Insufficient documentation

## 2024-05-13 DIAGNOSIS — M25559 Pain in unspecified hip: Secondary | ICD-10-CM | POA: Insufficient documentation

## 2024-05-13 DIAGNOSIS — M25551 Pain in right hip: Secondary | ICD-10-CM | POA: Diagnosis not present

## 2024-05-13 LAB — COMPREHENSIVE METABOLIC PANEL WITH GFR
ALT: 15 U/L (ref 0–44)
AST: 22 U/L (ref 15–41)
Albumin: 4.3 g/dL (ref 3.5–5.0)
Alkaline Phosphatase: 79 U/L (ref 38–126)
Anion gap: 13 (ref 5–15)
BUN: 16 mg/dL (ref 8–23)
CO2: 28 mmol/L (ref 22–32)
Calcium: 9.7 mg/dL (ref 8.9–10.3)
Chloride: 88 mmol/L — ABNORMAL LOW (ref 98–111)
Creatinine, Ser: 0.71 mg/dL (ref 0.44–1.00)
GFR, Estimated: >60 mL/min
Glucose, Bld: 169 mg/dL — ABNORMAL HIGH (ref 70–99)
Potassium: 4.2 mmol/L (ref 3.5–5.1)
Sodium: 129 mmol/L — ABNORMAL LOW (ref 135–145)
Total Bilirubin: 0.3 mg/dL (ref 0.0–1.2)
Total Protein: 7.3 g/dL (ref 6.5–8.1)

## 2024-05-13 LAB — CBC WITH DIFFERENTIAL/PLATELET
Abs Immature Granulocytes: 0.07 10*3/uL (ref 0.00–0.07)
Basophils Absolute: 0 10*3/uL (ref 0.0–0.1)
Basophils Relative: 0 %
Eosinophils Absolute: 0.1 10*3/uL (ref 0.0–0.5)
Eosinophils Relative: 1 %
HCT: 42.3 % (ref 36.0–46.0)
Hemoglobin: 14.1 g/dL (ref 12.0–15.0)
Immature Granulocytes: 1 %
Lymphocytes Relative: 13 %
Lymphs Abs: 0.9 10*3/uL (ref 0.7–4.0)
MCH: 28.3 pg (ref 26.0–34.0)
MCHC: 33.3 g/dL (ref 30.0–36.0)
MCV: 84.9 fL (ref 80.0–100.0)
Monocytes Absolute: 0.5 10*3/uL (ref 0.1–1.0)
Monocytes Relative: 7 %
Neutro Abs: 5.6 10*3/uL (ref 1.7–7.7)
Neutrophils Relative %: 78 %
Platelets: 207 10*3/uL (ref 150–400)
RBC: 4.98 MIL/uL (ref 3.87–5.11)
RDW: 12.7 % (ref 11.5–15.5)
WBC: 7.1 10*3/uL (ref 4.0–10.5)
nRBC: 0 % (ref 0.0–0.2)

## 2024-05-13 LAB — URINALYSIS, ROUTINE W REFLEX MICROSCOPIC
Bacteria, UA: NONE SEEN
Bilirubin Urine: NEGATIVE
Glucose, UA: NEGATIVE mg/dL
Hgb urine dipstick: NEGATIVE
Ketones, ur: NEGATIVE mg/dL
Nitrite: NEGATIVE
Protein, ur: NEGATIVE mg/dL
Specific Gravity, Urine: 1.006 (ref 1.005–1.030)
pH: 7 (ref 5.0–8.0)

## 2024-05-13 LAB — CBG MONITORING, ED: Glucose-Capillary: 152 mg/dL — ABNORMAL HIGH (ref 70–99)

## 2024-05-13 NOTE — ED Triage Notes (Signed)
 Patient presents from [facility] due to a fall. While using her walker, she lost her balance and fell backward hitting her head. She did not lose consciousness and is not on a blood thinner. The patient does complain of back and hip pain.    EMS vitals: 166/92 BP 188 CBG 81 HR 16 RR 97% SPO2 on room air

## 2024-05-13 NOTE — ED Notes (Signed)
 New legal guardian- [name] (info in the chart)

## 2024-05-13 NOTE — ED Provider Notes (Signed)
 Received patient in signout from previous provider pending 6-hour observation following observing subdural hematoma on CT head imaging today.  See their note.  In short, patient presents department from [facility] via EMS for evaluation of head injury, hip pain following a fall.  Labs notable for mild hyponatremia 129.  CBG 169.  UA without infection nor Hgb.  No leukocytosis.  CT head notable for Small acute right temporoparietal convexity subdural hematoma measuring up to 3 mm. Left parietal scalp soft tissue swelling. CT cervical spine notable for  Abnormal soft tissue in the right lung apex and anterior right upper lobe, suspicious for intrathoracic malignancy. Findings have worsened since 12/17/23.  CT chest notable for  Somewhat poorly marginated nodular opacities laterally at the left lung base, increased in size and number since prior exam. Progressive 4.5 cm linear opacity in the anterior right upper lobe  Will have patient follow up withher oncologist Dr. Patrcia with results. 06/01/24  Physical Exam Vitals and nursing note reviewed.  Constitutional:      Appearance: Normal appearance.  Eyes:     General: Lids are normal. Vision grossly intact.     Extraocular Movements:     Right eye: Normal extraocular motion and no nystagmus.     Left eye: Normal extraocular motion and no nystagmus.  Neurological:     Mental Status: She is alert and oriented to person, place, and time. Mental status is at baseline.     Cranial Nerves: No cranial nerve deficit.     Sensory: No sensory deficit.     Motor: No weakness.     Coordination: Coordination normal.     Gait: Gait normal.    Consulted neurosurgery Dr. Orlean regarding subdural hematoma who has no further recommendations and if stable can be DC home as long as patient remain neurovascularly stable following obs period  Continues to remain vasc stable throughout 6 hr obs period in ED. No AMS  Called legal guardian and patient's  sister regarding ED workup, disposition via telephone. Confirmed patient demographics with 2 identifiers per HIPAA. Answered all patient and family questions.  Discussed with legal guardian and sister that patient can follow-up with CT chest results with oncologist  Patient stable for DC. Pt's sister will visit patient tomorrow in facility  Discussed ED workup, disposition, return to ED precautions with patient who expresses understanding agrees with plan.  All questions answered to their satisfaction.  They are agreeable to plan.  Discharge instructions provided on paperwork    Minnie Tinnie BRAVO, PA 05/14/24 1643    Freddi Hamilton, MD 05/14/24 2216

## 2024-05-13 NOTE — Discharge Instructions (Signed)
 Thank you for letting us  evaluate you today.  You sustained a small bleed in the subdural region of your brain.  We have observed you for 6 hours following and you have not had any acute neurological deficits.  I have spoke to neurosurgery team and they are reassured that you continue to remain neurologically intact and are agreeable to discharge at this time.  Your labs are within normal limits.  Your urine did not show any UTI.  Return to Emergency Department if you experience altered mentation, loss of consciousness, seizure-like activity, head injury, worsening symptoms

## 2024-05-13 NOTE — ED Notes (Signed)
 PA spoke with both Niels and Jenkins about the plan for the patient. RN called both Niels and Jenkins as well. Niels did not pick up when RN called, but Jenkins was made aware of the patient's discharge.

## 2024-05-13 NOTE — ED Provider Notes (Signed)
 " [place] [facility] AT [facility] Provider Note   CSN: [id number] Arrival date & time: 05/13/24  1226     Patient presents with: Alexandra Lamb is a 79 y.o. female. presents from [place] via EMS to the ER after a fall.  Patient was using a walker when she lost her balance and fell backward hitting her head.  Denies LOC.  Patient is not on any blood thinners.  She does not complain of any pain at this time.  Patient did have hip pain to EMS initially.  Denies any lacerations.  No alcohol or drug use.  Denies wrist pain, back pain, neck pain, shoulder pain, ankle pain, vision disturbance, confusion, fever, cough, chills, chest pain, shortness of breath, nausea, vomiting, abdominal pain, diarrhea, weakness, numbness or tingling, headache, dizziness, or any other symptoms at this time.      Fall       Prior to Admission medications  Medication Sig Start Date End Date Taking? Authorizing Provider  acetaminophen  (TYLENOL ) 500 MG tablet Take 500 mg by mouth every 6 (six) hours as needed.      [provider]  amLODipine  (NORVASC ) 2.5 MG tablet Take 2.5 mg by mouth daily.    [provider]  atenolol  (TENORMIN ) 100 MG tablet Take 100 mg by mouth daily.    [provider]  Cholecalciferol  (RA VITAMIN D -3) 2000 units CAPS Take 1 capsule (2,000 Units total) by mouth daily. 12/21/17   Gudena, Vinay, MD  divalproex (DEPAKOTE ER) 500 MG 24 hr tablet 1,500 mg at bedtime. 09/28/19   [provider]  Insulin  Pen Needle (PEN NEEDLES) 31G X 6 MM MISC as directed once daily with Tresiba; Duration: 90 days 07/09/22   [provider]  latanoprost  (XALATAN ) 0.005 % ophthalmic solution Place 1 drop into both eyes at bedtime.    [provider]  levothyroxine  (SYNTHROID ) 137 MCG tablet SMARTSIG:1.0 Tablet(s) By Mouth Daily 03/09/23   [provider]  memantine  (NAMENDA ) 10 MG tablet Take 10 mg by mouth 2 (two) times  daily.    [provider]  metFORMIN (GLUCOPHAGE-XR) 500 MG 24 hr tablet Take 500 mg by mouth daily.    [provider]  Multiple Vitamins-Minerals (CENTRUM ULTRA WOMENS) TABS as directed Orally once a day for 100 days    [provider]  OLANZapine  (ZYPREXA ) 15 MG tablet Take 15 mg by mouth at bedtime.    [provider]  OLANZapine  (ZYPREXA ) 5 MG tablet Take 5 mg by mouth daily. 04/29/23   [provider]  simvastatin  (ZOCOR ) 20 MG tablet Take 20 mg by mouth daily.    [provider]  TRULICITY 3 MG/0.5ML SOAJ 3 mg Subcutaneous once a week 05/13/23   [provider]  valsartan -hydrochlorothiazide  (DIOVAN -HCT) 320-25 MG tablet SMARTSIG:1.0 Tablet(s) By Mouth Daily 03/09/23   [provider]    Allergies: Metformin    Review of Systems  Updated Vital Signs BP (!) 187/75 (BP Location: Right Arm)   Pulse 78   Temp 97.7 F (36.5 C) (Oral)   Resp 17   SpO2 100%   Physical Exam Vitals and nursing note reviewed.  Constitutional:      General: She is not in acute distress.    Appearance: She is well-developed.  HENT:     Head: Normocephalic and atraumatic.  Eyes:     Extraocular Movements: Extraocular movements intact.     Conjunctiva/sclera: Conjunctivae normal.  Pupils: Pupils are equal, round, and reactive to light.  Neck:     Comments: Cervical collar in place. Cardiovascular:     Rate and Rhythm: Normal rate and regular rhythm.     Heart sounds: No murmur heard. Pulmonary:     Effort: Pulmonary effort is normal. No respiratory distress.     Breath sounds: Normal breath sounds.  Abdominal:     Palpations: Abdomen is soft.     Tenderness: There is no abdominal tenderness.  Musculoskeletal:        General: No swelling.     Right shoulder: Normal.     Left shoulder: Normal.     Right upper arm: Normal.     Left upper arm: Normal.     Right elbow: Normal.     Left elbow: Normal.     Right forearm:  Normal.     Left forearm: Normal.     Right wrist: Normal.     Left wrist: Normal.     Right hand: Normal.     Left hand: Normal.     Cervical back: Normal and neck supple.     Thoracic back: Normal.     Lumbar back: Normal.     Right hip: Normal.     Left hip: Normal.     Right upper leg: Normal.     Left upper leg: Normal.     Right knee: Normal.     Left knee: Normal.     Right lower leg: Normal.     Left lower leg: Normal.     Right ankle: Normal.     Left ankle: Normal.     Right foot: Normal.     Left foot: Normal.     Comments: Normal range of motion and no tenderness of all extremities.  No midline tenderness.  Sensation intact.  Pulses palpable bilaterally.  Skin:    General: Skin is warm and dry.     Capillary Refill: Capillary refill takes less than 2 seconds.  Neurological:     General: No focal deficit present.     Mental Status: She is alert and oriented to person, place, and time.     Cranial Nerves: Cranial nerves 2-12 are intact.     Sensory: Sensation is intact.     Motor: Motor function is intact.     Coordination: Coordination is intact.     Comments: Mental Status:  Alert, oriented, thought content appropriate, able to give a coherent history. Speech fluent without evidence of aphasia. Able to follow 2 step commands without difficulty.  Cranial Nerves:  II:  Peripheral visual fields grossly normal, pupils equal, round, reactive to light III,IV, VI: ptosis not present, extra-ocular motions intact bilaterally  V,VII: smile symmetric, facial light touch sensation equal VIII: hearing grossly normal to voice  X: uvula elevates symmetrically  XI: bilateral shoulder shrug symmetric and strong XII: midline tongue extension without fassiculations Motor:  Normal tone. 5/5 in upper and lower extremities bilaterally including strong and equal grip strength and dorsiflexion/plantar flexion Sensory: Pinprick and light touch normal in all extremities.  Deep Tendon  Reflexes: 2+ and symmetric in the biceps and patella Cerebellar: normal finger-to-nose with bilateral upper extremities Gait: normal gait and balance CV: distal pulses palpable throughout    Psychiatric:        Mood and Affect: Mood normal.     (all labs ordered are listed, but only abnormal results are displayed) Labs Reviewed  COMPREHENSIVE METABOLIC PANEL WITH GFR -  Abnormal; Notable for the following components:      Result Value   Sodium 129 (*)    Chloride 88 (*)    Glucose, Bld 169 (*)    All other components within normal limits  URINALYSIS, ROUTINE W REFLEX MICROSCOPIC - Abnormal; Notable for the following components:   Color, Urine STRAW (*)    Leukocytes,Ua TRACE (*)    All other components within normal limits  CBG MONITORING, ED - Abnormal; Notable for the following components:   Glucose-Capillary 152 (*)    All other components within normal limits  CBC WITH DIFFERENTIAL/PLATELET    EKG: None  Radiology:    Procedures   Medications Ordered in the ED - No data to display                                  Medical Decision Making  This patient presents to the ED for concern of head injury. this involves an extensive number of treatment options, and is a complaint that carries with it a high risk of complications and morbidity.  The differential diagnosis includes CVA, ACS, arrhythmia, vasovagal / orthostatic hypotension, sepsis, hypoglycemia, electrolyte disturbance, respiratory failure, anemia, dehydration, heat injury.    Co morbidities / Chronic conditions that complicate the patient evaluation  Hypertension, CHF, lung cancer   Additional history obtained:  Additional history obtained from EMR   Lab Tests:  I Ordered, and personally interpreted labs.  The pertinent results include: Elevated CBG at 152, CBC without any signs of leukocytosis.  Pending labs.   Imaging Studies ordered:  I ordered imaging studies including hip x-ray, CT head and  neck I independently visualized and interpreted imaging which showed a small acute right temporoparietal convexity subdural hematoma measuring up to 3 mm, left parietal scalp soft tissue swelling, and air fluid level in the left maxillary sinus on CT head.  CT cervical spine revealed no acute fractures, worsening abnormal soft tissue in the right lung apex.  Hip x-rays without any acute fractures. I agree with the radiologist interpretation   Cardiac Monitoring: / EKG:  The patient was maintained on a cardiac monitor.  I personally viewed and interpreted the cardiac monitored which showed an underlying rhythm of: Normal sinus   Dispostion: Patient is a 79 year old female with a history of lung cancer presenting today from [place] via EMS after a fall.  She hit her head while losing her balance but denies LOC.  Patient denies any pain at this time.  Not on blood thinners. Patient is alert and oriented with no apparent distress.  Well-appearing and appears comfortable on exam.  Abdomen soft nontender.  Lungs clear.  Heart normal.  Neurologically intact.  Sensation intact.  No tenderness of all extremities.  No midline tenderness.  Cervical collar in place upon initial evaluation.  Hypertensive on evaluation but other vital signs are stable.     CT head revealed small acute right temporoparietal convexity subdural hematoma measuring up to 3 mm, left parietal scalp soft tissue swelling, and air fluid level in the left maxillary sinus.  CT cervical spine revealed no acute fractures but worsening abnormal soft tissue in the right lung apex.  This was personally communicated to me by radiologist, Dr. Perley who recommended CT chest for further evaluation of this. MBIG 1. Per the mBIG criteria, hospital admission is not indicated at this time and patient needs to be observed for 6 hours.  Discussed these findings with the patient. Pending labs and imaging at end of shift.   Case discussed with Dr.  Patsey, MD who assessed the patient and is in agreement with plan.    3:09 PM Care of Alexandra Lamb transferred to [place], PA-C at the end of my shift as the patient will require reassessment once labs/imaging have resulted. Patient presentation, ED course, and plan of care discussed with review of all pertinent labs and imaging. Please see his/her note for further details regarding further ED course and disposition. Plan at time of handoff is observe patient for 6 hours, review labs and imaging.  If negative, discharge patient with close follow-up.  This may be altered or completely changed at the discretion of the oncoming team pending results of further workup.       Final diagnoses:  None    ED Discharge Orders     None          Braxton Dubois, PA-C 05/13/24 1509  "

## 2024-06-01 ENCOUNTER — Ambulatory Visit (HOSPITAL_COMMUNITY)

## 2024-06-07 ENCOUNTER — Ambulatory Visit: Admitting: Urology
